# Patient Record
Sex: Male | Born: 1945 | Race: White | Hispanic: No | Marital: Married | Smoking: Former smoker
Health system: Southern US, Community
[De-identification: ages and names within clinical notes are randomized; demographics above are authoritative.]

## PROBLEM LIST (undated history)

## (undated) DIAGNOSIS — I10 Essential (primary) hypertension: Secondary | ICD-10-CM

## (undated) DIAGNOSIS — R399 Unspecified symptoms and signs involving the genitourinary system: Secondary | ICD-10-CM

## (undated) DIAGNOSIS — K219 Gastro-esophageal reflux disease without esophagitis: Secondary | ICD-10-CM

## (undated) DIAGNOSIS — I480 Paroxysmal atrial fibrillation: Secondary | ICD-10-CM

## (undated) DIAGNOSIS — E119 Type 2 diabetes mellitus without complications: Secondary | ICD-10-CM

## (undated) DIAGNOSIS — J439 Emphysema, unspecified: Secondary | ICD-10-CM

## (undated) DIAGNOSIS — G4733 Obstructive sleep apnea (adult) (pediatric): Secondary | ICD-10-CM

## (undated) DIAGNOSIS — G629 Polyneuropathy, unspecified: Secondary | ICD-10-CM

## (undated) DIAGNOSIS — M545 Low back pain, unspecified: Secondary | ICD-10-CM

## (undated) DIAGNOSIS — R0600 Dyspnea, unspecified: Secondary | ICD-10-CM

## (undated) DIAGNOSIS — I872 Venous insufficiency (chronic) (peripheral): Secondary | ICD-10-CM

## (undated) DIAGNOSIS — J189 Pneumonia, unspecified organism: Secondary | ICD-10-CM

## (undated) DIAGNOSIS — R51 Headache: Secondary | ICD-10-CM

## (undated) DIAGNOSIS — Z87442 Personal history of urinary calculi: Secondary | ICD-10-CM

## (undated) DIAGNOSIS — Z973 Presence of spectacles and contact lenses: Secondary | ICD-10-CM

## (undated) DIAGNOSIS — R079 Chest pain, unspecified: Secondary | ICD-10-CM

## (undated) DIAGNOSIS — G8929 Other chronic pain: Secondary | ICD-10-CM

## (undated) DIAGNOSIS — Z972 Presence of dental prosthetic device (complete) (partial): Secondary | ICD-10-CM

## (undated) DIAGNOSIS — Z8739 Personal history of other diseases of the musculoskeletal system and connective tissue: Secondary | ICD-10-CM

## (undated) DIAGNOSIS — Z7901 Long term (current) use of anticoagulants: Secondary | ICD-10-CM

## (undated) DIAGNOSIS — M069 Rheumatoid arthritis, unspecified: Secondary | ICD-10-CM

## (undated) DIAGNOSIS — R519 Headache, unspecified: Secondary | ICD-10-CM

## (undated) DIAGNOSIS — K449 Diaphragmatic hernia without obstruction or gangrene: Secondary | ICD-10-CM

## (undated) DIAGNOSIS — R0609 Other forms of dyspnea: Secondary | ICD-10-CM

## (undated) DIAGNOSIS — R600 Localized edema: Secondary | ICD-10-CM

## (undated) DIAGNOSIS — Z9989 Dependence on other enabling machines and devices: Secondary | ICD-10-CM

## (undated) HISTORY — PX: VEIN LIGATION AND STRIPPING: SHX2653

## (undated) HISTORY — PX: CYST EXCISION: SHX5701

## (undated) HISTORY — PX: TONSILLECTOMY: SUR1361

## (undated) HISTORY — PX: INGUINAL HERNIA REPAIR: SUR1180

## (undated) HISTORY — DX: Chest pain, unspecified: R07.9

## (undated) HISTORY — PX: PLEURAL SCARIFICATION: SHX748

## (undated) HISTORY — PX: UMBILICAL HERNIA REPAIR: SHX196

## (undated) HISTORY — DX: Long term (current) use of anticoagulants: Z79.01

## (undated) HISTORY — PX: HERNIA REPAIR: SHX51

## (undated) HISTORY — DX: Essential (primary) hypertension: I10

## (undated) HISTORY — PX: ATRIAL FIBRILLATION ABLATION: EP1191

## (undated) HISTORY — DX: Paroxysmal atrial fibrillation: I48.0

## (undated) HISTORY — DX: Venous insufficiency (chronic) (peripheral): I87.2

## (undated) HISTORY — DX: Other chronic pain: G89.29

---

## 1996-07-14 HISTORY — PX: CARDIAC CATHETERIZATION: SHX172

## 1999-03-04 ENCOUNTER — Ambulatory Visit (HOSPITAL_COMMUNITY): Admission: RE | Admit: 1999-03-04 | Discharge: 1999-03-04 | Payer: Self-pay | Admitting: Urology

## 1999-03-04 ENCOUNTER — Encounter: Payer: Self-pay | Admitting: Urology

## 1999-07-26 ENCOUNTER — Ambulatory Visit: Admission: RE | Admit: 1999-07-26 | Discharge: 1999-07-26 | Payer: Self-pay | Admitting: Family Medicine

## 1999-10-24 ENCOUNTER — Ambulatory Visit (HOSPITAL_COMMUNITY): Admission: RE | Admit: 1999-10-24 | Discharge: 1999-10-24 | Payer: Self-pay | Admitting: Gastroenterology

## 1999-10-24 ENCOUNTER — Encounter (INDEPENDENT_AMBULATORY_CARE_PROVIDER_SITE_OTHER): Payer: Self-pay | Admitting: Specialist

## 1999-11-20 ENCOUNTER — Encounter: Admission: RE | Admit: 1999-11-20 | Discharge: 1999-11-20 | Payer: Self-pay | Admitting: Vascular Surgery

## 1999-11-20 ENCOUNTER — Encounter: Payer: Self-pay | Admitting: Vascular Surgery

## 2000-02-06 ENCOUNTER — Ambulatory Visit (HOSPITAL_COMMUNITY): Admission: RE | Admit: 2000-02-06 | Discharge: 2000-02-06 | Payer: Self-pay | Admitting: Surgery

## 2000-02-06 ENCOUNTER — Encounter: Payer: Self-pay | Admitting: Surgery

## 2001-09-07 ENCOUNTER — Ambulatory Visit (HOSPITAL_BASED_OUTPATIENT_CLINIC_OR_DEPARTMENT_OTHER): Admission: RE | Admit: 2001-09-07 | Discharge: 2001-09-07 | Payer: Self-pay | Admitting: Surgery

## 2002-02-24 ENCOUNTER — Encounter: Payer: Self-pay | Admitting: Cardiology

## 2002-02-24 ENCOUNTER — Inpatient Hospital Stay (HOSPITAL_COMMUNITY): Admission: EM | Admit: 2002-02-24 | Discharge: 2002-02-26 | Payer: Self-pay

## 2002-07-27 ENCOUNTER — Inpatient Hospital Stay (HOSPITAL_COMMUNITY): Admission: EM | Admit: 2002-07-27 | Discharge: 2002-07-29 | Payer: Self-pay | Admitting: Emergency Medicine

## 2002-10-20 ENCOUNTER — Encounter: Payer: Self-pay | Admitting: Emergency Medicine

## 2002-10-20 ENCOUNTER — Emergency Department (HOSPITAL_COMMUNITY): Admission: EM | Admit: 2002-10-20 | Discharge: 2002-10-20 | Payer: Self-pay | Admitting: Emergency Medicine

## 2002-10-31 ENCOUNTER — Inpatient Hospital Stay (HOSPITAL_COMMUNITY): Admission: EM | Admit: 2002-10-31 | Discharge: 2002-11-01 | Payer: Self-pay | Admitting: Emergency Medicine

## 2002-10-31 ENCOUNTER — Encounter: Payer: Self-pay | Admitting: *Deleted

## 2003-01-05 ENCOUNTER — Ambulatory Visit (HOSPITAL_BASED_OUTPATIENT_CLINIC_OR_DEPARTMENT_OTHER): Admission: RE | Admit: 2003-01-05 | Discharge: 2003-01-05 | Payer: Self-pay | Admitting: Family Medicine

## 2003-03-03 ENCOUNTER — Ambulatory Visit (HOSPITAL_COMMUNITY): Admission: RE | Admit: 2003-03-03 | Discharge: 2003-03-04 | Payer: Self-pay | Admitting: Internal Medicine

## 2003-05-12 ENCOUNTER — Ambulatory Visit (HOSPITAL_COMMUNITY): Admission: RE | Admit: 2003-05-12 | Discharge: 2003-05-13 | Payer: Self-pay | Admitting: Surgery

## 2003-05-12 ENCOUNTER — Encounter (INDEPENDENT_AMBULATORY_CARE_PROVIDER_SITE_OTHER): Payer: Self-pay | Admitting: *Deleted

## 2003-06-23 ENCOUNTER — Inpatient Hospital Stay (HOSPITAL_COMMUNITY): Admission: EM | Admit: 2003-06-23 | Discharge: 2003-06-24 | Payer: Self-pay | Admitting: Emergency Medicine

## 2003-09-26 ENCOUNTER — Encounter: Admission: RE | Admit: 2003-09-26 | Discharge: 2003-12-25 | Payer: Self-pay | Admitting: Family Medicine

## 2004-08-01 ENCOUNTER — Ambulatory Visit (HOSPITAL_COMMUNITY): Admission: RE | Admit: 2004-08-01 | Discharge: 2004-08-01 | Payer: Self-pay | Admitting: Urology

## 2004-08-01 ENCOUNTER — Ambulatory Visit (HOSPITAL_BASED_OUTPATIENT_CLINIC_OR_DEPARTMENT_OTHER): Admission: RE | Admit: 2004-08-01 | Discharge: 2004-08-01 | Payer: Self-pay | Admitting: Urology

## 2004-08-01 ENCOUNTER — Encounter (INDEPENDENT_AMBULATORY_CARE_PROVIDER_SITE_OTHER): Payer: Self-pay | Admitting: *Deleted

## 2004-08-21 ENCOUNTER — Ambulatory Visit: Payer: Self-pay | Admitting: Oncology

## 2004-09-03 ENCOUNTER — Ambulatory Visit (HOSPITAL_COMMUNITY): Admission: RE | Admit: 2004-09-03 | Discharge: 2004-09-03 | Payer: Self-pay | Admitting: Oncology

## 2004-11-29 ENCOUNTER — Ambulatory Visit: Payer: Self-pay | Admitting: Oncology

## 2004-12-20 ENCOUNTER — Ambulatory Visit (HOSPITAL_COMMUNITY): Admission: RE | Admit: 2004-12-20 | Discharge: 2004-12-20 | Payer: Self-pay | Admitting: General Surgery

## 2004-12-20 ENCOUNTER — Encounter (INDEPENDENT_AMBULATORY_CARE_PROVIDER_SITE_OTHER): Payer: Self-pay | Admitting: General Surgery

## 2004-12-20 ENCOUNTER — Encounter (INDEPENDENT_AMBULATORY_CARE_PROVIDER_SITE_OTHER): Payer: Self-pay | Admitting: Specialist

## 2004-12-31 ENCOUNTER — Ambulatory Visit: Payer: Self-pay | Admitting: Internal Medicine

## 2005-03-06 ENCOUNTER — Encounter: Admission: RE | Admit: 2005-03-06 | Discharge: 2005-04-10 | Payer: Self-pay | Admitting: Family Medicine

## 2005-05-15 ENCOUNTER — Encounter: Admission: RE | Admit: 2005-05-15 | Discharge: 2005-05-15 | Payer: Self-pay | Admitting: Cardiology

## 2005-08-07 ENCOUNTER — Ambulatory Visit (HOSPITAL_COMMUNITY): Admission: RE | Admit: 2005-08-07 | Discharge: 2005-08-07 | Payer: Self-pay | Admitting: Cardiology

## 2005-12-24 ENCOUNTER — Ambulatory Visit: Payer: Self-pay | Admitting: Internal Medicine

## 2006-01-27 ENCOUNTER — Emergency Department (HOSPITAL_COMMUNITY): Admission: EM | Admit: 2006-01-27 | Discharge: 2006-01-27 | Payer: Self-pay | Admitting: Emergency Medicine

## 2006-09-09 ENCOUNTER — Ambulatory Visit: Payer: Self-pay | Admitting: Emergency Medicine

## 2006-09-29 ENCOUNTER — Ambulatory Visit (HOSPITAL_BASED_OUTPATIENT_CLINIC_OR_DEPARTMENT_OTHER): Admission: RE | Admit: 2006-09-29 | Discharge: 2006-09-29 | Payer: Self-pay | Admitting: Emergency Medicine

## 2006-10-16 ENCOUNTER — Ambulatory Visit: Payer: Self-pay | Admitting: Pulmonary Disease

## 2006-10-21 ENCOUNTER — Ambulatory Visit: Payer: Self-pay | Admitting: Emergency Medicine

## 2007-02-05 ENCOUNTER — Ambulatory Visit: Payer: Self-pay | Admitting: Vascular Surgery

## 2007-02-16 ENCOUNTER — Ambulatory Visit: Payer: Self-pay | Admitting: Vascular Surgery

## 2007-03-11 ENCOUNTER — Ambulatory Visit: Payer: Self-pay | Admitting: *Deleted

## 2007-03-22 ENCOUNTER — Ambulatory Visit: Payer: Self-pay | Admitting: Vascular Surgery

## 2007-04-21 ENCOUNTER — Ambulatory Visit: Payer: Self-pay | Admitting: Vascular Surgery

## 2007-05-03 ENCOUNTER — Ambulatory Visit: Payer: Self-pay | Admitting: Vascular Surgery

## 2007-05-11 ENCOUNTER — Ambulatory Visit: Payer: Self-pay | Admitting: Vascular Surgery

## 2007-06-21 ENCOUNTER — Ambulatory Visit: Payer: Self-pay | Admitting: Vascular Surgery

## 2007-06-28 ENCOUNTER — Ambulatory Visit: Payer: Self-pay | Admitting: Vascular Surgery

## 2007-10-26 ENCOUNTER — Ambulatory Visit: Payer: Self-pay | Admitting: Vascular Surgery

## 2007-12-01 ENCOUNTER — Encounter: Admission: RE | Admit: 2007-12-01 | Discharge: 2007-12-01 | Payer: Self-pay | Admitting: Sports Medicine

## 2009-01-09 ENCOUNTER — Other Ambulatory Visit: Payer: Self-pay | Admitting: Emergency Medicine

## 2009-01-09 ENCOUNTER — Inpatient Hospital Stay (HOSPITAL_COMMUNITY): Admission: EM | Admit: 2009-01-09 | Discharge: 2009-01-12 | Payer: Self-pay | Admitting: Internal Medicine

## 2010-02-11 ENCOUNTER — Ambulatory Visit: Admission: RE | Admit: 2010-02-11 | Discharge: 2010-02-11 | Payer: Self-pay | Admitting: Cardiology

## 2010-03-19 ENCOUNTER — Ambulatory Visit: Payer: Self-pay | Admitting: Cardiology

## 2010-04-24 ENCOUNTER — Inpatient Hospital Stay (HOSPITAL_COMMUNITY): Admission: EM | Admit: 2010-04-24 | Discharge: 2010-04-27 | Payer: Self-pay | Admitting: Emergency Medicine

## 2010-04-25 ENCOUNTER — Ambulatory Visit: Payer: Self-pay | Admitting: Vascular Surgery

## 2010-06-24 ENCOUNTER — Inpatient Hospital Stay (HOSPITAL_COMMUNITY)
Admission: EM | Admit: 2010-06-24 | Discharge: 2010-06-27 | Payer: Self-pay | Source: Home / Self Care | Attending: Internal Medicine | Admitting: Internal Medicine

## 2010-09-24 LAB — DIFFERENTIAL
Basophils Absolute: 0.1 10*3/uL (ref 0.0–0.1)
Basophils Relative: 0 % (ref 0–1)
Lymphocytes Relative: 11 % — ABNORMAL LOW (ref 12–46)
Neutro Abs: 11.2 10*3/uL — ABNORMAL HIGH (ref 1.7–7.7)
Neutrophils Relative %: 79 % — ABNORMAL HIGH (ref 43–77)

## 2010-09-24 LAB — CBC
HCT: 36.4 % — ABNORMAL LOW (ref 39.0–52.0)
HCT: 39.9 % (ref 39.0–52.0)
Hemoglobin: 12 g/dL — ABNORMAL LOW (ref 13.0–17.0)
Hemoglobin: 13.2 g/dL (ref 13.0–17.0)
Hemoglobin: 13.3 g/dL (ref 13.0–17.0)
MCHC: 33.1 g/dL (ref 30.0–36.0)
MCHC: 33.1 g/dL (ref 30.0–36.0)
RBC: 4.25 MIL/uL (ref 4.22–5.81)
RDW: 14.6 % (ref 11.5–15.5)
RDW: 14.8 % (ref 11.5–15.5)
WBC: 10.7 10*3/uL — ABNORMAL HIGH (ref 4.0–10.5)
WBC: 14.2 10*3/uL — ABNORMAL HIGH (ref 4.0–10.5)

## 2010-09-24 LAB — BASIC METABOLIC PANEL
BUN: 17 mg/dL (ref 6–23)
Calcium: 9.3 mg/dL (ref 8.4–10.5)
Chloride: 99 mEq/L (ref 96–112)
GFR calc Af Amer: >60 mL/min (ref >60)
GFR calc non Af Amer: 56 mL/min — ABNORMAL LOW (ref >60)
GFR calc non Af Amer: >60 mL/min (ref >60)
GFR calc non Af Amer: >60 mL/min (ref >60)
Glucose, Bld: 114 mg/dL — ABNORMAL HIGH (ref 70–99)
Glucose, Bld: 119 mg/dL — ABNORMAL HIGH (ref 70–99)
Potassium: 3.3 mEq/L — ABNORMAL LOW (ref 3.5–5.1)
Potassium: 3.5 mEq/L (ref 3.5–5.1)
Potassium: 3.9 mEq/L (ref 3.5–5.1)
Sodium: 134 mEq/L — ABNORMAL LOW (ref 135–145)
Sodium: 135 mEq/L (ref 135–145)
Sodium: 136 mEq/L (ref 135–145)

## 2010-09-24 LAB — CULTURE, BLOOD (ROUTINE X 2)
Culture  Setup Time: 201112130431
Culture: NO GROWTH
Culture: NO GROWTH

## 2010-09-25 LAB — CBC
HCT: 36.9 % — ABNORMAL LOW (ref 39.0–52.0)
Hemoglobin: 11.2 g/dL — ABNORMAL LOW (ref 13.0–17.0)
Hemoglobin: 13.8 g/dL (ref 13.0–17.0)
MCH: 28.1 pg (ref 26.0–34.0)
MCH: 28.1 pg (ref 26.0–34.0)
MCH: 28.6 pg (ref 26.0–34.0)
MCH: 28.9 pg (ref 26.0–34.0)
MCHC: 32.1 g/dL (ref 30.0–36.0)
MCHC: 33.1 g/dL (ref 30.0–36.0)
MCHC: 33.2 g/dL (ref 30.0–36.0)
MCV: 87 fL (ref 78.0–100.0)
MCV: 85.9 fL (ref 78.0–100.0)
Platelets: 132 10*3/uL — ABNORMAL LOW (ref 150–400)
Platelets: 141 10*3/uL — ABNORMAL LOW (ref 150–400)
Platelets: 157 10*3/uL (ref 150–400)
RBC: 3.99 MIL/uL — ABNORMAL LOW (ref 4.22–5.81)
RBC: 4.22 MIL/uL (ref 4.22–5.81)
RBC: 4.34 MIL/uL (ref 4.22–5.81)
RBC: 4.82 MIL/uL (ref 4.22–5.81)
RDW: 15.1 % (ref 11.5–15.5)
WBC: 31.3 10*3/uL — ABNORMAL HIGH (ref 4.0–10.5)

## 2010-09-25 LAB — BASIC METABOLIC PANEL
BUN: 19 mg/dL (ref 6–23)
CO2: 24 mEq/L (ref 19–32)
CO2: 26 mEq/L (ref 19–32)
CO2: 27 mEq/L (ref 19–32)
Calcium: 8.4 mg/dL (ref 8.4–10.5)
Calcium: 8.9 mg/dL (ref 8.4–10.5)
Chloride: 100 mEq/L (ref 96–112)
Chloride: 102 mEq/L (ref 96–112)
Creatinine, Ser: 1.17 mg/dL (ref 0.4–1.5)
Creatinine, Ser: 1.24 mg/dL (ref 0.4–1.5)
Creatinine, Ser: 1.31 mg/dL (ref 0.4–1.5)
GFR calc Af Amer: >60 mL/min (ref >60)
GFR calc Af Amer: >60 mL/min (ref >60)
GFR calc Af Amer: >60 mL/min (ref >60)
GFR calc non Af Amer: 55 mL/min — ABNORMAL LOW (ref >60)
GFR calc non Af Amer: 59 mL/min — ABNORMAL LOW (ref >60)
Glucose, Bld: 118 mg/dL — ABNORMAL HIGH (ref 70–99)
Potassium: 3.7 mEq/L (ref 3.5–5.1)
Sodium: 132 mEq/L — ABNORMAL LOW (ref 135–145)
Sodium: 134 mEq/L — ABNORMAL LOW (ref 135–145)

## 2010-09-25 LAB — POCT I-STAT, CHEM 8
Calcium, Ion: 1.06 mmol/L — ABNORMAL LOW (ref 1.12–1.32)
Creatinine, Ser: 1.3 mg/dL (ref 0.4–1.5)
Glucose, Bld: 133 mg/dL — ABNORMAL HIGH (ref 70–99)
HCT: 44 % (ref 39.0–52.0)
Hemoglobin: 15 g/dL (ref 13.0–17.0)
TCO2: 24 mmol/L (ref 0–100)

## 2010-09-25 LAB — CULTURE, BLOOD (ROUTINE X 2)
Culture  Setup Time: 201110122158
Culture: NO GROWTH
Culture: NO GROWTH

## 2010-09-25 LAB — GLUCOSE, CAPILLARY
Glucose-Capillary: 107 mg/dL — ABNORMAL HIGH (ref 70–99)
Glucose-Capillary: 110 mg/dL — ABNORMAL HIGH (ref 70–99)
Glucose-Capillary: 129 mg/dL — ABNORMAL HIGH (ref 70–99)
Glucose-Capillary: 151 mg/dL — ABNORMAL HIGH (ref 70–99)
Glucose-Capillary: 98 mg/dL (ref 70–99)

## 2010-09-25 LAB — DIFFERENTIAL
Eosinophils Absolute: 0 10*3/uL (ref 0.0–0.7)
Eosinophils Relative: 0 % (ref 0–5)
Lymphs Abs: 1 10*3/uL (ref 0.7–4.0)
Monocytes Relative: 4 % (ref 3–12)

## 2010-10-03 ENCOUNTER — Telehealth: Payer: Self-pay | Admitting: Cardiology

## 2010-10-03 ENCOUNTER — Encounter: Payer: Self-pay | Admitting: Cardiology

## 2010-10-03 NOTE — Telephone Encounter (Signed)
Patient called and said he is not feeling right.  Will call back in 1hour

## 2010-10-04 ENCOUNTER — Ambulatory Visit (INDEPENDENT_AMBULATORY_CARE_PROVIDER_SITE_OTHER): Payer: BC Managed Care – PPO | Admitting: Nurse Practitioner

## 2010-10-04 ENCOUNTER — Encounter: Payer: Self-pay | Admitting: Nurse Practitioner

## 2010-10-04 DIAGNOSIS — G8929 Other chronic pain: Secondary | ICD-10-CM | POA: Insufficient documentation

## 2010-10-04 DIAGNOSIS — R0782 Intercostal pain: Secondary | ICD-10-CM | POA: Insufficient documentation

## 2010-10-04 DIAGNOSIS — E669 Obesity, unspecified: Secondary | ICD-10-CM

## 2010-10-04 DIAGNOSIS — Z7901 Long term (current) use of anticoagulants: Secondary | ICD-10-CM

## 2010-10-04 DIAGNOSIS — R0600 Dyspnea, unspecified: Secondary | ICD-10-CM

## 2010-10-04 DIAGNOSIS — I480 Paroxysmal atrial fibrillation: Secondary | ICD-10-CM | POA: Insufficient documentation

## 2010-10-04 DIAGNOSIS — R079 Chest pain, unspecified: Secondary | ICD-10-CM

## 2010-10-04 DIAGNOSIS — I4891 Unspecified atrial fibrillation: Secondary | ICD-10-CM

## 2010-10-04 DIAGNOSIS — R0609 Other forms of dyspnea: Secondary | ICD-10-CM

## 2010-10-04 DIAGNOSIS — Z79899 Other long term (current) drug therapy: Secondary | ICD-10-CM

## 2010-10-04 LAB — COMPREHENSIVE METABOLIC PANEL
ALT: 27 U/L (ref 0–53)
AST: 29 U/L (ref 0–37)
Albumin: 4.1 g/dL (ref 3.5–5.2)
Alkaline Phosphatase: 66 U/L (ref 39–117)
BUN: 16 mg/dL (ref 6–23)
CO2: 23 mEq/L (ref 19–32)
Calcium: 9.4 mg/dL (ref 8.4–10.5)
Chloride: 102 mEq/L (ref 96–112)
Creat: 1.04 mg/dL (ref 0.40–1.50)
Glucose, Bld: 112 mg/dL — ABNORMAL HIGH (ref 70–99)
Potassium: 3.6 mEq/L (ref 3.5–5.3)
Sodium: 139 mEq/L (ref 135–145)
Total Bilirubin: 0.5 mg/dL (ref 0.3–1.2)
Total Protein: 8.7 g/dL — ABNORMAL HIGH (ref 6.0–8.3)

## 2010-10-04 LAB — CBC
HCT: 43 % (ref 39.0–52.0)
Hemoglobin: 13.9 g/dL (ref 13.0–17.0)
MCH: 28.4 pg (ref 26.0–34.0)
MCHC: 32.3 g/dL (ref 30.0–36.0)
MCV: 87.8 fL (ref 78.0–100.0)
Platelets: 185 10*3/uL (ref 150–400)
RBC: 4.9 MIL/uL (ref 4.22–5.81)
RDW: 15.2 % (ref 11.5–15.5)
WBC: 8.4 10*3/uL (ref 4.0–10.5)

## 2010-10-04 LAB — TSH: TSH: 2.659 u[IU]/mL (ref 0.350–4.500)

## 2010-10-04 MED ORDER — AMIODARONE HCL 400 MG PO TABS
200.0000 mg | ORAL_TABLET | Freq: Every day | ORAL | Status: DC
Start: 2010-10-04 — End: 2010-10-30

## 2010-10-04 NOTE — Progress Notes (Signed)
History of Present Illness: [name] is seen today for a work in visit. He is seen for Dr. Swaziland. He has had chest discomfort for the past 2 weeks. He first says it would tend to come and go, but now states it has been constant. He is fatigued and lightheaded. He has not passed out. He did not keep his last appointment. In September, he was loaded with amiodarone and switched to Pradaxa. He has a little tremor. He does not check his blood pressure at home. He has chronic shortness of breath. He requires chronic diuretic therapy. He has been treated in the hospital for cellulitis of his legs since he was last seen here. He notes that the amiodarone has done a much better job with controlling his atrial fib. He has had very little breakthrough. He has been taking his Pradaxa.   Current Outpatient Prescriptions on File Prior to Visit  Medication Sig Dispense Refill  . amiodarone (PACERONE) 400 MG tablet Take 400 mg by mouth daily.        Marland Kitchen atenolol (TENORMIN) 50 MG tablet Take 50 mg by mouth 2 (two) times daily.        . dabigatran (PRADAXA) 150 MG CAPS Take 150 mg by mouth every 12 (twelve) hours.        Marland Kitchen diltiazem (CARDIZEM CD) 180 MG 24 hr capsule Take 180 mg by mouth daily.        Marland Kitchen esomeprazole (NEXIUM) 40 MG capsule Take 40 mg by mouth daily.        . furosemide (LASIX) 40 MG tablet Take 40 mg by mouth daily.        . hydrochlorothiazide 25 MG tablet Take 25 mg by mouth daily.        Marland Kitchen LORazepam (ATIVAN) 1 MG tablet Take 1 mg by mouth as needed.        . potassium chloride SA (K-DUR,KLOR-CON) 20 MEQ tablet Take 20 mEq by mouth 2 (two) times daily.        Marland Kitchen spironolactone (ALDACTONE) 25 MG tablet Take 25 mg by mouth daily.        . Albuterol Sulfate (PROAIR HFA IN) Inhale into the lungs as needed.        . Fluticasone-Salmeterol (ADVAIR HFA IN) Inhale into the lungs as needed.          No Known Allergies  Past Medical History  Diagnosis Date  . PAF (paroxysmal atrial fibrillation)   . Chronic  anticoagulation   . HTN (hypertension)   . Chronic chest pain   . Obesity   . Chronic back pain   . Emphysema   . Chronic venous insufficiency   . Pneumothorax     Past Surgical History  Procedure Date  . Cardiac catheterization 1998    NORMAL  . Hernia repair   . Vein ligation and stripping   . Pleural scarification     History  Smoking status  . Former Smoker -- 3.0 packs/day  . Types: Cigarettes  . Quit date: 03/14/1996  Smokeless tobacco  . Not on file    History  Alcohol Use  . Yes    one beer per month    Family History  Problem Relation Age of Onset  . Other Father     CEREBRAL THROMBOSIS  . Cancer Mother     bone cancer  . Heart failure Brother 50  . Coronary artery disease Brother 41    PTCA    Review of Systems: He  has chest tightness that is midsternal. He has had some sharp chest pain. He is chronically short of breath. He is not coughing. He is lightheaded regardless of his position. No syncope.  All other systems were reviewed and are negative.  Physical Exam: BP 144/76  Pulse 57  Ht 6\' 8"  (2.032 m)  Wt 349 lb 6.4 oz (158.487 kg)  BMI 38.38 kg/m2 He is a pleasant, obese white male. He is in no acute distress. HEENT is unremarkable.Neck is supple. No masses.  Lungs are clear. Cardiac exam shows a regular rate and rhythm. Abdomen is obese. Extremities are full. No significant edema. Gait and ROM are intact. He has no gross neurologic deficits.   ECG:  Sinus bradycardia with a long first degree AV block. Tracing is reviewed with Dr. Swaziland.  Assessment / Plan:

## 2010-10-04 NOTE — Assessment & Plan Note (Signed)
He remains on high dose amiodarone. We will need to cut back and check his follow up labs today.

## 2010-10-04 NOTE — Assessment & Plan Note (Signed)
He remains on Pradaxa.

## 2010-10-04 NOTE — Progress Notes (Signed)
Addended by: Carman Ching on: 10/04/2010 12:55 PM   Modules accepted: Orders

## 2010-10-04 NOTE — Assessment & Plan Note (Signed)
We will arrange for stress testing. He did have a negative cardiac CT in 2007.

## 2010-10-04 NOTE — Patient Instructions (Signed)
We are going to cut back on your amiodarone. You are going to take only one tablet per day. We will check labs today. We are going to arrange for a stress test next week. You will see Dr. Swaziland a few days after your stress test for review.

## 2010-10-04 NOTE — Assessment & Plan Note (Signed)
Probably multifactorial. Will check BNP. He is scheduled for a Lexiscan.

## 2010-10-05 LAB — BRAIN NATRIURETIC PEPTIDE: Brain Natriuretic Peptide: 80.9 pg/mL (ref 0.0–100.0)

## 2010-10-07 ENCOUNTER — Telehealth: Payer: Self-pay | Admitting: *Deleted

## 2010-10-07 NOTE — Telephone Encounter (Signed)
LM w/ lab results 

## 2010-10-20 LAB — CBC
Hemoglobin: 12.9 g/dL — ABNORMAL LOW (ref 13.0–17.0)
MCHC: 33.7 g/dL (ref 30.0–36.0)
MCV: 85.3 fL (ref 78.0–100.0)
Platelets: 133 10*3/uL — ABNORMAL LOW (ref 150–400)
RBC: 4.42 MIL/uL (ref 4.22–5.81)
RBC: 5.03 MIL/uL (ref 4.22–5.81)
WBC: 10.9 10*3/uL — ABNORMAL HIGH (ref 4.0–10.5)
WBC: 8.4 10*3/uL (ref 4.0–10.5)

## 2010-10-20 LAB — BASIC METABOLIC PANEL
BUN: 11 mg/dL (ref 6–23)
CO2: 24 mEq/L (ref 19–32)
Chloride: 101 mEq/L (ref 96–112)
Glucose, Bld: 104 mg/dL — ABNORMAL HIGH (ref 70–99)
Potassium: 3.7 mEq/L (ref 3.5–5.1)

## 2010-10-20 LAB — PROTIME-INR
INR: 1.7 — ABNORMAL HIGH (ref 0.00–1.49)
INR: 1.9 — ABNORMAL HIGH (ref 0.00–1.49)

## 2010-10-21 ENCOUNTER — Ambulatory Visit (HOSPITAL_COMMUNITY): Payer: BC Managed Care – PPO | Attending: Cardiology | Admitting: Radiology

## 2010-10-21 VITALS — BP 128/56 | HR 54 | Ht >= 80 in | Wt 246.0 lb

## 2010-10-21 DIAGNOSIS — R079 Chest pain, unspecified: Secondary | ICD-10-CM | POA: Insufficient documentation

## 2010-10-21 DIAGNOSIS — I4891 Unspecified atrial fibrillation: Secondary | ICD-10-CM | POA: Insufficient documentation

## 2010-10-21 DIAGNOSIS — R0602 Shortness of breath: Secondary | ICD-10-CM

## 2010-10-21 DIAGNOSIS — R0789 Other chest pain: Secondary | ICD-10-CM

## 2010-10-21 DIAGNOSIS — G8929 Other chronic pain: Secondary | ICD-10-CM

## 2010-10-21 LAB — COMPREHENSIVE METABOLIC PANEL
Alkaline Phosphatase: 54 U/L (ref 39–117)
BUN: 16 mg/dL (ref 6–23)
CO2: 28 mEq/L (ref 19–32)
Chloride: 99 mEq/L (ref 96–112)
Creatinine, Ser: 1.03 mg/dL (ref 0.4–1.5)
GFR calc non Af Amer: >60 mL/min (ref >60)
Glucose, Bld: 114 mg/dL — ABNORMAL HIGH (ref 70–99)
Potassium: 2.9 mEq/L — ABNORMAL LOW (ref 3.5–5.1)
Total Bilirubin: 1.6 mg/dL — ABNORMAL HIGH (ref 0.3–1.2)

## 2010-10-21 LAB — CBC
HCT: 40.3 % (ref 39.0–52.0)
Hemoglobin: 13.3 g/dL (ref 13.0–17.0)
Hemoglobin: 13.9 g/dL (ref 13.0–17.0)
MCHC: 33.4 g/dL (ref 30.0–36.0)
MCV: 85.7 fL (ref 78.0–100.0)
MCV: 84.7 fL (ref 78.0–100.0)
RBC: 4.7 MIL/uL (ref 4.22–5.81)
RBC: 4.92 MIL/uL (ref 4.22–5.81)
WBC: 9.3 10*3/uL (ref 4.0–10.5)
WBC: 12.2 10*3/uL — ABNORMAL HIGH (ref 4.0–10.5)

## 2010-10-21 LAB — BASIC METABOLIC PANEL
CO2: 27 mEq/L (ref 19–32)
Calcium: 9 mg/dL (ref 8.4–10.5)
Chloride: 100 mEq/L (ref 96–112)
Creatinine, Ser: 1.2 mg/dL (ref 0.4–1.5)
GFR calc Af Amer: >60 mL/min (ref >60)
Sodium: 137 mEq/L (ref 135–145)

## 2010-10-21 LAB — DIFFERENTIAL
Lymphs Abs: 1.6 10*3/uL (ref 0.7–4.0)
Monocytes Absolute: 0.9 10*3/uL (ref 0.1–1.0)
Monocytes Relative: 7 % (ref 3–12)
Neutro Abs: 9.4 10*3/uL — ABNORMAL HIGH (ref 1.7–7.7)
Neutrophils Relative %: 77 % (ref 43–77)

## 2010-10-21 LAB — PROTIME-INR
INR: 1.6 — ABNORMAL HIGH (ref 0.00–1.49)
Prothrombin Time: 20.2 seconds — ABNORMAL HIGH (ref 11.6–15.2)

## 2010-10-21 LAB — CULTURE, BLOOD (ROUTINE X 2): Culture: NO GROWTH

## 2010-10-21 LAB — HEMOGLOBIN A1C: Mean Plasma Glucose: 123 mg/dL

## 2010-10-21 MED ORDER — REGADENOSON 0.4 MG/5ML IV SOLN: 0.4000 mg | Freq: Once | INTRAVENOUS | Status: DC

## 2010-10-21 MED ORDER — TECHNETIUM TC 99M TETROFOSMIN IV KIT
33.0000 | PACK | Freq: Once | INTRAVENOUS | Status: AC | PRN
  Administered 2010-10-21: 33 via INTRAVENOUS

## 2010-10-21 NOTE — Progress Notes (Signed)
[facility] SITE 3 NUCLEAR MED [address] [place] [address] [phone]  Cardiology Nuclear Med Study  [name] is a 65 y.o. male [id number] 26-Feb-1946   Nuclear Med Background Indication for Stress Test:  Evaluation for Ischemia History: '98 Heart Cath NL, '03 ECHO EF 55-60% Cardiac Risk Factors: Family History - CAD, History of Smoking and Hypertension  Symptoms:  Chest Tightness (last date of chest discomfort 10/21/10), Fatigue, Light-Headedness and SOB   Nuclear Pre-Procedure Caffeine/Decaff Intake:  None NPO After: 9:00pm   Lungs: clear IV 0.9% NS with Angio Cath:  20g  IV Site: L Hand  IV Started by:  Milana Na, EMT-P  Chest Size (in): 52 Cup Size: n/a  Height: 6\' 8"  (2.032 m)  Weight:  346 lb (111.585 kg)  BMI:  Body mass index is 27.02 kg/(m^2). Tech Comments:  n/a    Nuclear Med Study 1 or 2 day study: 2 day  Stress Test Type:  Lexiscan  Reading MD: Cassell Clement, MD  Order Authorizing Provider:  T.Brackbill  Resting Radionuclide: Technetium 61m Tetrofosmin  Resting Radionuclide Dose: 33 mCi   Stress Radionuclide:  Technetium 66m Tetrofosmin  Stress Radionuclide Dose: 33 mCi           Stress Protocol Rest HR: 54 Stress HR: 63  Rest BP: 128/56 Stress BP: 141/39  Exercise Time (min): n/a METS: n/a   Predicted Max HR: 156 bpm % Max HR: 40.38 bpm Rate Pressure Product: 8883   Dose of Adenosine (mg):  n/a Dose of Lexiscan: 0.4 mg  Dose of Atropine (mg): n/a Dose of Dobutamine: n/a mcg/kg/min (at max HR)  Stress Test Technologist: Milana Na, EMT-P  Nuclear Technologist:  Doyne Keel, CNMT     Rest Procedure:  Myocardial perfusion imaging was performed at rest 45 minutes following the intravenous administration of Technetium 79m Tetrofosmin. Rest ECG: Sinus Bradycardia with 1Degree AVB  Stress Procedure:  The patient received IV Lexiscan 0.4 mg over 15-seconds.  Technetium 79m Tetrofosmin injected at 30-seconds.   There were no significant changes with Lexiscan.  Quantitative spect images were obtained after a 45 minute delay. Stress ECG: No significant change from baseline ECG  QPS Raw Data Images:  Normal; no motion artifact; normal heart/lung ratio. Stress Images:  Normal homogeneous uptake in all areas of the myocardium. Rest Images:  Normal homogeneous uptake in all areas of the myocardium. Subtraction (SDS):  Normal Transient Ischemic Dilatation (Normal <1.22):  0.97 Lung/Heart Ratio (Normal <0.45):  0.36  Quantitative Gated Spect Images QGS EDV:  198 ml QGS ESV:  72 ml QGS cine images:  NL LV Function; NL Wall Motion QGS EF: 64%  Impression Exercise Capacity:  Lexiscan with no exercise. BP Response:  Normal blood pressure response. Clinical Symptoms:  Atypical chest pain. ECG Impression:  No significant ST segment change suggestive of ischemia. Comparison with Prior Nuclear Study: No images to compare  Overall Impression:  Normal stress nuclear study.  Images suggest cardiomegaly       Charlton Haws

## 2010-10-22 ENCOUNTER — Ambulatory Visit (HOSPITAL_COMMUNITY): Payer: BC Managed Care – PPO | Attending: Cardiology | Admitting: Radiology

## 2010-10-22 DIAGNOSIS — R0602 Shortness of breath: Secondary | ICD-10-CM

## 2010-10-22 DIAGNOSIS — R0789 Other chest pain: Secondary | ICD-10-CM

## 2010-10-22 MED ORDER — TECHNETIUM TC 99M TETROFOSMIN IV KIT
33.0000 | PACK | Freq: Once | INTRAVENOUS | Status: AC | PRN
  Administered 2010-10-22: 33 via INTRAVENOUS

## 2010-10-22 NOTE — Progress Notes (Deleted)
ROUTED TO DR. BRACKBILL.[name] ° ° °

## 2010-10-23 NOTE — Progress Notes (Signed)
NUCLEAR MED. SENT TO DR. Patty Sermons. [name]

## 2010-10-24 ENCOUNTER — Encounter (HOSPITAL_COMMUNITY): Payer: Self-pay | Admitting: Cardiology

## 2010-10-28 ENCOUNTER — Other Ambulatory Visit: Payer: Self-pay | Admitting: *Deleted

## 2010-10-28 DIAGNOSIS — Z79899 Other long term (current) drug therapy: Secondary | ICD-10-CM

## 2010-10-28 NOTE — Progress Notes (Signed)
COPY ROUTED TO DR. Swaziland PER ANITA.[name]

## 2010-10-29 ENCOUNTER — Telehealth: Payer: Self-pay | Admitting: *Deleted

## 2010-10-29 NOTE — Telephone Encounter (Signed)
LM w/ nuclear results; reminded him he has an app 4/18 @ 4:15

## 2010-10-30 ENCOUNTER — Other Ambulatory Visit (INDEPENDENT_AMBULATORY_CARE_PROVIDER_SITE_OTHER): Payer: BC Managed Care – PPO | Admitting: *Deleted

## 2010-10-30 ENCOUNTER — Encounter: Payer: Self-pay | Admitting: Cardiology

## 2010-10-30 ENCOUNTER — Ambulatory Visit (INDEPENDENT_AMBULATORY_CARE_PROVIDER_SITE_OTHER): Payer: BC Managed Care – PPO | Admitting: Cardiology

## 2010-10-30 VITALS — BP 128/68 | HR 62 | Ht >= 80 in | Wt 357.4 lb

## 2010-10-30 DIAGNOSIS — R0600 Dyspnea, unspecified: Secondary | ICD-10-CM

## 2010-10-30 DIAGNOSIS — R079 Chest pain, unspecified: Secondary | ICD-10-CM

## 2010-10-30 DIAGNOSIS — I4891 Unspecified atrial fibrillation: Secondary | ICD-10-CM

## 2010-10-30 DIAGNOSIS — Z79899 Other long term (current) drug therapy: Secondary | ICD-10-CM

## 2010-10-30 DIAGNOSIS — I480 Paroxysmal atrial fibrillation: Secondary | ICD-10-CM

## 2010-10-30 DIAGNOSIS — R0609 Other forms of dyspnea: Secondary | ICD-10-CM

## 2010-10-30 DIAGNOSIS — Z7901 Long term (current) use of anticoagulants: Secondary | ICD-10-CM

## 2010-10-30 NOTE — Patient Instructions (Signed)
We will schedule you for pulmonary function tests to evaluate your shortness of breath.  We may need to have you see a lung specialist depending on this result.

## 2010-10-30 NOTE — Assessment & Plan Note (Signed)
Appears to be under good control on amiodarone 200 mg daily. We will continue on his current dose pending the results of his pulmonary function studies.

## 2010-10-30 NOTE — Progress Notes (Signed)
History of Present Illness: [name] is seen today 4 follow up. He continues to complain of shortness of breath. He states he has difficulty walking 50 feet without becoming short of breath. He denies any significant wheezing but occasionally feels that something is hanging up in his throat and causes him to have a hacking cough. He denies any significant palpitations in the past has been able to tell when he is in atrial fibrillation. In fact he feels that his palpitations are better than before he was on amiodarone. He has not been using any inhalers. Of note he was started on amiodarone in September of 2011 and had his dose reduced to 200 mg daily this past month. He has had no significant chest pain.   Current Outpatient Prescriptions on File Prior to Visit  Medication Sig Dispense Refill  . Albuterol Sulfate (PROAIR HFA IN) Inhale into the lungs as needed.       Marland Kitchen atenolol (TENORMIN) 50 MG tablet Take 50 mg by mouth 2 (two) times daily.       . dabigatran (PRADAXA) 150 MG CAPS Take 150 mg by mouth every 12 (twelve) hours.        Marland Kitchen diltiazem (CARDIZEM CD) 180 MG 24 hr capsule Take 180 mg by mouth daily.        Marland Kitchen esomeprazole (NEXIUM) 40 MG capsule Take 40 mg by mouth daily.        . furosemide (LASIX) 40 MG tablet Take 40 mg by mouth daily.        . hydrochlorothiazide 25 MG tablet Take 25 mg by mouth daily.        Marland Kitchen LORazepam (ATIVAN) 1 MG tablet Take 1 mg by mouth as needed.        . potassium chloride SA (K-DUR,KLOR-CON) 20 MEQ tablet Take 20 mEq by mouth 2 (two) times daily.        Marland Kitchen spironolactone (ALDACTONE) 25 MG tablet Take 25 mg by mouth daily.        Marland Kitchen DISCONTD: amiodarone (PACERONE) 400 MG tablet Take 0.5 tablets (200 mg total) by mouth daily.  30 tablet  3  . Fluticasone-Salmeterol (ADVAIR HFA IN) Inhale into the lungs as needed.          No Known Allergies  Past Medical History  Diagnosis Date  . PAF (paroxysmal atrial fibrillation)   . Chronic anticoagulation   . HTN  (hypertension)   . Chronic chest pain   . Obesity   . Chronic back pain   . Emphysema   . Chronic venous insufficiency   . Pneumothorax     Past Surgical History  Procedure Date  . Cardiac catheterization 1998    NORMAL  . Hernia repair   . Vein ligation and stripping   . Pleural scarification     History  Smoking status  . Former Smoker -- 3.0 packs/day  . Types: Cigarettes  . Quit date: 03/14/1996  Smokeless tobacco  . Not on file    History  Alcohol Use  . Yes    one beer per month    Family History  Problem Relation Age of Onset  . Other Father     CEREBRAL THROMBOSIS  . Cancer Mother     bone cancer  . Heart failure Brother 50  . Coronary artery disease Brother 55    PTCA    Review of Systems:  He has had some sharp chest pain. He is chronically short of breath. He is not coughing.  He is lightheaded regardless of his position. No syncope.  All other systems were reviewed and are negative.  Physical Exam: BP 128/68  Pulse 62  Ht 6\' 8"  (2.032 m)  Wt 357 lb 6.4 oz (162.116 kg)  BMI 39.26 kg/m2 He is a pleasant, obese white male. He is in no acute distress. HEENT is unremarkable.Neck is supple. No masses.  Lungs are clear. Cardiac exam shows a regular rate and rhythm. Abdomen is obese. Extremities are full. He has chronic venous stasis disease with chronic brawny edema. Gait and ROM are intact. He has no gross neurologic deficits.   Laboratory data:recent CBC, complete chemistry panel, TSH were normal. BNP was 81. Lexiscan Myoview study was normal with an ejection fraction of 64%.  Assessment / Plan:

## 2010-10-30 NOTE — Assessment & Plan Note (Signed)
Nuclear stress test was normal. This is consistent with his prior cardiac catheterization and coronary CT angiogram results in the past. Chest pain has resolved. His predominant symptom now is dyspnea.

## 2010-10-30 NOTE — Assessment & Plan Note (Signed)
-  Continue Pradaxa 

## 2010-10-30 NOTE — Assessment & Plan Note (Signed)
This is probably multifactorial. He is overweight. He is deconditioned. He does have a history of emphysema. He doesn't feel that his inhalers have benefited him in the past. We need to make sure he is not experiencing amiodarone toxicity. We will repeat his pulmonary function studies with diffusion capacity and compare this with his last study in August of 2011. If this is unchanged we will need to refer him back to pulmonary. He has seen Dr. Delton Coombes in the past. There is no evidence of congestive heart failure or ischemia at this time. He appears to be maintaining sinus rhythm well.

## 2010-10-31 LAB — HEPATIC FUNCTION PANEL
Alkaline Phosphatase: 61 U/L (ref 39–117)
Bilirubin, Direct: 0.1 mg/dL (ref 0.0–0.3)
Total Bilirubin: 0.3 mg/dL (ref 0.3–1.2)

## 2010-10-31 LAB — BASIC METABOLIC PANEL
Calcium: 9.2 mg/dL (ref 8.4–10.5)
Creatinine, Ser: 1.3 mg/dL (ref 0.4–1.5)
GFR: 60.01 mL/min (ref >60.00)
Sodium: 139 mEq/L (ref 135–145)

## 2010-11-01 ENCOUNTER — Telehealth: Payer: Self-pay | Admitting: *Deleted

## 2010-11-01 NOTE — Telephone Encounter (Signed)
Message copied by Murrell Redden on Fri Nov 01, 2010  3:00 PM ------      Message from: [place], PETER      Created: Fri Nov 01, 2010 12:23 PM       Bmet and lfts all ok except mildly elevated glucose.

## 2010-11-01 NOTE — Telephone Encounter (Signed)
Lm w/ lab results. Sent to Dr. Tenny Craw

## 2010-11-05 NOTE — Progress Notes (Signed)
Reported by [place]

## 2010-11-06 ENCOUNTER — Ambulatory Visit (HOSPITAL_COMMUNITY)
Admission: RE | Admit: 2010-11-06 | Discharge: 2010-11-06 | Disposition: A | Discharge status: Home / Self Care | Payer: BC Managed Care – PPO | Source: Ambulatory Visit | Attending: Cardiology | Admitting: Cardiology

## 2010-11-06 DIAGNOSIS — R0609 Other forms of dyspnea: Secondary | ICD-10-CM | POA: Insufficient documentation

## 2010-11-06 DIAGNOSIS — R0600 Dyspnea, unspecified: Secondary | ICD-10-CM

## 2010-11-06 DIAGNOSIS — R0989 Other specified symptoms and signs involving the circulatory and respiratory systems: Secondary | ICD-10-CM | POA: Insufficient documentation

## 2010-11-19 ENCOUNTER — Telehealth: Payer: Self-pay | Admitting: *Deleted

## 2010-11-19 NOTE — Telephone Encounter (Signed)
LM w/results of PFT's. Advised that results are worse and Dr. Swaziland wants him to see Dr, Delton Coombes (seen in '08). Made an app w/ him for 5/24 (thurs) at 10:15.

## 2010-11-25 ENCOUNTER — Other Ambulatory Visit: Payer: Self-pay | Admitting: Cardiology

## 2010-11-25 NOTE — Telephone Encounter (Signed)
escribe medication per fax request  

## 2010-11-26 NOTE — H&P (Signed)
[name], [name]                   ACCOUNT NO.:  [id number]   MEDICAL RECORD NO.:  [id number]          PATIENT TYPE:  INP   LOCATION:  3741                         FACILITY:  [facility]   PHYSICIAN:  Michiel Cowboy, MDDATE OF BIRTH:  1946-06-11   DATE OF ADMISSION:  01/10/2009  DATE OF DISCHARGE:                              HISTORY & PHYSICAL   PRIMARY CARE PHYSICIAN:  Vikki Ports, M.D.   CHIEF COMPLAINT:  Right leg swelling.   HISTORY OF PRESENT ILLNESS:  The patient is a pleasant 65 year old  gentleman with history of paroxysmal atrial fibrillation, obesity and  hypertension with long-standing renal insufficiency bilaterally with  chronic edema.  The patient, since yesterday, started to have right leg  redness, swelling and pain as well as fever and headaches.  He presented  to [facility] ED with diagnosis of cellulitis and was a direct admit  from [facility] ED to [facility].  The patient stated that he  had this before.  He, otherwise, had been constipated for the past  couple of days.  He had been having some nausea but no vomiting and just  generalized malaise and not feeling well for the past few days.  Otherwise, no vomiting, no chest pain, no significant shortness of  breath about his baseline.  He does have usual mild shortness of breath.  He has not had headaches, and they are particularly worse with strain  for the past few days.  Otherwise, review of systems, unremarkable.   PAST MEDICAL HISTORY:  1. Atrial flutter status post radiofrequency catheter ablation.  2. Paroxysmal atrial fibrillation.  3. Obesity.  4. Hypertension.  5. History of bolus emphysema.  6. History of chronic back pain.  7. History of remote pneumothorax.  8. History of sleep apnea.  9. Venous insufficiency status post vein stripping.   SOCIAL HISTORY:  The patient currently does not smoke.  Drinks  occasional alcohol but not on a regular basis.  Does not abuse drugs.   FAMILY HISTORY:  Significant for coronary artery disease and COPD.  One  brother had passed away from his coronary artery disease in his 33s.   ALLERGIES:  No known drug allergies.   MEDICATIONS:  The patient does not know which medications he takes.  He  thinks it is a lot of different medications, but he cannot recollect.  I  have a medication list from 2008, according to which he was taking:  1. Nexium 40 mg daily.  2. Atenolol 50 mg p.o. b.i.d.  3. Potassium p.r.n.  4. Rythmol 300 mg b.i.d.  5. Lasix 40 mg daily.  6. Cardia XT 180 mg daily.  7. Spironolactone 25 mg daily.  8. Flecainide 50 mg b.i.d.  9. Advair 250/50 inhalation b.i.d.  10.Ativan as needed.  11.Percocet as needed.  12.Oxycodone as needed.   The patient states that his cardiologist, Dr. Swaziland, may have an  accurate list, and perhaps there is someone at his home who may know it,  but he cannot tell me whatsoever.  He does know he takes Coumadin  now.   PHYSICAL EXAMINATION:  VITAL SIGNS:  Temperature 100.1, blood pressure  105/53, pulse 67, respirations 20, sating 100% on room air.  GENERAL:  The patient currently appears to be in no acute distress.  HEENT:  Head nontraumatic.  Somewhat dry mucous membranes.  Decreased  skin turgor.  HEART:  Regular rate and rhythm.  No murmurs appreciated.  LUNGS:  Clear to auscultation bilaterally with just mild crackles at the  bases.  ABDOMEN:  Soft.  There is slight diffuse tenderness which she attributes  to being very constipated.  EXTREMITIES:  Lower extremities with bilateral trace edema with right  extremity demarcated erythema and warmth.  Chronic venous stasis changes  present bilaterally.  NEUROLOGICAL:  The patient is intact.   LABORATORY DATA:  White blood cell count 12.2, hemoglobin 13.9, sodium  137, potassium 3.5, creatinine 1.2.  EKG showed normal sinus rhythm with  occasional PVCs.   ASSESSMENT/PLAN:  1. This is a 65 year old gentleman with past  medical history      significant for atrial fibrillation and venous insufficiency,      presents with cellulitis.  Blood cultures have been obtained      already.  Will cover with vancomycin for now.  Elevate right leg      and continue to monitor.  Hopefully, he will be able to be      transitioned to p.o. in July and in the near future.  2. Atrial fibrillation.  Currently in sinus rhythm.  I do not know      what medications he takes.  Given the slight soft blood pressures,      will hold his spironolactone, Cardia and Atenolol for now anyway.      Will continue flecainide, metoprolol, holding parameters.  Would      break through tachycardia.  It would be very strongly recommended      to obtain and accurate medication list from either his family in      the morning or his primary care [name].  I am unfortunately      unable to do so at night.  Will continue Coumadin for now.  3. History of emphysema.  For COPD continue Advair.  4. Headache.  Likely related to his fever and dehydration, but will      check CT scan of his head, especially since he is on Coumadin to      make sure I am not missing something.  5. Prophylaxis, Protonix as the patient is already on Coumadin.  6. Constipation.  I will treat the above regimen.     Michiel Cowboy, MD  Electronically Signed    AVD/MEDQ  D:  01/10/2009  T:  01/10/2009  Job:  [id number]   cc:   Vikki Ports, M.D.

## 2010-11-26 NOTE — Procedures (Signed)
DUPLEX DEEP VENOUS EXAM - LOWER EXTREMITY   INDICATION:  Right leg pain and swelling.   HISTORY:  Edema:  Yes  Trauma/Surgery:  No  Pain:  Yes  PE:  No  Previous DVT:  No   DUPLEX EXAM:                CFV   SFV   PopV  PTV    GSV                R  L  R  L  R  L  R   L  R  L  Thrombosis    0  0  0     0     0      0  +  Spontaneous   +  +  +  +  +  +  +   +  +  +  Phasic        +  +  +     +     +   +  +  Augmentation  +  +  +     +     +      +  Compressible  +  +  +     +     +      +  Competent     +  +  +     +     +      +   Legend:  + - yes  o - no  p - partial  D - decreased   IMPRESSION:  No evidence of DVT noted in the right leg.    _____________________________  [name], M.D.   MG/MEDQ  D:  03/11/2007  T:  03/12/2007  Job:  [id number]

## 2010-11-26 NOTE — Discharge Summary (Signed)
[name], [name]                   ACCOUNT NO.:  [id number]   MEDICAL RECORD NO.:  [id number]          PATIENT TYPE:  INP   LOCATION:  3741                         FACILITY:  [facility]   PHYSICIAN:  Hollice Espy, M.D.DATE OF BIRTH:  1945-07-26   DATE OF ADMISSION:  01/09/2009  DATE OF DISCHARGE:  01/12/2009                               DISCHARGE SUMMARY   ATTENDING PHYSICIAN:  Hollice Espy, M.D.   PRIMARY CARE PHYSICIAN:  Vikki Ports, M.D.   CARDIOLOGIST:  Peter M. Swaziland, M.D.   DISCHARGE DIAGNOSES:  1. Cellulitis of the right lower extremity.  2. History of paroxysmal atrial fibrillation on chronic Coumadin.  3. History of gastroesophageal reflux disease.  4. History of hypertension.   DISCHARGE MEDICATIONS:  The patient's medication list has been sent over  from Dr. Elvis Coil office, has been reviewed.  He is on the following:  1. Coumadin 5 mg p.o. q.h.s.  He will start back on this starting on      January 13, 2009.  His Coumadin was slightly subtherapeutic and he will      receive prior to his discharge 7.5 mg Coumadin for tonight.  2. He will also be discharged on flecainide 100 mg in the morning, 150      in the evening.  3. K-Dur 20 mEq p.o. b.i.d.  4. HCTZ 25 p.o. daily.  5. Nexium 40 mg p.o. daily.  6. Atenolol 40 p.o. b.i.d.  7. Cartia XT 180 p.o. daily.  8. Aldactone 25 p.o. daily.  9. Furosemide 40 p.o. daily.  10.Lorazepam 1 mg p.o. p.r.n.  11.Advair inhaler one puff b.i.d.  12.ProAir inhaler p.r.n.  13.New medications Avelox 400 mg p.o. daily x5 days.  14.Doxycycline 100 mg p.o. b.i.d. x5 days.   HOSPITAL COURSE:  The patient is a 65 year old white male with past  medical history of hypertension and atrial fibrillation on Coumadin who  presented with right lower extremity redness and swelling.  He has a  history of chronic venous stasis but this looked to be much worse than  that.  He presented to [facility] ED and was found to have a  diagnosis  of cellulitis and then transferred over to the [facility].  There he was started on IV vancomycin for presumed community acquired  MRSA.  He still continued to have some low-grade temps and Avelox was  added.  His white count initially was 12.2.  Over the next 3 days the  patient greatly improved.  His erythema came down.  He was afebrile.  He  remained stable and felt to be medically stable for discharge on January 12, 2009.  We plan to continue him another 5 more days of antibiotics by  mouth for a total of 7 days of therapy.  I am advising him to follow up  with his PCP, Dr. Theresia Lo, in 7-10 days.  In regards to his other  medical issues these are all stable.  His INR was slightly  subtherapeutic requiring an increased dose of Coumadin on the day of  discharge 7.5  and then he will be continued on his regular dose starting  on July 3.   DISPOSITION:  Improved.   ACTIVITY:  As tolerated.  He is clear to return back to work on July 6.   DIET:  Discharge diet is a heart healthy diet.  He is being discharged  home.      Hollice Espy, M.D.  Electronically Signed     SKK/MEDQ  D:  01/12/2009  T:  01/12/2009  Job:  [id number]   cc:   Peter M. [place]il[place], M.D.  Vikki Ports, M.D.

## 2010-11-26 NOTE — Procedures (Signed)
LOWER EXTREMITY VENOUS REFLUX EXAM   INDICATION:  Bilateral legs varicose vein, pain, and discoloration.   REFERRING PHYSICIAN:  Peter M. Swaziland, MD.   Francia Greaves:  Using color-flow imaging and pulse Doppler spectral analysis, the  right common femoral vein, the superficial femoral vein, the popliteal,  posterior tibial, greater and lesser saphenous vein are evaluated.  There is no evidence suggesting deep venous insufficiency in the right  and left lower extremities.   The right and left saphenofemoral junction is not competent.  The right  and left greater saphenous veins are not competent with the caliber as  described below.   The right and left proximal short saphenous veins demonstrate  competency.   GSV Diameter (used if found to be incompetent only)                                            Right    Left  Proximal Greater Saphenous Vein           1.01. cm 1.05. cm  Proximal-to-mid-thigh                     0.93. cm 1.12. cm  Mid thigh                                 0.87. cm 1.12. cm  Mid-distal thigh                          0.87. cm 0.87. cm  Distal thigh                              0.75. cm 0.74. cm  Knee                                      0.75. cm 0.74. cm   IMPRESSION:  1. Right and left greater saphenous vein reflux is identified with the      caliber ranging from on the right 0.75 to 1.01 cm and the left 0.74      to 1.05 cm knee to groin bilaterally.  2. The right and left greater saphenous vein is not aneurysmal.  3. The right and left greater saphenous vein is not tortuous.  4. The deep venous system is competent.  5. The right and left lesser saphenous vein is competent.  6. No evidence of deep venous thrombosis noted in bilateral legs.   ___________________________________________  Quita Skye Hart [name], M.D.   MG/MEDQ  D:  02/05/2007  T:  02/06/2007  Job:  [id number]

## 2010-11-26 NOTE — Assessment & Plan Note (Signed)
OFFICE VISIT   Prokop, Montel E  DOB:  [birth year]                                       10/26/2007  UYQIH#:[id number]   Mr. Dupree returns for further followup regarding his procedures performed  recently for venous insufficiency of both superficial venous systems.  He has had laser ablation performed of both great saphenous veins, the  left side being performed December of 2008 and the right side being  performed in October of 2008.  He had stab phlebectomies performed in  conjunction with both procedures.  He has a long history of  hyperpigmentation and ulceration of his legs with his chronic edema.  Since his procedures, he states that his right leg feels much better  with decreased swelling and decreased discomfort.  He states the left  leg actually feels worse with some numbness along the lateral aspect of  the left leg extending down into toes 3, 4 and 5 and also into the left  foot.  He has had pain in the left foot as well.  He has worn some arch  support orthotics in the past and has had some foot discomfort in the  past but not like this.  He states that the left ankle swelling has  worsened slightly on the left side.  He has had no active ulceration  since his procedures.   EXAMINATION:  He has palpable femoral, popliteal and 2+ dorsalis pedis  pulses with chronic edema as he has had before but appears unchanged.  He has brawny edema in the calves with thickening and peau d'orange in  the calves.  I performed a venous duplex exam in both legs today to rule  out deep venous thrombosis or deep venous problems and both of these  were unremarkable.  He has some incompetence at the right common femoral  vein and also the right small saphenous vein appears to have  incompetence, but this is the side which is actually doing better.  His  left leg has no incompetence in the left small saphenous vein.  Both  great saphenous veins are totally occluded with no  evidence of deep  venous thrombosis.  I reassured him regarding these findings and do not  feel that his active symptoms are related to his vasculature, either  arterial or venous, and he will return to see Dr. Para March for further  evaluation.   Quita Skye Hart [name], M.D.  Electronically Signed   JDL/MEDQ  D:  10/26/2007  T:  10/27/2007  Job:  [id number]   cc:   Dwana Curd. Para March, M.D.

## 2010-11-26 NOTE — Procedures (Signed)
DUPLEX DEEP VENOUS EXAM - LOWER EXTREMITY   INDICATION:  Status post bilateral greater saphenous vein laser  ablation.   HISTORY:  Edema:  Bilateral.  Trauma/Surgery:  Bilateral laser ablation.  Pain:  Bilateral.  PE:  No.  Previous DVT:  No.  Anticoagulants:  No.  Other:   DUPLEX EXAM:                CFV   SFV   PopV  PTV    GSV                R  L  R  L  R  L  R   L  R  L  Thrombosis    o  o  o  o  o  o  o   o  +  +  Spontaneous   +  +  +  +  +  +  +   +  Phasic        +  +  +  +  +  +  +   +  Augmentation  +  +  +  +  +  +  +   +  Compressible  +  +  +  +  +  +  +   +  Competent     0  +  +  +  +  +  +   +   Legend:  + - yes  o - no  p - partial  D - decreased   IMPRESSION:  1. No evidence of bilateral deep venous thrombosis.  2. Right common femoral vein was incompetent.  3. Right short saphenous vein was incompetent.  4. Bilateral greater saphenous veins were thrombosed, status post      laser ablation.   _____________________________  Quita Skye Hart [name], M.D.   DP/MEDQ  D:  10/26/2007  T:  10/26/2007  Job:  [id number]

## 2010-11-26 NOTE — Procedures (Signed)
LOWER EXTREMITY ARTERIAL EVALUATION-SINGLE LEVEL   INDICATION:  Left foot pain, right foot numb.   HISTORY:  Diabetes:  No.  Cardiac:  COPD.  Hypertension:  Yes.  Smoking:  No, quit 12 years ago.  Previous Surgery:  No.   RESTING SYSTOLIC PRESSURES: (ABI)                          RIGHT                LEFT  Brachial:               140                  134  Anterior tibial:        124                  130  Posterior tibial:       142 (>1.0)           140 (1.0)  Peroneal:  DOPPLER WAVEFORM ANALYSIS:  Anterior tibial:        Triphasic            Triphasic  Posterior tibial:       Triphasic            Triphasic  Peroneal:   PREVIOUS ABI'S:  Date:  RIGHT:  LEFT:   IMPRESSION:  No evidence of lower extremity arterial occlusive disease  bilaterally.   ___________________________________________  Quita Skye Hart [name], M.D.   DP/MEDQ  D:  10/26/2007  T:  10/26/2007  Job:  [id number]

## 2010-11-26 NOTE — Assessment & Plan Note (Signed)
OFFICE VISIT   Zell, Amare E  DOB:  1946/06/11                                       05/11/2007  BJYNW#:[id number]   The patient underwent laser ablation of his right greater saphenous vein  with multiple stab phlebectomies on October 20 for severe venous  insufficiency in the right leg with a history of venous stasis ulcers  and severe skin changes in the right leg.  He has had relatively  uneventful course since his procedure with some moderate discomfort  along the course of the greater saphenous vein in his groin and thigh  area, as would be expected.  This is slightly tender to palpation.  He  has had no pain at his stab phlebectomy site in the calf and distal  thigh.  He has had no increasing edema from the knee to the ankle, and  has continued to wear his elastic compression stocking.  He has had some  slight blistering at the medial knee flexion crease due to the  stockings, but this is not inflamed or infected.  I performed a limited  venous duplex exam today and his deep system is widely patent and no  evidence of deep vein thrombosis.  The saphenous vein is totally  occluded from the saphenofemoral junction to the knee with no flow noted  and non-compressible.  He was reassured regarding these findings.  He  will return in early December to have his left leg treated.   Quita Skye Hart [name], M.D.  Electronically Signed   JDL/MEDQ  D:  05/11/2007  T:  05/12/2007  Job:  [id number]

## 2010-11-26 NOTE — Assessment & Plan Note (Signed)
OFFICE VISIT   [name], Ricky E  DOB:  1946/01/23                                       06/28/2007  ZOXWR#:[id number]   The patient is 1 week status post laser ablation to the left greater  saphenous vein with multiple stab phlebectomies in the thigh and calf  region.  He has had a good early result with no increase in the edema  distally.  He does have moderate tenderness in the thigh from the knee  to the groin where the laser ablation procedure was performed.  His stab  phlebectomy wounds are all healing nicely with no tenderness.  He does  have a lot of hyperpigmentation and skin changes as noted prior to his  procedures.  I performed a venous duplex exam in the office today, and  he does have complete occlusion of the saphenous vein from just proximal  to the saphenofemoral junction to just below the knee with a widely  patent deep venous system with normal-appearing flow.  He was reassured  regarding these findings.  He will wear the long-leg stockings for  another 2 weeks and then convert to short-leg stockings.  He will return  in 6 months for a formal followup venous duplex exam of both lower  extremities.   Quita Skye Hart [name], M.D.  Electronically Signed   JDL/MEDQ  D:  06/28/2007  T:  06/29/2007  Job:  [id number]   cc:   Vikki Ports, M.D.

## 2010-11-26 NOTE — Assessment & Plan Note (Signed)
OFFICE VISIT   [name], [name]  DOB:  [birth year]                                       03/22/2007  VWUJW#:[id number]   The patient is a 65 year old patient who has been followed since 2001 by  Dr. Cari Caraway with severe venous insufficiency.  He has a history of  severe varicose veins and darkening skin in both lower extremities over  the past 10 plus years. He recently had venostasis ulcers in the right  leg on at least 3 occasions which had eventually healed.  He has been  wearing elastic compression stockings since initially seen by Dr.  Edilia Bo in 2001 (30-67mm compression) and his condition has continued to  deteriorate.  He describes an aching, throbbing, heavy discomfort in his  legs as the day progresses and now his legs become quite numb.  His work  requires him to stand on his feet all day.  He does have swelling and  edema distally as well as the history of ulceration, but he has had no  history of bleeding, thrombophlebitis or deep venous thrombosis.   He does take Tylenol for pain without relief and elevates his legs  during the day and at night when possible with some improvement in his  symptoms.  He had a venous duplex exam performed in our office on  02/05/2007 which revealed severe reflux in the saphenofemoral junction  bilaterally and complete reflux in both greater saphenous veins  bilaterally with no evidence of deep venous obstruction.   PHYSICAL EXAMINATION:  Vital signs:  Blood pressure 140/70, heart rate  63, respirations 16.  General:  He is an obese male patient.  He is no  apparent distress.  He is alert and oriented x3.  Neck:  Supple, 3 plus  carotid pulses.  No bruits are audible.  Neurology:  Exam is normal with  the exception of decrease sensation in both legs.  Abdomen:  Soft  without any palpable masses.  Extremities:  He has 3 plus popliteal and  dorsalis pedis pulses palpable bilaterally.  He has severe venous  insufficiency with chronic, thick skin changes bilaterally with evidence  of healed ulcerations, particularly medially, near the medial malleolus  and in the midcalf area.  The hyperpigmentation is almost  circumferential, but more severe anteriorly and medially with the right  and left being fairly similar.  He has severe varicose veins in the  distal thigh and the proximal calf over the greater saphenous system.  There are no active ulcers at the present time.   PAST MEDICAL HISTORY:  Is significant in that he does have a history of  atrial fibrillation and has had 2 ablation procedures for his atrial  fibrillation in the past, but continues to be in atrial fibrillation and  is on chronic Coumadin therapy.  He has no history of coronary artery  disease, but does have a family history of coronary artery disease.   IMPRESSION/PLAN:  He certainly has venous insufficiency and would be an  excellent candidate for laser ablation of both greater saphenous veins  with multiple stab phlebectomies, beginning with the right side.  He has  tried all conservative measures and continues to have severe symptoms  and progression of his problem and we will then proceed with  precertification from his insurance company to begin these procedures in  the  near future.   [name] [name], M.D.  Electronically Signed   JDL/MEDQ  D:  03/22/2007  T:  03/23/2007  Job:  [id number]   cc:   Vikki Ports, M.D.

## 2010-11-26 NOTE — Assessment & Plan Note (Signed)
OFFICE VISIT   Rill, [name]  DOB:  1946-03-10                                       02/16/2007  EAVWU#:[id number]   I saw the patient in the office today for continued followup of his  chronic venous insufficiency.  I last saw him in November of 2005.  At  that time I planned on following him up p.r.n.  He returned for a  followup visit today because he has been having some numbness in the  right foot and also in the toes on the left foot, he was concerned about  his vascular status.  Of note, he has a history of atrial fibrillation  and is on Coumadin.  He has undergone 2 previous ablation procedures.   PAST MEDICAL HISTORY:  Significant for hypertension.  He denies any  history of diabetes, history of previous myocardial infarction or  history of congestive heart failure.   REVIEW OF SYSTEMS:  He has had no recent fever or chills.  He has had no  chest pain, chest pressure, palpitations or arrhythmias.  He does admit  to some dyspnea on exertion.   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  He does not smoke cigarettes.   PHYSICAL EXAMINATION:  Blood pressure is 130/73, heart rate is 71.  I  did not detect any carotid bruits.  LUNGS:  Clear bilaterally to auscultation.  CARDIAC:  He has a regular rate and rhythm.  ABDOMEN:  Soft and nontender.  He has palpable femoral, popliteal, dorsalis pedis and posterior tibial  pulses bilaterally.  He has significant hyperpigmentation bilaterally.  He has moderate bilateral lower extremity swelling.  He has no open  ulcers.  Both feet are warm and well perfused.   Doppler study on February 05, 2007 showed no evidence of deep venous  insufficiency in the right and left lower extremities.  It was noted  that the right and left saphenofemoral junction were not competent and  that the right and left saphenous veins were not competent.   Patient continue to  have significant to chronic venous insufficiency.  We have discussed the importance of intermittent leg elevation and I  have discussed this with him in detail in the office today.  I also  think that his compression stockings have stretched out and for this  reason I have written him a prescription for new compression stockings  with a gradient of 30-40 mmHg.  We have also discussed the importance of  continuing to keep the skin well-lubricated.  I did reassure him that he  has excellent arterial flow and is at no risk for a limb loss.   The Doppler study today does suggest that the deep system is competent  with incompetence of the greater saphenous vein.  It could be that he  could benefit from ablation of the greater saphenous.  It could be that  he could benefit from ablation of the greater saphenous vein and  therefore I think it would be reasonable to have him seen [facility], he has requested Dr. Hart [name].  We will make arrangement for this  appointment.  I will see him back on a p.r.n. basis.   Di Kindle. Edilia Bo, M.D.  Electronically Signed   CSD/MEDQ  D:  02/16/2007  T:  02/17/2007  Job:  [id number]

## 2010-11-29 ENCOUNTER — Other Ambulatory Visit: Payer: Self-pay | Admitting: Family Medicine

## 2010-11-29 NOTE — Op Note (Signed)
   [name], [name]                             ACCOUNT NO.:  [id number]   MEDICAL RECORD NO.:  [id number]                   PATIENT TYPE:  INP   LOCATION:  2002                                 FACILITY:  [facility]   PHYSICIAN:  Peter M. Swaziland, M.D.               DATE OF BIRTH:  1945/09/29   DATE OF PROCEDURE:  11/01/2002  DATE OF DISCHARGE:                                 OPERATIVE REPORT   PROCEDURE:  Elective cardioversion.   INDICATION FOR PROCEDURE:  A 65 year old white male with recurrent and  persistent atrial flutter.   DESCRIPTION OF PROCEDURE:  The patient was given anesthesia with 200 mg of  Pentothal.  He was given a single DC synchronized shock at 200 joules with  prompt return to normal sinus rhythm.  There were no complications.   IMPRESSION:  Successful direct current cardioversion.                                               Peter M. Swaziland, M.D.    PMJ/MEDQ  D:  11/01/2002  T:  11/01/2002  Job:  [id number]   cc:   C. Duane Lope, M.D.  [address]  [place]  [place] [address]  Fax: [phone]

## 2010-11-29 NOTE — Op Note (Signed)
NAMEMUAAD, BOEHNING                             ACCOUNT NO.:  [id number]   MEDICAL RECORD NO.:  [id number]                   PATIENT TYPE:  OIB   LOCATION:  2874                                 FACILITY:  [facility]   PHYSICIAN:  Abigail Miyamoto, M.D.              DATE OF BIRTH:  Mar 13, 1946   DATE OF PROCEDURE:  05/12/2003  DATE OF DISCHARGE:                                 OPERATIVE REPORT   PREOPERATIVE DIAGNOSIS:  Anal fistula and hemorrhoids.   POSTOPERATIVE DIAGNOSIS:  Anal fistula and hemorrhoids.   PROCEDURES:  1. Examination under anesthesia.  2. Fistulotomy.  3. Hemorrhoidectomy.   SURGEON:  Abigail Miyamoto, M.D.   ANESTHESIA:  General endotracheal anesthesia.   ESTIMATED BLOOD LOSS:  Minimal.   FINDINGS:  The patient was found to have an anterior located fistula.  He  had multiple large edematous hemorrhoids.  One column was excised.   DESCRIPTION OF PROCEDURE:  The patient was brought to the operating room and  identified as [name].  He was placed supine on the operating table and  anesthesia was induced.  The patient was then placed in the prone position.  His perianal area was then prepped and draped in the usual sterile fashion.  The patient was found to have an anterior fistula and a lot of granulation  tissue around the fistula opening.  A fistula probe was passed through the  opening and passed through the internal opening in the anal canal.  The  bridge of skin between the probe and the surface was then excised with the  electrocautery, performing the fistulotomy.  The excess granulation tissue  was excised and the fistula tract was cauterized.  Further circumferential  examination of the anal canal revealed multiple edematous hemorrhoids.  The  decision was made to proceed with excision of one of the columns.  A 2-0  chromic suture was placed proximal in the anal canal to the hemorrhoid and  the hemorrhoid was then excised with electrocautery.  The mucosa  was then  reapproximated with a running 2-0 chromic suture.  The perianal area was  then anesthetized with 0.5% Marcaine with epinephrine.  Hemostasis appeared  to b achieved with the cautery and the epinephrine.  A piece of Gelfoam was  then inserted into the anal canal.  The patient tolerated the procedure  well.  All counts were correct at the end of the procedure.  The patient was  then placed back in the supine position, extubated in the operating room,  and taken in stable condition to the recovery room.                                               Abigail Miyamoto, M.D.    DB/MEDQ  D:  05/12/2003  T:  05/12/2003  Job:  [id number]

## 2010-11-29 NOTE — Op Note (Signed)
[name], [name]                             ACCOUNT NO.:  [id number]   MEDICAL RECORD NO.:  [id number]                   PATIENT TYPE:  OIB   LOCATION:  3715                                 FACILITY:  [facility]   PHYSICIAN:  Duke Salvia, M.D.               DATE OF BIRTH:  Feb 13, 1946   DATE OF PROCEDURE:  03/03/2003  DATE OF DISCHARGE:                                 OPERATIVE REPORT   PREOPERATIVE DIAGNOSIS:  Atrial fibrillation with flecainide therapy  resulting in atrial flutter.   POSTOPERATIVE DIAGNOSIS:  Atrial fibrillation with flecainide therapy  resulting in atrial flutter.   PROCEDURE:  Invasive electrophysiological study, arrhythmia mapping, with  radiofrequency catheter ablation.   Following the obtaining of informed consent, the patient was brought to the  electrophysiology laboratory and submitted to general anesthesia under the  care of Judie Petit, M.D.  This was because of significant obstructive  sleep apnea.  Cardiac catheterization was performed with adjunctive local  anesthesia.  Following the procedure the catheters were removed, hemostasis  was obtained, and the patient was then transferred to the PACU in stable  condition.   Catheters:  A 5 French quadripolar catheter was inserted via the left  femoral vein to the AV junction.  A 7 French duodecapolar catheter was  inserted via the left femoral vein to the tricuspid annulus.  A 6 French  octapolar catheter was inserted via the right femoral vein to the coronary  sinus.  A 7 French 8 mm deflectable-tip catheter was inserted via an SL2  sheath from the right femoral vein to mapping sites in the posterior septal  space.   Surface leads I, aVF, and V1 were monitored continuously throughout the  procedure.  Following the insertion of the catheters, the stimulation  protocol included incremental atrial pacing, incremental ventricular pacing,  single atrial extrastimuli at a pace cycle length of 900  msec, burst  coronary sinus pacing.   RESULTS:  SURFACE ELECTROCARDIOGRAM AND BASIC INTRACARDIAC INTERVALS:  Rhythm:  Sinus (initial), sinus (final).  Cycle length:  928 msec (initial), 1092 msec (final).  PR interval:  270 msec (initial), 280 msec (final).  QRS duration:  100 msec (initial), 83 msec (final).  QT interval:  413 msec (initial), 401 msec (final).  P-wave duration:  141 msec (initial), 137 msec (final).  Bundle branch block:  Absent (initial), absent (final).  Pre-excitation:  Absent (initial), absent (final).  AH interval:  174 msec (initial), 165 msec (final).  HV interval:  58 msec (initial), 75 msec (final).  His bundle duration:  15 msec (initial), 16 msec (final).   AV NODAL FUNCTION:  The AV Wenckebach cycle length was 700 msec.  VA conduction was dissociated at 600 msec.  The AV nodal effective refractory period at a pace cycle length of 900 msec  was 540 msec and the AV nodal conduction was continuous.  ACCESSORY PATHWAY FUNCTION:  No evidence of an accessory pathway was  identified.   ARRHYTHMIAS INDUCED:  Pacing in the coronary sinus resulted in nonsustained  atrial fibrillation.   Atrial flutter was also induced evolving from atrial fibrillation.  This  appeared to be counterclockwise.  The atrial cycle length, however, was  variable, ranging from 260 to 340 msec.  The entire tricuspid annulus was  mapped in an attempt to try and discern whether this was an isthmus-  dependent flutter.  This indeed appeared to be the case with all but the  septum encompassing 90% of the tachycardia cycle length.  At the region of  the cavotricuspid isthmus, double potentials were seen and significant  conduction slowing was also noted.   Because of variable AA intervals, concealed entrainment mapping could not be  undertaken.   Radiofrequency energy was then applied between the tricuspid annulus and the  IVC.  RF energy terminated the atrial flutter.    RADIOFREQUENCY ENERGY:  A total of 32 minutes 12 seconds of radio wave  energy was applied in multiple applications between the tricuspid annulus  and the IVC.  Initially the RF energy terminated the flutter and then with  continuation of the first line resulted in isthmus block.  However, there  was recurrence of isthmus conduction on multiple occasions.  Repeatedly  energy applied just posterior to the coronary sinus os resulted in  cavotricuspid isthmus block.  There were, however, repeated episodes of  resumption as noted.  Because of this, secondary lines were drawn at 6  o'clock and 7 o'clock with elimination of trans-isthmus conduction.   FLUOROSCOPY TIME:  A total of 15 minutes of fluoroscopy time was utilized at  7-1/2 to 15 frames per second.   IMPRESSION:  1. Sinus bradycardia.  2. Sustained atrial flutter in the context of atrial fibrillation.  Class IC     therapy resulted in transition of the atrial fibrillation to atrial     flutter.  Atrial flutter was successfully ablated with bidirectional     isthmus block.  3.  Prolonged AV nodal conduction and prolonged     Wenckebach cycle length.  3. Prolonged HV intervals.  4. No accessory pathway.  5. Normal ventricular response to programmed stimulation as described above.   SUMMARY AND CONCLUSION:  Results of electrophysiological testing confirmed  that the patient's counterclockwise-appearing flutter was, in fact, isthmus-  dependent.  This was true notwithstanding the variable AA intervals.  Radiofrequency energy eliminated the substrate for the patient's atrial  flutter.  The patient did have inducible atrial fibrillation, and I suspect  that this may be a longer-term problem.   The patient also had significant bradycardia, which has not been  significantly symptomatic to date as best as I know.   RECOMMENDATIONS:  1. Maintain current antiarrhythmic drug therapy. 2. Maintain Coumadin therapy.  3.     Continue beta  blocker therapy, although this dose may be able to be down-     titrated.  4. Observe overnight.  5. Endocarditis prophylaxis for six weeks.                                                   Duke Salvia, M.D.    SCK/MEDQ  D:  03/03/2003  T:  03/04/2003  Job:  [id number]   cc:  Vikki Ports, M.D.  [address]  [place]  [place] [address]  Fax: [phone]   Peter M. Swaziland, M.D.  [address]  [place], [place] [address]  Fax: [phone]   Electrophysiology Laboratory

## 2010-11-29 NOTE — Discharge Summary (Signed)
[name], [name]                             ACCOUNT NO.:  [id number]   MEDICAL RECORD NO.:  [id number]                   PATIENT TYPE:  OIB   LOCATION:  3715                                 FACILITY:  [facility]   PHYSICIAN:  Duke Salvia, M.D.               DATE OF BIRTH:  1946/03/26   DATE OF ADMISSION:  03/03/2003  DATE OF DISCHARGE:  03/04/2003                                 DISCHARGE SUMMARY   DISCHARGE DIAGNOSES:  1. Atrial flutter.     A. Status post radiofrequency catheter ablation this admission, Dr.        Graciela Husbands.  2. History of paroxysmal atrial fibrillation.  3. Obesity.  4. Hypertension.  5. History of bullous emphysema.  6. History of chronic back pain.  7. History of remote pneumothorax.   PROCEDURE:  As noted above, EP study by Dr. Graciela Husbands on March 03, 2003 with  successful radiofrequency catheterization ablation of typical atrial flutter  and significant bradycardia.   HOSPITAL COURSE:  [name] is a 65 year old male with history of atrial  fibrillation and atrial flutter followed by Dr. Peter Swaziland. He was  referred to Dr. Graciela Husbands for radiofrequency catheter ablation. He came into  [facility] on March 03, 2003 for the above procedure. The  procedure was without apparent complications. Dr. Graciela Husbands performed successful  radiofrequency catheter ablation of isthmus dependent flutter. The patient  remained in sinus rhythm on telemetry. On the morning of March 04, 2003, he  was found to be in stable condition. It was felt that he was ready for  discharge to home. He did complain of pain in his bilateral groin. On  examination, these were benign. He did request to be out of work for at  least a week and he was provided with a note for this.   LABORATORY DATA:  White count was 7700. Hemoglobin 13.9, hematocrit 42,  platelet count 182,000. INR 2.1, sodium 140, potassium 4.5, chloride 107,  CO2 27, glucose 102, BUN 12, creatinine 0.8, calcium 9.3.   DISCHARGE MEDICATIONS:  1. Toprol XL 50 mg b.i.d.  2. Flecainide 150 mg b.i.d.  3. Coumadin 5 mg daily except 7.5 mg on Monday, Wednesday, Friday, and     Saturday.  4. Nexium 40 mg daily.   PAIN MANAGEMENT:  Tylenol as needed for pain.   ACTIVITY:  No heavy lifting or strenuous activity for 4 days. No driving for  2 days.   DIET:  Low fat, low cholesterol.   SPECIAL INSTRUCTIONS:  The patient is to call our office with any concerns  of her drainage.    FOLLOW UP:  The patient will see Dr. Graciela Husbands on April 21, 2003 at 9:30 a.m.  He is to followup with Dr. Swaziland as scheduled.      Tereso Newcomer, P.A.  Duke Salvia, M.D.    SW/MEDQ  D:  03/04/2003  T:  03/04/2003  Job:  [id number]   cc:   Peter M. Swaziland, M.D.  [address]  [place], [place] [address]  Fax: [phone]   Duke Salvia, M.D.

## 2010-11-29 NOTE — Assessment & Plan Note (Signed)
[name]                             PULMONARY OFFICE NOTE   [name], [name]                          MRN:          [id number]  DATE:10/21/2006                            DOB:          Feb 17, 1946    SUBJECTIVE:  [name] is a 65 year old man seen in February for dyspnea.  He had pulmonary function testing that was consistent with airflow  limitation, and was empirically started on Advair 250/50 b.i.d.  He also  had signs and symptoms consistent with poor control of his obstructive  sleep apnea in the setting of about 100 pound weight gain over the last  10 years.  We sent him for a CPAP titration study to get him on adequate  pressure to control his apneas and hypopneas.  He returns today to  discuss the results of his polysomnogram.   MEDICATIONS:  1. Nexium 40 mg daily.  2. Atenolol 50 mg b.i.d.  3. Potassium chloride 20 mEq b.i.d.  4. Rythmol 300 mg b.i.d.  5. Lasix 40 mg daily.  6. Cartia XT 180 mg daily.  7. Spironolactone 25 mg daily.  8. Flecainide 50 mg b.i.d.  9. Advair 250/50 one inhalation b.i.d.  10.Ativan p.r.n.  11.Percocet p.r.n.  12.Oxycodone p.r.n.   EXAMINATION:  GENERAL:  This is tall, overweight, but well-appearing  gentleman who is in no distress on room air.  His weight is 342 pounds.  Temperature 98.1.  Blood pressure 112/78.  Heart rate 62.  SPO2 93% on room air.  HEENT EXAM:  The oropharynx is benign.  NECK:  Large without any lymphadenopathy or stridor.  LUNGS:  Somewhat distant, but clear.  I do not hear any wheezing.  HEART:  Has a regular rate and rhythm without murmur.  ABDOMEN:  Obese, soft, non-tender with positive bowel sounds.  EXTREMITIES:  Have no cyanosis, clubbing or edema.  His polysomnogram was performed on September 29, 2006.  It confirmed that he  does indeed have obstructive sleep apnea, and achieves adequate control  of his apneas and hypopneas with a pressure of 14 cm of water.  A chin  strap  was added to his device in order to prevent leakage through his  oropharynx, and he tolerated this well.   IMPRESSION:  1. 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  2. Obstructive sleep apnea.   PLAN:  1. Continue Advair as ordered, and albuterol on an as-needed basis.  2. I will change his CPAP to 14 cm of water, and add a chin strap to      his system.  3. I will follow up with [name] in 2 months to assess his progress      on the CPAP outlined above.  It is quite possible that this will      help not only his daytime sleepiness, but also his dyspnea.  If he      does not derive any benefit from the Advair after his sleep apnea      is well controlled, then we may  decide to discontinue this      medication.     Leslye Peer, MD  Electronically Signed    RSB/MedQ  DD: 10/21/2006  DT: 10/21/2006  Job #: [id number]   cc:   Peter M. Swaziland, M.D.  Duke Salvia, MD, [facility]

## 2010-11-29 NOTE — H&P (Signed)
[name], [name]                             ACCOUNT NO.:  [id number]   MEDICAL RECORD NO.:  [id number]                   PATIENT TYPE:  INP   LOCATION:  2002                                 FACILITY:  [facility]   PHYSICIAN:  Peter M. Swaziland, M.D.               DATE OF BIRTH:  05-Aug-1945   DATE OF ADMISSION:  10/31/2002  DATE OF DISCHARGE:                                HISTORY & PHYSICAL   HISTORY OF PRESENT ILLNESS:  The patient is a 64 year old white male with a  history of atrial fibrillation, initially diagnosed in August 2003.  He  converted at that time on Flecainide.  He has remained in sinus rhythm since  then and has also been on chronic Coumadin.  He was seen in the emergency  room on October 20, 2002, with atypical chest pain, and was in normal sinus  rhythm at that time.  Yesterday afternoon he developed palpitations  associated with discomfort  in his chest and severe sense of uneasiness.  He  presented to the emergency room today and was found to be in atrial flutter  with a ventricular rate in the 70s-90s.  His chest discomfort was unrelieved  with nitroglycerin.  He has no prior history of coronary artery disease.  In  fact, he had a normal cardiac catheterization in 1998, and a normal stress  Cardiolite study in December 2002.  He also had an echocardiogram in August  2003, which was normal.   PAST MEDICAL HISTORY:  1. Significant for atrial fibrillation.  2. History of chest pain.  3. History of obesity.  4. History of hypertension.  5. History of bullous emphysema.  6. History of chronic back pain.  7. Status post removal of a growth from his back.  8. He has a remote history of a pneumothorax.  9. Had a hernia repair as a child.   ALLERGIES:  No known drug allergies.   MEDICATIONS:  1. Coumadin 7.5 mg three days a week, and 5 mg four days a week.  2. Flecainide 150 mg b.i.d.  3. Toprol XL 50 mg b.i.d.  4. Nexium 40 mg daily.   FAMILY HISTORY:  Father died  at age 48 with cerebral thrombosis.  Mother  died at age 84 with cancer.  One brother died at age 34 with congestive  heart failure.  One brother is status post percutaneous coronary  angiography.   SOCIAL HISTORY:  The patient works as a Production designer, theatre/television/film.  He is married  and has three children.  He quit smoking in 1997, but was formerly a three-  pack-a-day smoker.  Denies alcohol use.   REVIEW OF SYSTEMS:  Otherwise unremarkable.   PHYSICAL EXAMINATION:  GENERAL:  The patient is an obese white male, in no  apparent distress.  VITAL SIGNS:  Blood pressure 130/70, pulse 90 and irregular, respirations  20.  HEENT:  Pupils equal, round, reactive.  Conjunctivae clear.  Oropharynx  clear.  NECK:  Without jugular venous distention, adenopathy, thyromegaly, or  bruits.  LUNGS:  Clear to auscultation and percussion.  HEART:  Reveals an irregular rate and rhythm without gallops or murmurs.  ABDOMEN:  Soft, nontender.  There are no masses or bruits.  EXTREMITIES:  Without edema.  Pulses are 2+ and symmetric.  NEUROLOGIC:  Intact.   LABORATORY DATA:  White count 8500, hemoglobin 15.5, hematocrit 41.7,  platelets 202,000.  PT 23.4, INR 2.4, CK 43, with negative MB, troponin less  than 0.01.  Sodium 142, potassium 3.7, chloride 109, BUN 12, creatinine 0.8,  glucose 101.  Electrocardiogram shows atrial flutter with a variable response, a rate of  65.   IMPRESSION:  1. Recurrent atrial fibrillation/flutter, persistent.  2. Atypical chest pain.  3. Obesity.  4. History of emphysema.   PLAN:  Will admit to telemetry.  Rule out a myocardial infarction.  Will  increase his Flecainide to 200 mg b.i.d.  If he does not convert overnight  tonight, will consider an elective cardioversion tomorrow.                                                Peter M. Swaziland, M.D.    PMJ/MEDQ  D:  10/31/2002  T:  10/31/2002  Job:  [id number]   cc:   C. Duane Lope, M.D.  [address]  [place]  [place] [address]  Fax: [phone]

## 2010-11-29 NOTE — H&P (Signed)
[name], [name]                             ACCOUNT NO.:  [id number]   MEDICAL RECORD NO.:  [id number]                   PATIENT TYPE:  INP   LOCATION:  1827                                 FACILITY:  [facility]   PHYSICIAN:  Peter M. Swaziland, M.D.               DATE OF BIRTH:  [birth year]   DATE OF ADMISSION:  02/24/2002  DATE OF DISCHARGE:                                HISTORY & PHYSICAL   HISTORY OF PRESENT ILLNESS:  The patient is a 65 year old white male with a  history of hypertension and obesity who presents with a sudden onset of  chest pain today at 10 a.m. while at work.  The patient complained of  palpitations, shortness of breath and lightheadedness.  He presented to  [facility] where he was found to be in atrial fibrillation with  a ventricular response of 100 beats per minute.  His pain was relieved with  sublingual Nitroglycerin.  The patient has a history of chest pain in the  past.  Cardiac catheterization in 12/1996 was normal.  Stress Cardiolite  study in 06/1999 was normal with an ejection fraction of 66%.  The patient  has had minor palpitations intermittently in the past.  He had a Holter  monitor done in 12/2001 which showed very rare PACs with no significant  arrhythmia.  He has no prior history of atrial fibrillation.   PAST MEDICAL HISTORY:  Significant for morbid obesity, he has a history of  bullous changes on CT of the chest, has chronic back pain and remote  pneumothorax.  He has had a hernia repair as a child.   ALLERGIES:  No known drug allergies.   CURRENT MEDICATIONS:  Toprol XL 50 mg b.i.d., Tylenol p.r.n., Nexium 40 mg  per day.   SOCIAL HISTORY:  The patient works as a Production designer, theatre/television/film, quit smoking in  1997, previously was a 3 pack per day smoker, denies alcohol use, he is  married and has 3 children.   FAMILY HISTORY:  Father died at age 71 with cerebral thrombosis.  Mother  died at age 68 with cancer.  One brother died with  congestive heart failure  at age 29 and one brother is status post percutaneous transluminal coronary  angioplasty at age 80.   REVIEW OF SYSTEMS:  He denies any claudication.  He has had no increased  edema, orthopnea or paroxysmal nocturnal dyspnea, no history of transient  ischemic attack or stroke. The patient's diet is quite poor.  All other  review of systems are negative.   PHYSICAL EXAMINATION:  GENERAL:  The patient is an obese white male in no  apparent distress.  VITAL SIGNS:  Blood pressure 92/60, pulse 9 and irregular.  He is afebrile.  HEENT:  Pupils are equal, round, reactive to light and accommodation.  Extraocular movements are full.  He has no jugular venous distention  or  bruits.  LUNGS:  Clear.  CARDIAC:  Irregular rate and rhythm without murmurs, rubs, gallops or  clicks.  ABDOMEN:  Obese, soft and nontender.  Bowel sounds are positive.  EXTREMITIES:  Without edema.  Pulses are 2+ and symmetric.  NEUROLOGIC:  Alert and oriented times 4.  Cranial nerves II-XII are intact.  Motor and sensory exam are normal.   LABORATORY DATA:  ECG shows atrial fibrillation.  Chest x-ray shows  borderline cardiomegaly with no active disease.    IMPRESSION:  1. New onset atrial fibrillation.  Rate is controlled on Toprol.  The     patient is significantly symptomatic.  2. Hypertension.  3. Obesity.  4. History of bullous emphysema.   PLAN:  The patient will be admitted to telemetry.  We will rule out  myocardial infarction.  He will be treated with IV Heparin and we will  continue Toprol for rate control and we will start Flecainide at this time  to restore normal sinus rhythm.  We will obtain routine laboratory data  including TSH.  We will also obtain an echocardiogram.                                               Peter M. Swaziland, M.D.    PMJ/MEDQ  D:  02/24/2002  T:  02/28/2002  Job:  [id number]   cc:   Miguel Aschoff, M.D.

## 2010-11-29 NOTE — Discharge Summary (Signed)
NAMEREAD, BONELLI                             ACCOUNT NO.:  [id number]   MEDICAL RECORD NO.:  [id number]                   PATIENT TYPE:  INP   LOCATION:  0357                                 [facility]ACILITY[facility]   PHYSICIAN:  Sherin Quarry, MD                   DATE OF BIRTH:  1946/04/19   DATE OF ADMISSION:  07/27/2002  DATE OF DISCHARGE:  07/29/2002                                 DISCHARGE SUMMARY   HISTORY OF PRESENT ILLNESS:  The patient is a pleasant 65 year old man who  on the day prior to admission hit his right leg with a 35-pound vacuum  cleaner while he was repairing it and noted the onset of a bruise on the  lateral aspect of the leg.  He did not think anything more about it until  the morning of admission when he noticed that his leg was warm and tender.  He went to work but became increasingly concerned about the redness and  presented to [facility] where concern was raised about  a possible cellulitis.  He was therefore referred to the hospital for  admission.  He denied any history of fevers, chills, nausea, vomiting,  diarrhea, or abdominal pain.   PHYSICAL EXAMINATION:  HEENT:  Within normal limits.  CHEST:  Clear.  BACK:  Examination of the back revealed no CVA or point tenderness.  CARDIOVASCULAR:  Normal S1 & S2 without murmurs, rubs, or gallops.  ABDOMEN:  Benign, had normal bowel sounds.  Without masses, tenderness, or  organomegaly.  NEUROLOGICAL:  Testing was within normal limits.  EXTREMITIES:  Marked chronic changes of venous stasis.  The right leg was  tender to the midcalf area.  Erythema was noted that seemed to have spread  from the ankle area up the leg particularly in the lateral aspect to the  knee.  There appeared to be a line of demarcation about two-thirds of the  way up the leg where the redness became more pale and appeared to be newly  extended.  Subsequently, the patient underwent a venous Doppler study which  showed no  evidence of DVT.  There was no superficial thrombus or Baker's  cyst.   HOSPITAL COURSE:  On admission, the patient was placed on Unasyn 3 grams IV  every six hours and Tylox p.r.n. for pain.  His usual medications were  continued.  The patient had a very dramatic response to this treatment.  By  January 16th, there was really no visible evidence of cellulitis at all and  he was feeling much better.  Therefore, it was felt reasonable to switch him  to oral antibiotic medicine at that time.  On January 16th he was  discharged.   DISCHARGE MEDICATIONS:  1. Augmentin 875 mg p.o. b.i.d. with food x5 days.  2. The patient will also continue his usual medicines which are:  a. Iron one tablet daily.     b. Coumadin 5 mg six days each week and 7.5 mg the seventh day.     c. Toprol-XL 50 mg b.i.d.     d. Nexium 40 mg daily.     e. Flecainide 150 mg b.i.d.    FOLLOW UP:  He will be encouraged to make a return appointment with Dr.  Theresia Lo in 5-7 days.  Will also make sure that he has a followup visit to  check his prothrombin time.   CONDITION ON DISCHARGE:  Good.                                               Sherin Quarry, MD    SY/MEDQ  D:  07/29/2002  T:  07/29/2002  Job:  [id number]   cc:   Vikki Ports, M.D.  [address]  [place]  [place] [address]  Fax: [phone]   Peter M. Swaziland, M.D.  [address]  [place], [place] [address]  Fax: [phone]

## 2010-11-29 NOTE — Procedures (Signed)
[name], [name]                   ACCOUNT NO.:  [id number]   MEDICAL RECORD NO.:  [id number]          PATIENT TYPE:  OUT   LOCATION:  SLEEP CENTER                 FACILITY:  [facility]   PHYSICIAN:  Barbaraann Share, MD,FCCPDATE OF BIRTH:  10-22[birth year]1945   DATE OF STUDY:  09/29/2006                            NOCTURNAL POLYSOMNOGRAM   REFERRING PHYSICIAN:   LOCATION:  Sleep lab.   REFERRING PHYSICIAN:  Leslye Peer, M.D.   INDICATIONS FOR THE STUDY:  Hypersomnia with sleep apnea.  The patient  returns for formal CPAP titration.   EPWORTH SLEEPINESS SCORE:  16.   SLEEP ARCHITECTURE:  Patient had a total sleep time of 344 minutes with  decreased slow wave sleep as well as REM.  Sleep onset latency was  normal, as was REM onset.  Sleep efficiency was mildly decreased at 91%.   RESPIRATORY DATA:  Patient underwent CPAP titration with a ResMed swift  nasal pillow mask from home.  A chin strap was added, per mouth opening.  CPAP pressure was increased incrementally to relieve both sleep apnea as  well as snoring and also to help with sleep maintenance.  At a final  pressure of 14 cm, the patient had excellent control of events, even  through supine REM.   OXYGEN DATA:  There was O2 desaturation as low as 82% prior to the final  CPAP setting being achieved.   CARDIAC DATA:  Rare PVCs were noted.   MOVEMENT-PARASOMNIA:  The patient had very few leg jerks with no  significant sleep disruption.   IMPRESSIONS-RECOMMENDATIONS:  1. Good control of previously diagnosed obstructive sleep apnea with      14 cm of CPAP delivered by a ResMed      swift nasal pillow device.  A chin strap was added to prevent mouth-      opening.  2. Rare premature ventricular contraction without significant      arrhythmias.      Barbaraann Share, MD,FCCP  Diplomate, American Board of Sleep  Medicine  Electronically Signed     KMC/MEDQ  D:  10/15/2006 15:20:07  T:  10/15/2006 15:35:46  Job:  [id number]

## 2010-11-29 NOTE — Op Note (Signed)
[place]  Patient:    [name], [name] Visit Number: [id number] MRN: [id number]          Service Type: DSU Location: [facility] Attending Physician:  Shelly Rubenstein Dictated by:   Abigail Miyamoto, M.D. Proc. Date: 09/07/01 Admit Date:  09/07/2001                             Operative Report  PREOPERATIVE DIAGNOSIS:  Left chest and right back mass.  POSTOPERATIVE DIAGNOSIS:  Left chest and right back mass.  OPERATION PERFORMED:  Excision of left chest and right back mass.  SURGEON:  Abigail Miyamoto, M.D.  ANESTHESIA:  1% lidocaine and monitored anesthesia care.  ESTIMATED BLOOD LOSS:  Minimal.  INDICATIONS FOR PROCEDURE:  [name] is a 65 year old gentleman who presents with a small mass on his left chest and on his right back.  Both have been slowly increasing in size and are causing pain.  Given this, a decision was made to excise both masses.  DESCRIPTION OF PROCEDURE:  Patient brought to operating room and identified as [name].  He was placed supine on the operating table and anesthesia was induced.  The patient was then turned to the left lateral decubitus position. The small mass on the right back was then identified.  The skin was anesthetized with 1% lidocaine.  A small transverse incision was then made in the skin.  Incision was carried down through the subcutaneous tissue with electrocautery.  The mass which was approximately 4 cm in size was then identified and completely excised circumferentially with cautery.  The mass appeared to be consistent with a lipoma.  The wound was then irrigated with normal saline.  The subcutaneous layer was then closed with interrupted 2-0 Vicryl sutures and the skin was closed with running 4-0 Vicryl suture. Steri-Strips were then applied.  The patient was then placed back in the supine position.  His chest was then prepped and draped in the usual sterile fashion.  The skin overlying the  palpable left chest mass was then anesthetized with 1% lidocaine.  A small transverse incision was made over the top of the mass.  The incision was carried down to the mass which was then again completely excised with electrocautery.  Again this mass was approximately 0.5 cm in size and was consistent with a lipoma.  The wound was then irrigated with saline.  The subcutaneous tissue was then closed with a single interrupted 2-0 Vicryl suture.  Steri-Strips were then applied closing the wound.  The patient tolerated the procedure well.  All sponge, needle and instrument counts were correct at the end of the procedure.  The patient was then taken in stable condition to the recovery room. Dictated by:   Abigail Miyamoto, M.D. Attending Physician:  Shelly Rubenstein DD:  09/07/01 TD:  09/07/01 Job: [id number]

## 2010-11-29 NOTE — Assessment & Plan Note (Signed)
HEALTHCARE                             PULMONARY OFFICE NOTE   [name], [name]                          MRN:          [id number]  DATE:09/09/2006                            DOB:          1945/07/17    [name] is a 65 year old white man with a past medical history  significant for paroxysmal atrial fibrillation, status post ablation and  on Rythmol, hypertension, obesity, OSA on CPAP about 2 years, history of  tobacco use 2 packs per day for 30 years who quit 10 years ago, who  comes in complaining of dyspnea.  He said that he has had dyspnea for  several years, but he has gotten worse for the last year, and  specifically in the 2 months.  Currently, he gets dyspneic with minimal  effort, has had 1 going to his mailbox, and sometimes at rest.  He  sleeps on 2 pillows at night, because otherwise he gets short of breath  as well.  He has frequent wheezes, but he denies chest pain, cough,  chronic bronchorrhea, hemoptysis, or PND.  He is not on oxygen and he  has chronic bilateral leg swelling.  He also says that he has not  checked his CPAP since he got it 2 years ago, and he feels that it is  not helping him a lot because he is tired.  He does not sleep very well,  and also he says that he has gained 100 pounds in the last 10 years.  Finally, he complains of dry eyes and dry mouth, for which he drinks a  lot of water.   PAST MEDICAL HISTORY:  Reviewed.   MEDICATIONS:  Reviewed.   PHYSICAL EXAM:  VITAL SIGNS:  Weight 344, temperature 98.1, blood  pressure 148/77, pulse 60, oxygen saturation 95% on room air.  This is a very pleasant man, obese, in no acute distress.  HEENT:  Pharynx is clear with no exudate.  The oral mucosa is very dry.  NECK:  Supple with no thyromegaly.  No adenopathy.  LUNGS:  He has very good air movement bilaterally with no crackles, no  wheezing, no rhonchi.  HEART:  Regular rate and rhythm with no murmurs.  LOWER  EXTREMITIES:  He is wearing hose, but he has 2+ pitting edema half  way up to the knees.   IMPRESSION:  1. Dyspnea.  2. Chronic obstructive pulmonary disease and reactive airway disease.      Today, we reviewed his PFTs that are compatible with this with good      response to broncho dilators.  PFTs showed an FEV1/FVC ratio of 84,      but FEV1 47, FVC of 55.  At this point, we think this is possibly      what is causing the dyspnea.  However, we think there are also      other explanations we need to rule out.  Problem #2 is the      documented OSA with question adequate CPAP.  3. Question pulmonary hypertension secondary to the above.  He  had a      2D echo done in 2003 that showed normal pulmonary pressure, but he      recently had a TEE, which results we do not have at this time, but      we will get the records.  4. Obesity.  5. Atrial fibrillation status post ablation.  6. Sicca syndrome.   PLAN:  We will start him on Advair 250/50.  We will do walking oxymetry,  chest x-ray.  There is a question of hilar nodes on a chest x-ray of  July 2007.  We will do also a CPAP titration in the lab.  As I said, we  will get records from the TEE.  He will come back to see [place] in 6 more  weeks or earlier if that is needed.      Dennis Bast, MD      Leslye Peer, MD  Electronically Signed   YC/MedQ  DD: 09/09/2006  DT: 09/09/2006  Job #: [id number]

## 2010-11-29 NOTE — Discharge Summary (Signed)
[name], [name]                             ACCOUNT NO.:  [id number]   MEDICAL RECORD NO.:  [id number]                   PATIENT TYPE:  INP   LOCATION:  2002                                 FACILITY:  [facility]   PHYSICIAN:  [name], M.D.               DATE OF BIRTH:  11/13/1945   DATE OF ADMISSION:  02/24/2002  DATE OF DISCHARGE:  02/26/2002                                 DISCHARGE SUMMARY   HISTORY OF PRESENT ILLNESS:  The patient is a 65 year old white male with a  history of hypertension and obesity, who presented with sudden onset of  chest pain.  The patient was noted to be in atrial fibrillation with a  ventricular response of 100 beats per minute.  The patient has had previous  cardiac catheterization in 1998 which was normal and a normal stress  Cardiolite study in December of 2000.  He has had minor palpitations in the  past, but no documented history of atrial fibrillation.  For details of his  past medical history, social history, family history, and physical exam,  please see the admission history and physical.   LABORATORY DATA:  The white count was 8600, hemoglobin 14.7, hematocrit  44.2, and platelets 194,000.  Coagulation times were normal.  Sodium 135,  potassium 4.0, chloride 100, CO2 27, glucose 122, BUN 12, creatinine 0.7.  All other chemistries were normal.  The hemoglobin A1C was 5.9%.  The CK was  74 with negative MB and troponin 0.01.  The cholesterol was 144 with  triglycerides of 103, HDL 35, and LDL 88.  The TSH was 1.54.   The chest x-ray showed borderline cardiomegaly, otherwise no active disease.  The ECG showed atrial fibrillation with a ventricular response of 86 and  otherwise normal.   HOSPITAL COURSE:  The patient was admitted to telemetry.  He ruled out for  myocardial infarction.  He was initially heparinized.  Since his rate was  adequately controlled on Toprol, he was started on flecainide at 150 mg  b.i.d.  He converted to normal  sinus rhythm.  An echocardiogram was obtained  which showed normal left ventricular function with mild mitral  insufficiency, otherwise normal study.  The patient was initiated on  Coumadin.  He remained in stable rhythm.  The following morning, his INR was  1.2, but it was felt that he could be further anticoagulated as an  outpatient.  He was discharge home in stable condition on February 26, 2002.   DISCHARGE DIAGNOSES:  1. Atrial fibrillation, new onset.  2. Chest pain secondary to atrial fibrillation.  3. History of obesity.  4. Hypertension.  5. History of bullous emphysema.   DISCHARGE MEDICATIONS:  1. Coumadin 10 mg per day.  2. Toprol XL 50 mg b.i.d.  3. Nexium 40 mg per day.  4. Flecainide 150 mg twice a day.   DIET:  The  patient is to remain on a low-salt diet.    FOLLOW-UP:  He will follow up for a pro time on Tuesday at Dr. Demetria [name].  [name] office and will follow up with Dr. Swaziland in two weeks.   CONDITION ON DISCHARGE:  Improved.                                               [name], M.D.    PMJ/MEDQ  D:  03/10/2002  T:  03/13/2002  Job:  [id number]   cc:   C. Freda Jackson, M.D.

## 2010-11-29 NOTE — Op Note (Signed)
[place]  Patient:    [name], [name]                          MRN: [id number] Proc. Date: 02/06/00 Adm. Date:  [id number] Disc. Date: [id number] Attending:  Abigail Miyamoto A                           Operative Report  PREOPERATIVE DIAGNOSIS:  Umbilical hernia.  POSTOPERATIVE DIAGNOSIS:  Umbilical hernia.  PROCEDURE:  Umbilical hernia repair.  SURGEON:  Douglas A. Magnus Ivan, M.D.  ANESTHESIA:  General endotracheal anesthesia.  ESTIMATED BLOOD LOSS:  Minimal.  PROCEDURE IN DETAIL:  The patient was brought to the operating room, identified as [name].  He was placed supine on the operating room table and general anesthesia was induced.  His abdomen was then prepped and draped in usual sterile fashion. Using a #15 blade, a small transverse incision was made just below the umbilicus.  The incision was carried down to the hernia sac with the electrocautery.  The hernia sac was then separated from the overlying umbilical skin and then completely excised.  Small interfascial defect was then identified.  The fascial defect was then closed with interrupted #1 Ethibond sutures.  The wound was then anesthetized with 0.25% Marcaine.  The umbilicus was tacked back in place with a 2-0 Vicryl suture.   Subcutaneous layer was then closed with interrupted 3-0 Vicryl sutures and the skin was closed with a running 4-0 Monocryl.  Steri-Strips, gauze and Tegaderm were then applied.  The patient tolerated the procedure well.  All sponge, needle and instrument counts were correct at the end of the procedure.  The patient was then extubated in the operating room and taken in stable condition to the recovery room. DD:  02/06/00 TD:  02/07/00 Job: [id number]

## 2010-11-29 NOTE — H&P (Signed)
[name], [name]                             ACCOUNT NO.:  [id number]   MEDICAL RECORD NO.:  [id number]                   PATIENT TYPE:  INP   LOCATION:  5501                                 FACILITY:  [facility]   PHYSICIAN:  Cassell Clement, M.D.              DATE OF BIRTH:  [birth year]   DATE OF ADMISSION:  06/23/2003  DATE OF DISCHARGE:                                HISTORY & PHYSICAL   CHIEF COMPLAINT:  Irregular heart rate.   HISTORY:  This is a 65 year old Caucasian male who is admitted with  recurrent atrial fibrillation of more than 24 hours duration.  The patient  has a long history of brief episodes of atrial fibrillation dating back 15  years.  At that time he was drinking very large quantities of caffeine each  day.  The patient adjusted his diet, but continued to have episodes of  atrial fibrillation, which were quite bothersome to him and eventually had  an EP evaluation with Dr. Graciela Husbands and underwent radiofrequency ablation of his  atrial flutter/fibrillation in August 2004.  Post ablation, the patient,  however, has continued to have episodes of paroxysmal atrial fibrillation.  This episode began more than 24 hours ago.  The patient was at a [employer], relaxing, when he felt his heart go out of rhythm.  This morning  the patient noted some left chest discomfort shortly after showering.  He  walked from the bathroom into the bedroom and then had an episode of brief  syncope, and he estimates he was out for three or four minutes.  When he  came to the left chest discomfort was gone.  Because of his arrhythmia he  went to see Dr. Graciela Husbands who was not in his office and Dr. Odessa Fleming partner  evaluated him, and noted that he was in atrial fib with a rapid ventricular  response and noted the history of chest pain, and referred him to our  service for evaluation in the emergency room and for admission.   MEDICATIONS:  Present medications:  1. Coumadin 5 mg daily and 7.5  mg on Monday, Wednesday, Friday and Saturday.  2. Atenolol 100 daily.  3. Flecainide 150 mg b.i.d.  4. Nexium 40 mg daily.  5. Stool softener daily.  6. Phillip's milk of magnesia tablet daily.  7. Hydrochlorothiazide 25 mg daily for edema.   SOCIAL HISTORY:  The patient is married.  He has three children from a prior  marriage.  The patient has not smoked cigarettes for more than eight years.  He rarely drinks any alcohol and has had none in more than a year.  He is  avoiding caffeine now.  He works as a Technical brewer for [employer].   PAST MEDICAL AND SURGICAL HISTORY:  The patient has had an inguinal hernia  repair and an umbilical hernia repair.  He also had surgery on a rectal  abscess several months ago by Dr. Magnus Ivan.  He has had a history  hemorrhoids.  Genitourinary history reveals that he has had no recent kidney  symptoms.  He recalls having an IVP 20 years ago.  The patient has a history  of sleep apnea and using a CPAP machine.   ALLERGIES:  No known drug allergies.   PHYSICAL EXAMINATION:  VITAL SIGNS:  On physical examination blood pressure  is 150/70, pulse is 80 and he is now back in sinus rhythm.  Weight is 345  pounds.  GENERAL:  General appearance reveals a large 6 foot 8 inch gentleman in no  acute distress.  SKIN:  The patient's skin is clear.  HEENT:  Pupils are equal and react.  Fundi; not examined.  Mouth and pharynx  normal.  NECK:  Carotid normal.  Jugular venous pressure normal.  Thyroid normal.  CHEST:  Chest is clear to percussion and auscultation.  HEART:  The heart reveals a quiet precordium without murmur, gallop, rub or  click.  ABDOMEN:  The abdomen is soft and nontender.  EXTREMITIES:  The extremities show no phlebitis or edema.  Pedal pulses are  good.  NEUROLOGIC:  Neurologic exam is physiologic.   LABORATORY DATA:  Chest x-ray show cardiomegaly, but no acute change.  EKG  shows atrial fib with an increased ventricular response and  subsequent  conversion to normal sinus rhythm while awaiting for a bed in the emergency  room.  Laboratory studies are unremarkable, except for a potassium of 3.5.  His point of care MBs are normal so far.   DIAGNOSTIC IMPRESSION:  1. Paroxysmal atrial fibrillation.  2. Status post radiofrequency ablation in August 2004.  3. Chest pain, rule out myocardial infarction.  4. Exogenous obesity.  5. Borderline hypokalemia.   DISPOSITION:  The patient is being admitted to telemetry per Dr. Swaziland.  He  will undergo serial EKGs and enzymes.  We will replete his potassium with K-  Dur.  We are also going to try increasing his atenolol to 100 in the morning  and 50 at night.  He will be continued his same dose of Tambocor  (flecainide) 150 mg b.i.d.                                                Cassell Clement, M.D.    TB/MEDQ  D:  06/23/2003  T:  06/23/2003  Job:  [id number]   cc:   Duke Salvia, M.D.   Peter M. Swaziland, M.D.  [address], [address]  [place], [place] [address]  Fax: [phone]   Abigail Miyamoto, M.D.  [address]  [place]  [place] [address]  Fax: [phone]

## 2010-11-29 NOTE — Op Note (Signed)
[name], [name]                   ACCOUNT NO.:  [id number]   MEDICAL RECORD NO.:  [id number]          PATIENT TYPE:  AMB   LOCATION:  DAY                          FACILITY:  [facility]   PHYSICIAN:  Adolph Pollack, M.D.DATE OF BIRTH:  06-Mar-1946   DATE OF PROCEDURE:  DATE OF DISCHARGE:                                 OPERATIVE REPORT   PREOPERATIVE DIAGNOSIS:  Pelvic lymphadenopathy.   POSTOPERATIVE DIAGNOSIS:  Pelvic lymphadenopathy.   PROCEDURE:  Laparoscopic pelvic lymph node biopsy.   SURGEON:  Avel Peace, M.D.   ANESTHESIA:  General.   INDICATIONS:  Mr. Nickson is a 65 year old man who has had some right groin  pain.  CT scan shows some adenopathy.  There is also a scrotal mass that was  removed by Dr. Vernie Ammons in January that is benign.  Mr. Hilligoss is interested in  etiology of pelvic lymphadenopathy and now is asked to see him by Dr.  Darnelle Catalan.  He now presents for laparoscopic pelvic lymph node biopsy.  The  procedures and the risks were discussed with him preoperatively.   TECHNIQUE:  He was seen in the holding area.  He voided in the holding area  and was brought to the operating room, placed supine on the operating room  table and a general anesthetic was administered.  The hair on the lower  abdominal wall was clipped and the area was sterilely prepped and draped.  There was a previous subumbilical scar which I reincised down to the level  of the subcutaneous tissue and using blunt dissection identified the right  anterior rectus sheath.  I made a small incision in the right anterior  rectus sheath and swept the right rectus muscle laterally exposing the  posterior rectus sheath.  A balloon dissection device was then placed into  the peritoneal space.  Under laparoscopic vision, balloon dissection was  performed in the peritoneal space.  The balloon dissection device was  removed and the trocar was placed in the extraperitoneal space.  CO2 gas was  insufflated.   Laparoscope was introduced.  Under direct vision two 5 mm  trocars were placed just to the left of the midline into the extraperitoneal  space.  A big end was identified in the Cooper's ligament and dissecting the  right pelvic space in the inguinal area.  I identified the inferior  epigastric vessels and traced them down to the iliac vessels.  I then  identified athe right spermatic cord and noticed a mass consistent within  the large lymph node next to this.  Using blunt dissection, I isolated this.  I then used Hemoclips to clip some lymphatic blood vessels.  I then  dissected this lymph node free and smaller fragments of it and placed in an  endopouch bag.  The endopouch bag was then removed and lymph node was sent  to pathology.  This was confirmed as being a lymph node and was felt to be  adequate for an evaluation including a lymphoma workup.  Following this, a  small amount of bleeding was controlled with some direct pressure  and  Surgicel.  Once hemostasis was adequate and evacuated a small amount of  blood which was about 50 mL.  I removed the trocars and released the CO2 gas  and extraperitoneal space.  I then reapproximated the anterior  rectus sheath of 0 Vicryl sutures.  The skin incision was then closed with 4-  0 Monocryl subcuticular stitches.  Steri-Strips and sterile dressings were  applied.  He tolerated the procedure without any apparent complications and  was taken to the recovery room in satisfactory condition.       TJR/MEDQ  D:  12/20/2004  T:  12/20/2004  Job:  [id number]   cc:   Valentino Hue. Magrinat, M.D.  [address] [facility]  [place]  [place] [address]  Fax: [phone]   Veverly Fells. Vernie Ammons, M.D.  [address]  [place]  [place] [address]  Fax: [phone]   Vikki Ports, M.D.  [address]  [place]  [place] [address]  Fax: [phone]   Peter M. Swaziland, M.D.  [address]  [place], [place] [address]  Fax: [phone]

## 2010-11-29 NOTE — Op Note (Signed)
NAMESEELEY, HISSONG                   ACCOUNT NO.:  [id number]   MEDICAL RECORD NO.:  [id number]          PATIENT TYPE:  AMB   LOCATION:  [facility]                         FACILITY:  [facility]   PHYSICIAN:  Jullian C. Vernie Ammons, M.D.  DATE OF BIRTH:  1945-10-13   DATE OF PROCEDURE:  08/01/2004  DATE OF DISCHARGE:                                 OPERATIVE REPORT   PREOPERATIVE DIAGNOSIS:  Painful right spermatic cord mass.   POSTOPERATIVE DIAGNOSIS:  Painful right spermatic cord mass.   PROCEDURE:  1.  Scrotal exploration.  2.  Excision of right cord mass.  3.  Right right scrotal orchiectomy.   SURGEON:  Pantelis C. Vernie Ammons, M.D.   ASSISTANT:  Thyra Breed, M.D.   ANESTHESIA:  General.   BLOOD LOSS:  10 cc   SPECIMENS:  1.  Right cord mass.  2.  Right testicle.   DRAINS:  None.   COMPLICATIONS:  None.   INDICATIONS:  The patient is a 65 year old white male who had a vasectomy  approximately 30 years ago. He had complications with infection on the right-  hand side and has had chronic pain ever since in that location. The testicle  and area above the testicle remain tender to touch. He was found on exam to  have a firm mass that was palpable and quite tender just above the right  testicle in the expected location of his vasectomy. It is felt intimately  associated with the cord, and we therefore discussed its fixation with an  attempt to try to maintain blood flow to the testicle; however, I did  discuss the very likely possibility that if the mass involved the blood  vessels that the testicle may very likely would not be spared. He  understands the risks, complications, limitations and has elected to proceed  with surgery. As part of the evaluation, a CT scan was obtained that  revealed adenopathy in his pelvis; however, by examination he has, today, I  noted no inguinal adenopathy and no axillary or cervical adenopathy palpable  either. I discussed that with him preoperatively.   DESCRIPTION OF OPERATION:  After informed consent, the patient was brought  to the major OR and placed on the table, administered general anesthesia,  after which his genitalia and inguinal region was sterilely prepped and  draped. While under anesthesia, I was able to examine the testicle, which  was palpably normal. The mass, which was easily felt above the testicle;  therefore, I made an incision in the upper lateral aspect of the scrotum in  a transverse fashion. I carried this down to the level of the cord, and it  was associated closely to the testicle, so I delivered the testicle as well.  I then palpated the most proximal and distal aspects of the firm, tender  area, which I felt most likely contained a neuroma. I then began dissecting  the mass from the vascular portions of the cord. The mass did contain the  vas by palpation, and I then isolated portions of the cord that were  involved with the mass,  above the mass, ligated these with 4-0 silk suture  and divided this. This was performed both proximal and distal to the mass. I  then dissected next to the mass, trying to maintain as much of the cord  intact as possible. The vas was identified, entering the mass proximally and  distally, and was again ligated with 3-0 silk suture. Once I completely  freed the mass from the cord, I opened the parietal tunica vaginalis to  expose the testicle. Although the testicle did not appear to have  compromised vascular flow, it seemed extremely soft, so I made an incision  in the visceral tunica albuginea and noted no bleeding whatsoever,  indicating the blood supply to the testicle was involved in the mass, and  the testicle would not remain viable.   I therefore felt removal of the testicle was the most appropriate course of  action and used Kelly clamps to isolate the cord further proximally. The  cord was divided distal to the [facility] clamps, and 2-0 Vicryl suture ligatures  followed by free  ties were placed on each of the two cord segments, and the  Kelly clamps removed. I then inspected the cord for any further bleeding and  noted none in the area of the cord or the scrotum. I therefore injected 0.5%  plain Marcaine in the incision and closed the incision with interrupted 3-0  chromic suture. Fluff scrotal support was applied, and the patient was  awakened and taken to recovery in stable satisfactory condition.  He  tolerated the procedure well. No intraoperative complications. Needle,  sponge and instrument counts were reportedly correct at the end of the  operation.   He will be given a prescription for 38 Tylox and will return to my office  for follow-up in two weeks. In the meantime, I will contact him with results  of his pathology report and likely, and we will refer him to an oncologist.     Loraine Leriche   MCO/MEDQ  D:  08/01/2004  T:  08/01/2004  Job:  [id number]

## 2010-12-02 ENCOUNTER — Ambulatory Visit
Admission: RE | Admit: 2010-12-02 | Discharge: 2010-12-02 | Disposition: A | Discharge status: Home / Self Care | Payer: BC Managed Care – PPO | Source: Ambulatory Visit | Attending: Family Medicine | Admitting: Family Medicine

## 2010-12-02 ENCOUNTER — Other Ambulatory Visit: Payer: Self-pay | Admitting: Cardiology

## 2010-12-02 NOTE — Telephone Encounter (Signed)
escribe medication per fax request  

## 2010-12-03 ENCOUNTER — Encounter: Payer: Self-pay | Admitting: Emergency Medicine

## 2010-12-05 ENCOUNTER — Institutional Professional Consult (permissible substitution): Payer: BC Managed Care – PPO | Admitting: Emergency Medicine

## 2010-12-12 ENCOUNTER — Encounter: Payer: Self-pay | Admitting: Emergency Medicine

## 2010-12-12 ENCOUNTER — Telehealth: Payer: Self-pay | Admitting: Cardiology

## 2010-12-12 ENCOUNTER — Ambulatory Visit (INDEPENDENT_AMBULATORY_CARE_PROVIDER_SITE_OTHER): Payer: BC Managed Care – PPO | Admitting: Emergency Medicine

## 2010-12-12 DIAGNOSIS — G4733 Obstructive sleep apnea (adult) (pediatric): Secondary | ICD-10-CM | POA: Insufficient documentation

## 2010-12-12 DIAGNOSIS — G4730 Sleep apnea, unspecified: Secondary | ICD-10-CM

## 2010-12-12 DIAGNOSIS — M069 Rheumatoid arthritis, unspecified: Secondary | ICD-10-CM | POA: Insufficient documentation

## 2010-12-12 DIAGNOSIS — J449 Chronic obstructive pulmonary disease, unspecified: Secondary | ICD-10-CM | POA: Insufficient documentation

## 2010-12-12 NOTE — Telephone Encounter (Signed)
Fax: [phone] Latest of Everything and medication lists

## 2010-12-12 NOTE — Patient Instructions (Signed)
We will start Spiriva 1 inhalation daily We will start ventolin 2 puffs as needed for shortness of breath We will obtain your recent sleep study results We will perform walking oximetry at your next visit Follow up with Dr Delton Coombes in 1 month with a CXR

## 2010-12-12 NOTE — Progress Notes (Signed)
Subjective:    Patient ID: [name], male    DOB: 09-25-1945, 65 y.o.   MRN: [id number]  HPI 64yo man, hx tobacco, Hx RA, followed by Dr Swaziland for HTN, A Fib. OSA on CPAP (most recent PSG 2 mon ago at [place], former testing titrated to 14cmH2O). He is having increased exertional SOB. He has an out of date SABA, no other inhaled medications. He still uses CPAP every night.    Review of Systems  HENT: Positive for congestion and postnasal drip.        Difficulty swallowing  Respiratory: Positive for cough and shortness of breath.   Cardiovascular: Positive for palpitations.  Gastrointestinal:       Heartburn indigestion  Musculoskeletal: Positive for joint swelling.   Past Medical History  Diagnosis Date  . PAF (paroxysmal atrial fibrillation)   . Chronic anticoagulation   . HTN (hypertension)   . Chronic chest pain   . Obesity   . Chronic back pain   . Emphysema   . Chronic venous insufficiency   . Pneumothorax   . Rheumatoid aortitis      Family History  Problem Relation Age of Onset  . Other Father     CEREBRAL THROMBOSIS  . Cancer Mother     bone cancer  . Heart failure Brother 50  . Coronary artery disease Brother 106    PTCA  . Rheum arthritis Mother   . Rheum arthritis Brother   . Rheum arthritis Brother      History   Social History  . Marital Status: Married    Spouse Name: N/A    Number of Children: 3  . Years of Education: N/A   Occupational History  . forklift operator [name]   Social History Main Topics  . Smoking status: Former Smoker -- 2.0 packs/day for 30 years    Types: Cigarettes    Quit date: 03/14/1996  . Smokeless tobacco: Not on file  . Alcohol Use: Yes     one beer per month  . Drug Use: No  . Sexually Active: Not on file   Other Topics Concern  . Not on file   Social History Narrative  . No narrative on file     No Known Allergies   Outpatient Prescriptions Prior to Visit  Medication Sig Dispense Refill  .  Albuterol Sulfate (PROAIR HFA IN) Inhale into the lungs as needed.       Marland Kitchen amiodarone (PACERONE) 400 MG tablet Take 400 mg by mouth daily.        Marland Kitchen atenolol (TENORMIN) 50 MG tablet TAKE 1 TABLET TWICE A DAY  180 tablet  3  . dabigatran (PRADAXA) 150 MG CAPS Take 150 mg by mouth every 12 (twelve) hours.        Marland Kitchen diltiazem (CARDIZEM CD) 180 MG 24 hr capsule TAKE 1 CAPSULE DAILY  90 capsule  3  . Fluticasone-Salmeterol (ADVAIR HFA IN) Inhale into the lungs as needed.        . furosemide (LASIX) 40 MG tablet TAKE 1 TABLET DAILY  90 tablet  3  . hydrochlorothiazide 25 MG tablet TAKE 1 TABLET DAILY  90 tablet  3  . LORazepam (ATIVAN) 1 MG tablet Take 1 mg by mouth as needed.        Marland Kitchen NEXIUM 40 MG capsule TAKE 1 CAPSULE DAILY  90 capsule  3  . potassium chloride SA (K-DUR,KLOR-CON) 20 MEQ tablet TAKE 1 TABLET TWICE A DAY  180 tablet  3  . spironolactone (ALDACTONE) 25 MG tablet TAKE 1 TABLET DAILY  90 tablet  3       Objective:   Physical Exam  Gen: Pleasant, obese, in no distress,  normal affect  ENT: No lesions,  mouth clear,  oropharynx clear, no postnasal drip  Neck: No JVD, no TMG, no carotid bruits  Lungs: No use of accessory muscles, no dullness to percussion, clear without rales or rhonchi  Cardiovascular: RRR, heart sounds normal, no murmur or gallops, no peripheral edema  Musculoskeletal: No deformities, no cyanosis or clubbing  Neuro: alert, non focal  Skin: Warm, no lesions or rashes       Assessment & Plan:  COPD (chronic obstructive pulmonary disease) Trial spiriva + SABA CXR and walking oximetry next time ROV 1 month   Sleep apnea Will get copy of recent PSG to titrate CPAP as needed - my last records indicate that he is on 14cmH2O

## 2010-12-12 NOTE — Assessment & Plan Note (Signed)
Trial spiriva + SABA CXR and walking oximetry next time ROV 1 month

## 2010-12-12 NOTE — Assessment & Plan Note (Signed)
Will get copy of recent PSG to titrate CPAP as needed - my last records indicate that he is on 14cmH2O

## 2010-12-17 ENCOUNTER — Telehealth: Payer: Self-pay | Admitting: *Deleted

## 2010-12-17 NOTE — Telephone Encounter (Signed)
I spoke with Denean @ sleep center and the last sleep study done there was in 2008. I will contact the patient to question where he had this done.

## 2010-12-27 NOTE — Telephone Encounter (Signed)
lmomtcb x1 

## 2011-01-10 NOTE — Telephone Encounter (Signed)
lmomtcb x1 

## 2011-01-13 ENCOUNTER — Telehealth: Payer: Self-pay | Admitting: Emergency Medicine

## 2011-01-13 NOTE — Telephone Encounter (Signed)
Duplicate message. Denea Cheaney, CMA  

## 2011-01-13 NOTE — Telephone Encounter (Signed)
lmomtcb x 1. Need to know where pt had last sleep study and where it was done. Sleep center across the street does not have anything since 2008.

## 2011-01-13 NOTE — Telephone Encounter (Signed)
ERROR

## 2011-01-13 NOTE — Telephone Encounter (Signed)
Pt returned call from [facility]- call him at home within the next hour please. [name]

## 2011-01-13 NOTE — Telephone Encounter (Signed)
[email or web address].Jennifer Castillo, CMA  

## 2011-01-21 ENCOUNTER — Encounter: Payer: Self-pay | Admitting: Emergency Medicine

## 2011-01-21 ENCOUNTER — Ambulatory Visit (INDEPENDENT_AMBULATORY_CARE_PROVIDER_SITE_OTHER): Payer: BC Managed Care – PPO | Admitting: Emergency Medicine

## 2011-01-21 ENCOUNTER — Ambulatory Visit (INDEPENDENT_AMBULATORY_CARE_PROVIDER_SITE_OTHER)
Admission: RE | Admit: 2011-01-21 | Discharge: 2011-01-21 | Disposition: A | Discharge status: Home / Self Care | Payer: BC Managed Care – PPO | Source: Ambulatory Visit | Attending: Emergency Medicine | Admitting: Emergency Medicine

## 2011-01-21 VITALS — HR 90 | Temp 98.6°F | Ht >= 80 in | Wt 353.0 lb

## 2011-01-21 DIAGNOSIS — J449 Chronic obstructive pulmonary disease, unspecified: Secondary | ICD-10-CM

## 2011-01-21 DIAGNOSIS — J4489 Other specified chronic obstructive pulmonary disease: Secondary | ICD-10-CM

## 2011-01-21 DIAGNOSIS — G4730 Sleep apnea, unspecified: Secondary | ICD-10-CM

## 2011-01-21 NOTE — Telephone Encounter (Signed)
Pt in office today ofr OV and says he was mistaken regarding sleep test date.  RB has last sleep study.

## 2011-01-21 NOTE — Progress Notes (Signed)
Addended by: Michel Bickers A on: 01/21/2011 05:13 PM   Modules accepted: Orders

## 2011-01-21 NOTE — Patient Instructions (Signed)
We will perform a CPAP titration study We will check to see how much the cost of Spiriva will be with your insurance, or if you qualify for financial assistance. Your Oxygen level was good with walking today Follow up with Dr Delton Coombes in 6 weeks.

## 2011-01-21 NOTE — Assessment & Plan Note (Signed)
CPAP titration study

## 2011-01-21 NOTE — Progress Notes (Signed)
  Subjective:    Patient ID: [name], male    DOB: 1945-10-27, 65 y.o.   MRN: [id number]  HPI 64yo man, hx tobacco, Hx RA, followed by Dr Swaziland for HTN, A Fib. OSA on CPAP (most recent PSG 2 mon ago at [place], former testing titrated to 14cmH2O). He is having increased exertional SOB. He has an out of date SABA, no other inhaled medications. He still uses CPAP every night.   ROV 01/21/11 -- returns for follow up, last time we started Spiriva as trial = he feels that he probably benefited, felt better. Walking oximetry without desat today. Last PSG was 09/29/06. He is concerned that he won't be able to afford the Spiriva, wants to talk to pharmacy about the cost with his insurance.      Objective:   Physical Exam  Gen: Pleasant, obese, in no distress,  normal affect  ENT: No lesions,  mouth clear,  oropharynx clear, no postnasal drip  Neck: No JVD, no TMG, no carotid bruits  Lungs: No use of accessory muscles, no dullness to percussion, clear without rales or rhonchi  Cardiovascular: RRR, heart sounds normal, no murmur or gallops, no peripheral edema  Musculoskeletal: No deformities, no cyanosis or clubbing  Neuro: alert, non focal  Skin: Warm, no lesions or rashes   Assessment & Plan:   COPD (chronic obstructive pulmonary disease) Seemed to get benefit from Spiriva, not on currently concerned about cost   Sleep apnea CPAP titration study

## 2011-01-21 NOTE — Assessment & Plan Note (Signed)
Seemed to get benefit from Spiriva, not on currently concerned about cost

## 2011-01-30 ENCOUNTER — Ambulatory Visit (HOSPITAL_BASED_OUTPATIENT_CLINIC_OR_DEPARTMENT_OTHER): Payer: BC Managed Care – PPO | Attending: Emergency Medicine

## 2011-01-30 DIAGNOSIS — G4730 Sleep apnea, unspecified: Secondary | ICD-10-CM

## 2011-01-30 DIAGNOSIS — G4733 Obstructive sleep apnea (adult) (pediatric): Secondary | ICD-10-CM | POA: Insufficient documentation

## 2011-02-10 DIAGNOSIS — G4733 Obstructive sleep apnea (adult) (pediatric): Secondary | ICD-10-CM

## 2011-02-10 NOTE — Procedures (Signed)
[name], [name]                   ACCOUNT NO.:  [id number]  MEDICAL RECORD NO.:  [id number]          PATIENT TYPE:  OUT  LOCATION:  SLEEP CENTER                 FACILITY:  [facility]  PHYSICIAN:  Oretha Milch, MD      DATE OF BIRTH:  24-Jan-1946  DATE OF STUDY:  01/30/2011                           NOCTURNAL POLYSOMNOGRAM  REFERRING PHYSICIAN:  Leslye Peer, MD  INDICATIONS FOR THE STUDY:  [name] is a 65 year old gentleman with known obstructive sleep apnea, maintained on CPAP of 14 cm for at least 6 years.  CPAP titration in 2008 had shown CPAP requirement of 14 cm. This study was ordered as a repeat CPAP titration, it is snoring and excessive daytime somnolence.  At the time of this study, he weighed 353 pounds with a height of 80 inches, BMI of 39, neck size of 21 inches, Epworth sleepiness score of 12.  Note that his weight was 340 pounds in 2008.  BEDTIME MEDICATIONS:  None.  This CPAP titration study was performed with a sleep technologist in attendance.  EEG, EOG, EMG, EKG, and respiratory parameters were recorded.  Sleep stages, arousals, limb movements, and respiratory data were scored according to criteria laid out by the American Academy of Sleep Medicine.  SLEEP ARCHITECTURE:  Lights out was at 10:25 p.m.  Lights on was at 4:51 a.m.  Total sleep time was 321 minutes with a sleep period time of 372 minutes.  Sleep efficiency of 83%.  Sleep latency was 12 minutes. Latency to REM sleep was 58 minutes.  Wake after sleep onset was 52 minutes.  Sleep stages of the percentage of total sleep time was N1 14%, N2 62%, N3 0%, REM sleep 24% (78 minutes).  Supine sleep accounted for 100 minutes and supine REM sleep accounted for 78.  Longest period of REM sleep was noted around 4 a.m.  AROUSAL DATA:  There were total of 66 arousals with an arousal index of 12 events per hour, of these 44 were spontaneous, and the rest were associated with respiratory events.  RESPIRATORY  DATA:  CPAP was initiated at 5 cm with a large full-face mask and titrated to a level of 15 cm.  At this level for 48 minutes, 11 hypopneas were noted with an apnea-hypopnea index of 14 events per hour and a low desaturation of 87%.  He was then switched over to a BiPAP at 18/14 cm at this level for 19 minutes including 9 minutes of REM sleep, one obstructive apnea, one hypopnea was noted with an AHI  of six events per hour.  At a final level of BiPAP of 20/16 cm for 57 minutes of sleep including 22 minutes of REM sleep, three hypopneas were noted.  This appears to be the optimal level used during the study.  OXYGEN SATURATION DATA:  The lowest desaturation was 83%.  The desaturation index was 13 events per hour.  He spent 2 minutes with a saturation less than 88%.  LIMB MOVEMENT DATA:  No significant limb movements were noted.  CARDIAC DATA:  The low heart rate was 30 beats per minute, the high heart rate recorded was an  artifact.  DISCUSSION:  He was desensitized with a large ResMed Quattro full-face mask prior to the study.  He developed LEEP at higher pressures due to his facial air and difficulty in obtaining a good seal.  BiPAP was used due to requirement of higher pressure.  IMPRESSION: 1. Severe obstructive sleep apnea with hypopneas causing sleep     fragmentation and oxygen desaturation. 2. This was corrected optimally with a BiPAP of 20/16 cm.  The     increased pressure required may be due to weight gain over the last     4-5 years. 3. No evidence of cardiac arrhythmias, limb movements, or behavioral     disturbance during sleep.  RECOMMENDATIONS: 1. Since he is already on a CPAP, I would initially recommend     increasing the CPAP to 18 cm and obtaining a download at this level     for interval events.  Would also look for symptomatic improvement. 2. If he remains symptomatic or if the download show significant     events or if high CPAP pressures are not tolerated,  we could     consider switching to BiPAP with a pressure of 18/14 cm to 20/16 cm     depending on tolerance.  Remember that PAP compliance is probably     more important than complete control of events. 3. He should be cautioned against driving when sleepy. 4. He should be advised against medications with sedative side     effects.     Oretha Milch, MD Electronically Signed    RVA/MEDQ  D:  02/10/2011 11:53:44  T:  02/10/2011 14:20:37  Job:  [id number]  cc:   Leslye Peer, MD [address] [place], [place] [address]

## 2011-03-04 ENCOUNTER — Ambulatory Visit (INDEPENDENT_AMBULATORY_CARE_PROVIDER_SITE_OTHER): Payer: BC Managed Care – PPO | Admitting: Emergency Medicine

## 2011-03-04 ENCOUNTER — Encounter: Payer: Self-pay | Admitting: Emergency Medicine

## 2011-03-04 DIAGNOSIS — J449 Chronic obstructive pulmonary disease, unspecified: Secondary | ICD-10-CM

## 2011-03-04 DIAGNOSIS — G4730 Sleep apnea, unspecified: Secondary | ICD-10-CM

## 2011-03-04 NOTE — Patient Instructions (Signed)
We will start using albuterol and atrovent nebulized 4 times a day Stop spiriva We will adjust your CPAP to 18 cm H2O Follow up with Dr Delton Coombes in 6 months to evaluate your progress

## 2011-03-04 NOTE — Progress Notes (Signed)
  Subjective:    Patient ID: [name], male    DOB: 01-14-1946, 65 y.o.   MRN: [id number]  HPI 65 yo man, hx tobacco, Hx RA, followed by Dr Sw[place]and for HTN, A Fib. OSA on CPAP (most recent PSG 2 mon ago at [place], former testing titrated to 14cmH2O). He is having increased exertional SOB. He has an out of date SABA, no other inhaled medications. He still uses CPAP every night.   ROV 01/21/11 -- returns for follow up, last time we started Spiriva as trial = he feels that he probably benefited, felt better. Walking oximetry without desat today. Last PSG was 09/29/06. He is concerned that he won't be able to afford the Spiriva, wants to talk to pharmacy about the cost with his insurance.   ROV 03/04/11 -- OSA on CPAP, COPD, Hx RA. He underwent CPAP titration since last time - recommended BiPAP 18/14 vs CPAP 18 (currently on 14). He wants to change from Spiriva to scheduled Nebs.      Objective:   Physical Exam  Gen: Pleasant, obese, in no distress,  normal affect  ENT: No lesions,  mouth clear,  oropharynx clear, no postnasal drip  Neck: No JVD, no TMG, no carotid bruits  Lungs: No use of accessory muscles, no dullness to percussion, clear without rales or rhonchi  Cardiovascular: RRR, heart sounds normal, no murmur or gallops, no peripheral edema  Musculoskeletal: No deformities, no cyanosis or clubbing  Neuro: alert, non focal  Skin: Warm, no lesions or rashes   Assessment & Plan:   COPD (chronic obstructive pulmonary disease) Will change from Spiriva to duonebs at his request  Sleep apnea Work on increase pressure to 18, follow status on higher pressure. Could consider changing to bipap at some point if breathing difficulty continue.

## 2011-03-04 NOTE — Assessment & Plan Note (Signed)
Will change from Spiriva to duonebs at his request

## 2011-03-04 NOTE — Assessment & Plan Note (Signed)
Work on increase pressure to 18, follow status on higher pressure. Could consider changing to bipap at some point if breathing difficulty continue.

## 2011-04-17 ENCOUNTER — Encounter: Payer: Self-pay | Admitting: Emergency Medicine

## 2011-07-25 ENCOUNTER — Telehealth: Payer: Self-pay | Admitting: Emergency Medicine

## 2011-07-25 MED ORDER — ALBUTEROL SULFATE (2.5 MG/3ML) 0.083% IN NEBU: 2.5000 mg | INHALATION_SOLUTION | Freq: Four times a day (QID) | RESPIRATORY_TRACT | Status: DC

## 2011-07-25 MED ORDER — IPRATROPIUM BROMIDE 0.02 % IN SOLN: 500.0000 ug | Freq: Four times a day (QID) | RESPIRATORY_TRACT | Status: DC

## 2011-07-25 NOTE — Telephone Encounter (Signed)
Called and spoke with [facility] and they are aware of ok with refilling the neb meds with 5 additonal refills.  This has been updated on the pts med list.

## 2011-09-12 ENCOUNTER — Telehealth: Payer: Self-pay | Admitting: Emergency Medicine

## 2011-09-12 MED ORDER — TIOTROPIUM BROMIDE MONOHYDRATE 18 MCG IN CAPS: 18.0000 ug | ORAL_CAPSULE | Freq: Every day | RESPIRATORY_TRACT | Status: DC

## 2011-09-12 MED ORDER — ALBUTEROL SULFATE (2.5 MG/3ML) 0.083% IN NEBU: 2.5000 mg | INHALATION_SOLUTION | Freq: Four times a day (QID) | RESPIRATORY_TRACT | Status: DC

## 2011-09-12 MED ORDER — IPRATROPIUM BROMIDE 0.02 % IN SOLN: 500.0000 ug | Freq: Four times a day (QID) | RESPIRATORY_TRACT | Status: DC

## 2011-09-12 NOTE — Telephone Encounter (Signed)
lmomtcb x 1. Medications sent to pharmacy.

## 2011-09-15 NOTE — Telephone Encounter (Signed)
Pt aware. rx sent.  Edrik Rundle, CMA  

## 2011-10-06 ENCOUNTER — Other Ambulatory Visit: Payer: Self-pay | Admitting: Cardiology

## 2011-10-06 MED ORDER — ESOMEPRAZOLE MAGNESIUM 40 MG PO CPDR: 40.0000 mg | DELAYED_RELEASE_CAPSULE | Freq: Every day | ORAL | Status: DC

## 2011-10-06 MED ORDER — FUROSEMIDE 40 MG PO TABS: 40.0000 mg | ORAL_TABLET | Freq: Every day | ORAL | Status: DC

## 2011-10-06 MED ORDER — AMIODARONE HCL 400 MG PO TABS: 400.0000 mg | ORAL_TABLET | Freq: Every day | ORAL | Status: DC

## 2011-10-06 MED ORDER — POTASSIUM CHLORIDE CRYS ER 20 MEQ PO TBCR: 20.0000 meq | EXTENDED_RELEASE_TABLET | Freq: Two times a day (BID) | ORAL | Status: DC

## 2011-10-06 MED ORDER — SPIRONOLACTONE 25 MG PO TABS: 25.0000 mg | ORAL_TABLET | Freq: Every day | ORAL | Status: DC

## 2011-10-06 MED ORDER — DABIGATRAN ETEXILATE MESYLATE 150 MG PO CAPS: 150.0000 mg | ORAL_CAPSULE | Freq: Two times a day (BID) | ORAL | Status: DC

## 2011-10-06 MED ORDER — HYDROCHLOROTHIAZIDE 25 MG PO TABS: 25.0000 mg | ORAL_TABLET | Freq: Every day | ORAL | Status: DC

## 2011-10-06 MED ORDER — DILTIAZEM HCL ER COATED BEADS 180 MG PO CP24: 180.0000 mg | ORAL_CAPSULE | Freq: Every day | ORAL | Status: DC

## 2011-10-06 MED ORDER — ATENOLOL 50 MG PO TABS: 50.0000 mg | ORAL_TABLET | Freq: Two times a day (BID) | ORAL | Status: DC

## 2011-10-06 NOTE — Telephone Encounter (Signed)
Pt needs refill asap bcbs ID is [id number]

## 2011-10-06 NOTE — Telephone Encounter (Signed)
REFILLED MEDICATION UNTIL APPT IN MAY

## 2011-10-21 ENCOUNTER — Encounter (HOSPITAL_COMMUNITY): Payer: Self-pay | Admitting: *Deleted

## 2011-10-21 ENCOUNTER — Emergency Department (HOSPITAL_COMMUNITY)
Admission: EM | Admit: 2011-10-21 | Discharge: 2011-10-21 | Disposition: A | Discharge status: Home / Self Care | Payer: BC Managed Care – PPO | Attending: Emergency Medicine | Admitting: Emergency Medicine

## 2011-10-21 ENCOUNTER — Emergency Department (HOSPITAL_COMMUNITY): Payer: BC Managed Care – PPO

## 2011-10-21 DIAGNOSIS — Z79899 Other long term (current) drug therapy: Secondary | ICD-10-CM | POA: Insufficient documentation

## 2011-10-21 DIAGNOSIS — R069 Unspecified abnormalities of breathing: Secondary | ICD-10-CM | POA: Insufficient documentation

## 2011-10-21 DIAGNOSIS — M069 Rheumatoid arthritis, unspecified: Secondary | ICD-10-CM | POA: Insufficient documentation

## 2011-10-21 DIAGNOSIS — M549 Dorsalgia, unspecified: Secondary | ICD-10-CM | POA: Insufficient documentation

## 2011-10-21 DIAGNOSIS — R1013 Epigastric pain: Secondary | ICD-10-CM | POA: Insufficient documentation

## 2011-10-21 DIAGNOSIS — J438 Other emphysema: Secondary | ICD-10-CM | POA: Insufficient documentation

## 2011-10-21 DIAGNOSIS — I10 Essential (primary) hypertension: Secondary | ICD-10-CM | POA: Insufficient documentation

## 2011-10-21 DIAGNOSIS — G8929 Other chronic pain: Secondary | ICD-10-CM | POA: Insufficient documentation

## 2011-10-21 DIAGNOSIS — K2970 Gastritis, unspecified, without bleeding: Secondary | ICD-10-CM | POA: Insufficient documentation

## 2011-10-21 DIAGNOSIS — K219 Gastro-esophageal reflux disease without esophagitis: Secondary | ICD-10-CM | POA: Insufficient documentation

## 2011-10-21 LAB — HEPATIC FUNCTION PANEL
ALT: 14 U/L (ref 0–53)
AST: 20 U/L (ref 0–37)
Albumin: 3.6 g/dL (ref 3.5–5.2)
Bilirubin, Direct: 0.2 mg/dL (ref 0.0–0.3)

## 2011-10-21 LAB — CBC
Hemoglobin: 13.7 g/dL (ref 13.0–17.0)
MCH: 28.5 pg (ref 26.0–34.0)
MCHC: 33.8 g/dL (ref 30.0–36.0)
Platelets: 199 10*3/uL (ref 150–400)

## 2011-10-21 LAB — BASIC METABOLIC PANEL
Calcium: 9.7 mg/dL (ref 8.4–10.5)
GFR calc non Af Amer: 85 mL/min — ABNORMAL LOW (ref >90)
Glucose, Bld: 116 mg/dL — ABNORMAL HIGH (ref 70–99)
Sodium: 138 mEq/L (ref 135–145)

## 2011-10-21 LAB — TROPONIN I: Troponin I: <0.3 ng/mL (ref <0.30)

## 2011-10-21 LAB — LIPASE, BLOOD: Lipase: 31 U/L (ref 11–59)

## 2011-10-21 MED ORDER — GI COCKTAIL ~~LOC~~
30.0000 mL | Freq: Once | ORAL | Status: AC
  Administered 2011-10-21: 30 mL via ORAL
  Filled 2011-10-21: qty 30

## 2011-10-21 MED ORDER — FAMOTIDINE IN NACL 20-0.9 MG/50ML-% IV SOLN
20.0000 mg | Freq: Once | INTRAVENOUS | Status: AC
  Administered 2011-10-21: 20 mg via INTRAVENOUS
  Filled 2011-10-21: qty 50

## 2011-10-21 MED ORDER — FAMOTIDINE 40 MG PO TABS: 40.0000 mg | ORAL_TABLET | Freq: Two times a day (BID) | ORAL | Status: DC

## 2011-10-21 MED ORDER — OMEPRAZOLE 40 MG PO CPDR: 40.0000 mg | DELAYED_RELEASE_CAPSULE | Freq: Two times a day (BID) | ORAL | Status: DC

## 2011-10-21 MED ORDER — SODIUM CHLORIDE 0.9 % IV SOLN
80.0000 mg | Freq: Once | INTRAVENOUS | Status: AC
  Administered 2011-10-21: 80 mg via INTRAVENOUS
  Filled 2011-10-21: qty 80

## 2011-10-21 NOTE — ED Notes (Signed)
Pt reports having mid abdominal pain, radiating "up to my throat and into my back". Pt denies any new shortness of breath, vomiting or diarrhea but has nausea. Pt reports having s/s x 1 week, pt INAD, resp e/u and skin w/d.

## 2011-10-21 NOTE — ED Notes (Signed)
Could not update time on ekg.  It was done at 1840

## 2011-10-21 NOTE — ED Provider Notes (Signed)
History     CSN: [id number]  Arrival date & time 10/21/11  1635   First MD Initiated Contact with Patient 10/21/11 2031      Chief Complaint  Patient presents with  . Chest Pain  . Abdominal Pain  . Back Pain    (Consider location/radiation/quality/duration/timing/severity/associated sxs/prior treatment) Patient is a 66 y.o. male presenting with abdominal pain. The history is provided by the patient.  Abdominal Pain The primary symptoms of the illness include abdominal pain (burning type of pain) and nausea. The primary symptoms of the illness do not include fever, fatigue, shortness of breath, vomiting, diarrhea or dysuria. Episode onset: 1 week ago. The onset of the illness was sudden. The problem has been gradually worsening.  The abdominal pain is located in the epigastric region. The abdominal pain radiates to the chest. The severity of the abdominal pain is 8/10. The abdominal pain is relieved by certain positions (sitting up). The abdominal pain is exacerbated by fatty foods and certain positions (lying flat).  Symptoms associated with the illness do not include chills or constipation.    Past Medical History  Diagnosis Date  . PAF (paroxysmal atrial fibrillation)   . Chronic anticoagulation   . HTN (hypertension)   . Chronic chest pain   . Obesity   . Chronic back pain   . Emphysema   . Chronic venous insufficiency   . Pneumothorax   . Rheumatoid arthritis     Past Surgical History  Procedure Date  . Cardiac catheterization 1998    NORMAL  . Hernia repair   . Vein ligation and stripping   . Pleural scarification     Family History  Problem Relation Age of Onset  . Other Father     CEREBRAL THROMBOSIS  . Cancer Mother     bone cancer  . Heart failure Brother 50  . Coronary artery disease Brother 68    PTCA  . Rheum arthritis Mother   . Rheum arthritis Brother   . Rheum arthritis Brother     History  Substance Use Topics  . Smoking status: Former  Smoker -- 2.0 packs/day for 30 years    Types: Cigarettes    Quit date: 03/14/1996  . Smokeless tobacco: Not on file  . Alcohol Use: Yes     one beer per month      Review of Systems  Constitutional: Negative for fever, chills and fatigue.  HENT: Negative for congestion and rhinorrhea.   Respiratory: Negative for cough and shortness of breath.   Cardiovascular: Positive for chest pain (when abd pain radiates up). Negative for leg swelling.  Gastrointestinal: Positive for nausea and abdominal pain (burning type of pain). Negative for vomiting, diarrhea, constipation and blood in stool.  Genitourinary: Negative for dysuria and decreased urine volume.  Neurological: Negative for numbness and headaches.  Psychiatric/Behavioral: Negative for confusion.  All other systems reviewed and are negative.    Allergies  Review of patient's allergies indicates no known allergies.  Home Medications   Current Outpatient Rx  Name Route Sig Dispense Refill  . ALBUTEROL SULFATE (2.5 MG/3ML) 0.083% IN NEBU Nebulization Take 2.5 mg by nebulization 3 (three) times daily.    . AMIODARONE HCL 400 MG PO TABS Oral Take 400 mg by mouth daily.    . ATENOLOL 50 MG PO TABS Oral Take 50 mg by mouth 2 (two) times daily.    Marland Kitchen DABIGATRAN ETEXILATE MESYLATE 150 MG PO CAPS Oral Take 150 mg by mouth 2 (two)  times daily.    Marland Kitchen DILTIAZEM HCL ER COATED BEADS 180 MG PO CP24 Oral Take 180 mg by mouth daily.    . FUROSEMIDE 40 MG PO TABS Oral Take 40 mg by mouth daily.    Marland Kitchen HYDROCHLOROTHIAZIDE 25 MG PO TABS Oral Take 25 mg by mouth daily.    Marland Kitchen OMEPRAZOLE PO Oral Take 1 capsule by mouth daily.    Marland Kitchen POTASSIUM CHLORIDE CRYS ER 20 MEQ PO TBCR Oral Take 20 mEq by mouth 2 (two) times daily.    Marland Kitchen SPIRONOLACTONE 25 MG PO TABS Oral Take 25 mg by mouth daily.    . IPRATROPIUM BROMIDE 0.02 % IN SOLN Nebulization Take 500 mcg by nebulization 4 (four) times daily.      BP 115/44  Pulse 56  Temp(Src) 98.3 F (36.8 C) (Oral)   Resp 15  Ht 6\' 8"  (2.032 m)  Wt 362 lb (164.202 kg)  BMI 39.77 kg/m2  SpO2 95%  Physical Exam  Nursing note and vitals reviewed. Constitutional: He is oriented to person, place, and time. He appears well-developed and well-nourished.  HENT:  Head: Normocephalic and atraumatic.  Right Ear: External ear normal.  Left Ear: External ear normal.  Nose: Nose normal.  Neck: Neck supple.  Cardiovascular: Normal rate, regular rhythm, normal heart sounds and intact distal pulses.   Pulmonary/Chest: Effort normal and breath sounds normal. No respiratory distress. He has no wheezes. He has no rales. He exhibits no tenderness.  Abdominal: Soft. He exhibits no distension and no mass. There is tenderness in the epigastric area. There is no rebound and no guarding.  Musculoskeletal: He exhibits no edema.  Lymphadenopathy:    He has no cervical adenopathy.  Neurological: He is alert and oriented to person, place, and time.  Skin: Skin is warm and dry.    ED Course  Procedures (including critical care time)  Labs Reviewed  BASIC METABOLIC PANEL - Abnormal; Notable for the following:    Glucose, Bld 116 (*)    GFR calc non Af Amer 85 (*)    All other components within normal limits  CBC  PRO B NATRIURETIC PEPTIDE  POCT I-STAT TROPONIN I  TROPONIN I  HEPATIC FUNCTION PANEL  LIPASE, BLOOD   Dg Chest 2 View  10/21/2011  *RADIOLOGY REPORT*  Clinical Data: Epigastric pain  CHEST - 2 VIEW  Comparison: Chest radiograph 07/10 1012  Findings: Stable enlarged heart silhouette.  Chronic bronchitic markings similar to prior.  No focal consolidation.  No pneumothorax. Degenerative osteophytosis of the thoracic spine.  IMPRESSION:  1.  No clear acute cardiopulmonary process.  2.  Cardiomegaly and chronic bronchitic markings.  Original Report Authenticated By: Genevive Bi, M.D.     Date: 10/21/2011  Rate: 60  Rhythm: normal sinus rhythm  QRS Axis: normal  Intervals: normal  ST/T Wave abnormalities:  normal  Conduction Disutrbances:first-degree A-V block   Narrative Interpretation:   Old EKG Reviewed: unchanged   1. Gastritis   2. GERD (gastroesophageal reflux disease)       MDM  66 yo male with 1 week of worsening epigastric pain that radiates up his chest to his neck. Worse with greasy/fatty foods, also worse at night when he lays flat. Not worse with exertion. Pain is similar to prior episodes of GERD, though he has been off his Nexium due to insurance reasons. Abd mildly tender on exam, pain resolved with GI cocktail, pepcid and protonix. No evidence of GI bleed by history, and patient's VSS. Do  not feel this is ACS, and 2 troponins negative. Doubt GB as patient's pain is more c/w gastritis, and LFTs and lipase normal. Patient appears well as has benign labs. Will d/c with omeprazole and pepcid, and discussed strict return precautions with patient and wife.         Pricilla Loveless, MD 10/21/11 2259

## 2011-10-21 NOTE — ED Notes (Signed)
Pt denies any questions upon discharge. 

## 2011-10-21 NOTE — ED Notes (Signed)
Patient complains of pain in his stomach, in his back and in his throat x 1 week.  The pain is progressing.  Patient describes the pain as burning.  Tender to touch

## 2011-10-22 NOTE — ED Provider Notes (Signed)
  I performed a history and physical examination of [name] and discussed his management with Dr. Criss Alvine.  I agree with the history, physical, assessment, and plan of care, with the following exceptions: None  Epigastric abdominal burning. He has a history of GERD. The pain is worse with lying down. He has been off Nexium for several weeks. During this time he said his symptoms worsening. He denies shortness of breath, vomiting, diarrhea. Mild epigastric abdominal tenderness on palpation. Received Pepcid, proton axis, GI cocktail numerous department with improvement of his symptoms. We'll be discharged home on Pepcid, omeprazole.  Dayton Bailiff    Dayton Bailiff, MD 10/22/11 [phone]

## 2011-11-10 ENCOUNTER — Encounter: Payer: Self-pay | Admitting: Emergency Medicine

## 2011-11-10 ENCOUNTER — Ambulatory Visit (INDEPENDENT_AMBULATORY_CARE_PROVIDER_SITE_OTHER): Payer: Medicare Other | Admitting: Emergency Medicine

## 2011-11-10 VITALS — BP 112/56 | HR 63 | Temp 98.3°F | Ht >= 80 in | Wt 378.6 lb

## 2011-11-10 DIAGNOSIS — J4489 Other specified chronic obstructive pulmonary disease: Secondary | ICD-10-CM

## 2011-11-10 DIAGNOSIS — J449 Chronic obstructive pulmonary disease, unspecified: Secondary | ICD-10-CM

## 2011-11-10 DIAGNOSIS — G4730 Sleep apnea, unspecified: Secondary | ICD-10-CM

## 2011-11-10 MED ORDER — ESOMEPRAZOLE MAGNESIUM 40 MG PO CPDR: 40.0000 mg | DELAYED_RELEASE_CAPSULE | Freq: Every day | ORAL | Status: DC

## 2011-11-10 NOTE — Assessment & Plan Note (Signed)
Continue the CPAP 18

## 2011-11-10 NOTE — Assessment & Plan Note (Addendum)
Continue duonebs Need to fill out paperwork to get him back on nexium from omeprazole - his cough and copd are worse on the omeprazole.

## 2011-11-10 NOTE — Progress Notes (Signed)
  Subjective:    Patient ID: [name], male    DOB: 1945-12-25, 66 y.o.   MRN: [id number]  HPI 66 yo man, hx tobacco, Hx RA, followed by Dr [place] for HTN, A Fib. OSA on CPAP (most recent PSG 2 mon ago at [facility], former testing titrated to 14cmH2O). He is having increased exertional SOB. He has an out of date SABA, no other inhaled medications. He still uses CPAP every night.   ROV 01/21/11 -- returns for follow up, last time we started Spiriva as trial = he feels that he probably benefited, felt better. Walking oximetry without desat today. Last PSG was 09/29/06. He is concerned that he won't be able to afford the Spiriva, wants to talk to pharmacy about the cost with his insurance.   ROV 03/04/11 -- OSA on CPAP, COPD, Hx RA. He underwent CPAP titration since last time - recommended BiPAP 18/14 vs CPAP 18 (currently on 14). He wants to change from Spiriva to scheduled Nebs.   ROV 11/10/11 -- OSA on CPAP, COPD, Hx RA. Last time changed spiriva to duonebs tid. He returns c/o exertional sob and cough that started around a month ago. Last 2 -3 days worse, nasal gtt and congestion. He has used SABA more frequently. Has gained 10 lbs since last visit. He has gotten use to the CPAP at higher pressure (18). He stopped nexium a month ago! Replaced with prilosec + pepcid.     Objective:   Physical Exam  Gen: Pleasant, obese, in no distress,  normal affect  ENT: No lesions,  mouth clear,  oropharynx clear, no postnasal drip  Neck: No JVD, no TMG, no carotid bruits  Lungs: No use of accessory muscles, no dullness to percussion, clear without rales or rhonchi  Cardiovascular: RRR, heart sounds normal, no murmur or gallops, no peripheral edema  Musculoskeletal: No deformities, no cyanosis or clubbing  Neuro: alert, non focal  Skin: Warm, no lesions or rashes   Assessment & Plan:   Sleep apnea Continue the CPAP 18  COPD (chronic obstructive pulmonary disease) Continue duonebs Need to fill out  paperwork to get him back on nexium from omeprazole - his cough and copd are worse on the omeprazole.

## 2011-11-10 NOTE — Patient Instructions (Signed)
Please continue your Duonebs We will try to change you back to Nexium from omeprazole.  Continue your CPAP every night.  Follow with Dr Delton Coombes in 3 months or sooner if you have any problems.

## 2011-11-12 ENCOUNTER — Telehealth: Payer: Self-pay | Admitting: Emergency Medicine

## 2011-11-12 ENCOUNTER — Ambulatory Visit: Payer: BC Managed Care – PPO | Admitting: Cardiology

## 2011-11-12 NOTE — Telephone Encounter (Signed)
Member ID # [id number]. PA initiated for Nexium 40mg , #30. Form must be filled out for this medication. Awaiting faxed form.

## 2011-11-13 NOTE — Telephone Encounter (Signed)
Form received, completed and faxed back to [facility] at [phone].  Will await response.

## 2011-11-14 NOTE — Telephone Encounter (Signed)
Nexium has been APPROVED from 11/10/11 through 11/09/2012. Pt and pharmacy have been notified. Approval will be scanned into the pt's chart.

## 2011-12-11 ENCOUNTER — Encounter: Payer: Self-pay | Admitting: Cardiology

## 2011-12-11 ENCOUNTER — Ambulatory Visit (INDEPENDENT_AMBULATORY_CARE_PROVIDER_SITE_OTHER): Payer: BC Managed Care – PPO | Admitting: Cardiology

## 2011-12-11 VITALS — BP 140/70 | HR 62 | Ht >= 80 in | Wt 362.6 lb

## 2011-12-11 DIAGNOSIS — Z9229 Personal history of other drug therapy: Secondary | ICD-10-CM

## 2011-12-11 DIAGNOSIS — I4891 Unspecified atrial fibrillation: Secondary | ICD-10-CM

## 2011-12-11 DIAGNOSIS — Z09 Encounter for follow-up examination after completed treatment for conditions other than malignant neoplasm: Secondary | ICD-10-CM

## 2011-12-11 DIAGNOSIS — R079 Chest pain, unspecified: Secondary | ICD-10-CM

## 2011-12-11 DIAGNOSIS — Z7901 Long term (current) use of anticoagulants: Secondary | ICD-10-CM

## 2011-12-11 DIAGNOSIS — I480 Paroxysmal atrial fibrillation: Secondary | ICD-10-CM

## 2011-12-11 NOTE — Assessment & Plan Note (Signed)
This is very well controlled on amiodarone 200 mg daily. We do need to check his thyroid function we'll check a TSH today. He'll continue on his current medication including amiodarone 200 mg daily and diltiazem 180 mg daily. He is also on atenolol 50 mg twice daily. He is on Pradaxa for anticoagulation.
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
## 2011-12-11 NOTE — Patient Instructions (Signed)
Continue your current therapy.  We will check your thyroid today.  I will plan on seeing you back in 6 months.

## 2011-12-11 NOTE — Assessment & Plan Note (Signed)
He has had extensive evaluation in the past for atypical chest pain. Most recent evaluation with a stress Myoview study one year ago was normal. He has no history of coronary disease.

## 2011-12-12 LAB — TSH: TSH: 1.85 u[IU]/mL (ref 0.35–5.50)

## 2012-01-11 ENCOUNTER — Other Ambulatory Visit: Payer: Self-pay | Admitting: Cardiology

## 2012-01-12 ENCOUNTER — Other Ambulatory Visit: Payer: Self-pay | Admitting: Cardiology

## 2012-01-12 MED ORDER — DABIGATRAN ETEXILATE MESYLATE 150 MG PO CAPS: 150.0000 mg | ORAL_CAPSULE | Freq: Two times a day (BID) | ORAL | Status: DC

## 2012-02-11 ENCOUNTER — Ambulatory Visit: Payer: BC Managed Care – PPO | Admitting: Emergency Medicine

## 2012-02-25 ENCOUNTER — Other Ambulatory Visit: Payer: Self-pay | Admitting: Cardiology

## 2012-02-25 NOTE — Telephone Encounter (Signed)
Refilled amiodorone 

## 2012-02-26 ENCOUNTER — Telehealth: Payer: Self-pay | Admitting: Emergency Medicine

## 2012-02-26 NOTE — Telephone Encounter (Signed)
Forward  5 pages from [facility] to Dr. Levy Pupa for review on 02-26-12 ym

## 2012-03-03 ENCOUNTER — Telehealth: Payer: Self-pay | Admitting: Emergency Medicine

## 2012-03-03 NOTE — Telephone Encounter (Signed)
LMOM for pt to call back with phone number on back of insurance card to call for prior auth.  Form received from pharmacy only had fax # listed.  Called [facility] as listed in pt's chart but they did not have him listed in their system.  Prior auth sheet will be left in prior auth bin.

## 2012-03-03 NOTE — Telephone Encounter (Signed)
Will forward to Katie per her request since doing PA's thanks Med already approved per last phone note

## 2012-03-03 NOTE — Telephone Encounter (Signed)
Pt returned triage's call.  Pt stated the number for his insurance's customer service is [phone] and the number he has listed for the pharmacy is [phone].  Antionette Fairy

## 2012-03-03 NOTE — Telephone Encounter (Signed)
Called [employer] [place]-looks as though patient changed insurance coverage for Rx's. PA is needed and paperwork placed in RB's look at to complete information I could not obtain. Will forward to Lauren and RB per protocol.

## 2012-03-03 NOTE — Telephone Encounter (Signed)
Spoke with pharmacist at [facility].  Awaiting fax sheet for prior auth info.

## 2012-03-04 NOTE — Telephone Encounter (Addendum)
I have received PA for Nexium.  I have finished filling this form out and have faxed it back to [facility]. Waiting on approval/denial.

## 2012-03-11 ENCOUNTER — Telehealth: Payer: Self-pay | Admitting: Emergency Medicine

## 2012-03-11 ENCOUNTER — Ambulatory Visit (INDEPENDENT_AMBULATORY_CARE_PROVIDER_SITE_OTHER): Payer: Medicare Other | Admitting: Emergency Medicine

## 2012-03-11 ENCOUNTER — Other Ambulatory Visit: Payer: Self-pay | Admitting: Cardiology

## 2012-03-11 ENCOUNTER — Encounter: Payer: Self-pay | Admitting: Emergency Medicine

## 2012-03-11 VITALS — BP 130/70 | HR 59 | Temp 98.4°F | Ht >= 80 in | Wt 374.8 lb

## 2012-03-11 DIAGNOSIS — J449 Chronic obstructive pulmonary disease, unspecified: Secondary | ICD-10-CM

## 2012-03-11 DIAGNOSIS — R079 Chest pain, unspecified: Secondary | ICD-10-CM

## 2012-03-11 DIAGNOSIS — G8929 Other chronic pain: Secondary | ICD-10-CM

## 2012-03-11 DIAGNOSIS — G4730 Sleep apnea, unspecified: Secondary | ICD-10-CM

## 2012-03-11 DIAGNOSIS — J4489 Other specified chronic obstructive pulmonary disease: Secondary | ICD-10-CM

## 2012-03-11 MED ORDER — TIOTROPIUM BROMIDE MONOHYDRATE 18 MCG IN CAPS: 18.0000 ug | ORAL_CAPSULE | Freq: Every day | RESPIRATORY_TRACT | Status: DC

## 2012-03-11 MED ORDER — ALBUTEROL SULFATE HFA 108 (90 BASE) MCG/ACT IN AERS: 2.0000 | INHALATION_SPRAY | RESPIRATORY_TRACT | Status: DC | PRN

## 2012-03-11 NOTE — Telephone Encounter (Signed)
We need to d/c the duonebs and change him to albuterol 2.5mg  nebs q4h prn sob

## 2012-03-11 NOTE — Assessment & Plan Note (Signed)
Flaring now - he wonders if this is due to his lung disease. I am more suspicious that this relates to stopping the nexium. We have completed the paperwork and he should be able to resume.

## 2012-03-11 NOTE — Patient Instructions (Addendum)
Start taking Spiriva one inhalation daily Restart your Nexium 40mg  daily Use albuterol 2 puffs if needed for shortness of breath.  Call our office today to let us know which nebulized medications you are taking. Call [phone] and ask to speak to Lauren.  Follow with Dr Delton Coombes in 4 months or sooner if you have any problems.

## 2012-03-11 NOTE — Progress Notes (Signed)
  Subjective:    Patient ID: [name], male    DOB: [birth year], 66 y.o.   MRN: [id number]  HPI 66 yo man, hx tobacco, Hx RA, followed by Dr [place] for HTN, A Fib. OSA on CPAP.  He underwent CPAP titration since last time - recommended BiPAP 18/14 vs CPAP 18 (currently on 14). He wants to change from Spiriva to scheduled Nebs.   ROV 11/10/11 -- OSA on CPAP, COPD, Hx RA. Last time changed spiriva to duonebs tid. He returns c/o exertional sob and cough that started around a month ago. Last 2 -3 days worse, nasal gtt and congestion. He has used SABA more frequently. Has gained 10 lbs since last visit. He has gotten use to the CPAP at higher pressure (18). He stopped nexium a month ago! Replaced with prilosec + pepcid.   ROV 03/11/12 -- OSA on CPAP, COPD, Hx RA. He is on DuoNebs tid. Unable to get nexium, needed prior-auth which is pending. Has been wearing CPAP. He tells me that he is more dyspneic compared with last visit, significant decrease in his exercise tolerance. He had to stop Nexium again about 10 days ago. We changed to nebs a year ago due to cost, and he has never felt as well since that change. He has put another 10 lbs since April. He hears wheezing w exertion. He coughs frequently, productive but he hasn't seen it. No abx or pred since last time.     Objective:   Physical Exam Filed Vitals:   03/11/12 1357  BP: 130/70  Pulse: 59  Temp: 98.4 F (36.9 C)   Gen: Pleasant, obese, in no distress,  normal affect  ENT: No lesions,  mouth clear,  oropharynx clear, no postnasal drip  Neck: No JVD, no TMG, no carotid bruits  Lungs: No use of accessory muscles, no dullness to percussion, clear without rales or rhonchi  Cardiovascular: RRR, systolic M,  Musculoskeletal: No deformities, no cyanosis or clubbing  Neuro: alert, non focal  Skin: Warm, no lesions or rashes   Assessment & Plan:   Chronic chest pain Flaring now - he wonders if this is due to his lung disease. I am more  suspicious that this relates to stopping the nexium. We have completed the paperwork and he should be able to resume.   COPD (chronic obstructive pulmonary disease) Will change him back to Spiriva daily Continue albuterol nebs tid and prn Give him an albuterol HFA to use prn rov 4 months  Sleep apnea Continue current CPAP

## 2012-03-11 NOTE — Telephone Encounter (Signed)
I spoke with pt and he stated the nebulizer he uses at home is called duoneb 1 po TID. He stated he does not have just plain albuterol. Will forward to RB since he wanted to know this information

## 2012-03-11 NOTE — Assessment & Plan Note (Signed)
Continue current CPAP 

## 2012-03-11 NOTE — Assessment & Plan Note (Signed)
Will change him back to Spiriva daily Continue albuterol nebs tid and prn Give him an albuterol HFA to use prn rov 4 months

## 2012-03-12 MED ORDER — ALBUTEROL SULFATE (2.5 MG/3ML) 0.083% IN NEBU: 2.5000 mg | INHALATION_SOLUTION | RESPIRATORY_TRACT | Status: DC | PRN

## 2012-03-12 NOTE — Telephone Encounter (Signed)
Medication changed to Albuterol 2.5mg  Neb q4hr PRN SOB per Dr. Delton Coombes. Called patient and informed him of this change and that order was being sent to pharmacy. Nothing further needed at this time.

## 2012-03-17 NOTE — Telephone Encounter (Signed)
Lauren, could you f/u on this? Per Triage Protocol:  Nurses are to f/u on prior auth's once form has been sent.  We do not keep the forms in triage once they are faxed by the nurse.  Thanks!

## 2012-03-17 NOTE — Telephone Encounter (Signed)
Lauren have you heard anything regarding this PA. thanks

## 2012-03-17 NOTE — Telephone Encounter (Signed)
I have not heard anything regarding PA

## 2012-03-18 NOTE — Telephone Encounter (Signed)
Called [employer] [place] at [phone], spoke with Star she stated that a fax was sent to triage fax saying the a Dx was missing from PA.  I informed her I have not seen any fax like this and requested for to re-fax paper.  She is faxing the paper again, once received will fill in missing information and resend PA.

## 2012-03-19 ENCOUNTER — Other Ambulatory Visit: Payer: Self-pay | Admitting: Cardiology

## 2012-03-19 NOTE — Telephone Encounter (Signed)
Refilled atenolol

## 2012-03-19 NOTE — Telephone Encounter (Signed)
Called and spoke with [employer] again as I have still not received the letter they said they sent regarding missing information on the PA.  This time I was advised to put the missing information (dx) on the paper and refax the paper.  I did so now awaiting a response.

## 2012-03-22 ENCOUNTER — Encounter: Payer: Self-pay | Admitting: Emergency Medicine

## 2012-03-22 NOTE — Telephone Encounter (Signed)
Called and spoke with Wilmon Pali at [employer].  States patient was approved for Nexium, however I informed him that I have not received any fax stating this.  Wilmon Pali says he will fax over approval letter.  Once approval letter received will call patient and inform him.

## 2012-03-22 NOTE — Telephone Encounter (Signed)
Patient ID# [id number]  Approved Through: 03/19/2012-03/19/2013 -- Nexium 40mg - capsule delayed release up to 2 capsules per day  Case No.: [id number]  Called and spoke with patient informed him of approval, verbalized understanding and nothing further needed at this time.

## 2012-04-13 ENCOUNTER — Other Ambulatory Visit: Payer: Self-pay | Admitting: Cardiology

## 2012-05-04 ENCOUNTER — Telehealth: Payer: Self-pay | Admitting: Emergency Medicine

## 2012-05-04 NOTE — Telephone Encounter (Signed)
Atc [facility] and was on hold x 15 minutes. WCB. I made pt aware of this and will call whent his is resolved

## 2012-05-04 NOTE — Telephone Encounter (Signed)
I spoke with Jonny Ruiz from [employer]--calling to confirm pt is now only on albuterol instead of duoneb now. I looked in pt chart and saw this is correct. John will get the albuterol neb out to pt. I called and made him aware of this. Nothing further was needed

## 2012-05-25 IMAGING — CR DG KNEE 1-2V*L*
2 series · 2 of 2 positions shown · non-contrast
Comparison: 

CLINICAL DATA: Left leg pain, cellulitis

LEFT KNEE - 1-2 VIEW

[t knee ap left]
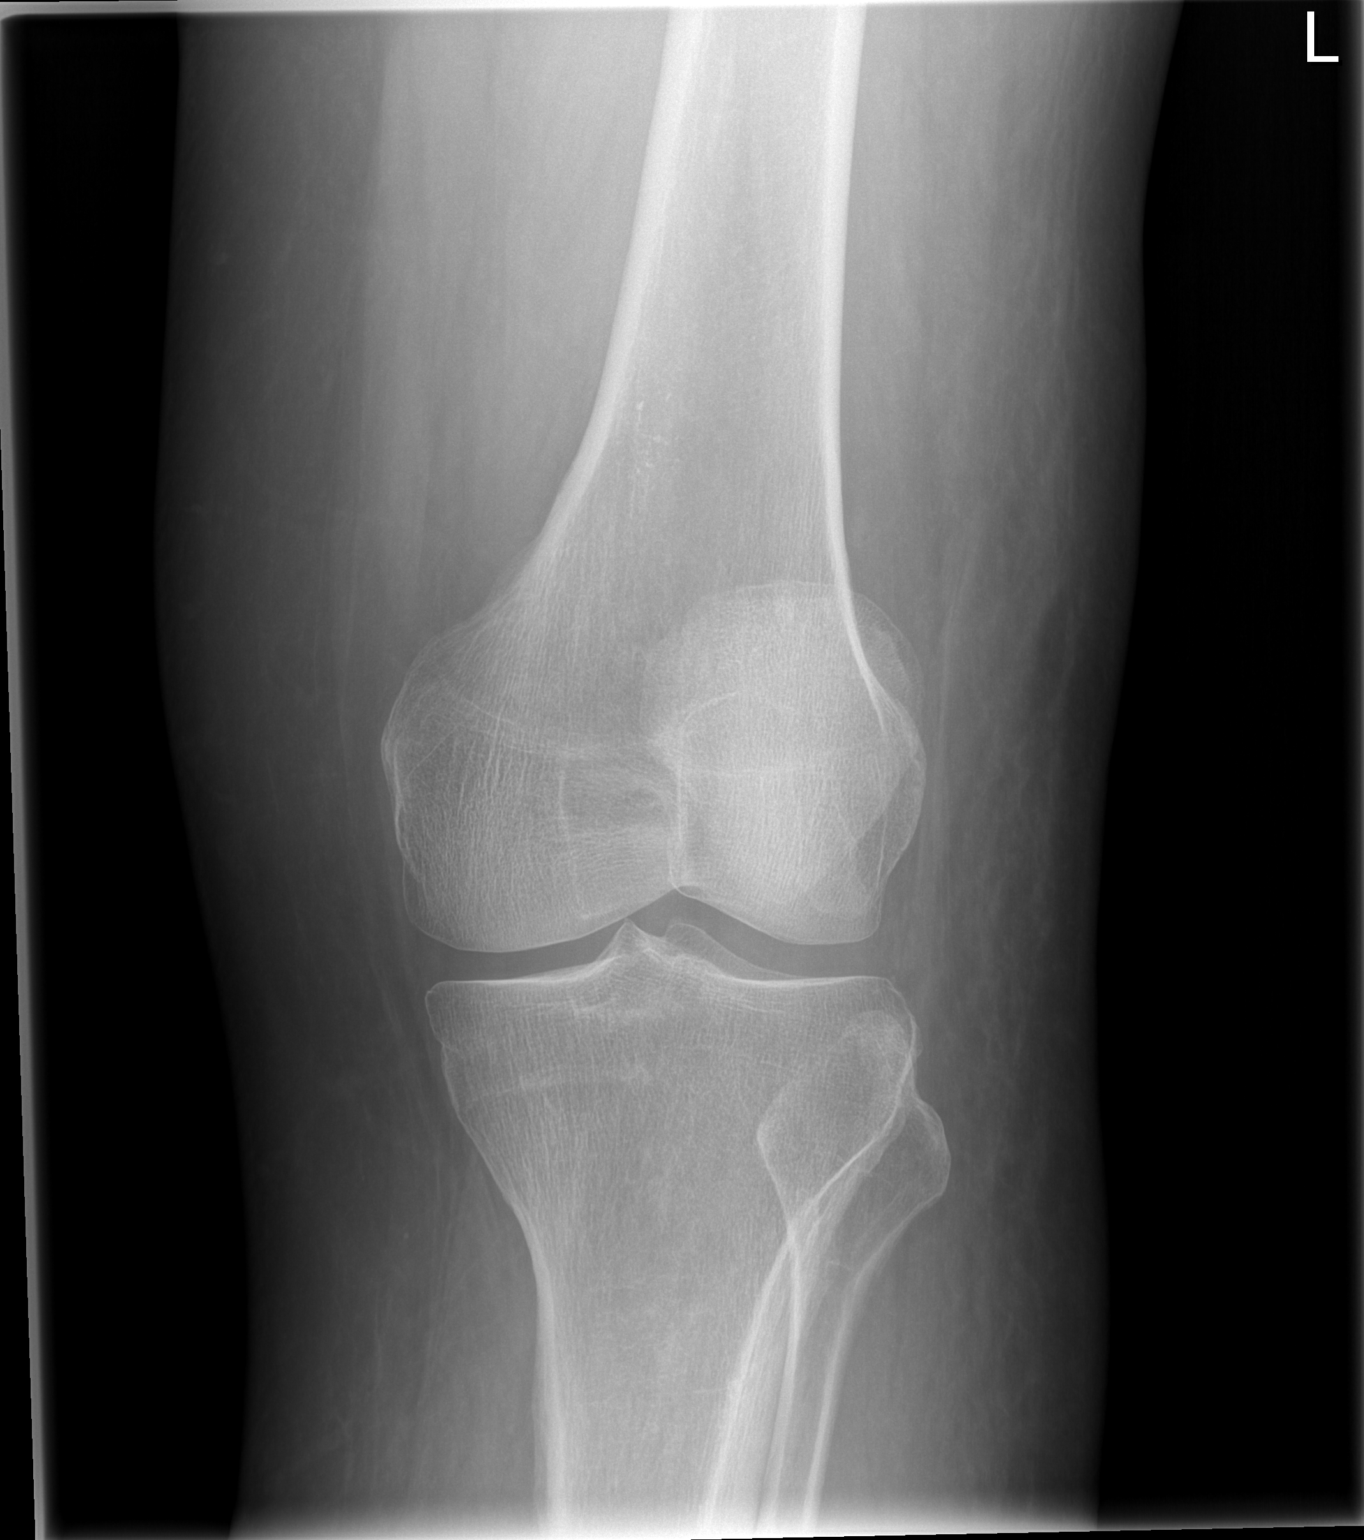

[t knee lat left]
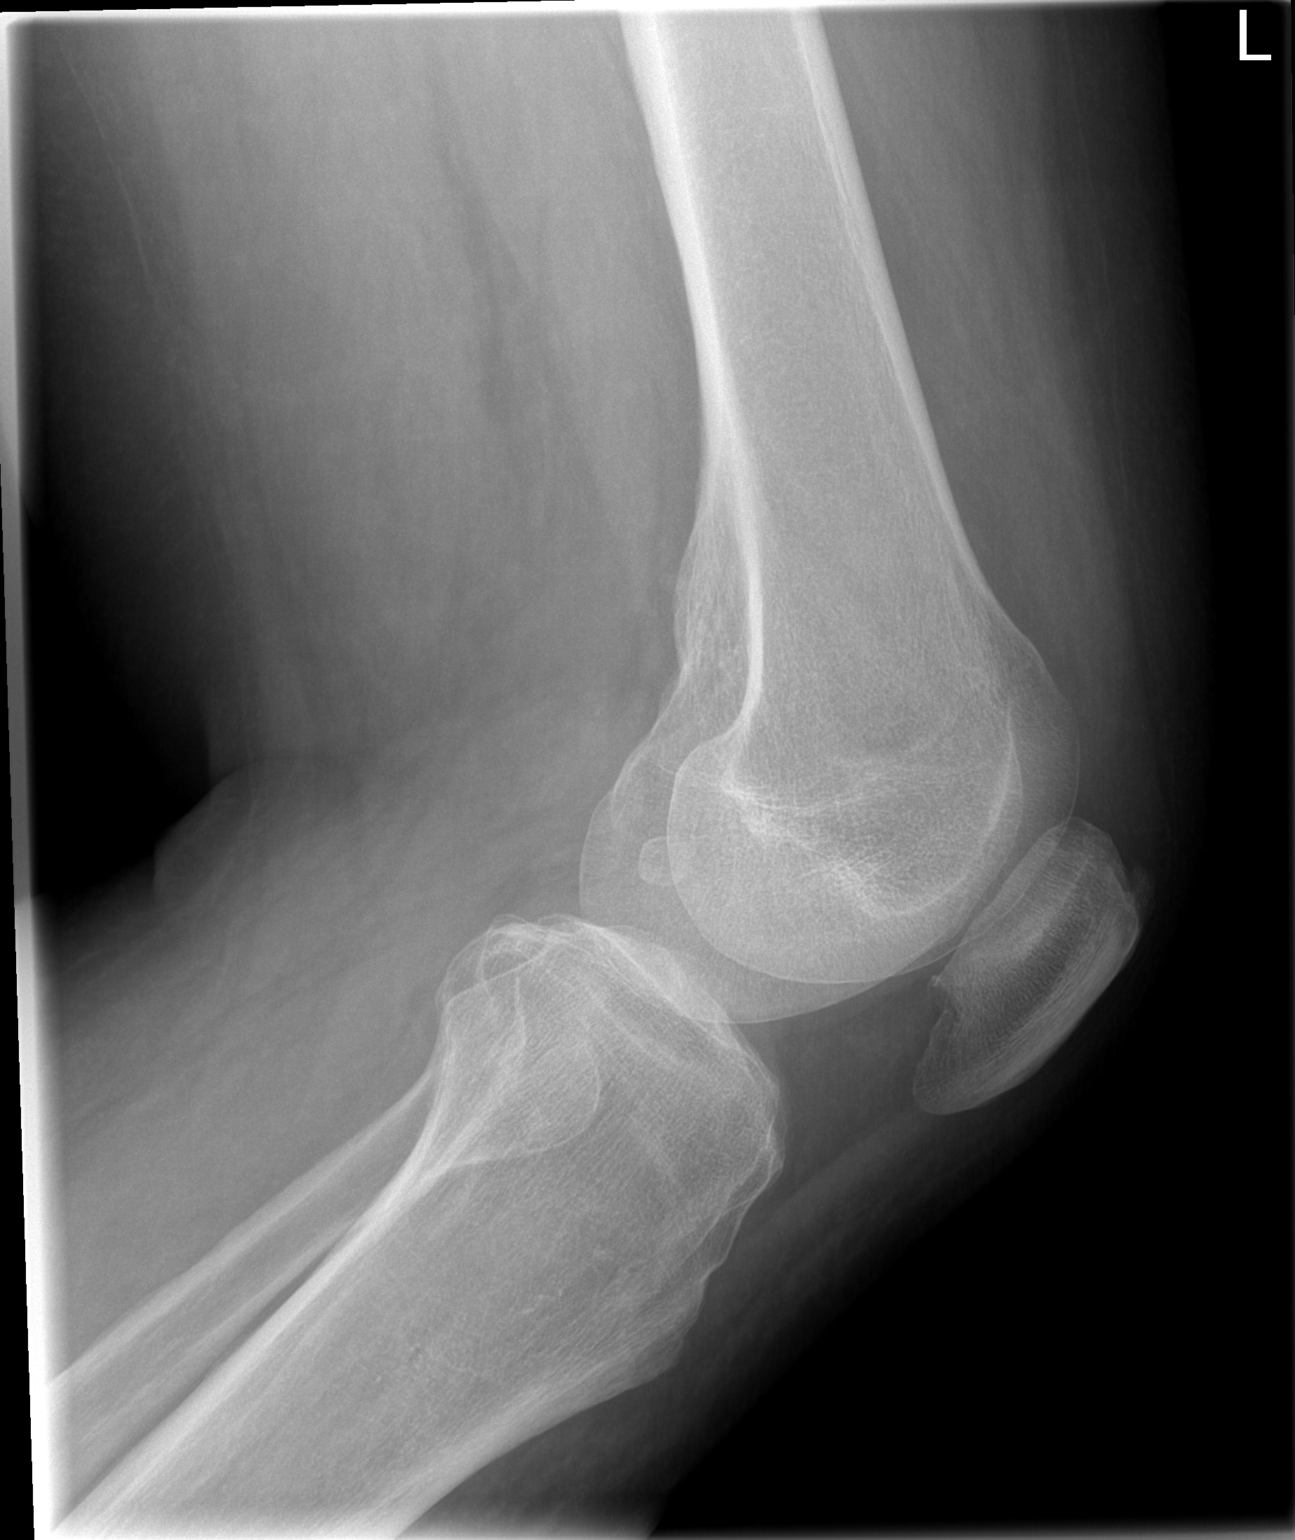

[2 of 2 positions shown; findings below may reference images not displayed]

FINDINGS: There is decreased mineralization of the bones.  No
evidence of fracture dislocation.  Mild spurring of the superior
aspect patella.  Soft tissue edema.
IMPRESSION: 1.  No acute osseous abnormality.

2.  Osteopenia
3.  Edema of the soft tissues.

## 2012-06-15 ENCOUNTER — Telehealth: Payer: Self-pay | Admitting: Emergency Medicine

## 2012-06-15 NOTE — Telephone Encounter (Signed)
Called and spoke with [employer] states she needs a Dx Code on paperwork previously faxed. Gabriel Rung stated she has refaxed form however I informed Monique that I have not seen any paperwork on this patient.  Gabriel Rung is to refax paperwork so Dx Code can be added and I will fax paperwork back.

## 2012-06-16 ENCOUNTER — Other Ambulatory Visit: Payer: Self-pay

## 2012-06-16 MED ORDER — HYDROCHLOROTHIAZIDE 25 MG PO TABS: 25.0000 mg | ORAL_TABLET | Freq: Every day | ORAL | Status: DC

## 2012-06-16 NOTE — Telephone Encounter (Signed)
Forms received, completed with diagnosis code and faxed back to [phone] AND [phone].

## 2012-06-26 ENCOUNTER — Other Ambulatory Visit: Payer: Self-pay | Admitting: Emergency Medicine

## 2012-06-29 ENCOUNTER — Other Ambulatory Visit: Payer: Self-pay | Admitting: Cardiology

## 2012-06-29 MED ORDER — DILTIAZEM HCL ER COATED BEADS 180 MG PO CP24: 180.0000 mg | ORAL_CAPSULE | Freq: Every day | ORAL | Status: DC

## 2012-06-29 MED ORDER — FUROSEMIDE 40 MG PO TABS: 40.0000 mg | ORAL_TABLET | Freq: Every day | ORAL | Status: DC

## 2012-07-09 ENCOUNTER — Encounter: Payer: Self-pay | Admitting: Emergency Medicine

## 2012-07-09 ENCOUNTER — Telehealth: Payer: Self-pay | Admitting: Emergency Medicine

## 2012-07-09 ENCOUNTER — Ambulatory Visit (INDEPENDENT_AMBULATORY_CARE_PROVIDER_SITE_OTHER): Payer: BC Managed Care – HMO | Admitting: Emergency Medicine

## 2012-07-09 VITALS — BP 138/60 | HR 67 | Temp 98.2°F | Ht >= 80 in | Wt 382.2 lb

## 2012-07-09 DIAGNOSIS — Z23 Encounter for immunization: Secondary | ICD-10-CM

## 2012-07-09 DIAGNOSIS — R0600 Dyspnea, unspecified: Secondary | ICD-10-CM

## 2012-07-09 DIAGNOSIS — J449 Chronic obstructive pulmonary disease, unspecified: Secondary | ICD-10-CM

## 2012-07-09 DIAGNOSIS — J4489 Other specified chronic obstructive pulmonary disease: Secondary | ICD-10-CM

## 2012-07-09 DIAGNOSIS — R0989 Other specified symptoms and signs involving the circulatory and respiratory systems: Secondary | ICD-10-CM

## 2012-07-09 NOTE — Progress Notes (Signed)
  Subjective:    Patient ID: [name], male    DOB: 1945-12-17, 66 y.o.   MRN: [id number]  HPI 66 yo man, hx tobacco, Hx RA, followed by Dr Swaziland for HTN, A Fib. OSA on CPAP.  He underwent CPAP titration since last time - recommended BiPAP 18/14 vs CPAP 18 (currently on 14). He wants to change from Spiriva to scheduled Nebs.   ROV 11/10/11 -- OSA on CPAP, COPD, Hx RA. Last time changed spiriva to duonebs tid. He returns c/o exertional sob and cough that started around a month ago. Last 2 -3 days worse, nasal gtt and congestion. He has used SABA more frequently. Has gained 10 lbs since last visit. He has gotten use to the CPAP at higher pressure (18). He stopped nexium a month ago! Replaced with prilosec + pepcid.   ROV 03/11/12 -- OSA on CPAP, COPD, Hx RA. He is on DuoNebs tid. Unable to get nexium, needed prior-auth which is pending. Has been wearing CPAP. He tells me that he is more dyspneic compared with last visit, significant decrease in his exercise tolerance. He had to stop Nexium again about 10 days ago. We changed to nebs a year ago due to cost, and he has never felt as well since that change. He has put another 10 lbs since April. He hears wheezing w exertion. He coughs frequently, productive but he hasn't seen it. No abx or pred since last time.   ROV 07/09/12 -- OSA on CPAP, COPD, Hx RA.  Last time we started Spiriva, changed duonebs to albuterol for prn use. Also restarted nexium. He returns reporting Since last time his dyspnea is worse - he can't recover from a walk as quickly. Started taking Spiriva sporadically about 2 weeks ago. Also having exertional CP, last myoview was April 2012 (no intervention needed).  Having wheezing, cough that is non-prod. Increased B LE edema over lat 3 mon. Has gained 8 lbs since last visit.      Objective:   Physical Exam Filed Vitals:   07/09/12 1527  BP: 138/60  Pulse: 67  Temp: 98.2 F (36.8 C)   Gen: Pleasant, obese, in no distress,  normal  affect  ENT: No lesions,  mouth clear,  oropharynx clear, no postnasal drip  Neck: No JVD, no TMG, no carotid bruits  Lungs: No use of accessory muscles, no dullness to percussion, clear without rales or rhonchi  Cardiovascular: RRR, systolic M,  Musculoskeletal: No deformities, no cyanosis or clubbing  Neuro: alert, non focal  Skin: Warm, no lesions or rashes   Assessment & Plan:   COPD (chronic obstructive pulmonary disease) Cost prohibiting spiriva - change back to duonebs and stop the spiriva  Dyspnea Suspect that a component of this could be cardiac - he has new CP, more edema, some wt gain. - will set him up to see Dr Swaziland to discuss.

## 2012-07-09 NOTE — Assessment & Plan Note (Signed)
Cost prohibiting spiriva - change back to duonebs and stop the spiriva

## 2012-07-09 NOTE — Assessment & Plan Note (Signed)
Suspect that a component of this could be cardiac - he has new CP, more edema, some wt gain. - will set him up to see Dr Swaziland to discuss.

## 2012-07-09 NOTE — Patient Instructions (Addendum)
Please stop Spiriva We will change your nebulized medication from albuterol back to DuoNebs (albuterol + atrovent). Please use this 4 times a day on a schedule You need to see Dr Swaziland to discuss your chest pain, swelling, weight gain. We will help arrange this today.  Follow with Dr Delton Coombes in 2 months or sooner if you have any problems.

## 2012-07-09 NOTE — Telephone Encounter (Signed)
Called and spoke with the scheduler at Dr. Illa Level office([facility]) , earliest available appt is Jan 29 at 4pm. Appt set, ATC patient to make him aware of this, no answer. LMOMTCB

## 2012-07-13 NOTE — Telephone Encounter (Signed)
Spoke with patient, patient will be out of town Jan 29,2014 until Feb 3,2014.  Appt scheduled to see Dr. Swaziland Aug 24, 2012 at 130pm. Patient aware and nothing further needed at this time.

## 2012-07-21 ENCOUNTER — Other Ambulatory Visit: Payer: Self-pay

## 2012-07-21 MED ORDER — SPIRONOLACTONE 25 MG PO TABS: 25.0000 mg | ORAL_TABLET | Freq: Every day | ORAL | Status: DC

## 2012-08-11 ENCOUNTER — Ambulatory Visit: Payer: BC Managed Care – HMO | Admitting: Cardiology

## 2012-08-12 ENCOUNTER — Encounter: Payer: Self-pay | Admitting: Cardiology

## 2012-08-23 ENCOUNTER — Encounter: Payer: Self-pay | Admitting: Cardiology

## 2012-08-23 ENCOUNTER — Ambulatory Visit: Payer: BC Managed Care – HMO | Admitting: Cardiology

## 2012-08-23 ENCOUNTER — Ambulatory Visit (INDEPENDENT_AMBULATORY_CARE_PROVIDER_SITE_OTHER): Payer: BC Managed Care – HMO | Admitting: Cardiology

## 2012-08-23 VITALS — BP 138/64 | HR 60 | Ht >= 80 in | Wt 364.8 lb

## 2012-08-23 DIAGNOSIS — I480 Paroxysmal atrial fibrillation: Secondary | ICD-10-CM

## 2012-08-23 DIAGNOSIS — E669 Obesity, unspecified: Secondary | ICD-10-CM

## 2012-08-23 DIAGNOSIS — Z7901 Long term (current) use of anticoagulants: Secondary | ICD-10-CM

## 2012-08-23 DIAGNOSIS — I4891 Unspecified atrial fibrillation: Secondary | ICD-10-CM

## 2012-08-23 DIAGNOSIS — Z79899 Other long term (current) drug therapy: Secondary | ICD-10-CM

## 2012-08-23 DIAGNOSIS — G4730 Sleep apnea, unspecified: Secondary | ICD-10-CM

## 2012-08-23 LAB — HEPATIC FUNCTION PANEL
AST: 34 U/L (ref 0–37)
Alkaline Phosphatase: 67 U/L (ref 39–117)
Bilirubin, Direct: 0.2 mg/dL (ref 0.0–0.3)
Total Bilirubin: 0.8 mg/dL (ref 0.3–1.2)

## 2012-08-23 LAB — BASIC METABOLIC PANEL
Calcium: 9.5 mg/dL (ref 8.4–10.5)
GFR: 75.83 mL/min (ref >60.00)
Glucose, Bld: 111 mg/dL — ABNORMAL HIGH (ref 70–99)
Potassium: 3.8 mEq/L (ref 3.5–5.1)
Sodium: 136 mEq/L (ref 135–145)

## 2012-08-23 MED ORDER — AMIODARONE HCL 200 MG PO TABS: 200.0000 mg | ORAL_TABLET | Freq: Every day | ORAL | Status: DC

## 2012-08-23 NOTE — Patient Instructions (Addendum)
Will obtain labs today and call you with the results (hfp/bmet/tsh)  Stop diltiazem.  Reduce amiodarone to 100 mg daily.  We will transition you to coumadin from Pradaxa. We will have our coumadin clinic help you.  I will see you in 6 months.

## 2012-08-23 NOTE — Progress Notes (Signed)
History of Present Illness: [name] is seen today for follow up. He has a history of recurrent atrial fibrillation. He has been on chronic amiodarone therapy since September of 2011. He states this has worked very well for him and he has had very infrequent episodes of arrhythmia. He states he has episodes of arrhythmia about 2 times a month that last only 20-30 seconds. He has lost weight down from 387 pounds and is feeling better. His breathing is improved. His biggest problem today is the cost of his medications. He has a $4000 deductible. He was to do all he can to minimize his medication costs. He does complain of some intermittent pain in his left upper abdominal quadrant. It is localized. It comes and goes. It is not related to any particular activity.  Current Outpatient Prescriptions on File Prior to Visit  Medication Sig Dispense Refill  . albuterol (PROVENTIL HFA;VENTOLIN HFA) 108 (90 BASE) MCG/ACT inhaler Inhale 2 puffs into the lungs every 4 (four) hours as needed for wheezing or shortness of breath.  1 Inhaler  11  . albuterol (PROVENTIL) (2.5 MG/3ML) 0.083% nebulizer solution Take 3 mLs (2.5 mg total) by nebulization every 4 (four) hours as needed for shortness of breath.  75 mL  12  . atenolol (TENORMIN) 50 MG tablet Take 50 mg by mouth 2 (two) times daily.      . dabigatran (PRADAXA) 150 MG CAPS Take 1 capsule (150 mg total) by mouth every 12 (twelve) hours.  60 capsule  5  . furosemide (LASIX) 40 MG tablet Take 1 tablet (40 mg total) by mouth daily.  90 tablet  1  . hydrochlorothiazide (HYDRODIURIL) 25 MG tablet Take 1 tablet (25 mg total) by mouth daily.  60 tablet  6  . LORazepam (ATIVAN) 0.5 MG tablet Take 0.5 mg by mouth as needed.      Marland Kitchen NEXIUM 40 MG capsule TAKE 1 CAPSULE (40 MG TOTAL) BY MOUTH DAILY.  30 capsule  6  . potassium chloride SA (K-DUR,KLOR-CON) 20 MEQ tablet Take 20 mEq by mouth 2 (two) times daily.      Marland Kitchen spironolactone (ALDACTONE) 25 MG tablet Take 1 tablet (25 mg  total) by mouth daily.  30 tablet  3  . tiotropium (SPIRIVA) 18 MCG inhalation capsule Place 1 capsule (18 mcg total) into inhaler and inhale daily.  30 capsule  11   No current facility-administered medications on file prior to visit.    No Known Allergies  Past Medical History  Diagnosis Date  . PAF (paroxysmal atrial fibrillation)   . Chronic anticoagulation   . HTN (hypertension)   . Chronic chest pain   . Obesity   . Chronic back pain   . Emphysema   . Chronic venous insufficiency   . Pneumothorax   . Rheumatoid arthritis     Past Surgical History  Procedure Laterality Date  . Cardiac catheterization  1998    NORMAL  . Hernia repair    . Vein ligation and stripping    . Pleural scarification      History  Smoking status  . Former Smoker -- 2.00 packs/day for 30 years  . Types: Cigarettes  . Quit date: 03/14/1996  Smokeless tobacco  . Never Used    History  Alcohol Use  . Yes    Comment: one beer per month    Family History  Problem Relation Age of Onset  . Other Father     CEREBRAL THROMBOSIS  . Cancer Mother  bone cancer  . Heart failure Brother 50  . Coronary artery disease Brother 17    PTCA  . Rheum arthritis Mother   . Rheum arthritis Brother   . Rheum arthritis Brother     Review of Systems: As noted in history of present illness. All other systems were reviewed and are negative.  Physical Exam: BP 138/64  Pulse 60  Ht 6\' 8"  (2.032 m)  Wt 364 lb 12.8 oz (165.472 kg)  BMI 40.08 kg/m2 He is a pleasant, obese white male. He is in no acute distress. HEENT is unremarkable.Neck is supple. No masses.  Lungs are clear. Cardiac exam shows a regular rate and rhythm. Abdomen is obese. There are no masses or tenderness to palpation. Extremities are full. He has chronic venous stasis disease with chronic brawny edema. Gait and ROM are intact. He has no gross neurologic deficits.   Laboratory data:   Assessment / Plan: 1. Paroxysmal atrial  fibrillation. His is well controlled on amiodarone. We will reduce his amiodarone dose to 100 mg daily. We will stop his diltiazem. We will continue with the atenolol. We will check chemistries and TSH on amiodarone. I will followup again in 6 months.  2. Anticoagulation. Unable to afford Pradaxa. We will transition to Coumadin and establish followup in our Coumadin clinic.  3. Morbid obesity with sleep apnea. I encouraged continue weight loss.  4. Chronic venous insufficiency with severe chronic edema. Continue diuretic therapy with Lasix, HCTZ, and Aldactone. We will check renal function and potassium today.

## 2012-08-24 ENCOUNTER — Ambulatory Visit: Payer: BC Managed Care – HMO | Admitting: Cardiology

## 2012-08-24 ENCOUNTER — Telehealth: Payer: Self-pay | Admitting: *Deleted

## 2012-08-24 DIAGNOSIS — I4891 Unspecified atrial fibrillation: Secondary | ICD-10-CM

## 2012-08-24 MED ORDER — WARFARIN SODIUM 5 MG PO TABS: ORAL_TABLET | ORAL | Status: DC

## 2012-08-24 NOTE — Telephone Encounter (Signed)
Message copied by Burnell Blanks on Tue Aug 24, 2012  3:30 PM ------      Message from: Sw[place]and, PETER M      Created: Mon Aug 23, 2012  5:32 PM       Mildly elevated glucose (nonfasting). Other chemistries are normal. Normal TSH.            Peter Swaziland MD, [facility]             ------

## 2012-08-24 NOTE — Telephone Encounter (Signed)
Advised patient of lab results   Patient is changing from Pradaxa to Warfarin. Discussed with coumadin clinic and per Dr Johney Frame will have patient take Pradaxa and coumadin for 3 days together and after 3 days coumadin only.  Will start coumadin 7.5 mg on day 1 then 5 mg daily until INR check on Monday 08/30/12. Advised patient and verbalized understanding. Rx sent to pharmacy

## 2012-08-30 ENCOUNTER — Ambulatory Visit (INDEPENDENT_AMBULATORY_CARE_PROVIDER_SITE_OTHER): Payer: BC Managed Care – HMO | Admitting: Pharmacist

## 2012-08-30 DIAGNOSIS — Z7901 Long term (current) use of anticoagulants: Secondary | ICD-10-CM

## 2012-08-30 DIAGNOSIS — I480 Paroxysmal atrial fibrillation: Secondary | ICD-10-CM

## 2012-08-30 DIAGNOSIS — I4891 Unspecified atrial fibrillation: Secondary | ICD-10-CM

## 2012-09-06 ENCOUNTER — Ambulatory Visit (INDEPENDENT_AMBULATORY_CARE_PROVIDER_SITE_OTHER): Payer: BC Managed Care – HMO | Admitting: *Deleted

## 2012-09-06 DIAGNOSIS — Z7901 Long term (current) use of anticoagulants: Secondary | ICD-10-CM

## 2012-09-06 DIAGNOSIS — I4891 Unspecified atrial fibrillation: Secondary | ICD-10-CM

## 2012-09-06 LAB — POCT INR: INR: 1.2

## 2012-09-15 ENCOUNTER — Ambulatory Visit (INDEPENDENT_AMBULATORY_CARE_PROVIDER_SITE_OTHER): Payer: BC Managed Care – HMO | Admitting: *Deleted

## 2012-09-15 DIAGNOSIS — I480 Paroxysmal atrial fibrillation: Secondary | ICD-10-CM

## 2012-09-15 DIAGNOSIS — I4891 Unspecified atrial fibrillation: Secondary | ICD-10-CM

## 2012-09-15 DIAGNOSIS — Z7901 Long term (current) use of anticoagulants: Secondary | ICD-10-CM

## 2012-09-15 LAB — POCT INR: INR: 3.4

## 2012-09-24 ENCOUNTER — Ambulatory Visit (INDEPENDENT_AMBULATORY_CARE_PROVIDER_SITE_OTHER): Payer: BC Managed Care – PPO | Admitting: *Deleted

## 2012-09-24 DIAGNOSIS — I480 Paroxysmal atrial fibrillation: Secondary | ICD-10-CM

## 2012-09-24 DIAGNOSIS — Z7901 Long term (current) use of anticoagulants: Secondary | ICD-10-CM

## 2012-09-24 DIAGNOSIS — I4891 Unspecified atrial fibrillation: Secondary | ICD-10-CM

## 2012-09-24 LAB — POCT INR: INR: 4.7

## 2012-09-27 ENCOUNTER — Other Ambulatory Visit: Payer: Self-pay | Admitting: Cardiology

## 2012-09-28 ENCOUNTER — Other Ambulatory Visit: Payer: Self-pay | Admitting: *Deleted

## 2012-09-28 MED ORDER — ATENOLOL 50 MG PO TABS: 50.0000 mg | ORAL_TABLET | Freq: Two times a day (BID) | ORAL | Status: DC

## 2012-10-06 ENCOUNTER — Ambulatory Visit (INDEPENDENT_AMBULATORY_CARE_PROVIDER_SITE_OTHER): Payer: BC Managed Care – PPO | Admitting: *Deleted

## 2012-10-06 DIAGNOSIS — Z7901 Long term (current) use of anticoagulants: Secondary | ICD-10-CM

## 2012-10-06 DIAGNOSIS — I480 Paroxysmal atrial fibrillation: Secondary | ICD-10-CM

## 2012-10-06 DIAGNOSIS — I4891 Unspecified atrial fibrillation: Secondary | ICD-10-CM

## 2012-10-06 LAB — POCT INR: INR: 3.9

## 2012-10-14 ENCOUNTER — Telehealth: Payer: Self-pay | Admitting: Cardiology

## 2012-10-14 NOTE — Telephone Encounter (Signed)
Pt calls today b/c he experienced (3 days ago) a " very painful episode of heart pain right over my heart".  This was a 9pm that night. It was relieved without intervention.  Pt notes that since that episode he appears to be more dyspneic on exertion & at those times he does experience soreness on the left chest nonradiating. States that it is not bad like 3 days ago.  States also that this pain is different from what he has experienced in the past with his arrythymias.  He is painfree today. Will come in tomorrow for an EKG nurse room visit. Same day as Dr. Swaziland is in the office.   Reassurance given. Pt agrees. Mylo Red RN

## 2012-10-14 NOTE — Telephone Encounter (Signed)
New problem    Pt having issues with his heart and wants to speak to someone

## 2012-10-15 ENCOUNTER — Encounter: Payer: Self-pay | Admitting: *Deleted

## 2012-10-15 ENCOUNTER — Ambulatory Visit (INDEPENDENT_AMBULATORY_CARE_PROVIDER_SITE_OTHER): Payer: BC Managed Care – PPO | Admitting: *Deleted

## 2012-10-15 VITALS — BP 130/80 | HR 59 | Ht >= 80 in | Wt 370.8 lb

## 2012-10-15 DIAGNOSIS — R079 Chest pain, unspecified: Secondary | ICD-10-CM

## 2012-10-15 NOTE — Progress Notes (Signed)
Pt comes in to the office today for EKG. EKG reviewed by Dr. Swaziland. Pt remained in sinus rhythm. He has had no chest pain since Monday. He has some dyspnea on exertion that pt states resolves quickly when ceases activity Mylo Red RN

## 2012-10-19 ENCOUNTER — Ambulatory Visit (INDEPENDENT_AMBULATORY_CARE_PROVIDER_SITE_OTHER): Payer: BC Managed Care – PPO | Admitting: *Deleted

## 2012-10-19 DIAGNOSIS — Z7901 Long term (current) use of anticoagulants: Secondary | ICD-10-CM

## 2012-10-19 DIAGNOSIS — I4891 Unspecified atrial fibrillation: Secondary | ICD-10-CM

## 2012-10-19 DIAGNOSIS — I480 Paroxysmal atrial fibrillation: Secondary | ICD-10-CM

## 2012-10-19 LAB — POCT INR: INR: 3.2

## 2012-10-27 ENCOUNTER — Other Ambulatory Visit: Payer: Self-pay | Admitting: *Deleted

## 2012-10-27 DIAGNOSIS — M79609 Pain in unspecified limb: Secondary | ICD-10-CM

## 2012-11-01 ENCOUNTER — Telehealth: Payer: Self-pay | Admitting: Emergency Medicine

## 2012-11-01 NOTE — Telephone Encounter (Signed)
Pt called back again re: same (still nothing avail on any dr's schedule). [name]

## 2012-11-01 NOTE — Telephone Encounter (Signed)
LMTCB

## 2012-11-02 ENCOUNTER — Telehealth: Payer: Self-pay | Admitting: Physician Assistant

## 2012-11-02 ENCOUNTER — Ambulatory Visit (INDEPENDENT_AMBULATORY_CARE_PROVIDER_SITE_OTHER): Payer: BC Managed Care – PPO | Admitting: *Deleted

## 2012-11-02 DIAGNOSIS — I4891 Unspecified atrial fibrillation: Secondary | ICD-10-CM

## 2012-11-02 DIAGNOSIS — I480 Paroxysmal atrial fibrillation: Secondary | ICD-10-CM

## 2012-11-02 DIAGNOSIS — Z7901 Long term (current) use of anticoagulants: Secondary | ICD-10-CM

## 2012-11-02 NOTE — Telephone Encounter (Signed)
Stat lab called to me on call, INR 5.05. Had INR check in clinic earlier at 6.5 so elevated INR is already known. Per clinic note, the patient was instructed not to take any further Coumadin until he hears from the Coumadin clinic. Will forward for their review. Dayna Dunn PA-C

## 2012-11-02 NOTE — Telephone Encounter (Signed)
Spoke with patient.  Patient states he feels much better today, states he is not as sob as he was yesterday. I asked patient did he think he needed to come in today and he stated he did not but would like to come in for ROV w RB. I have scheduled patient. Appt scheduled for Thursday 11/04/12 @ 3pm Patient verbalized nothing further needed.

## 2012-11-04 ENCOUNTER — Ambulatory Visit (INDEPENDENT_AMBULATORY_CARE_PROVIDER_SITE_OTHER): Payer: BC Managed Care – PPO | Admitting: Emergency Medicine

## 2012-11-04 ENCOUNTER — Encounter: Payer: Self-pay | Admitting: Emergency Medicine

## 2012-11-04 ENCOUNTER — Ambulatory Visit (INDEPENDENT_AMBULATORY_CARE_PROVIDER_SITE_OTHER)
Admission: RE | Admit: 2012-11-04 | Discharge: 2012-11-04 | Disposition: A | Discharge status: Home / Self Care | Payer: BC Managed Care – PPO | Source: Ambulatory Visit | Attending: Emergency Medicine | Admitting: Emergency Medicine

## 2012-11-04 VITALS — BP 150/68 | HR 59 | Temp 97.8°F | Ht >= 80 in | Wt 376.0 lb

## 2012-11-04 DIAGNOSIS — J4489 Other specified chronic obstructive pulmonary disease: Secondary | ICD-10-CM

## 2012-11-04 DIAGNOSIS — J449 Chronic obstructive pulmonary disease, unspecified: Secondary | ICD-10-CM

## 2012-11-04 DIAGNOSIS — G4730 Sleep apnea, unspecified: Secondary | ICD-10-CM

## 2012-11-04 DIAGNOSIS — R0609 Other forms of dyspnea: Secondary | ICD-10-CM

## 2012-11-04 DIAGNOSIS — R0600 Dyspnea, unspecified: Secondary | ICD-10-CM

## 2012-11-04 MED ORDER — IPRATROPIUM-ALBUTEROL 0.5-2.5 (3) MG/3ML IN SOLN: 3.0000 mL | Freq: Four times a day (QID) | RESPIRATORY_TRACT | Status: DC

## 2012-11-04 NOTE — Progress Notes (Signed)
Subjective:    Patient ID: [name], male    DOB: [birth year], 67 y.o.   MRN: [id number]   HPI 67 yo man, hx tobacco, Hx RA, followed by Dr Swaziland for HTN, A Fib. OSA on CPAP.  He underwent CPAP titration since last time - recommended BiPAP 18/14 vs CPAP 18 (currently on 14). He wants to change from Spiriva to scheduled Nebs.   ROV 11/10/11 -- OSA on CPAP, COPD, Hx RA. Last time changed spiriva to duonebs tid. He returns c/o exertional sob and cough that started around a month ago. Last 2 -3 days worse, nasal gtt and congestion. He has used SABA more frequently. Has gained 10 lbs since last visit. He has gotten use to the CPAP at higher pressure (18). He stopped nexium a month ago! Replaced with prilosec + pepcid.   ROV 03/11/12 -- OSA on CPAP, COPD, Hx RA. He is on DuoNebs tid. Unable to get nexium, needed prior-auth which is pending. Has been wearing CPAP. He tells me that he is more dyspneic compared with last visit, significant decrease in his exercise tolerance. He had to stop Nexium again about 10 days ago. We changed to nebs a year ago due to cost, and he has never felt as well since that change. He has put another 10 lbs since April. He hears wheezing w exertion. He coughs frequently, productive but he hasn't seen it. No abx or pred since last time.   ROV 07/09/12 -- OSA on CPAP, COPD, Hx RA.  Last time we started Spiriva, changed duonebs to albuterol for prn use. Also restarted nexium. He returns reporting Since last time his dyspnea is worse - he can't recover from a walk as quickly. Started taking Spiriva sporadically about 2 weeks ago. Also having exertional CP, last myoview was April 2012 (no intervention needed).  Having wheezing, cough that is non-prod. Increased B LE edema over lat 3 mon. Has gained 8 lbs since last visit.   ROV 11/04/12 -- OSA reliable on CPAP 18, COPD, Hx RA. Also followed by Dr Swaziland for A Fib, rate control, volume status. Last time we stopped spiriva due to cost,  restarted duonebs qid > he says that he is only getting the albuterol component. He tells me he was recently rx with indomethacin for gout, caused epistaxis, had to have his coumadin adjusted.  He is having exertional SOB, difficulty taking a breath in.  Freq cough, ? productive  [employer] Allegra is DME > says he needs new nasal pillows, current one in poor repair. Also needs atrovent added to his nebs.      Objective:   Physical Exam Filed Vitals:   11/04/12 1446  BP: 150/68  Pulse: 59  Temp: 97.8 F (36.6 C)   Gen: Pleasant, obese, in no distress,  normal affect  ENT: No lesions,  mouth clear,  oropharynx clear, no postnasal drip  Neck: No JVD, no TMG, no carotid bruits  Lungs: No use of accessory muscles, no dullness to percussion, clear without rales or rhonchi  Cardiovascular: RRR, systolic M,  Musculoskeletal: No deformities, no cyanosis or clubbing  Neuro: alert, non focal  Skin: Warm, no lesions or rashes   Assessment & Plan:   COPD (chronic obstructive pulmonary disease) Change scheduled albuterol nebs to duonebs qid + SABA prn through [facility] CXR today  Sleep apnea - continue CPAP qhs - get nasal pillows repaired by [facility]  Dyspnea Suspect much of this is restrictive disease from obesity - insure max  treatment of his COPD - CXR to r/o elevated HD, infiltrate or PTX (highly doubt, but the pt is concerned about this)

## 2012-11-04 NOTE — Assessment & Plan Note (Signed)
Suspect much of this is restrictive disease from obesity - insure max treatment of his COPD - CXR to r/o elevated HD, infiltrate or PTX (highly doubt, but the pt is concerned about this)

## 2012-11-04 NOTE — Assessment & Plan Note (Signed)
-   continue CPAP qhs - get nasal pillows repaired by [facility] Allegra

## 2012-11-04 NOTE — Patient Instructions (Addendum)
We will convert your albuterol nebulizers to combined albuterol/atrovent nebulizers 4 x a day We will get your CPAP nasal pillows mask repaired or replaced CXR today Follow with Dr Delton Coombes in 4 months or sooner if you have any problems.

## 2012-11-04 NOTE — Assessment & Plan Note (Addendum)
Change scheduled albuterol nebs to duonebs qid + SABA prn through [facility] CXR today

## 2012-11-05 ENCOUNTER — Telehealth: Payer: Self-pay | Admitting: Emergency Medicine

## 2012-11-05 DIAGNOSIS — G4730 Sleep apnea, unspecified: Secondary | ICD-10-CM

## 2012-11-05 NOTE — Telephone Encounter (Signed)
Spoke with patient and Crystal @ [facility] Patient needs order sent to [facility] for "cpap supplies" Order has been placed and nothing further needed at this time.

## 2012-11-09 ENCOUNTER — Ambulatory Visit: Payer: BC Managed Care – PPO | Admitting: Vascular Surgery

## 2012-11-09 ENCOUNTER — Telehealth: Payer: Self-pay | Admitting: Emergency Medicine

## 2012-11-09 NOTE — Telephone Encounter (Signed)
Spoke with patient , he states [facility] did not receive order for CPAP supplies According to our records orders were placed and faxed per Leotis Shames and Almyra Free I have printed out ordere and Refaxed to [facility] at number below and again to [phone] Patient aware and nothing further needed at this time

## 2012-11-10 ENCOUNTER — Telehealth: Payer: Self-pay | Admitting: Emergency Medicine

## 2012-11-10 NOTE — Telephone Encounter (Signed)
This is the cheapest BD combo available. Would it be cheaper if he got it thru his DME company? What about another DME company. I will forward this to [facility] box to get their input about least expensive option

## 2012-11-10 NOTE — Telephone Encounter (Signed)
Spoke with pt He states that albuterol/ipratropium is covered by ins, but the copay of 138$ is too much for him They did not advise on any cheaper med Please advise thanks!

## 2012-11-11 MED ORDER — IPRATROPIUM-ALBUTEROL 0.5-2.5 (3) MG/3ML IN SOLN: 3.0000 mL | Freq: Four times a day (QID) | RESPIRATORY_TRACT | Status: DC

## 2012-11-11 NOTE — Telephone Encounter (Signed)
Called and spoke with patient and he is currently obtaining his medications through [facility]. Advised patient that I wanted to try to see if [facility] could provide the nebulizer medications cheaper for him. I advised patient that once [facility] contacted him to discuss cost of meds to ask if they had a financial assistance program that would help him with the cost. Advised patient to let us know if this company was able to help him or not.  I need a Rx printed for the nebulizer medication. Rhonda J Cobb

## 2012-11-11 NOTE — Telephone Encounter (Signed)
Rx printed and given to Lauren to have RB sign this PM when he is in the office. Give rx to [place] once signed. Thanks. Carron Curie, CMA

## 2012-11-12 ENCOUNTER — Ambulatory Visit (INDEPENDENT_AMBULATORY_CARE_PROVIDER_SITE_OTHER): Payer: BC Managed Care – PPO | Admitting: Pharmacist

## 2012-11-12 DIAGNOSIS — I480 Paroxysmal atrial fibrillation: Secondary | ICD-10-CM

## 2012-11-12 DIAGNOSIS — Z7901 Long term (current) use of anticoagulants: Secondary | ICD-10-CM

## 2012-11-12 DIAGNOSIS — I4891 Unspecified atrial fibrillation: Secondary | ICD-10-CM

## 2012-11-12 LAB — POCT INR: INR: 1.9

## 2012-11-12 NOTE — Telephone Encounter (Signed)
I have given signed Rx to [place].

## 2012-11-26 ENCOUNTER — Ambulatory Visit (INDEPENDENT_AMBULATORY_CARE_PROVIDER_SITE_OTHER): Payer: BC Managed Care – PPO | Admitting: *Deleted

## 2012-11-26 DIAGNOSIS — Z7901 Long term (current) use of anticoagulants: Secondary | ICD-10-CM

## 2012-11-26 DIAGNOSIS — I4891 Unspecified atrial fibrillation: Secondary | ICD-10-CM

## 2012-11-26 DIAGNOSIS — I480 Paroxysmal atrial fibrillation: Secondary | ICD-10-CM

## 2012-11-26 LAB — POCT INR: INR: 4

## 2012-11-29 ENCOUNTER — Encounter: Payer: Self-pay | Admitting: Vascular Surgery

## 2012-11-30 ENCOUNTER — Encounter: Payer: Self-pay | Admitting: Vascular Surgery

## 2012-11-30 ENCOUNTER — Encounter (INDEPENDENT_AMBULATORY_CARE_PROVIDER_SITE_OTHER): Payer: BC Managed Care – PPO | Admitting: *Deleted

## 2012-11-30 ENCOUNTER — Ambulatory Visit (INDEPENDENT_AMBULATORY_CARE_PROVIDER_SITE_OTHER): Payer: BC Managed Care – PPO | Admitting: Vascular Surgery

## 2012-11-30 VITALS — BP 136/54 | HR 53 | Ht >= 80 in | Wt 358.0 lb

## 2012-11-30 DIAGNOSIS — M79609 Pain in unspecified limb: Secondary | ICD-10-CM

## 2012-11-30 DIAGNOSIS — I83893 Varicose veins of bilateral lower extremities with other complications: Secondary | ICD-10-CM | POA: Insufficient documentation

## 2012-11-30 DIAGNOSIS — Z48812 Encounter for surgical aftercare following surgery on the circulatory system: Secondary | ICD-10-CM

## 2012-11-30 NOTE — Progress Notes (Signed)
Subjective:     Patient ID: [name], male   DOB: September 07, 1945, 67 y.o.   MRN: [id number]  HPI this 67 year old male returns for followup regarding his chronic venous insufficiency. In 2008 he had staged laser ablation procedures of both great saphenous veins. He has skin consistent with severe chronic venous insufficiency. He has continued to have chronic edema since his procedures. Both veins were documented to be closed following laser ablation. He is continued to have thickening and dark skin and has had a few ulcers in the left ankle over the years but these have healed. He denies claudication. He does wear short-legged elastic compression stockings but is unable to elevate his legs at night because he has sleep apnea and uses a CPAP machine.  Past Medical History  Diagnosis Date  . PAF (paroxysmal atrial fibrillation)   . Chronic anticoagulation   . HTN (hypertension)   . Chronic chest pain   . Obesity   . Chronic back pain   . Emphysema   . Chronic venous insufficiency   . Pneumothorax   . Rheumatoid arthritis     History  Substance Use Topics  . Smoking status: Former Smoker -- 2.00 packs/day for 30 years    Types: Cigarettes    Quit date: 03/14/1996  . Smokeless tobacco: Never Used  . Alcohol Use: Yes     Comment: one beer per month    Family History  Problem Relation Age of Onset  . Other Father     CEREBRAL THROMBOSIS  . Cancer Mother     bone cancer  . Heart failure Brother 50  . Coronary artery disease Brother 98    PTCA  . Rheum arthritis Mother   . Rheum arthritis Brother   . Rheum arthritis Brother     No Known Allergies  Current outpatient prescriptions:albuterol (PROVENTIL HFA;VENTOLIN HFA) 108 (90 BASE) MCG/ACT inhaler, Inhale 2 puffs into the lungs every 4 (four) hours as needed for wheezing or shortness of breath., Disp: 1 Inhaler, Rfl: 11;  albuterol (PROVENTIL) (2.5 MG/3ML) 0.083% nebulizer solution, Take 3 mLs (2.5 mg total) by nebulization every 4  (four) hours as needed for shortness of breath., Disp: 75 mL, Rfl: 12 amiodarone (PACERONE) 200 MG tablet, Take 1 tablet (200 mg total) by mouth daily., Disp: 90 tablet, Rfl: 3;  atenolol (TENORMIN) 50 MG tablet, Take 1 tablet (50 mg total) by mouth 2 (two) times daily., Disp: 60 tablet, Rfl: 5;  furosemide (LASIX) 40 MG tablet, Take 1 tablet (40 mg total) by mouth daily., Disp: 90 tablet, Rfl: 1;  hydrochlorothiazide (HYDRODIURIL) 25 MG tablet, Take 1 tablet (25 mg total) by mouth daily., Disp: 60 tablet, Rfl: 6 LORazepam (ATIVAN) 0.5 MG tablet, Take 0.5 mg by mouth as needed., Disp: , Rfl: ;  NEXIUM 40 MG capsule, TAKE 1 CAPSULE (40 MG TOTAL) BY MOUTH DAILY., Disp: 30 capsule, Rfl: 6;  potassium chloride SA (K-DUR,KLOR-CON) 20 MEQ tablet, Take 20 mEq by mouth 2 (two) times daily., Disp: , Rfl: ;  spironolactone (ALDACTONE) 25 MG tablet, Take 1 tablet (25 mg total) by mouth daily., Disp: 30 tablet, Rfl: 3 tiotropium (SPIRIVA) 18 MCG inhalation capsule, Place 1 capsule (18 mcg total) into inhaler and inhale daily., Disp: 30 capsule, Rfl: 11;  warfarin (COUMADIN) 5 MG tablet, Daily or as directed, Disp: 30 tablet, Rfl: 3;  ipratropium-albuterol (DUONEB) 0.5-2.5 (3) MG/3ML SOLN, Take 3 mLs by nebulization 4 (four) times daily., Disp: 360 mL, Rfl: 11  BP 136/54  Pulse 53  Ht 6\' 8"  (2.032 m)  Wt 358 lb (162.388 kg)  BMI 39.33 kg/m2  SpO2 96%  Body mass index is 39.33 kg/(m^2).           Review of Systems complains of history of atrial fibrillation, orthopnea, dyspnea on exertion, leg pain with walking, productive cough, wheezing, weakness in his arms and legs, numbness in his feet. Other systems negative and complete review of systems     Objective:   Physical Exam pressure 136/54 heart rate 83 respirations 18 Gen.-alert and oriented x3 in no apparent distress-obese HEENT normal for age Lungs no rhonchi or wheezing Cardiovascular regular rhythm no murmurs carotid pulses 3+ palpable no  bruits audible Abdomen soft nontender no palpable masses Musculoskeletal free of  major deformities Skin clear -no rashes Neurologic normal Lower extremities 3+ femoral and dorsalis pedis pulses palpable bilaterally Both lower extremities with severe hyperpigmentation thickening of skin from mid calf to foot. No active ulcers noted. No large bulging varicosities noted. Chronic 1-2+ edema.  Data were bilateral arterial lower extremity studies a venous duplex exam of the left leg. Arterial studies are normal with normal ABIs and triphasic waveforms. 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 There is no DVT but there is severe deep vein reflux.       Assessment:     Chronic edema and venous insufficiency bilaterally due to deep venous reflux-status post bilateral great saphenous vein ablations in 2008 Partial recanalization of left great saphenous vein today compared to post ablation ultrasound in 2008-do not think this is clinically relevant    Plan:     #1 elevate foot of bed at night if at all feasible #2 short-leg elastic compression stockings at all times during the day #3 no further vascular evaluation indicated

## 2012-12-10 ENCOUNTER — Ambulatory Visit (INDEPENDENT_AMBULATORY_CARE_PROVIDER_SITE_OTHER): Payer: BC Managed Care – PPO | Admitting: *Deleted

## 2012-12-10 DIAGNOSIS — Z7901 Long term (current) use of anticoagulants: Secondary | ICD-10-CM

## 2012-12-10 DIAGNOSIS — I480 Paroxysmal atrial fibrillation: Secondary | ICD-10-CM

## 2012-12-10 DIAGNOSIS — I4891 Unspecified atrial fibrillation: Secondary | ICD-10-CM

## 2012-12-10 LAB — POCT INR: INR: 6.2

## 2012-12-13 ENCOUNTER — Other Ambulatory Visit: Payer: Self-pay | Admitting: *Deleted

## 2012-12-13 ENCOUNTER — Ambulatory Visit (INDEPENDENT_AMBULATORY_CARE_PROVIDER_SITE_OTHER): Payer: BC Managed Care – PPO | Admitting: *Deleted

## 2012-12-13 DIAGNOSIS — I480 Paroxysmal atrial fibrillation: Secondary | ICD-10-CM

## 2012-12-13 DIAGNOSIS — I4891 Unspecified atrial fibrillation: Secondary | ICD-10-CM

## 2012-12-13 DIAGNOSIS — Z7901 Long term (current) use of anticoagulants: Secondary | ICD-10-CM

## 2012-12-13 MED ORDER — SPIRONOLACTONE 25 MG PO TABS: 25.0000 mg | ORAL_TABLET | Freq: Every day | ORAL | Status: DC

## 2012-12-30 ENCOUNTER — Other Ambulatory Visit: Payer: Self-pay | Admitting: Cardiology

## 2012-12-30 NOTE — Telephone Encounter (Signed)
potassium chloride SA (K-DUR,KLOR-CON) 20 MEQ tablet  Take 20 mEq by mouth 2 (two) times daily.      Peter M Swaziland, MD at 08/23/2012  2:42 PM

## 2013-01-03 ENCOUNTER — Ambulatory Visit (INDEPENDENT_AMBULATORY_CARE_PROVIDER_SITE_OTHER): Payer: BC Managed Care – PPO | Admitting: Pharmacist

## 2013-01-03 DIAGNOSIS — I480 Paroxysmal atrial fibrillation: Secondary | ICD-10-CM

## 2013-01-03 DIAGNOSIS — Z7901 Long term (current) use of anticoagulants: Secondary | ICD-10-CM

## 2013-01-03 DIAGNOSIS — I4891 Unspecified atrial fibrillation: Secondary | ICD-10-CM

## 2013-01-03 LAB — POCT INR: INR: 2.1

## 2013-01-13 ENCOUNTER — Other Ambulatory Visit: Payer: Self-pay | Admitting: Cardiology

## 2013-01-18 ENCOUNTER — Telehealth: Payer: Self-pay | Admitting: Emergency Medicine

## 2013-01-18 NOTE — Telephone Encounter (Signed)
Called, spoke with pt.  Reports "I have fluid on my lungs."  Pt states he has a prod cough with brownish mucus and increased SOB after coughing with "a lot" of wheezing.  Symptoms started x 2-3 wks ago.  Denies chest tightness, chest pain, or f/c/s.  He has tried cloracidin with some relief and is using albuterol hfa and albuterol nebs with relief.  We have scheduled pt to see [facility] on tomorrow, July 9 at 3:15.  Pt aware and ok with this.

## 2013-01-19 ENCOUNTER — Encounter: Payer: Self-pay | Admitting: Pulmonary Disease

## 2013-01-19 ENCOUNTER — Ambulatory Visit (INDEPENDENT_AMBULATORY_CARE_PROVIDER_SITE_OTHER): Payer: BC Managed Care – PPO | Admitting: Pulmonary Disease

## 2013-01-19 VITALS — BP 142/72 | HR 62 | Temp 98.3°F | Ht >= 80 in | Wt 373.6 lb

## 2013-01-19 DIAGNOSIS — J441 Chronic obstructive pulmonary disease with (acute) exacerbation: Secondary | ICD-10-CM | POA: Insufficient documentation

## 2013-01-19 MED ORDER — DOXYCYCLINE HYCLATE 100 MG PO TABS: 100.0000 mg | ORAL_TABLET | Freq: Two times a day (BID) | ORAL | Status: DC

## 2013-01-19 MED ORDER — PREDNISONE 10 MG PO TABS: ORAL_TABLET | ORAL | Status: DC

## 2013-01-19 NOTE — Patient Instructions (Addendum)
Will treat with a course of prednisone over 8 days. Doxycycline 100mg  one in am and pm for 7 days.  Try mucinex dm 2 tabs in am and pm with large glass of water for one week to help thin mucus (over the counter). Keep followup apptm with Dr. Delton Coombes, but call us if you are not improving.

## 2013-01-19 NOTE — Addendum Note (Signed)
Addended by: Nita Sells on: 01/19/2013 03:51 PM   Modules accepted: Orders

## 2013-01-19 NOTE — Progress Notes (Signed)
  Subjective:    Patient ID: [name], male    DOB: [birth year], 67 y.o.   MRN: [id number]  HPI The patient comes in today for an acute sick visit.  He has known COPD, and is normally followed by Dr. Delton Coombes.  He gives a 3 to four-week history of increasing chest congestion, cough with rattling mucus, but is unable to produce the mucus effectively.  He feels that he is getting a chest cold.  He also notes increasing shortness of breath despite staying compliant on his nebulized bronchodilators.  He has not had any fevers, chills, or sweats.   Review of Systems  Constitutional: Positive for fever. Negative for unexpected weight change.  HENT: Positive for congestion and voice change. Negative for ear pain, nosebleeds, sore throat, rhinorrhea, sneezing, trouble swallowing, dental problem, postnasal drip and sinus pressure.   Eyes: Negative for redness and itching.  Respiratory: Positive for cough ( congestion) and shortness of breath. Negative for chest tightness and wheezing.   Cardiovascular: Positive for chest pain ( pt states d/t heart). Negative for palpitations and leg swelling.  Gastrointestinal: Negative for nausea and vomiting.  Genitourinary: Negative for dysuria.  Musculoskeletal: Negative for joint swelling.  Skin: Negative for rash.  Neurological: Negative for headaches.  Hematological: Does not bruise/bleed easily.  Psychiatric/Behavioral: Negative for dysphoric mood. The patient is not nervous/anxious.        Objective:   Physical Exam Morbidly obese male in no acute distress Nose without purulence or discharge noted Oropharynx clear Neck without lymphadenopathy or thyromegaly Chest with a few basilar crackles, decreased breath sounds diffusely, and isolated wheeze in the right lower lung zone posteriorly. Cardiac exam with regular rate and rhythm Lower extremities with 2-3+ edema bilaterally, no cyanosis Alert and oriented, moves all 4 extremities.       Assessment &  Plan:

## 2013-01-19 NOTE — Assessment & Plan Note (Signed)
The patient is describing a COPD exacerbation, with increasing congestion and cough, as well as increased shortness of breath.  We'll treat him with a short course of prednisone and an antibiotic, and I have asked him to try Mucinex to help with pulmonary hygiene.  He is to call us if he is not seeing improvement in the next 2-3 days.

## 2013-01-31 ENCOUNTER — Ambulatory Visit (INDEPENDENT_AMBULATORY_CARE_PROVIDER_SITE_OTHER): Payer: BC Managed Care – PPO | Admitting: *Deleted

## 2013-01-31 DIAGNOSIS — I480 Paroxysmal atrial fibrillation: Secondary | ICD-10-CM

## 2013-01-31 DIAGNOSIS — Z7901 Long term (current) use of anticoagulants: Secondary | ICD-10-CM

## 2013-01-31 DIAGNOSIS — I4891 Unspecified atrial fibrillation: Secondary | ICD-10-CM

## 2013-02-01 ENCOUNTER — Other Ambulatory Visit: Payer: Self-pay | Admitting: Cardiology

## 2013-02-03 NOTE — Telephone Encounter (Signed)
Patient called he stated he is taking Diltiazem 180 mg daily.Follow up appointment scheduled with Dr.[name] 04/14/13.

## 2013-02-28 ENCOUNTER — Ambulatory Visit (INDEPENDENT_AMBULATORY_CARE_PROVIDER_SITE_OTHER): Payer: BC Managed Care – PPO

## 2013-02-28 DIAGNOSIS — I4891 Unspecified atrial fibrillation: Secondary | ICD-10-CM

## 2013-02-28 DIAGNOSIS — Z7901 Long term (current) use of anticoagulants: Secondary | ICD-10-CM

## 2013-02-28 DIAGNOSIS — I480 Paroxysmal atrial fibrillation: Secondary | ICD-10-CM

## 2013-02-28 LAB — POCT INR: INR: 3.4

## 2013-03-04 ENCOUNTER — Encounter: Payer: Self-pay | Admitting: Emergency Medicine

## 2013-03-04 ENCOUNTER — Ambulatory Visit (INDEPENDENT_AMBULATORY_CARE_PROVIDER_SITE_OTHER): Payer: BC Managed Care – PPO | Admitting: Emergency Medicine

## 2013-03-04 VITALS — BP 140/72 | HR 66 | Temp 98.4°F | Ht >= 80 in | Wt 372.4 lb

## 2013-03-04 DIAGNOSIS — G4730 Sleep apnea, unspecified: Secondary | ICD-10-CM

## 2013-03-04 DIAGNOSIS — J449 Chronic obstructive pulmonary disease, unspecified: Secondary | ICD-10-CM

## 2013-03-04 NOTE — Progress Notes (Signed)
Subjective:    Patient ID: [name], male    DOB: 06/12/1946, 67 y.o.   MRN: [id number]   HPI 67 yo man, hx tobacco, Hx RA, followed by Dr [place] for HTN, A Fib. OSA on CPAP.  He underwent CPAP titration since last time - recommended BiPAP 18/14 vs CPAP 18 (currently on 14). He wants to change from Spiriva to scheduled Nebs.   ROV 11/10/11 -- OSA on CPAP, COPD, Hx RA. Last time changed spiriva to duonebs tid. He returns c/o exertional sob and cough that started around a month ago. Last 2 -3 days worse, nasal gtt and congestion. He has used SABA more frequently. Has gained 10 lbs since last visit. He has gotten use to the CPAP at higher pressure (18). He stopped nexium a month ago! Replaced with prilosec + pepcid.   ROV 03/11/12 -- OSA on CPAP, COPD, Hx RA. He is on DuoNebs tid. Unable to get nexium, needed prior-auth which is pending. Has been wearing CPAP. He tells me that he is more dyspneic compared with last visit, significant decrease in his exercise tolerance. He had to stop Nexium again about 10 days ago. We changed to nebs a year ago due to cost, and he has never felt as well since that change. He has put another 10 lbs since April. He hears wheezing w exertion. He coughs frequently, productive but he hasn't seen it. No abx or pred since last time.   ROV 07/09/12 -- OSA on CPAP, COPD, Hx RA.  Last time we started Spiriva, changed duonebs to albuterol for prn use. Also restarted nexium. He returns reporting Since last time his dyspnea is worse - he can't recover from a walk as quickly. Started taking Spiriva sporadically about 2 weeks ago. Also having exertional CP, last myoview was April 2012 (no intervention needed).  Having wheezing, cough that is non-prod. Increased B LE edema over lat 3 mon. Has gained 8 lbs since last visit.   ROV 11/04/12 -- OSA reliable on CPAP 18, COPD, Hx RA. Also followed by Dr [place] for A Fib, rate control, volume status. Last time we stopped spiriva due to cost,  restarted duonebs qid > he says that he is only getting the albuterol component. He tells me he was recently rx with indomethacin for gout, caused epistaxis, had to have his coumadin adjusted.  He is having exertional SOB, difficulty taking a breath in.  Freq cough, ? productive  Christoper Allegra is DME > says he needs new nasal pillows, current one in poor repair. Also needs atrovent added to his nebs.   ROV 02/12/13 -- OSA reliable on CPAP 18, COPD, Hx RA, A Fib. Was seen by Dr Shelle Iron in 7/14 for an AE-COPD. Returns today improved after pred and doxy. He had a recent fall, ? Why. Didn't lose consciousness. Continues to do nebs on a schedule. He needs a new CPAP mask, but [employer] doesn't have his current PSG data.      Objective:   Physical Exam Filed Vitals:   03/04/13 1615  BP: 140/72  Pulse: 66  Temp: 98.4 F (36.9 C)   Gen: Pleasant, obese, in no distress,  normal affect  ENT: No lesions,  mouth clear,  oropharynx clear, no postnasal drip  Neck: No JVD, no TMG, no carotid bruits  Lungs: No use of accessory muscles, distant, clear without rales or rhonchi  Cardiovascular: RRR, systolic M,  Musculoskeletal: No deformities, no cyanosis or clubbing  Neuro: alert, non focal  Skin: Warm, no lesions or rashes   Assessment & Plan:   Sleep apnea Will forward PSG from 2012 to DME so he can get his new nasal pillows mask  COPD (chronic obstructive pulmonary disease) Continue scheduled nebs. Improved from AE

## 2013-03-04 NOTE — Assessment & Plan Note (Signed)
Continue scheduled nebs. Improved from AE

## 2013-03-04 NOTE — Patient Instructions (Addendum)
We will work on getting a new CPAP mask. Your sleep study from 2012 will be forwarded to your DME company Please continue your nebulizers on a schedule as you a have been using them Follow with Dr Delton Coombes in 4 months or sooner if you have any problems.

## 2013-03-04 NOTE — Assessment & Plan Note (Signed)
Will forward PSG from 2012 to DME so he can get his new nasal pillows mask

## 2013-03-04 NOTE — Addendum Note (Signed)
Addended by: Orma Flaming D on: 03/04/2013 05:00 PM   Modules accepted: Orders

## 2013-03-08 ENCOUNTER — Telehealth: Payer: Self-pay | Admitting: Emergency Medicine

## 2013-03-08 NOTE — Telephone Encounter (Signed)
LMOM x 1 (need to get details from patient)  Will check with Almyra Free to see if she is aware. Pt told front staff that "order was not signed" that was faxed to DME.

## 2013-03-09 ENCOUNTER — Telehealth: Payer: Self-pay | Admitting: Emergency Medicine

## 2013-03-09 NOTE — Telephone Encounter (Signed)
Checking with HIM to see if old sleep study is in patients chart. Rhonda J Cobb

## 2013-03-09 NOTE — Telephone Encounter (Signed)
I spoke with pt and he stated [facility] told him he did not have the correct sleep study done. Pt stated he has been on CPAP for while. Order sent 03/04/13 was for new CPAP and supplies.   I called and spoke with [facility]. I was advised they have the CPAP titration study done on CPAP but they have no baseline sleep study. I have looked in pt chart and did not see a baseline study. Also they stated they order sent to them was not signed by RB. They will need this resent. I spoke with Bjorn Loser and she stated she will call and speak with [facility] and if have to order paper chart.

## 2013-03-09 NOTE — Telephone Encounter (Signed)
Located NPSG in epic from 09/29/2006 and faxed this to [facility] (interpretation). HIM has order the chart from the warehouse to obtain the complete study. Spoke with Okey Regal at [place] and advised that I could fax this now and the complete study once the chart has been sent over. Okey Regal stated that this was fine. Called and spoke with patient and he is aware of the above. Advised patient that if he had any additional issues to please contact us. Rhonda J Cobb

## 2013-03-09 NOTE — Telephone Encounter (Signed)
Spoke to [email or web address]  [email or web address] will be set up Thursday he is bring the supplies he has so they can see what he needs now Tobe Sos

## 2013-03-09 NOTE — Telephone Encounter (Signed)
lmtcb with pt [name] ° °

## 2013-03-11 ENCOUNTER — Encounter: Payer: Self-pay | Admitting: Emergency Medicine

## 2013-03-28 ENCOUNTER — Other Ambulatory Visit: Payer: Self-pay | Admitting: Cardiology

## 2013-03-28 ENCOUNTER — Ambulatory Visit (INDEPENDENT_AMBULATORY_CARE_PROVIDER_SITE_OTHER): Payer: BC Managed Care – PPO | Admitting: Pharmacist

## 2013-03-28 DIAGNOSIS — I4891 Unspecified atrial fibrillation: Secondary | ICD-10-CM

## 2013-03-28 DIAGNOSIS — I480 Paroxysmal atrial fibrillation: Secondary | ICD-10-CM

## 2013-03-28 DIAGNOSIS — Z7901 Long term (current) use of anticoagulants: Secondary | ICD-10-CM

## 2013-03-28 LAB — POCT INR: INR: 2.8

## 2013-04-14 ENCOUNTER — Ambulatory Visit (INDEPENDENT_AMBULATORY_CARE_PROVIDER_SITE_OTHER): Payer: BC Managed Care – PPO | Admitting: Cardiology

## 2013-04-14 ENCOUNTER — Encounter: Payer: Self-pay | Admitting: Cardiology

## 2013-04-14 ENCOUNTER — Ambulatory Visit (INDEPENDENT_AMBULATORY_CARE_PROVIDER_SITE_OTHER): Payer: BC Managed Care – PPO | Admitting: General Practice

## 2013-04-14 VITALS — BP 130/58 | HR 65 | Ht >= 80 in | Wt 364.0 lb

## 2013-04-14 DIAGNOSIS — Z7901 Long term (current) use of anticoagulants: Secondary | ICD-10-CM

## 2013-04-14 DIAGNOSIS — I480 Paroxysmal atrial fibrillation: Secondary | ICD-10-CM

## 2013-04-14 DIAGNOSIS — I4891 Unspecified atrial fibrillation: Secondary | ICD-10-CM

## 2013-04-14 DIAGNOSIS — J449 Chronic obstructive pulmonary disease, unspecified: Secondary | ICD-10-CM

## 2013-04-14 MED ORDER — AMIODARONE HCL 200 MG PO TABS: 100.0000 mg | ORAL_TABLET | Freq: Every day | ORAL | Status: DC

## 2013-04-14 NOTE — Progress Notes (Signed)
History of Present Illness: [name] is seen today for follow up. He has a history of recurrent atrial fibrillation. He has been on chronic amiodarone therapy since September of 2011. He states this has worked very well for him and he has had very infrequent episodes of arrhythmia. His major complaint today is of increased chest congestion and wheezing over the past 4 weeks. He was seen by pulmonary in July and treated with antibiotics and a steroid taper for exacerbation. He does have a nonproductive cough.  Current Outpatient Prescriptions on File Prior to Visit  Medication Sig Dispense Refill  . albuterol (PROVENTIL HFA;VENTOLIN HFA) 108 (90 BASE) MCG/ACT inhaler Inhale 2 puffs into the lungs every 4 (four) hours as needed for wheezing or shortness of breath.  1 Inhaler  11  . albuterol (PROVENTIL) (2.5 MG/3ML) 0.083% nebulizer solution Take 3 mLs (2.5 mg total) by nebulization every 4 (four) hours as needed for shortness of breath.  75 mL  12  . amiodarone (PACERONE) 200 MG tablet Take 1 tablet (200 mg total) by mouth daily.  90 tablet  3  . atenolol (TENORMIN) 50 MG tablet TAKE 1 TABLET (50 MG TOTAL) BY MOUTH 2 (TWO) TIMES DAILY.  60 tablet  4  . diltiazem (CARDIZEM CD) 180 MG 24 hr capsule TAKE 1 CAPSULE (180 MG TOTAL) BY MOUTH DAILY.  30 capsule  6  . furosemide (LASIX) 40 MG tablet TAKE 1 TABLET (40 MG TOTAL) BY MOUTH DAILY.  90 tablet  1  . hydrochlorothiazide (HYDRODIURIL) 25 MG tablet Take 1 tablet (25 mg total) by mouth daily.  60 tablet  6  . ipratropium-albuterol (DUONEB) 0.5-2.5 (3) MG/3ML SOLN Take 3 mLs by nebulization 4 (four) times daily.  360 mL  11  . LORazepam (ATIVAN) 0.5 MG tablet Take 0.5 mg by mouth as needed.      . potassium chloride SA (K-DUR,KLOR-CON) 20 MEQ tablet Take 20 mEq by mouth 2 (two) times daily.      Marland Kitchen spironolactone (ALDACTONE) 25 MG tablet Take 1 tablet (25 mg total) by mouth daily.  30 tablet  9  . warfarin (COUMADIN) 5 MG tablet TAKE DAILY OR AS DIRECTED  30  tablet  3   No current facility-administered medications on file prior to visit.    No Known Allergies  Past Medical History  Diagnosis Date  . PAF (paroxysmal atrial fibrillation)   . Chronic anticoagulation   . HTN (hypertension)   . Chronic chest pain   . Obesity   . Chronic back pain   . Emphysema   . Chronic venous insufficiency   . Pneumothorax   . Rheumatoid arthritis(714.0)     Past Surgical History  Procedure Laterality Date  . Cardiac catheterization  1998    NORMAL  . Hernia repair    . Vein ligation and stripping    . Pleural scarification      History  Smoking status  . Former Smoker -- 2.00 packs/day for 30 years  . Types: Cigarettes  . Quit date: 03/14/1996  Smokeless tobacco  . Never Used    History  Alcohol Use  . Yes    Comment: one beer per month    Family History  Problem Relation Age of Onset  . Other Father     CEREBRAL THROMBOSIS  . Cancer Mother     bone cancer  . Heart failure Brother 50  . Coronary artery disease Brother 33    PTCA  . Rheum arthritis Mother   .  Rheum arthritis Brother   . Rheum arthritis Brother     Review of Systems: As noted in history of present illness. All other systems were reviewed and are negative.  Physical Exam: BP 130/58  Pulse 65  Ht 6\' 8"  (2.032 m)  Wt 364 lb (165.109 kg)  BMI 39.99 kg/m2 He is a pleasant, obese white male. He is in no acute distress. HEENT is unremarkable.Neck is supple. No masses.  Lungs reveal coarse expiratory wheezes.. Cardiac exam shows a regular rate and rhythm. Abdomen is obese. There are no masses or tenderness to palpation. Extremities are full. He has chronic venous stasis disease with chronic brawny edema. Gait and ROM are intact. He has no gross neurologic deficits.   Laboratory data:   ECG today demonstrates normal sinus rhythm with first-degree AV block. Rate is 65 beats per minute. There is probably progression in the precordial leads. He has a left axis  deviation.  Assessment / Plan: 1. Paroxysmal atrial fibrillation. His is well controlled on amiodarone. We will continue amiodarone 100 mg daily and atenolol. I'll followup again in 6 months.  2. Anticoagulation. Therapeutic on Coumadin with an INR 2.4.  3. Morbid obesity with sleep apnea. I encouraged continue weight loss.  4. Chronic venous insufficiency with severe chronic edema. Continue diuretic therapy with Lasix, HCTZ, and Aldactone.   5. COPD/emphysema with some exacerbation. He is scheduled to see his primary care tomorrow. May need to consider repeat steroid taper.

## 2013-04-14 NOTE — Patient Instructions (Addendum)
Continue your current cardiac care  I will see you in 6 months.

## 2013-05-16 ENCOUNTER — Ambulatory Visit (INDEPENDENT_AMBULATORY_CARE_PROVIDER_SITE_OTHER): Payer: BC Managed Care – PPO | Admitting: *Deleted

## 2013-05-16 DIAGNOSIS — I4891 Unspecified atrial fibrillation: Secondary | ICD-10-CM

## 2013-05-16 DIAGNOSIS — I480 Paroxysmal atrial fibrillation: Secondary | ICD-10-CM

## 2013-05-16 DIAGNOSIS — Z7901 Long term (current) use of anticoagulants: Secondary | ICD-10-CM

## 2013-05-24 ENCOUNTER — Other Ambulatory Visit: Payer: Self-pay | Admitting: Emergency Medicine

## 2013-06-05 ENCOUNTER — Other Ambulatory Visit: Payer: Self-pay | Admitting: Cardiology

## 2013-06-25 ENCOUNTER — Other Ambulatory Visit: Payer: Self-pay | Admitting: Cardiology

## 2013-06-27 ENCOUNTER — Ambulatory Visit (INDEPENDENT_AMBULATORY_CARE_PROVIDER_SITE_OTHER): Payer: BC Managed Care – PPO | Admitting: Pharmacist

## 2013-06-27 DIAGNOSIS — Z7901 Long term (current) use of anticoagulants: Secondary | ICD-10-CM

## 2013-06-27 DIAGNOSIS — I480 Paroxysmal atrial fibrillation: Secondary | ICD-10-CM

## 2013-06-27 DIAGNOSIS — I4891 Unspecified atrial fibrillation: Secondary | ICD-10-CM

## 2013-06-27 LAB — POCT INR: INR: 2.2

## 2013-06-30 ENCOUNTER — Encounter: Payer: Self-pay | Admitting: Emergency Medicine

## 2013-06-30 ENCOUNTER — Ambulatory Visit (INDEPENDENT_AMBULATORY_CARE_PROVIDER_SITE_OTHER): Payer: BC Managed Care – PPO | Admitting: Emergency Medicine

## 2013-06-30 VITALS — BP 120/70 | HR 58 | Ht 79.0 in | Wt 362.4 lb

## 2013-06-30 DIAGNOSIS — G4730 Sleep apnea, unspecified: Secondary | ICD-10-CM

## 2013-06-30 DIAGNOSIS — J449 Chronic obstructive pulmonary disease, unspecified: Secondary | ICD-10-CM

## 2013-06-30 MED ORDER — PREDNISONE 10 MG PO TABS: ORAL_TABLET | ORAL | Status: DC

## 2013-06-30 MED ORDER — FLUTICASONE PROPIONATE 50 MCG/ACT NA SUSP: 2.0000 | Freq: Two times a day (BID) | NASAL | Status: DC

## 2013-06-30 NOTE — Assessment & Plan Note (Signed)
Continue CPAP qhs 

## 2013-06-30 NOTE — Assessment & Plan Note (Signed)
He describes a more insidious worsening of his dyspnea, but he is wheezing on exam. ? Whether addition of narcotics for back pain is a factor here.  - continue nebs; have considered changing to long-acting meds but price is a concern - pred taper - walking oximetry - add mucinex for his congestion - add fluticasone  - rov 1

## 2013-06-30 NOTE — Patient Instructions (Addendum)
Walking oximetry today Take prednisone as directed (to completion) Start guaifenesin 600mg  twice a day Start fluticasone nasal spray, 2 sprays each nostril twice a day Continue your nebulized medications.   Follow with Dr Delton Coombes in 1 month or sooner if you have any problems.

## 2013-06-30 NOTE — Progress Notes (Signed)
Subjective:    Patient ID: [name], male    DOB: Mar 07, 1946, 67 y.o.   MRN: [id number]   HPI 67 yo man, hx tobacco, Hx RA, followed by Dr [place] for HTN, A Fib. OSA on CPAP.  He underwent CPAP titration since last time - recommended BiPAP 18/14 vs CPAP 18 (currently on 14). He wants to change from Spiriva to scheduled Nebs.   ROV 67/29/13 -- OSA on CPAP, COPD, Hx RA. Last time changed spiriva to duonebs tid. He returns c/o exertional sob and cough that started around a month ago. Last 2 -3 days worse, nasal gtt and congestion. He has used SABA more frequently. Has gained 10 lbs since last visit. He has gotten use to the CPAP at higher pressure (18). He stopped nexium a month ago! Replaced with prilosec + pepcid.   ROV 67/29/13 -- OSA on CPAP, COPD, Hx RA. He is on DuoNebs tid. Unable to get nexium, needed prior-auth which is pending. Has been wearing CPAP. He tells me that he is more dyspneic compared with last visit, significant decrease in his exercise tolerance. He had to stop Nexium again about 10 days ago. We changed to nebs a year ago due to cost, and he has never felt as well since that change. He has put another 10 lbs since April. He hears wheezing w exertion. He coughs frequently, productive but he hasn't seen it. No abx or pred since last time.   ROV 67/27/13 -- OSA on CPAP, COPD, Hx RA.  Last time we started Spiriva, changed duonebs to albuterol for prn use. Also restarted nexium. He returns reporting Since last time his dyspnea is worse - he can't recover from a walk as quickly. Started taking Spiriva sporadically about 2 weeks ago. Also having exertional CP, last myoview was April 2012 (no intervention needed).  Having wheezing, cough that is non-prod. Increased B LE edema over lat 3 mon. Has gained 8 lbs since last visit.   ROV 67/24/14 -- OSA reliable on CPAP 18, COPD, Hx RA. Also followed by Dr [place] for A Fib, rate control, volume status. Last time we stopped spiriva due to cost,  restarted duonebs qid > he says that he is only getting the albuterol component. He tells me he was recently rx with indomethacin for gout, caused epistaxis, had to have his coumadin adjusted.  He is having exertional SOB, difficulty taking a breath in.  Freq cough, ? productive  Christoper Allegra is DME > says he needs new nasal pillows, current one in poor repair. Also needs atrovent added to his nebs.   ROV 67/2/14 -- OSA reliable on CPAP 18, COPD, Hx RA, A Fib. Was seen by Dr Shelle Iron in 7/14 for an AE-COPD. Returns today improved after pred and doxy. He had a recent fall, ? Why. Didn't lose consciousness. Continues to do nebs on a schedule. He needs a new CPAP mask, but [employer] doesn't have his current PSG data.   ROV 67/18/14 -- OSA on CPAP 18, COPD, Hx RA, A Fib. He is having more trouble with overall breathing, not really sure this is a flare. He is able to exert less, dyspneic with just walking. Lots of cough and phlegm. Using duonebs qid. Saw [facility] at [place].      Objective:   Physical Exam Filed Vitals:   06/30/13 1125  BP: 120/70  Pulse: 58  Height: 6\' 7"  (2.007 m)  Weight: 362 lb 6.4 oz (164.384 kg)  SpO2: 94%   Gen:  Pleasant, obese, in no distress,  normal affect  ENT: No lesions,  mouth clear,  oropharynx clear, no postnasal drip  Neck: No JVD, no TMG, no carotid bruits  Lungs: No use of accessory muscles, B low pitched exp wheezes.   Cardiovascular: RRR, systolic M,  Musculoskeletal: No deformities, no cyanosis or clubbing  Neuro: alert, non focal  Skin: Warm, no lesions or rashes   Assessment & Plan:   COPD (chronic obstructive pulmonary disease) He describes a more insidious worsening of his dyspnea, but he is wheezing on exam. ? Whether addition of narcotics for back pain is a factor here.  - continue nebs; have considered changing to long-acting meds but price is a concern - pred taper - walking oximetry - add mucinex for his congestion - add fluticasone  -  rov 1  Sleep apnea Continue CPAP qhs

## 2013-07-13 ENCOUNTER — Other Ambulatory Visit: Payer: Self-pay | Admitting: Cardiology

## 2013-07-21 ENCOUNTER — Other Ambulatory Visit: Payer: Self-pay | Admitting: Cardiology

## 2013-07-26 ENCOUNTER — Encounter: Payer: Self-pay | Admitting: Neurology

## 2013-07-26 ENCOUNTER — Ambulatory Visit (INDEPENDENT_AMBULATORY_CARE_PROVIDER_SITE_OTHER): Payer: Medicare Other | Admitting: Neurology

## 2013-07-26 VITALS — BP 128/70 | HR 72 | Temp 98.3°F | Ht >= 80 in | Wt 368.0 lb

## 2013-07-26 DIAGNOSIS — M25559 Pain in unspecified hip: Secondary | ICD-10-CM

## 2013-07-26 DIAGNOSIS — M25552 Pain in left hip: Secondary | ICD-10-CM

## 2013-07-26 NOTE — Progress Notes (Addendum)
NEUROLOGY CONSULTATION NOTE  [name] MRN: [id number] DOB: June 29, 1946  Referring provider: Dr. Creta Levin Primary care provider: Dr. Creta Levin  Reason for consult:  Back pain, radiculopathy  HISTORY OF PRESENT ILLNESS: [name] is a 68 year old right-handed man with history of paroxysmal atrial fibrillation (on warfarin), COPD, rheumatoid arthritis, back pain, and sleep apnea who presents for back pain.  Records and images were personally reviewed where available.    He slipped and fell on both sides of his body about 2 months ago.  Since that time, he has developed a localized pain on his side just above the left hip.  It does not radiate down the leg.  Thee is no associated focal numbness involving the left leg, although he has chronic bilateral foot numbness from peripheral vascular disease.  It is aggravated when he bends forward, straightens up from a bent position, or gets up from a chair.  There is no focal weakness.  There is no bowel or bladder dysfunction.  He also injured his right arm as well.  He underwent 6 weeks of physical therapy, which helped his arm, but the hip pain persists.  He is currently on hydrocodone which he does not take often.    He has a history of back pain and radicular pain in the past.  12/02/07 MRI Lumbar spine:  mild degenerative changes without disc protrusion or spinal stenosis.  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  Of note, his sister had ALS and would like to checked out for this.  He denies symptoms such as focal weakness, muscle wasting or fasciculations.  He has some shortness of breath due to COPD.  He notes occasional difficulty with swallowing, but nothing significant.  PAST MEDICAL HISTORY: Past Medical History  Diagnosis Date  . PAF (paroxysmal atrial fibrillation)   . Chronic anticoagulation   . HTN (hypertension)   . Chronic chest pain   . Obesity   . Chronic back pain   . Emphysema   .  Chronic venous insufficiency   . Pneumothorax   . Rheumatoid arthritis(714.0)     PAST SURGICAL HISTORY: Past Surgical History  Procedure Laterality Date  . Cardiac catheterization  1998    NORMAL  . Hernia repair    . Vein ligation and stripping    . Pleural scarification      MEDICATIONS: Current Outpatient Prescriptions on File Prior to Visit  Medication Sig Dispense Refill  . albuterol (PROVENTIL) (2.5 MG/3ML) 0.083% nebulizer solution Take 3 mLs (2.5 mg total) by nebulization every 4 (four) hours as needed for shortness of breath.  75 mL  12  . amiodarone (PACERONE) 200 MG tablet Take 0.5 tablets (100 mg total) by mouth daily.  90 tablet  3  . atenolol (TENORMIN) 50 MG tablet TAKE 1 TABLET (50 MG TOTAL) BY MOUTH 2 (TWO) TIMES DAILY.  60 tablet  4  . diltiazem (CARDIZEM CD) 180 MG 24 hr capsule 1 capsule daily.      . fluticasone (FLONASE) 50 MCG/ACT nasal spray Place 2 sprays into both nostrils 2 (two) times daily.  16 g  11  . furosemide (LASIX) 40 MG tablet TAKE 1 TABLET (40 MG TOTAL) BY MOUTH DAILY.  90 tablet  0  . furosemide (LASIX) 40 MG tablet TAKE 1 TABLET (40 MG TOTAL) BY MOUTH DAILY.  90 tablet  0  . hydrochlorothiazide (HYDRODIURIL) 25 MG tablet TAKE 1 TABLET (25 MG TOTAL) BY  MOUTH DAILY.  60 tablet  5  . ipratropium-albuterol (DUONEB) 0.5-2.5 (3) MG/3ML SOLN Take 3 mLs by nebulization 4 (four) times daily.  360 mL  11  . LORazepam (ATIVAN) 0.5 MG tablet Take 0.5 mg by mouth as needed.      Marland Kitchen oxyCODONE-acetaminophen (PERCOCET/ROXICET) 5-325 MG per tablet 1 tablet every 3 (three) hours as needed.      . potassium chloride SA (K-DUR,KLOR-CON) 20 MEQ tablet Take 20 mEq by mouth 2 (two) times daily.      Marland Kitchen spironolactone (ALDACTONE) 25 MG tablet Take 1 tablet (25 mg total) by mouth daily.  30 tablet  9  . VENTOLIN HFA 108 (90 BASE) MCG/ACT inhaler INHALE 2 PUFFS INTO THE LUNGS EVERY 4 (FOUR) HOURS AS NEEDED FOR WHEEZING OR SHORTNESS OF BREATH.  18 g  10  . warfarin  (COUMADIN) 5 MG tablet TAKE DAILY OR AS DIRECTED  30 tablet  3   No current facility-administered medications on file prior to visit.    ALLERGIES: No Known Allergies  FAMILY HISTORY: Family History  Problem Relation Age of Onset  . Other Father     CEREBRAL THROMBOSIS  . Cancer Mother     bone cancer  . Heart failure Brother 50  . Coronary artery disease Brother 72    PTCA  . Rheum arthritis Mother   . Rheum arthritis Brother   . Rheum arthritis Brother     SOCIAL HISTORY: History   Social History  . Marital Status: Married    Spouse Name: N/A    Number of Children: 3  . Years of Education: N/A   Occupational History  . forklift operator [name]   Social History Main Topics  . Smoking status: Former Smoker -- 2.00 packs/day for 30 years    Types: Cigarettes    Quit date: 03/14/1996  . Smokeless tobacco: Never Used  . Alcohol Use: Yes     Comment: one beer per month  . Drug Use: No  . Sexual Activity: Not on file   Other Topics Concern  . Not on file   Social History Narrative  . No narrative on file    REVIEW OF SYSTEMS: Constitutional: No fevers, chills, or sweats, no generalized fatigue, change in appetite Eyes: No visual changes, double vision, eye pain Ear, nose and throat: No hearing loss, ear pain, nasal congestion, sore throat Cardiovascular: No chest pain, palpitations Respiratory:  Shortness of breath GastrointestinaI: No nausea, vomiting, diarrhea, abdominal pain, fecal incontinence Genitourinary:  No dysuria, urinary retention or frequency Musculoskeletal:  No neck pain, back pain Integumentary: swelling, discoloration of lower legs Neurological: as above Psychiatric: No depression, insomnia, anxiety Endocrine: No palpitations, fatigue, diaphoresis, mood swings, change in appetite, change in weight, increased thirst Hematologic/Lymphatic:  No anemia, purpura, petechiae. Allergic/Immunologic: no itchy/runny eyes, nasal congestion, recent  allergic reactions, rashes  PHYSICAL EXAM: Filed Vitals:   07/26/13 0947  BP: 128/70  Pulse: 72  Temp: 98.3 F (36.8 C)   General: No acute distress Head:  Normocephalic/atraumatic Neck: supple, no paraspinal tenderness, full range of motion Back: No paraspinal tenderness.  Notes tenderness to palpation of the left upper hip. Heart: regular rate and rhythm Lungs: Clear to auscultation bilaterally. Vascular: No carotid bruits. Extremities:  Significant edema and discoloration of feet and lower legs. Neurological Exam: Mental status: alert and oriented to person, place, and time, speech fluent and not dysarthric, language intact. Cranial nerves: CN I: not tested CN II: pupils equal, round and reactive to light,  visual fields intact, fundi unremarkable. CN III, IV, VI:  full range of motion, no nystagmus, no ptosis CN V: facial sensation intact CN VII: upper and lower face symmetric CN VIII: hearing intact CN IX, X: gag intact, uvula midline CN XI: sternocleidomastoid and trapezius muscles intact CN XII: tongue midline Bulk & Tone: normal, no fasciculations. Motor: 5/5 throughout. Sensation: reduced pinprick and vibration in feet and ankles. Deep Tendon Reflexes: 2+ throughout, toes down Finger to nose testing: mild postural and kinetic tremor.  No dysmetria Heel to shin: no dysmetria Gait: normal stance and stride.  Able to walk on toes and heels.  Able to walk in tandem. Romberg with initial mild sway which resolved. Straight leg raise negative.  FABER test negative.  IMPRESSION: 1.  Left hip pain, likely musculoskeletal.  I don't suspect a peripheral neuropathy or radiculopathy 2.  No clinical presentation on exam to suggest possible motor neuron disease.  PLAN: 1.  Conservative management as per PCP. 2.  ALS screen not indicated unless presents with suspicious symptoms.  Thank you for allowing me to take part in the care of this patient.  Shon Millet, DO  CC:    Warrick Parisian, MD

## 2013-07-26 NOTE — Patient Instructions (Signed)
I don't think the hip pain is neurologic.  I would just proceed with conservative therapy as per your primary doctor.  As for ALS, it is something we don't screen for unless there are specific symptoms of concern.  Your exam looks okay and doesn't make me suspect ALS.  Call with questions or concerns.

## 2013-08-02 ENCOUNTER — Encounter: Payer: Self-pay | Admitting: Emergency Medicine

## 2013-08-02 ENCOUNTER — Ambulatory Visit (INDEPENDENT_AMBULATORY_CARE_PROVIDER_SITE_OTHER): Payer: Medicare Other | Admitting: Emergency Medicine

## 2013-08-02 ENCOUNTER — Other Ambulatory Visit: Payer: Self-pay | Admitting: Cardiology

## 2013-08-02 VITALS — BP 128/68 | HR 66 | Ht >= 80 in | Wt 369.0 lb

## 2013-08-02 DIAGNOSIS — J449 Chronic obstructive pulmonary disease, unspecified: Secondary | ICD-10-CM

## 2013-08-02 DIAGNOSIS — J4489 Other specified chronic obstructive pulmonary disease: Secondary | ICD-10-CM

## 2013-08-02 MED ORDER — PREDNISONE 10 MG PO TABS: ORAL_TABLET | ORAL | Status: DC

## 2013-08-02 MED ORDER — DOXYCYCLINE HYCLATE 100 MG PO CAPS: 100.0000 mg | ORAL_CAPSULE | Freq: Two times a day (BID) | ORAL | Status: DC

## 2013-08-02 MED ORDER — BUDESONIDE 0.5 MG/2ML IN SUSP: 0.5000 mg | Freq: Two times a day (BID) | RESPIRATORY_TRACT | Status: DC

## 2013-08-02 NOTE — Progress Notes (Signed)
Subjective:    Patient ID: [name], male    DOB: 17-Sep-1945, 68 y.o.   MRN: [id number]   HPI 68 yo man, hx tobacco, Hx RA, followed by Dr Sw[place]and for HTN, A Fib. OSA on CPAP.  He underwent CPAP titration since last time - recommended BiPAP 18/14 vs CPAP 18 (currently on 14). He wants to change from Spiriva to scheduled Nebs.   ROV 11/10/11 -- OSA on CPAP, COPD, Hx RA. Last time changed spiriva to duonebs tid. He returns c/o exertional sob and cough that started around a month ago. Last 2 -3 days worse, nasal gtt and congestion. He has used SABA more frequently. Has gained 10 lbs since last visit. He has gotten use to the CPAP at higher pressure (18). He stopped nexium a month ago! Replaced with prilosec + pepcid.   ROV 03/11/12 -- OSA on CPAP, COPD, Hx RA. He is on DuoNebs tid. Unable to get nexium, needed prior-auth which is pending. Has been wearing CPAP. He tells me that he is more dyspneic compared with last visit, significant decrease in his exercise tolerance. He had to stop Nexium again about 10 days ago. We changed to nebs a year ago due to cost, and he has never felt as well since that change. He has put another 10 lbs since April. He hears wheezing w exertion. He coughs frequently, productive but he hasn't seen it. No abx or pred since last time.   ROV 07/09/12 -- OSA on CPAP, COPD, Hx RA.  Last time we started Spiriva, changed duonebs to albuterol for prn use. Also restarted nexium. He returns reporting Since last time his dyspnea is worse - he can't recover from a walk as quickly. Started taking Spiriva sporadically about 2 weeks ago. Also having exertional CP, last myoview was April 2012 (no intervention needed).  Having wheezing, cough that is non-prod. Increased B LE edema over lat 3 mon. Has gained 8 lbs since last visit.   ROV 11/04/12 -- OSA reliable on CPAP 18, COPD, Hx RA. Also followed by Dr Sw[place]iland for A Fib, rate control, volume status. Last time we stopped spiriva due to cost,  restarted duonebs qid > he says that he is only getting the albuterol component. He tells me he was recently rx with indomethacin for gout, caused epistaxis, had to have his coumadin adjusted.  He is having exertional SOB, difficulty taking a breath in.  Freq cough, ? productive  Christoper Allegra is DME > says he needs new nasal pillows, current one in poor repair. Also needs atrovent added to his nebs.   ROV 02/12/13 -- OSA reliable on CPAP 18, COPD, Hx RA, A Fib. Was seen by Dr Shelle Iron in 7/14 for an AE-COPD. Returns today improved after pred and doxy. He had a recent fall, ? Why. Didn't lose consciousness. Continues to do nebs on a schedule. He needs a new CPAP mask, but [employer] doesn't have his current PSG data.   ROV 06/30/13 -- OSA on CPAP 18, COPD, Hx RA, A Fib. He is having more trouble with overall breathing, not really sure this is a flare. He is able to exert less, dyspneic with just walking. Lots of cough and phlegm. Using duonebs qid. Saw [facility] at [place].   ROV 08/02/13 -- OSA on CPAP 18, COPD, Hx RA, A Fib. The prednisone temporarily helped him but he is now having DOE and SOB even when sitting still. Taking duonebs on a schedule. He took fluticasone nasal spray, has  refills.       Objective:   Physical Exam Filed Vitals:   08/02/13 1158  BP: 128/68  Pulse: 66  Height: 6\' 8"  (2.032 m)  Weight: 369 lb (167.377 kg)  SpO2: 92%   Gen: Pleasant, obese, in no distress,  normal affect  ENT: No lesions,  mouth clear,  oropharynx clear, no postnasal drip  Neck: No JVD, no TMG, no carotid bruits  Lungs: No use of accessory muscles, B low pitched exp wheezes, little change from last visit  Cardiovascular: RRR, systolic M,  Musculoskeletal: No deformities, no cyanosis or clubbing  Neuro: alert, non focal  Skin: Warm, no lesions or rashes   Assessment & Plan:   COPD (chronic obstructive pulmonary disease) Still w poor control of cough and dyspnea. He is wheezing on exam (as he was  last month). Temporary response to pred last month.  - add pulmicory nebs to his duonebs - repeat pred and doxycycline. i would like to see how he responds to the pulmicort when he is "at his best" after a pred taper - guaifenesin - fluticasone - rov 1

## 2013-08-02 NOTE — Patient Instructions (Signed)
Please continue your DuoNebs 4x a day Start Pulmicort nebs twice a day (12 hours apart) Take prednisone and doxycycline to completion Take guiafenesin (Mucinex) 600mg  twice a day Continue your fluticasone nasal spray Follow with Dr in 1 month

## 2013-08-02 NOTE — Assessment & Plan Note (Signed)
Still w poor control of cough and dyspnea. He is wheezing on exam (as he was last month). Temporary response to pred last month.  - add pulmicory nebs to his duonebs - repeat pred and doxycycline. i would like to see how he responds to the pulmicort when he is "at his best" after a pred taper - guaifenesin - fluticasone - rov 1

## 2013-08-08 ENCOUNTER — Ambulatory Visit (INDEPENDENT_AMBULATORY_CARE_PROVIDER_SITE_OTHER): Payer: Medicare Other | Admitting: *Deleted

## 2013-08-08 DIAGNOSIS — I480 Paroxysmal atrial fibrillation: Secondary | ICD-10-CM

## 2013-08-08 DIAGNOSIS — Z5181 Encounter for therapeutic drug level monitoring: Secondary | ICD-10-CM | POA: Insufficient documentation

## 2013-08-08 DIAGNOSIS — I4891 Unspecified atrial fibrillation: Secondary | ICD-10-CM

## 2013-08-08 DIAGNOSIS — Z7901 Long term (current) use of anticoagulants: Secondary | ICD-10-CM

## 2013-08-08 LAB — POCT INR: INR: 2.1

## 2013-08-22 ENCOUNTER — Ambulatory Visit (INDEPENDENT_AMBULATORY_CARE_PROVIDER_SITE_OTHER): Payer: Medicare Other | Admitting: Pharmacist

## 2013-08-22 DIAGNOSIS — I480 Paroxysmal atrial fibrillation: Secondary | ICD-10-CM

## 2013-08-22 DIAGNOSIS — I4891 Unspecified atrial fibrillation: Secondary | ICD-10-CM

## 2013-08-22 DIAGNOSIS — Z7901 Long term (current) use of anticoagulants: Secondary | ICD-10-CM

## 2013-08-22 DIAGNOSIS — Z5181 Encounter for therapeutic drug level monitoring: Secondary | ICD-10-CM

## 2013-08-22 LAB — POCT INR: INR: 2.9

## 2013-09-03 ENCOUNTER — Other Ambulatory Visit: Payer: Self-pay | Admitting: Cardiology

## 2013-09-04 ENCOUNTER — Other Ambulatory Visit: Payer: Self-pay | Admitting: Cardiology

## 2013-09-05 ENCOUNTER — Other Ambulatory Visit: Payer: Self-pay

## 2013-09-05 ENCOUNTER — Ambulatory Visit (INDEPENDENT_AMBULATORY_CARE_PROVIDER_SITE_OTHER): Payer: Medicare Other | Admitting: Emergency Medicine

## 2013-09-05 ENCOUNTER — Encounter: Payer: Self-pay | Admitting: Emergency Medicine

## 2013-09-05 VITALS — BP 124/72 | HR 77 | Ht >= 80 in | Wt 365.0 lb

## 2013-09-05 DIAGNOSIS — K219 Gastro-esophageal reflux disease without esophagitis: Secondary | ICD-10-CM

## 2013-09-05 DIAGNOSIS — G4730 Sleep apnea, unspecified: Secondary | ICD-10-CM

## 2013-09-05 DIAGNOSIS — J4489 Other specified chronic obstructive pulmonary disease: Secondary | ICD-10-CM

## 2013-09-05 DIAGNOSIS — J449 Chronic obstructive pulmonary disease, unspecified: Secondary | ICD-10-CM

## 2013-09-05 MED ORDER — OMEPRAZOLE 20 MG PO CPDR: 20.0000 mg | DELAYED_RELEASE_CAPSULE | Freq: Every day | ORAL | Status: DC

## 2013-09-05 MED ORDER — ATENOLOL 50 MG PO TABS: ORAL_TABLET | ORAL | Status: DC

## 2013-09-05 MED ORDER — DILTIAZEM HCL ER COATED BEADS 180 MG PO CP24: 180.0000 mg | ORAL_CAPSULE | Freq: Every day | ORAL | Status: DC

## 2013-09-05 NOTE — Assessment & Plan Note (Signed)
Continue CPAP qhs 

## 2013-09-05 NOTE — Telephone Encounter (Signed)
diltiazem (CARDIZEM CD) 180 MG 24 hr capsule 30 capsule 4 09/05/2013      Sig - Route:  Take 1 capsule (180 mg total) by mouth daily. - Oral    Class:  Normal    Authorizing Provider:  Peter M Swaziland, MD    Ordering User:  Greta Doom    atenolol (TENORMIN) 50 MG tablet 60 tablet 4 09/05/2013      Sig:  TAKE 1 TABLET (50 MG TOTAL) BY MOUTH 2 (TWO) TIMES DAILY.    Class:  Normal    Comment:  No refills available    Authorizing Provider:  Peter M Sw[place]and, MD    Ordering User:  Greta Doom

## 2013-09-05 NOTE — Assessment & Plan Note (Signed)
-   continue duonebs + budesonide - will restart PPI as a potential contributor to his freq exacerbations.  - rov 3

## 2013-09-05 NOTE — Progress Notes (Signed)
Subjective:    Patient ID: [name], male    DOB: 17-Sep-1945, 68 y.o.   MRN: [id number]   HPI 68 yo man, hx tobacco, Hx RA, followed by Dr Sw[place]and for HTN, A Fib. OSA on CPAP.  He underwent CPAP titration since last time - recommended BiPAP 18/14 vs CPAP 18 (currently on 14). He wants to change from Spiriva to scheduled Nebs.   ROV 11/10/11 -- OSA on CPAP, COPD, Hx RA. Last time changed spiriva to duonebs tid. He returns c/o exertional sob and cough that started around a month ago. Last 2 -3 days worse, nasal gtt and congestion. He has used SABA more frequently. Has gained 10 lbs since last visit. He has gotten use to the CPAP at higher pressure (18). He stopped nexium a month ago! Replaced with prilosec + pepcid.   ROV 03/11/12 -- OSA on CPAP, COPD, Hx RA. He is on DuoNebs tid. Unable to get nexium, needed prior-auth which is pending. Has been wearing CPAP. He tells me that he is more dyspneic compared with last visit, significant decrease in his exercise tolerance. He had to stop Nexium again about 10 days ago. We changed to nebs a year ago due to cost, and he has never felt as well since that change. He has put another 10 lbs since April. He hears wheezing w exertion. He coughs frequently, productive but he hasn't seen it. No abx or pred since last time.   ROV 07/09/12 -- OSA on CPAP, COPD, Hx RA.  Last time we started Spiriva, changed duonebs to albuterol for prn use. Also restarted nexium. He returns reporting Since last time his dyspnea is worse - he can't recover from a walk as quickly. Started taking Spiriva sporadically about 2 weeks ago. Also having exertional CP, last myoview was April 2012 (no intervention needed).  Having wheezing, cough that is non-prod. Increased B LE edema over lat 3 mon. Has gained 8 lbs since last visit.   ROV 11/04/12 -- OSA reliable on CPAP 18, COPD, Hx RA. Also followed by Dr [place] for A Fib, rate control, volume status. Last time we stopped spiriva due to cost,  restarted duonebs qid > he says that he is only getting the albuterol component. He tells me he was recently rx with indomethacin for gout, caused epistaxis, had to have his coumadin adjusted.  He is having exertional SOB, difficulty taking a breath in.  Freq cough, ? productive  Christoper Allegra is DME > says he needs new nasal pillows, current one in poor repair. Also needs atrovent added to his nebs.   ROV 02/12/13 -- OSA reliable on CPAP 18, COPD, Hx RA, A Fib. Was seen by Dr Shelle Iron in 7/14 for an AE-COPD. Returns today improved after pred and doxy. He had a recent fall, ? Why. Didn't lose consciousness. Continues to do nebs on a schedule. He needs a new CPAP mask, but [employer] doesn't have his current PSG data.   ROV 06/30/13 -- OSA on CPAP 18, COPD, Hx RA, A Fib. He is having more trouble with overall breathing, not really sure this is a flare. He is able to exert less, dyspneic with just walking. Lots of cough and phlegm. Using duonebs qid. Saw [facility] at [place].   ROV 08/02/13 -- OSA on CPAP 18, COPD, Hx RA, A Fib. The prednisone temporarily helped him but he is now having DOE and SOB even when sitting still. Taking duonebs on a schedule. He took fluticasone nasal spray, has  refills.    ROV 09/05/13 -- OSA on CPAP 18, COPD, Hx RA, A Fib. Last time I treated for an AE > he has had several apparent AE in last few months. Also added Pulmicort nebs to his DuoNebs. He returns today feeling better. He is using the budesonide bid. He has been off nexium x 1 year. He would be willing to start back on omeprazole.      Objective:   Physical Exam Filed Vitals:   09/05/13 1340  BP: 124/72  Pulse: 77  Height: 6\' 8"  (2.032 m)  Weight: 365 lb (165.563 kg)  SpO2: 95%   Gen: Pleasant, obese, in no distress,  normal affect  ENT: No lesions,  mouth clear,  oropharynx clear, no postnasal drip  Neck: No JVD, no TMG, no carotid bruits  Lungs: No use of accessory muscles, B wheezes on forced exp only, otherwise  clear  Cardiovascular: RRR, systolic M,  Musculoskeletal: No deformities, no cyanosis or clubbing  Neuro: alert, non focal  Skin: Warm, no lesions or rashes   Assessment & Plan:  COPD (chronic obstructive pulmonary disease) - continue duonebs + budesonide - will restart PPI as a potential contributor to his freq exacerbations.  - rov 3  Sleep apnea Continue CPAP qhs  GERD (gastroesophageal reflux disease) He used to be on Nexium, will start him now on omeprazole to see if he benefits    COPD (chronic obstructive pulmonary disease) - continue duonebs + budesonide - will restart PPI as a potential contributor to his freq exacerbations.  - rov 3  Sleep apnea Continue CPAP qhs  GERD (gastroesophageal reflux disease) He used to be on Nexium, will start him now on omeprazole to see if he benefits

## 2013-09-05 NOTE — Assessment & Plan Note (Signed)
He used to be on Nexium, will start him now on omeprazole to see if he benefits

## 2013-09-05 NOTE — Telephone Encounter (Signed)
atenolol (TENORMIN) 50 MG tablet  TAKE 1 TABLET (50 MG TOTAL) BY MOUTH 2 (TWO) TIMES DAILY.   60 tablet   diltiazem (CARDIZEM CD) 180 MG 24 hr capsule  TAKE 1 CAPSULE (180 MG TOTAL) BY MOUTH DAILY   Office Visit: 04/14/2013 Assessment / Plan:  1. Paroxysmal atrial fibrillation. His is well controlled on amiodarone. We will continue amiodarone 100 mg daily and atenolol. I'll followup again in 6 months Discontinued Medications  Reason for Discontinue: diltiazem (CARDIZEM CD) 180 MG 24 hr capsule Discontinued by provider

## 2013-09-05 NOTE — Patient Instructions (Signed)
Please continue your Duonebs and Pulmicort as you are taking them Start omeprazole 20mg  daily.  Follow with Dr in 3 months or sooner if you have any problems.

## 2013-09-07 ENCOUNTER — Other Ambulatory Visit: Payer: Self-pay | Admitting: Cardiology

## 2013-09-19 ENCOUNTER — Emergency Department (HOSPITAL_COMMUNITY): Payer: Medicare Other

## 2013-09-19 ENCOUNTER — Encounter (HOSPITAL_COMMUNITY): Payer: Self-pay | Admitting: Emergency Medicine

## 2013-09-19 ENCOUNTER — Inpatient Hospital Stay (HOSPITAL_COMMUNITY)
Admission: EM | Admit: 2013-09-19 | Discharge: 2013-09-22 | DRG: 190 | Disposition: A | Discharge status: Home Health | Payer: Medicare Other | Attending: Internal Medicine | Admitting: Internal Medicine

## 2013-09-19 DIAGNOSIS — R0603 Acute respiratory distress: Secondary | ICD-10-CM

## 2013-09-19 DIAGNOSIS — J440 Chronic obstructive pulmonary disease with acute lower respiratory infection: Principal | ICD-10-CM | POA: Diagnosis present

## 2013-09-19 DIAGNOSIS — G4730 Sleep apnea, unspecified: Secondary | ICD-10-CM

## 2013-09-19 DIAGNOSIS — R7309 Other abnormal glucose: Secondary | ICD-10-CM | POA: Diagnosis present

## 2013-09-19 DIAGNOSIS — Z9981 Dependence on supplemental oxygen: Secondary | ICD-10-CM

## 2013-09-19 DIAGNOSIS — J111 Influenza due to unidentified influenza virus with other respiratory manifestations: Secondary | ICD-10-CM | POA: Diagnosis present

## 2013-09-19 DIAGNOSIS — I4891 Unspecified atrial fibrillation: Secondary | ICD-10-CM | POA: Diagnosis present

## 2013-09-19 DIAGNOSIS — J9600 Acute respiratory failure, unspecified whether with hypoxia or hypercapnia: Secondary | ICD-10-CM | POA: Diagnosis present

## 2013-09-19 DIAGNOSIS — J209 Acute bronchitis, unspecified: Principal | ICD-10-CM | POA: Diagnosis present

## 2013-09-19 DIAGNOSIS — Z87891 Personal history of nicotine dependence: Secondary | ICD-10-CM

## 2013-09-19 DIAGNOSIS — I10 Essential (primary) hypertension: Secondary | ICD-10-CM | POA: Diagnosis present

## 2013-09-19 DIAGNOSIS — R079 Chest pain, unspecified: Secondary | ICD-10-CM

## 2013-09-19 DIAGNOSIS — M549 Dorsalgia, unspecified: Secondary | ICD-10-CM | POA: Diagnosis present

## 2013-09-19 DIAGNOSIS — Z7901 Long term (current) use of anticoagulants: Secondary | ICD-10-CM

## 2013-09-19 DIAGNOSIS — G8929 Other chronic pain: Secondary | ICD-10-CM

## 2013-09-19 DIAGNOSIS — R0609 Other forms of dyspnea: Secondary | ICD-10-CM

## 2013-09-19 DIAGNOSIS — R0600 Dyspnea, unspecified: Secondary | ICD-10-CM

## 2013-09-19 DIAGNOSIS — T380X5A Adverse effect of glucocorticoids and synthetic analogues, initial encounter: Secondary | ICD-10-CM | POA: Diagnosis present

## 2013-09-19 DIAGNOSIS — R0989 Other specified symptoms and signs involving the circulatory and respiratory systems: Secondary | ICD-10-CM

## 2013-09-19 DIAGNOSIS — I872 Venous insufficiency (chronic) (peripheral): Secondary | ICD-10-CM | POA: Diagnosis present

## 2013-09-19 DIAGNOSIS — K219 Gastro-esophageal reflux disease without esophagitis: Secondary | ICD-10-CM

## 2013-09-19 DIAGNOSIS — I480 Paroxysmal atrial fibrillation: Secondary | ICD-10-CM

## 2013-09-19 DIAGNOSIS — Z79899 Other long term (current) drug therapy: Secondary | ICD-10-CM

## 2013-09-19 DIAGNOSIS — J449 Chronic obstructive pulmonary disease, unspecified: Secondary | ICD-10-CM | POA: Diagnosis present

## 2013-09-19 DIAGNOSIS — Z5181 Encounter for therapeutic drug level monitoring: Secondary | ICD-10-CM

## 2013-09-19 DIAGNOSIS — D72829 Elevated white blood cell count, unspecified: Secondary | ICD-10-CM | POA: Diagnosis present

## 2013-09-19 DIAGNOSIS — M069 Rheumatoid arthritis, unspecified: Secondary | ICD-10-CM | POA: Diagnosis present

## 2013-09-19 DIAGNOSIS — E669 Obesity, unspecified: Secondary | ICD-10-CM | POA: Diagnosis present

## 2013-09-19 DIAGNOSIS — M79609 Pain in unspecified limb: Secondary | ICD-10-CM

## 2013-09-19 DIAGNOSIS — I83893 Varicose veins of bilateral lower extremities with other complications: Secondary | ICD-10-CM

## 2013-09-19 LAB — BASIC METABOLIC PANEL
BUN: 20 mg/dL (ref 6–23)
CHLORIDE: 93 meq/L — AB (ref 96–112)
CO2: 25 mEq/L (ref 19–32)
Calcium: 9.3 mg/dL (ref 8.4–10.5)
Creatinine, Ser: 1.31 mg/dL (ref 0.50–1.35)
GFR calc Af Amer: 63 mL/min — ABNORMAL LOW (ref >90)
GFR calc non Af Amer: 55 mL/min — ABNORMAL LOW (ref >90)
Glucose, Bld: 143 mg/dL — ABNORMAL HIGH (ref 70–99)
Potassium: 4 mEq/L (ref 3.7–5.3)
SODIUM: 134 meq/L — AB (ref 137–147)

## 2013-09-19 LAB — CBC WITH DIFFERENTIAL/PLATELET
Basophils Absolute: 0.1 10*3/uL (ref 0.0–0.1)
Basophils Relative: 1 % (ref 0–1)
Eosinophils Absolute: 0.2 10*3/uL (ref 0.0–0.7)
Eosinophils Relative: 1 % (ref 0–5)
HCT: 41.3 % (ref 39.0–52.0)
Hemoglobin: 13.8 g/dL (ref 13.0–17.0)
Lymphocytes Relative: 8 % — ABNORMAL LOW (ref 12–46)
Lymphs Abs: 1.6 10*3/uL (ref 0.7–4.0)
MCH: 28.6 pg (ref 26.0–34.0)
MCHC: 33.4 g/dL (ref 30.0–36.0)
MCV: 85.5 fL (ref 78.0–100.0)
Monocytes Absolute: 2.3 10*3/uL — ABNORMAL HIGH (ref 0.1–1.0)
Monocytes Relative: 11 % (ref 3–12)
Neutro Abs: 17.5 10*3/uL — ABNORMAL HIGH (ref 1.7–7.7)
Neutrophils Relative %: 81 % — ABNORMAL HIGH (ref 43–77)
Platelets: 201 10*3/uL (ref 150–400)
RBC: 4.83 MIL/uL (ref 4.22–5.81)
RDW: 14.8 % (ref 11.5–15.5)
WBC: 21.8 10*3/uL — ABNORMAL HIGH (ref 4.0–10.5)

## 2013-09-19 LAB — PRO B NATRIURETIC PEPTIDE: PRO B NATRI PEPTIDE: 791.2 pg/mL — AB (ref 0–125)

## 2013-09-19 LAB — PROTIME-INR
INR: 1.51 — AB (ref 0.00–1.49)
PROTHROMBIN TIME: 17.8 s — AB (ref 11.6–15.2)

## 2013-09-19 LAB — I-STAT CG4 LACTIC ACID, ED: Lactic Acid, Venous: 1.08 mmol/L (ref 0.5–2.2)

## 2013-09-19 LAB — STREP PNEUMONIAE URINARY ANTIGEN: Strep Pneumo Urinary Antigen: NEGATIVE

## 2013-09-19 MED ORDER — SODIUM CHLORIDE 0.9 % IJ SOLN
3.0000 mL | Freq: Two times a day (BID) | INTRAMUSCULAR | Status: DC
  Administered 2013-09-19 – 2013-09-20 (×3): 3 mL via INTRAVENOUS

## 2013-09-19 MED ORDER — ALBUTEROL (5 MG/ML) CONTINUOUS INHALATION SOLN
INHALATION_SOLUTION | RESPIRATORY_TRACT | Status: AC
  Filled 2013-09-19: qty 20

## 2013-09-19 MED ORDER — METHYLPREDNISOLONE SODIUM SUCC 125 MG IJ SOLR
125.0000 mg | INTRAMUSCULAR | Status: AC
  Administered 2013-09-19: 125 mg via INTRAVENOUS
  Filled 2013-09-19: qty 2

## 2013-09-19 MED ORDER — HYDROCODONE-ACETAMINOPHEN 5-325 MG PO TABS
1.0000 | ORAL_TABLET | ORAL | Status: DC | PRN
  Administered 2013-09-20: 1 via ORAL
  Filled 2013-09-19: qty 1

## 2013-09-19 MED ORDER — ALBUTEROL SULFATE (2.5 MG/3ML) 0.083% IN NEBU
2.5000 mg | INHALATION_SOLUTION | RESPIRATORY_TRACT | Status: DC | PRN
  Administered 2013-09-20 – 2013-09-21 (×2): 2.5 mg via RESPIRATORY_TRACT
  Filled 2013-09-19 (×2): qty 3

## 2013-09-19 MED ORDER — FUROSEMIDE 40 MG PO TABS
40.0000 mg | ORAL_TABLET | Freq: Every day | ORAL | Status: DC
  Administered 2013-09-20 – 2013-09-22 (×3): 40 mg via ORAL
  Filled 2013-09-19 (×3): qty 1

## 2013-09-19 MED ORDER — DEXTROSE 5 % IV SOLN: 500.0000 mg | INTRAVENOUS | Status: DC

## 2013-09-19 MED ORDER — ZOLPIDEM TARTRATE 5 MG PO TABS
5.0000 mg | ORAL_TABLET | Freq: Once | ORAL | Status: AC
  Administered 2013-09-19: 5 mg via ORAL
  Filled 2013-09-19: qty 1

## 2013-09-19 MED ORDER — IPRATROPIUM BROMIDE 0.02 % IN SOLN
RESPIRATORY_TRACT | Status: AC
  Filled 2013-09-19: qty 2.5

## 2013-09-19 MED ORDER — ALBUTEROL SULFATE (2.5 MG/3ML) 0.083% IN NEBU
2.5000 mg | INHALATION_SOLUTION | Freq: Four times a day (QID) | RESPIRATORY_TRACT | Status: DC
  Administered 2013-09-19 (×3): 2.5 mg via RESPIRATORY_TRACT
  Filled 2013-09-19 (×3): qty 3

## 2013-09-19 MED ORDER — ALBUTEROL SULFATE (2.5 MG/3ML) 0.083% IN NEBU: 2.5000 mg | INHALATION_SOLUTION | RESPIRATORY_TRACT | Status: DC

## 2013-09-19 MED ORDER — ALBUTEROL SULFATE (2.5 MG/3ML) 0.083% IN NEBU
2.5000 mg | INHALATION_SOLUTION | Freq: Once | RESPIRATORY_TRACT | Status: AC
  Administered 2013-09-19: 2.5 mg via RESPIRATORY_TRACT

## 2013-09-19 MED ORDER — ONDANSETRON HCL 4 MG/2ML IJ SOLN: 4.0000 mg | Freq: Four times a day (QID) | INTRAMUSCULAR | Status: DC | PRN

## 2013-09-19 MED ORDER — METHYLPREDNISOLONE SODIUM SUCC 125 MG IJ SOLR
60.0000 mg | Freq: Four times a day (QID) | INTRAMUSCULAR | Status: DC
  Administered 2013-09-19 – 2013-09-21 (×9): 60 mg via INTRAVENOUS
  Filled 2013-09-19 (×2): qty 0.96
  Filled 2013-09-19: qty 2
  Filled 2013-09-19 (×3): qty 0.96
  Filled 2013-09-19: qty 2
  Filled 2013-09-19 (×5): qty 0.96

## 2013-09-19 MED ORDER — HYDROCHLOROTHIAZIDE 25 MG PO TABS
25.0000 mg | ORAL_TABLET | Freq: Every day | ORAL | Status: DC
  Administered 2013-09-20 – 2013-09-22 (×3): 25 mg via ORAL
  Filled 2013-09-19 (×3): qty 1

## 2013-09-19 MED ORDER — SODIUM CHLORIDE 0.9 % IJ SOLN
3.0000 mL | Freq: Two times a day (BID) | INTRAMUSCULAR | Status: DC
  Administered 2013-09-20: 3 mL via INTRAVENOUS

## 2013-09-19 MED ORDER — IPRATROPIUM-ALBUTEROL 0.5-2.5 (3) MG/3ML IN SOLN
3.0000 mL | Freq: Four times a day (QID) | RESPIRATORY_TRACT | Status: DC
  Administered 2013-09-20 – 2013-09-22 (×10): 3 mL via RESPIRATORY_TRACT
  Filled 2013-09-19 (×10): qty 3

## 2013-09-19 MED ORDER — GUAIFENESIN ER 600 MG PO TB12
600.0000 mg | ORAL_TABLET | Freq: Four times a day (QID) | ORAL | Status: DC
  Administered 2013-09-19 – 2013-09-22 (×13): 600 mg via ORAL
  Filled 2013-09-19 (×15): qty 1

## 2013-09-19 MED ORDER — PANTOPRAZOLE SODIUM 40 MG PO TBEC
40.0000 mg | DELAYED_RELEASE_TABLET | Freq: Every day | ORAL | Status: DC
  Administered 2013-09-19 – 2013-09-22 (×4): 40 mg via ORAL
  Filled 2013-09-19 (×4): qty 1

## 2013-09-19 MED ORDER — SPIRONOLACTONE 25 MG PO TABS
25.0000 mg | ORAL_TABLET | Freq: Every day | ORAL | Status: DC
Start: 2013-09-20 — End: 2013-09-22
  Administered 2013-09-20 – 2013-09-22 (×3): 25 mg via ORAL
  Filled 2013-09-19 (×3): qty 1

## 2013-09-19 MED ORDER — DEXTROSE 5 % IV SOLN
500.0000 mg | INTRAVENOUS | Status: DC
  Administered 2013-09-20 – 2013-09-22 (×3): 500 mg via INTRAVENOUS
  Filled 2013-09-19 (×3): qty 500

## 2013-09-19 MED ORDER — POTASSIUM CHLORIDE CRYS ER 20 MEQ PO TBCR
20.0000 meq | EXTENDED_RELEASE_TABLET | Freq: Two times a day (BID) | ORAL | Status: DC
  Administered 2013-09-19 – 2013-09-22 (×7): 20 meq via ORAL
  Filled 2013-09-19 (×8): qty 1

## 2013-09-19 MED ORDER — AZITHROMYCIN 500 MG IV SOLR
500.0000 mg | Freq: Once | INTRAVENOUS | Status: AC
  Administered 2013-09-19: 500 mg via INTRAVENOUS

## 2013-09-19 MED ORDER — IPRATROPIUM BROMIDE 0.02 % IN SOLN
0.5000 mg | Freq: Once | RESPIRATORY_TRACT | Status: AC
  Administered 2013-09-19: 0.5 mg via RESPIRATORY_TRACT

## 2013-09-19 MED ORDER — WARFARIN - PHARMACIST DOSING INPATIENT
Freq: Every day | Status: DC
Start: 2013-09-19 — End: 2013-09-22

## 2013-09-19 MED ORDER — ALBUTEROL SULFATE (2.5 MG/3ML) 0.083% IN NEBU
5.0000 mg | INHALATION_SOLUTION | Freq: Once | RESPIRATORY_TRACT | Status: AC
  Administered 2013-09-19: 5 mg via RESPIRATORY_TRACT
  Filled 2013-09-19: qty 6

## 2013-09-19 MED ORDER — ATENOLOL 50 MG PO TABS
50.0000 mg | ORAL_TABLET | Freq: Two times a day (BID) | ORAL | Status: DC
  Administered 2013-09-19 – 2013-09-22 (×6): 50 mg via ORAL
  Filled 2013-09-19 (×8): qty 1

## 2013-09-19 MED ORDER — ALBUTEROL SULFATE (2.5 MG/3ML) 0.083% IN NEBU: 2.5000 mg | INHALATION_SOLUTION | RESPIRATORY_TRACT | Status: DC | PRN

## 2013-09-19 MED ORDER — WARFARIN SODIUM 5 MG PO TABS
5.0000 mg | ORAL_TABLET | Freq: Once | ORAL | Status: AC
  Administered 2013-09-19: 5 mg via ORAL
  Filled 2013-09-19 (×2): qty 1

## 2013-09-19 MED ORDER — ALBUTEROL SULFATE (2.5 MG/3ML) 0.083% IN NEBU: 2.5000 mg | INHALATION_SOLUTION | Freq: Four times a day (QID) | RESPIRATORY_TRACT | Status: DC

## 2013-09-19 MED ORDER — SODIUM CHLORIDE 0.9 % IJ SOLN: 3.0000 mL | INTRAMUSCULAR | Status: DC | PRN

## 2013-09-19 MED ORDER — SODIUM CHLORIDE 0.9 % IV SOLN: 250.0000 mL | INTRAVENOUS | Status: DC | PRN

## 2013-09-19 MED ORDER — ONDANSETRON HCL 4 MG PO TABS: 4.0000 mg | ORAL_TABLET | Freq: Four times a day (QID) | ORAL | Status: DC | PRN

## 2013-09-19 MED ORDER — AMIODARONE HCL 100 MG PO TABS
100.0000 mg | ORAL_TABLET | Freq: Every day | ORAL | Status: DC
  Administered 2013-09-19 – 2013-09-22 (×4): 100 mg via ORAL
  Filled 2013-09-19 (×4): qty 1

## 2013-09-19 MED ORDER — GUAIFENESIN-DM 100-10 MG/5ML PO SYRP: 5.0000 mL | ORAL_SOLUTION | ORAL | Status: DC | PRN

## 2013-09-19 MED ORDER — DILTIAZEM HCL ER COATED BEADS 180 MG PO CP24
180.0000 mg | ORAL_CAPSULE | Freq: Every day | ORAL | Status: DC
  Administered 2013-09-19 – 2013-09-22 (×4): 180 mg via ORAL
  Filled 2013-09-19 (×4): qty 1

## 2013-09-19 NOTE — Progress Notes (Signed)
ANTICOAGULATION CONSULT NOTE - Initial Consult  Pharmacy Consult for Wafarin Indication: History Afib  No Known Allergies  Patient Measurements:   Heparin Dosing Weight:   Vital Signs: Temp: 98.8 F (37.1 C) (03/09 0640) Temp src: Oral (03/09 0640) BP: 112/39 mmHg (03/09 1530) Pulse Rate: 59 (03/09 1530)  Labs:  Recent Labs  09/19/13 0700 09/19/13 0857  HGB 13.8  --   HCT 41.3  --   PLT 201  --   LABPROT  --  17.8*  INR  --  1.51*  CREATININE 1.31  --     The CrCl is unknown because both a height and weight (above a minimum accepted value) are required for this calculation.   Medical History: Past Medical History  Diagnosis Date  . PAF (paroxysmal atrial fibrillation)   . Chronic anticoagulation   . HTN (hypertension)   . Chronic chest pain   . Obesity   . Chronic back pain   . Emphysema   . Chronic venous insufficiency   . Pneumothorax   . Rheumatoid arthritis(714.0)     Assessment: 38 yoM with PMHx COPD, Afib on chronic warfarin, obesity, presents with persistent tachypnea and wheezing despite multiple breathing treatments.  CXR demonstrates bronchitis and pt to be admitted for antibiotics.  Pharmacy consulted to resume warfarin dosing inpatient.    Home dose warfarin: 5mg  daily except 2.5mg  on M/W/F.   INR = 1.51, subtherapeutic.   CBC: No issues noted  Noted also on chronic amiodarone.  Started on azithromycin inpt which could cause elevations in INR.   Regular diet started.   Goal of Therapy:  INR 2-3 Monitor platelets by anticoagulation protocol: Yes   Plan:  Warfarin 5mg  po x 1 tonight Daily PT/INR  , PharmD, BCPS 09/19/2013, 6:12 PM  Pager: [phone]

## 2013-09-19 NOTE — Progress Notes (Signed)
RT continued Adult Wheeze Protocol -pre peak 250, gave additional Albuterol 5.0mg , post peak 300. PT Sp02 on 2 lpm Rudyard 97%, RR 16, BS end exp whz, PT is not in respiratory distress at this time. MD re-evaluated PT and agrees much better. PT will be admitted.

## 2013-09-19 NOTE — ED Provider Notes (Signed)
CSN: [id number]     Arrival date & time 09/19/13  8416 History   First MD Initiated Contact with Patient 09/19/13 [phone]     Chief Complaint  Patient presents with  . Shortness of Breath      HPI  Patient presents with dyspnea. Symptoms began 4 days ago.  Since onset symptoms have progressed, despite using multiple albuterol treatment, CPAP. Patient denies chest pain, abdominal pain, though he does complain of nausea, with no emesis. There are associated subjective fever, no objective temperature increase. No confusion, disorientation, falls. Patient has been compliant with all medication History of present illness is per the patient and his wife, given the patient's receiving albuterol therapy on arrival to the emergency department.    Past Medical History  Diagnosis Date  . PAF (paroxysmal atrial fibrillation)   . Chronic anticoagulation   . HTN (hypertension)   . Chronic chest pain   . Obesity   . Chronic back pain   . Emphysema   . Chronic venous insufficiency   . Pneumothorax   . Rheumatoid arthritis(714.0)    Past Surgical History  Procedure Laterality Date  . Cardiac catheterization  1998    NORMAL  . Hernia repair    . Vein ligation and stripping    . Pleural scarification     Family History  Problem Relation Age of Onset  . Other Father     CEREBRAL THROMBOSIS  . Cancer Mother     bone cancer  . Heart failure Brother 50  . Coronary artery disease Brother 48    PTCA  . Rheum arthritis Mother   . Rheum arthritis Brother   . Rheum arthritis Brother    History  Substance Use Topics  . Smoking status: Former Smoker -- 2.00 packs/day for 30 years    Types: Cigarettes    Quit date: 03/14/1996  . Smokeless tobacco: Never Used  . Alcohol Use: Yes     Comment: one beer per month    Review of Systems  Constitutional:       Per HPI, otherwise negative  HENT:       Per HPI, otherwise negative  Respiratory:       Per HPI, otherwise negative   Cardiovascular:       Per HPI, otherwise negative  Gastrointestinal: Negative for vomiting.  Endocrine:       Negative aside from HPI  Genitourinary:       Neg aside from HPI   Musculoskeletal:       Per HPI, otherwise negative  Skin: Negative.   Neurological: Negative for syncope.      Allergies  Review of patient's allergies indicates no known allergies.  Home Medications   Current Outpatient Rx  Name  Route  Sig  Dispense  Refill  . albuterol (PROVENTIL HFA;VENTOLIN HFA) 108 (90 BASE) MCG/ACT inhaler   Inhalation   Inhale 2 puffs into the lungs every 6 (six) hours as needed for wheezing or shortness of breath.         Marland Kitchen albuterol (PROVENTIL) (2.5 MG/3ML) 0.083% nebulizer solution   Nebulization   Take 3 mLs (2.5 mg total) by nebulization every 4 (four) hours as needed for shortness of breath.   75 mL   12   . amiodarone (PACERONE) 200 MG tablet   Oral   Take 0.5 tablets (100 mg total) by mouth daily.   15 tablet   1     Rx has expired - unused refills remain   .  atenolol (TENORMIN) 50 MG tablet   Oral   Take 50 mg by mouth 2 (two) times daily.         Marland Kitchen diltiazem (CARDIZEM CD) 180 MG 24 hr capsule   Oral   Take 1 capsule (180 mg total) by mouth daily.   30 capsule   4   . furosemide (LASIX) 40 MG tablet   Oral   Take 40 mg by mouth daily.         Marland Kitchen guaiFENesin (MUCINEX) 600 MG 12 hr tablet   Oral   Take 600 mg by mouth 4 (four) times daily.         . hydrochlorothiazide (HYDRODIURIL) 25 MG tablet   Oral   Take 25 mg by mouth daily.         Marland Kitchen ipratropium-albuterol (DUONEB) 0.5-2.5 (3) MG/3ML SOLN   Nebulization   Take 3 mLs by nebulization 4 (four) times daily.   360 mL   11   . omeprazole (PRILOSEC) 20 MG capsule   Oral   Take 1 capsule (20 mg total) by mouth daily.   30 capsule   11   . potassium chloride SA (K-DUR,KLOR-CON) 20 MEQ tablet   Oral   Take 20 mEq by mouth 2 (two) times daily.         Marland Kitchen spironolactone  (ALDACTONE) 25 MG tablet   Oral   Take 1 tablet (25 mg total) by mouth daily.   30 tablet   9   . warfarin (COUMADIN) 5 MG tablet   Oral   Take 5 mg by mouth daily. Takes  2.5mg  on Monday, Wednesday, and Friday. Takes 5mg  on Tuesday, Thursday, Saturday, and Sunday.          BP 146/55  Pulse 72  Temp(Src) 98.8 F (37.1 C) (Oral)  Resp 36  SpO2 99% Physical Exam  Nursing note and vitals reviewed. Constitutional: He is oriented to person, place, and time. He appears distressed.  Obese, diaphoretic male respiratory distress  HENT:  Head: Normocephalic and atraumatic.  Eyes: Conjunctivae and EOM are normal.  Cardiovascular: Normal rate, regular rhythm and intact distal pulses.   Pulmonary/Chest: Accessory muscle usage present. No stridor. Tachypnea noted. He is in respiratory distress. He has decreased breath sounds. He has wheezes. He has rhonchi.  Abdominal: He exhibits no distension.  Musculoskeletal: He exhibits no edema.  Neurological: He is alert and oriented to person, place, and time.  Skin: Skin is warm. He is diaphoretic.  Psychiatric: He has a normal mood and affect.    ED Course  Procedures (including critical care time)  After the initial evaluation the patient received empiric albuterol therapy, subliminal oxygen. Respiratory therapy assisted with the initiation of the adult wheezing protocol.  Initial peak flow was 250, less than 50% of expected for this patient.  Update: Patient continues to have tachypnea, respiratory difficulty.  Labs Review Labs Reviewed  CBC WITH DIFFERENTIAL - Abnormal; Notable for the following:    WBC 21.8 (*)    Neutrophils Relative % 81 (*)    Neutro Abs 17.5 (*)    Lymphocytes Relative 8 (*)    Monocytes Absolute 2.3 (*)    All other components within normal limits  BASIC METABOLIC PANEL - Abnormal; Notable for the following:    Sodium 134 (*)    Chloride 93 (*)    Glucose, Bld 143 (*)    GFR calc non Af Amer 55 (*)     GFR calc Af Denyse Dago  63 (*)    All other components within normal limits  CULTURE, BLOOD (ROUTINE X 2)  CULTURE, BLOOD (ROUTINE X 2)  I-STAT CG4 LACTIC ACID, ED   Imaging Review Dg Chest Port 1 View  09/19/2013   CLINICAL DATA:  Cough, shortness of breath and fever.  EXAM: PORTABLE CHEST - 1 VIEW  COMPARISON:  Chest x-ray 04/15/2013.  FINDINGS: Mild peribronchial cuffing diffusely. Lung volumes are normal. No consolidative airspace disease. No pleural effusions. No pneumothorax. No pulmonary nodule or mass noted. Pulmonary vasculature and the cardiomediastinal silhouette are within normal limits.  IMPRESSION: 1. Mild diffuse peribronchial cuffing may suggest a bronchitis.   Electronically Signed   By: Trudie Reed M.D.   On: 09/19/2013 07:23     EKG Interpretation   Date/Time:  Monday September 19 2013 06:57:23 EDT Ventricular Rate:  67 PR Interval:  249 QRS Duration: 85 QT Interval:  425 QTC Calculation: 449 R Axis:   -47 Text Interpretation:  Sinus rhythm Prolonged PR interval LAD, consider  left anterior fascicular block Sinus rhythm Artifact Left axis deviation  No significant change since last tracing Confirmed by Gerhard Munch  MD  ([id number]) on 09/19/2013 8:09:26 AM      8:24 AM Three breathing treatments thus far. Expiratory wheezes still present.  Supplemental O2 to 3L [place]. Patient has already received three breathing treatments. I reviewed the XR / labs with the patient and his wife.  Patient's x-ray demonstrates bronchitis without overt consolidation or fluid overload status. With the patient's COPD history he received steroids empirically, now will receive antibiotics as well.  Given the persistent tachypnea, wheezing, patient required admission for further evaluation and management.   MDM   This patient presents with days of worsening respiratory failure.  On exam patient is tachypneic, hypoxic, diaphoretic, and in distress. Patient presents in the emergency department  with multiple nebulizer treatments, steroids, antibiotics, fluids. However, given the patient's comorbidities, including atrial fibrillation, COPD, obesity, he required admission due to persistent new oxygen dependency.   CRITICAL CARE Performed by: Gerhard Munch Total critical care time: 35 Critical care time was exclusive of separately billable procedures and treating other patients. Critical care was necessary to treat or prevent imminent or life-threatening deterioration. Critical care was time spent personally by me on the following activities: development of treatment plan with patient and/or surrogate as well as nursing, discussions with consultants, evaluation of patient's response to treatment, examination of patient, obtaining history from patient or surrogate, ordering and performing treatments and interventions, ordering and review of laboratory studies, ordering and review of radiographic studies, pulse oximetry and re-evaluation of patient's condition.   Gerhard Munch, MD 09/19/13 1348

## 2013-09-19 NOTE — H&P (Addendum)
[facility] History and Physical  [name] BSW:[id number] DOB: 20-Aug-1945 DOA: 09/19/2013  Referring physician: ED physician PCP: Fraser Din, PA-C   Chief Complaint: shortness of breath and malaise  HPI:  Patient is 68 yo male who presented to [facility] with main concern of several days duration of progressively worsening shortness of breath, initially present with exertion and now present at rest, associated with fevers, chills, nausea, poor oral intake, malaise. Pt denies chest pain other than when coughing and is also having productive cough of yellow sputum. He denies any specific alleviating factors, no known sick contacts or exposures. No other abdominal or urinary concerns.   In ED, pt noted to be wheezing with oxygen saturation in high 80's on RA and requiring oxygen, albuterol nebulizer. He was also noted to be in atrial fibrillation with HR in 110's. [facility] asked to admit for presumptive COPD management. Telemetry bed requested.   Assessment and Plan: Active Problems:   Respiratory distress - appears to be related to acute COPD exacerbation and bronchitis - will admit to telemetry bed due to atrial fibrillation - will provide supportive care with BD's scheduled and as needed, solumedrol, empiric ABX - sputum analysis, urine legionella and strep pneumo ordered - respiratory virus panel ordered   Atrial fibrillation - monitor on telemetry - continue Coumadin per pharmacy, continue atenolol, amiodarone, Cardizem    Leukocytosis - from bronchitis - ABX as noted above (Zithromax) - repeat CBC in AM  Radiological Exams on Admission: Dg Chest Port 1 View   09/19/2013 Mild diffuse peribronchial cuffing may suggest a bronchitis.      Code Status: Full Family Communication: Pt and wife at bedside Disposition Plan: Admit for further evaluation     Review of Systems:  Constitutional: Negative for diaphoresis.  HENT: Negative for hearing loss, ear pain, nosebleeds,  congestion, sore throat, neck pain, tinnitus and ear discharge.   Eyes: Negative for blurred vision, double vision, photophobia, pain, discharge and redness.  Respiratory: Per HPI    Cardiovascular: Negative for chest pain, palpitations, orthopnea, claudication and leg swelling.  Gastrointestinal:  Negative for heartburn, constipation, blood in stool and melena.  Genitourinary: Negative for dysuria, urgency, frequency, hematuria and flank pain.  Musculoskeletal: Negative for myalgias, back pain, joint pain and falls.  Skin: Negative for itching and rash.  Neurological: Negative for tingling, tremors, sensory change, speech change, focal weakness, loss of consciousness and headaches.  Endo/Heme/Allergies: Negative for environmental allergies and polydipsia. Does not bruise/bleed easily.  Psychiatric/Behavioral: Negative for suicidal ideas. The patient is not nervous/anxious.      Past Medical History  Diagnosis Date  . PAF (paroxysmal atrial fibrillation)   . Chronic anticoagulation   . HTN (hypertension)   . Chronic chest pain   . Obesity   . Chronic back pain   . Emphysema   . Chronic venous insufficiency   . Pneumothorax   . Rheumatoid arthritis(714.0)     Past Surgical History  Procedure Laterality Date  . Cardiac catheterization  1998    NORMAL  . Hernia repair    . Vein ligation and stripping    . Pleural scarification      Social History:  reports that he quit smoking about 17 years ago. His smoking use included Cigarettes. He has a 60 pack-year smoking history. He has never used smokeless tobacco. He reports that he drinks alcohol. He reports that he does not use illicit drugs.  No Known Allergies  Family History  Problem Relation Age  of Onset  . Other Father     CEREBRAL THROMBOSIS  . Cancer Mother     bone cancer  . Heart failure Brother 50  . Coronary artery disease Brother 52    PTCA  . Rheum arthritis Mother   . Rheum arthritis Brother   . Rheum  arthritis Brother     Prior to Admission medications   Medication Sig Start Date End Date Taking? Authorizing Provider  albuterol (PROVENTIL HFA;VENTOLIN HFA) 108 (90 BASE) MCG/ACT inhaler Inhale 2 puffs into the lungs every 6 (six) hours as needed for wheezing or shortness of breath.   Yes Historical Provider, MD  albuterol (PROVENTIL) (2.5 MG/3ML) 0.083% nebulizer solution Take 3 mLs (2.5 mg total) by nebulization every 4 (four) hours as needed for shortness of breath. 03/12/12  Yes Leslye Peer, MD  amiodarone (PACERONE) 200 MG tablet Take 0.5 tablets (100 mg total) by mouth daily.   Yes Peter M [place], MD  atenolol (TENORMIN) 50 MG tablet Take 50 mg by mouth 2 (two) times daily.   Yes Historical Provider, MD  diltiazem (CARDIZEM CD) 180 MG 24 hr capsule Take 1 capsule (180 mg total) by mouth daily. 09/05/13  Yes Peter M [place], MD  furosemide (LASIX) 40 MG tablet Take 40 mg by mouth daily.   Yes Historical Provider, MD  guaiFENesin (MUCINEX) 600 MG 12 hr tablet Take 600 mg by mouth 4 (four) times daily.   Yes Historical Provider, MD  hydrochlorothiazide (HYDRODIURIL) 25 MG tablet Take 25 mg by mouth daily.   Yes Historical Provider, MD  ipratropium-albuterol (DUONEB) 0.5-2.5 (3) MG/3ML SOLN Take 3 mLs by nebulization 4 (four) times daily. 11/11/12  Yes Leslye Peer, MD  omeprazole (PRILOSEC) 20 MG capsule Take 1 capsule (20 mg total) by mouth daily. 09/05/13  Yes Leslye Peer, MD  potassium chloride SA (K-DUR,KLOR-CON) 20 MEQ tablet Take 20 mEq by mouth 2 (two) times daily. 10/06/11  Yes Cassell Clement, MD  spironolactone (ALDACTONE) 25 MG tablet Take 1 tablet (25 mg total) by mouth daily. 12/13/12  Yes Cassell Clement, MD  warfarin (COUMADIN) 5 MG tablet Take 5 mg by mouth daily. Takes  2.5mg  on Monday, Wednesday, and Friday. Takes 5mg  on Tuesday, Thursday, Saturday, and Sunday.   Yes Historical Provider, MD    Physical Exam: Filed Vitals:   09/19/13 0808 09/19/13 0830 09/19/13 0900  09/19/13 0930  BP:  117/45 113/63 116/54  Pulse:  70 69 71  Temp:      TempSrc:      Resp:  24 24 27   SpO2: 99% 96% 95% 94%    Physical Exam  Constitutional: Appears well-developed and well-nourished. No distress.  HENT: Normocephalic. External right and left ear normal. Oropharynx is clear and moist.  Eyes: Conjunctivae and EOM are normal. PERRLA, no scleral icterus.  Neck: Normal ROM. Neck supple. No JVD. No tracheal deviation. No thyromegaly.  CVS: RRR, S1/S2 +, no murmurs, no gallops, no carotid bruit.  Pulmonary: Effort and breath sounds normal, bibasilar rhonchi with expiratory wheezing  Abdominal: Soft. BS +,  no distension, tenderness, rebound or guarding.  Musculoskeletal: Normal range of motion. No edema and no tenderness.  Lymphadenopathy: No lymphadenopathy noted, cervical, inguinal. Neuro: Alert. Normal reflexes, muscle tone coordination. No cranial nerve deficit. Skin: Skin is warm and dry. No rash noted. Not diaphoretic. No erythema. No pallor.  Psychiatric: Normal mood and affect. Behavior, judgment, thought content normal.   Labs on Admission:  Basic Metabolic Panel:  Recent  Labs Lab 09/19/13 0700  NA 134*  K 4.0  CL 93*  CO2 25  GLUCOSE 143*  BUN 20  CREATININE 1.31  CALCIUM 9.3   CBC:  Recent Labs Lab 09/19/13 0700  WBC 21.8*  NEUTROABS 17.5*  HGB 13.8  HCT 41.3  MCV 85.5  PLT 201   BNP: No components found with this basename: POCBNP,  CBG: No results found for this basename: GLUCAP,  in the last 168 hours  EKG: Atrial fibrillation with RVR  Debbora Presto, MD  [facility] Pager [phone]  If 7PM-7AM, please contact night-coverage [email or web address] Password [email or web address] 09/19/2013, 10:11 AM

## 2013-09-19 NOTE — Progress Notes (Signed)
Utilization Review completed.  Jeniyah Menor RN CM  

## 2013-09-19 NOTE — ED Notes (Signed)
Pt arrived to the ED with a complaint of shortness of breath.  Pt has a hx of COPD and CHF.  Pt has a O2 saturation level of 82.  Pt having difficulty breathing.  Pt legs are swollen and tight.  PT states this is a normal state for him.

## 2013-09-20 DIAGNOSIS — Z7901 Long term (current) use of anticoagulants: Secondary | ICD-10-CM

## 2013-09-20 LAB — BASIC METABOLIC PANEL
BUN: 29 mg/dL — AB (ref 6–23)
CALCIUM: 9.7 mg/dL (ref 8.4–10.5)
CO2: 27 mEq/L (ref 19–32)
CREATININE: 1.12 mg/dL (ref 0.50–1.35)
Chloride: 97 mEq/L (ref 96–112)
GFR, EST AFRICAN AMERICAN: 77 mL/min — AB (ref >90)
GFR, EST NON AFRICAN AMERICAN: 66 mL/min — AB (ref >90)
Glucose, Bld: 223 mg/dL — ABNORMAL HIGH (ref 70–99)
POTASSIUM: 3.9 meq/L (ref 3.7–5.3)
Sodium: 138 mEq/L (ref 137–147)

## 2013-09-20 LAB — CBC
HCT: 41.8 % (ref 39.0–52.0)
HEMOGLOBIN: 13.8 g/dL (ref 13.0–17.0)
MCH: 28 pg (ref 26.0–34.0)
MCHC: 33 g/dL (ref 30.0–36.0)
MCV: 84.8 fL (ref 78.0–100.0)
Platelets: 205 10*3/uL (ref 150–400)
RBC: 4.93 MIL/uL (ref 4.22–5.81)
RDW: 14.4 % (ref 11.5–15.5)
WBC: 21.5 10*3/uL — ABNORMAL HIGH (ref 4.0–10.5)

## 2013-09-20 LAB — GLUCOSE, CAPILLARY: GLUCOSE-CAPILLARY: 352 mg/dL — AB (ref 70–99)

## 2013-09-20 LAB — PROTIME-INR
INR: 1.98 — ABNORMAL HIGH (ref 0.00–1.49)
PROTHROMBIN TIME: 21.9 s — AB (ref 11.6–15.2)

## 2013-09-20 MED ORDER — WARFARIN SODIUM 2.5 MG PO TABS
2.5000 mg | ORAL_TABLET | Freq: Once | ORAL | Status: AC
  Administered 2013-09-20: 2.5 mg via ORAL
  Filled 2013-09-20: qty 1

## 2013-09-20 MED ORDER — INSULIN ASPART 100 UNIT/ML ~~LOC~~ SOLN
0.0000 [IU] | Freq: Three times a day (TID) | SUBCUTANEOUS | Status: DC
  Administered 2013-09-21: 5 [IU] via SUBCUTANEOUS
  Administered 2013-09-21 (×2): 7 [IU] via SUBCUTANEOUS
  Administered 2013-09-22: 5 [IU] via SUBCUTANEOUS
  Administered 2013-09-22: 3 [IU] via SUBCUTANEOUS

## 2013-09-20 MED ORDER — ZOLPIDEM TARTRATE 5 MG PO TABS
5.0000 mg | ORAL_TABLET | Freq: Every evening | ORAL | Status: DC | PRN
  Administered 2013-09-20: 5 mg via ORAL
  Filled 2013-09-20: qty 1

## 2013-09-20 MED ORDER — HYDROCOD POLST-CHLORPHEN POLST 10-8 MG/5ML PO LQCR
5.0000 mL | Freq: Two times a day (BID) | ORAL | Status: DC | PRN
  Administered 2013-09-21 (×2): 5 mL via ORAL
  Filled 2013-09-20 (×2): qty 5

## 2013-09-20 MED ORDER — INSULIN ASPART 100 UNIT/ML ~~LOC~~ SOLN
5.0000 [IU] | Freq: Once | SUBCUTANEOUS | Status: AC
  Administered 2013-09-20: 5 [IU] via SUBCUTANEOUS

## 2013-09-20 NOTE — Progress Notes (Signed)
ANTICOAGULATION CONSULT NOTE - Follow Up  Pharmacy Consult for Wafarin Indication: History Afib  No Known Allergies  Patient Measurements: Height: 6\' 8"  (203.2 cm) Weight: 347 lb 10.7 oz (157.7 kg) IBW/kg (Calculated) : 96 Heparin Dosing Weight:   Vital Signs: Temp: 97.5 F (36.4 C) (03/10 0606) Temp src: Oral (03/10 0606) BP: 157/57 mmHg (03/10 0606) Pulse Rate: 65 (03/10 0606)  Labs:  Recent Labs  09/19/13 0700 09/19/13 0857 09/20/13 0517  HGB 13.8  --  13.8  HCT 41.3  --  41.8  PLT 201  --  205  LABPROT  --  17.8* 21.9*  INR  --  1.51* 1.98*  CREATININE 1.31  --  1.12    Estimated Creatinine Clearance: 109.3 ml/min (by C-G formula based on Cr of 1.12).   Medical History: Past Medical History  Diagnosis Date  . PAF (paroxysmal atrial fibrillation)   . Chronic anticoagulation   . HTN (hypertension)   . Chronic chest pain   . Obesity   . Chronic back pain   . Emphysema   . Chronic venous insufficiency   . Pneumothorax   . Rheumatoid arthritis(714.0)     Assessment: 60 yoM with PMHx COPD, Afib on chronic warfarin, obesity, presents with persistent tachypnea and wheezing despite multiple breathing treatments.  CXR demonstrates bronchitis and pt to be admitted for antibiotics.  Pharmacy consulted to resume warfarin dosing inpatient.    Home dose warfarin: 5mg  daily except 2.5mg  on M/W/F.   INR = 1.98, increasing toward therapeutic goal.   CBC: No issues noted  Noted also on chronic amiodarone.  Started on azithromycin inpt which could cause elevations in INR.   Regular diet started.   Goal of Therapy:  INR 2-3 Monitor platelets by anticoagulation protocol: Yes   Plan:  Warfarin 2.5mg  po x 1 tonight Daily PT/INR  79, PharmD, BCPS Pager: [phone] 09/20/2013 8:49 AM

## 2013-09-20 NOTE — Progress Notes (Signed)
Patient ID: [name], male   DOB: 06-13-1946, 68 y.o.   MRN: [id number]  [facility] PROGRESS NOTE  [name] YQM:[id number] DOB: Mar 23, 1946 DOA: 09/19/2013 PCP: Fraser Din, PA-C  Brief narrative: Patient is 68 yo male who presented to [facility] with main concern of several days duration of progressively worsening shortness of breath, initially present with exertion and now present at rest, associated with fevers, chills, nausea, poor oral intake, malaise. Pt denies chest pain other than when coughing and is also having productive cough of yellow sputum. He denies any specific alleviating factors, no known sick contacts or exposures. No other abdominal or urinary concerns.   In ED, pt noted to be wheezing with oxygen saturation in high 80's on RA and requiring oxygen, albuterol nebulizer. He was also noted to be in atrial fibrillation with HR in 110's. TR[facility] asked to admit for presumptive COPD management. Telemetry bed requested.   Assessment and Plan:  Active Problems:  Respiratory distress  - appears to be related to acute COPD exacerbation and bronchitis  - pt is clinically improving and denies shortness of breath this AM - will continue to provide supportive care with BD's scheduled and as needed, solumedrol, empiric ABX  - sputum analysis, urine legionella and strep pneumo ordered and negative to date - respiratory virus panel pending  - plan on tapering solumedrol down and possible transition to Prednisone in AM Atrial fibrillation  - pt is in sinus rhythm this AM, no events on telemetry  - continue Coumadin per pharmacy, continue atenolol, amiodarone, Cardizem  Leukocytosis  - from bronchitis and now likely from steroids as well  - ABX as noted above (Zithromax)  - repeat CBC in AM  Hyperglycemia - likely from steroids - will check A1C and will place on SSI in the meantime   Radiological Exams on Admission:  Dg Chest Port 1 View 09/19/2013 Mild diffuse peribronchial  cuffing may suggest a bronchitis.  Consultants:  None Antibiotics:  Zithromax 3/9 -->   Code Status: Full Family Communication: Pt at bedside Disposition Plan: Home when medically stable, possibly in 1-2 days   HPI/Subjective: No events overnight.   Objective: Filed Vitals:   09/19/13 2104 09/20/13 0606 09/20/13 1413 09/20/13 1608  BP: 146/56 157/57 144/56   Pulse: 72 65 72   Temp: 97.6 F (36.4 C) 97.5 F (36.4 C) 97.6 F (36.4 C)   TempSrc: Oral Oral Oral   Resp: 24 24 24    Height: 6\' 8"  (2.032 m)     Weight: 157.7 kg (347 lb 10.7 oz)     SpO2: 95% 97% 96% 96%    Intake/Output Summary (Last 24 hours) at 09/20/13 1813 Last data filed at 09/20/13 1800  Gross per 24 hour  Intake   1240 ml  Output    600 ml  Net    640 ml    Exam:   General:  Pt is alert, follows commands appropriately, not in acute distress  Cardiovascular: Regular rate and rhythm, S1/S2, no murmurs, no rubs, no gallops  Respiratory: Clear to auscultation bilaterally, no wheezing, diminished breath sounds at bases   Abdomen: Soft, non tender, non distended, bowel sounds present, no guarding  Extremities: No edema, pulses DP and PT palpable bilaterally  Neuro: Grossly nonfocal  Data Reviewed: Basic Metabolic Panel:  Recent Labs Lab 09/19/13 0700 09/20/13 0517  NA 134* 138  K 4.0 3.9  CL 93* 97  CO2 25 27  GLUCOSE 143* 223*  BUN 20  29*  CREATININE 1.31 1.12  CALCIUM 9.3 9.7   CBC:  Recent Labs Lab 09/19/13 0700 09/20/13 0517  WBC 21.8* 21.5*  NEUTROABS 17.5*  --   HGB 13.8 13.8  HCT 41.3 41.8  MCV 85.5 84.8  PLT 201 205   Recent Results (from the past 240 hour(s))  CULTURE, BLOOD (ROUTINE X 2)     Status: None   Collection Time    09/19/13  7:00 AM      Result Value Ref Range Status   Specimen Description BLOOD LEFT FOOT   Final   Special Requests BOTTLES DRAWN AEROBIC AND ANAEROBIC 3CC   Final   Culture  Setup Time     Final   Value: 09/19/2013 10:49      Performed at [facility]   Culture     Final   Value:        BLOOD CULTURE RECEIVED NO GROWTH TO DATE CULTURE WILL BE HELD FOR 5 DAYS BEFORE ISSUING A FINAL NEGATIVE REPORT     Performed at [facility]   Report Status PENDING   Incomplete  CULTURE, BLOOD (ROUTINE X 2)     Status: None   Collection Time    09/19/13  7:30 AM      Result Value Ref Range Status   Specimen Description BLOOD LEFT HAND   Final   Special Requests BOTTLES DRAWN AEROBIC AND ANAEROBIC   Final   Culture  Setup Time     Final   Value: 09/19/2013 10:49     Performed at [facility]   Culture     Final   Value:        BLOOD CULTURE RECEIVED NO GROWTH TO DATE CULTURE WILL BE HELD FOR 5 DAYS BEFORE ISSUING A FINAL NEGATIVE REPORT     Performed at [facility]   Report Status PENDING   Incomplete     Scheduled Meds: . amiodarone  100 mg Oral Daily  . atenolol  50 mg Oral BID  . azithromycin  500 mg Intravenous Q24H  . diltiazem  180 mg Oral Daily  . furosemide  40 mg Oral Daily  . guaiFENesin  600 mg Oral QID  . hydrochlorothiazide  25 mg Oral Daily  . ipratropium-albuterol  3 mL Nebulization QID  . methylPREDNISolone (SOLU-MEDROL) injection  60 mg Intravenous Q6H  . pantoprazole  40 mg Oral Daily  . potassium chloride SA  20 mEq Oral BID  . spironolactone  25 mg Oral Daily  . Warfarin - Pharmacist Dosing Inpatient   Does not apply q1800   Continuous Infusions:   Debbora Presto, MD  [facility] Pager [phone]  If 7PM-7AM, please contact night-coverage [email or web address] Password [email or web address] 09/20/2013, 6:13 PM   LOS: 1 day

## 2013-09-21 DIAGNOSIS — J4489 Other specified chronic obstructive pulmonary disease: Secondary | ICD-10-CM

## 2013-09-21 DIAGNOSIS — J449 Chronic obstructive pulmonary disease, unspecified: Secondary | ICD-10-CM

## 2013-09-21 DIAGNOSIS — R0602 Shortness of breath: Secondary | ICD-10-CM

## 2013-09-21 LAB — BASIC METABOLIC PANEL
BUN: 34 mg/dL — ABNORMAL HIGH (ref 6–23)
CALCIUM: 9.6 mg/dL (ref 8.4–10.5)
CO2: 26 meq/L (ref 19–32)
CREATININE: 1.18 mg/dL (ref 0.50–1.35)
Chloride: 98 mEq/L (ref 96–112)
GFR calc non Af Amer: 62 mL/min — ABNORMAL LOW (ref >90)
GFR, EST AFRICAN AMERICAN: 72 mL/min — AB (ref >90)
Glucose, Bld: 322 mg/dL — ABNORMAL HIGH (ref 70–99)
Potassium: 3.6 mEq/L — ABNORMAL LOW (ref 3.7–5.3)
SODIUM: 138 meq/L (ref 137–147)

## 2013-09-21 LAB — LEGIONELLA ANTIGEN, URINE: Legionella Antigen, Urine: NEGATIVE

## 2013-09-21 LAB — RESPIRATORY VIRUS PANEL
Adenovirus: NOT DETECTED
INFLUENZA A H1: NOT DETECTED
INFLUENZA A H3: NOT DETECTED
Influenza A: NOT DETECTED
Influenza B: DETECTED — AB
Metapneumovirus: NOT DETECTED
PARAINFLUENZA 3 A: NOT DETECTED
Parainfluenza 1: NOT DETECTED
Parainfluenza 2: NOT DETECTED
RESPIRATORY SYNCYTIAL VIRUS A: NOT DETECTED
RHINOVIRUS: NOT DETECTED
Respiratory Syncytial Virus B: NOT DETECTED

## 2013-09-21 LAB — HEMOGLOBIN A1C
HEMOGLOBIN A1C: 7.1 % — AB (ref <5.7)
Mean Plasma Glucose: 157 mg/dL — ABNORMAL HIGH (ref <117)

## 2013-09-21 LAB — PROTIME-INR
INR: 2.26 — ABNORMAL HIGH (ref 0.00–1.49)
Prothrombin Time: 24.2 seconds — ABNORMAL HIGH (ref 11.6–15.2)

## 2013-09-21 LAB — CBC
HCT: 38.6 % — ABNORMAL LOW (ref 39.0–52.0)
Hemoglobin: 12.6 g/dL — ABNORMAL LOW (ref 13.0–17.0)
MCH: 28.3 pg (ref 26.0–34.0)
MCHC: 32.6 g/dL (ref 30.0–36.0)
MCV: 86.5 fL (ref 78.0–100.0)
PLATELETS: 238 10*3/uL (ref 150–400)
RBC: 4.46 MIL/uL (ref 4.22–5.81)
RDW: 14.5 % (ref 11.5–15.5)
WBC: 27.7 10*3/uL — AB (ref 4.0–10.5)

## 2013-09-21 LAB — GLUCOSE, CAPILLARY
Glucose-Capillary: 310 mg/dL — ABNORMAL HIGH (ref 70–99)
Glucose-Capillary: 348 mg/dL — ABNORMAL HIGH (ref 70–99)
Glucose-Capillary: 288 mg/dL — ABNORMAL HIGH (ref 70–99)
Glucose-Capillary: 267 mg/dL — ABNORMAL HIGH (ref 70–99)

## 2013-09-21 MED ORDER — WARFARIN SODIUM 2.5 MG PO TABS
2.5000 mg | ORAL_TABLET | Freq: Once | ORAL | Status: AC
  Administered 2013-09-21: 2.5 mg via ORAL
  Filled 2013-09-21: qty 1

## 2013-09-21 MED ORDER — OSELTAMIVIR PHOSPHATE 75 MG PO CAPS
75.0000 mg | ORAL_CAPSULE | Freq: Two times a day (BID) | ORAL | Status: DC
  Administered 2013-09-21 – 2013-09-22 (×2): 75 mg via ORAL
  Filled 2013-09-21 (×3): qty 1

## 2013-09-21 MED ORDER — PREDNISONE 20 MG PO TABS
40.0000 mg | ORAL_TABLET | Freq: Every day | ORAL | Status: DC
  Administered 2013-09-22: 40 mg via ORAL
  Filled 2013-09-21 (×2): qty 2

## 2013-09-21 NOTE — Progress Notes (Signed)
Patient ID: [name], male   DOB: [birth year], 68 y.o.   MRN: [id number]  [facility] PROGRESS NOTE  [name] NTI:[id number] DOB: Nov 21, 1945 DOA: 09/19/2013 PCP: Fraser Din, PA-C  Brief narrative: Patient is 68 yo male who presented to [facility] with main concern of several days duration of progressively worsening shortness of breath, initially present with exertion and now present at rest, associated with fevers, chills, nausea, poor oral intake, malaise. Pt denies chest pain other than when coughing and is also having productive cough of yellow sputum. He denies any specific alleviating factors, no known sick contacts or exposures. No other abdominal or urinary concerns.   In ED, pt noted to be wheezing with oxygen saturation in high 80's on RA and requiring oxygen, albuterol nebulizer. He was also noted to be in atrial fibrillation with HR in 110's. [facility] asked to admit for presumptive COPD management. Telemetry bed requested.   Assessment and Plan:   Acute Respiratory Failure - appears to be related to acute COPD exacerbation and bronchitis. - pt is clinically improving. - will continue to provide supportive care with BD's scheduled and as needed, solumedrol, empiric ABX. - sputum analysis, urine legionella and strep pneumo ordered and negative to date - respiratory virus panel pending  - plan on tapering solumedrol down and possible transition to Prednisone in AM.  Atrial fibrillation  - pt is in sinus rhythm this AM, no events on telemetry.  - continue Coumadin per pharmacy, continue atenolol, amiodarone, Cardizem   Leukocytosis  - from bronchitis and now likely from steroids as well  - ABX as noted above (Zithromax)  - repeat CBC in AM   Hyperglycemia - likely from steroids - will check A1C and will place on SSI in the meantime   Radiological Exams on Admission:  Dg Chest Port 1 View 09/19/2013 Mild diffuse peribronchial cuffing may suggest a bronchitis.    Consultants:  None   Antibiotics:  Zithromax 3/9 -->   Code Status: Full Family Communication: Pt at bedside Disposition Plan: Home when medically stable, possibly in 1-2 days   HPI/Subjective: No events overnight.   Objective: Filed Vitals:   09/20/13 2126 09/21/13 0431 09/21/13 0718 09/21/13 1219  BP: 143/49 149/65    Pulse: 82 78    Temp: 98.4 F (36.9 C) 97.5 F (36.4 C)    TempSrc: Oral Oral    Resp: 22 22    Height:      Weight:  157.8 kg (347 lb 14.2 oz)    SpO2: 96% 95% 93% 96%    Intake/Output Summary (Last 24 hours) at 09/21/13 1512 Last data filed at 09/21/13 0900  Gross per 24 hour  Intake    480 ml  Output   1020 ml  Net   -540 ml    Exam:   General:  Pt is alert, follows commands appropriately, not in acute distress  Cardiovascular: Regular rate and rhythm, S1/S2, no murmurs, no rubs, no gallops  Respiratory: Clear to auscultation bilaterally, no wheezing, diminished breath sounds at bases   Abdomen: Soft, non tender, non distended, bowel sounds present, no guarding  Extremities: No edema, pulses DP and PT palpable bilaterally  Neuro: Grossly nonfocal  Data Reviewed: Basic Metabolic Panel:  Recent Labs Lab 09/19/13 0700 09/20/13 0517 09/21/13 0355  NA 134* 138 138  K 4.0 3.9 3.6*  CL 93* 97 98  CO2 25 27 26   GLUCOSE 143* 223* 322*  BUN 20 29* 34*  CREATININE 1.31  1.12 1.18  CALCIUM 9.3 9.7 9.6   CBC:  Recent Labs Lab 09/19/13 0700 09/20/13 0517 09/21/13 0355  WBC 21.8* 21.5* 27.7*  NEUTROABS 17.5*  --   --   HGB 13.8 13.8 12.6*  HCT 41.3 41.8 38.6*  MCV 85.5 84.8 86.5  PLT 201 205 238   Recent Results (from the past 240 hour(s))  CULTURE, BLOOD (ROUTINE X 2)     Status: None   Collection Time    09/19/13  7:00 AM      Result Value Ref Range Status   Specimen Description BLOOD LEFT FOOT   Final   Special Requests BOTTLES DRAWN AEROBIC AND ANAEROBIC 3CC   Final   Culture  Setup Time     Final   Value:  09/19/2013 10:49     Performed at [facility]   Culture     Final   Value:        BLOOD CULTURE RECEIVED NO GROWTH TO DATE CULTURE WILL BE HELD FOR 5 DAYS BEFORE ISSUING A FINAL NEGATIVE REPORT     Performed at [facility]   Report Status PENDING   Incomplete  CULTURE, BLOOD (ROUTINE X 2)     Status: None   Collection Time    09/19/13  7:30 AM      Result Value Ref Range Status   Specimen Description BLOOD LEFT HAND   Final   Special Requests BOTTLES DRAWN AEROBIC AND ANAEROBIC   Final   Culture  Setup Time     Final   Value: 09/19/2013 10:49     Performed at [facility]   Culture     Final   Value:        BLOOD CULTURE RECEIVED NO GROWTH TO DATE CULTURE WILL BE HELD FOR 5 DAYS BEFORE ISSUING A FINAL NEGATIVE REPORT     Performed at [facility]   Report Status PENDING   Incomplete     Scheduled Meds: . amiodarone  100 mg Oral Daily  . atenolol  50 mg Oral BID  . azithromycin  500 mg Intravenous Q24H  . diltiazem  180 mg Oral Daily  . furosemide  40 mg Oral Daily  . guaiFENesin  600 mg Oral QID  . hydrochlorothiazide  25 mg Oral Daily  . ipratropium-albuterol  3 mL Nebulization QID  . methylPREDNISolone (SOLU-MEDROL) injection  60 mg Intravenous Q6H  . pantoprazole  40 mg Oral Daily  . potassium chloride SA  20 mEq Oral BID  . spironolactone  25 mg Oral Daily  . Warfarin - Pharmacist Dosing Inpatient   Does not apply q1800   Continuous Infusions:   Chaya Jan, MD  [facility] Pager [phone]  If 7PM-7AM, please contact night-coverage [email or web address] Password [email or web address] 09/21/2013, 3:12 PM   LOS: 2 days

## 2013-09-21 NOTE — Progress Notes (Signed)
Inpatient Diabetes Program Recommendations  AACE/ADA: New Consensus Statement on Inpatient Glycemic Control (2013)  Target Ranges:  Prepandial:   less than 140 mg/dL      Peak postprandial:   less than 180 mg/dL (1-2 hours)      Critically ill patients:  140 - 180 mg/dL   Reason for Visit: Hyperglycemia  Diabetes history: None Outpatient Diabetes medications: None Current orders for Inpatient glycemic control: Novolog sensitive tidwc  68 year old male admitted for SOB. Appears to be related to COPD and bronchitis. No hx DM. On Solumedrol 60 mg Q6H.  Results for JOCELYN, LOWERY (MRN [id number]) as of 09/21/2013 10:16  Ref. Range 09/20/2013 21:24 09/21/2013 07:42  Glucose-Capillary Latest Range: 70-99 mg/dL 481 (H) 856 (H)  Results for SAMAY, DELCARLO (MRN [id number]) as of 09/21/2013 10:16  Ref. Range 09/20/2013 05:17 09/21/2013 03:55  Glucose Latest Range: 70-99 mg/dL 785 (H) 885 (H)    Inpatient Diabetes Program Recommendations Insulin - Basal: Add Lantus 20 units QHS Correction (SSI): Increase Novolog to resistant tidwc and hs Insulin - Meal Coverage: Will likely need meal coverage insulin - Novolog 4 units tidwc if pt eats >50% meal HgbA1C: Pending Diet: Change diet to CHO mod med  Note: Will continue to follow. Thank you. Ailene Ards, RD, LDN, CDE Inpatient Diabetes Coordinator [phone]

## 2013-09-21 NOTE — Progress Notes (Signed)
ANTICOAGULATION CONSULT NOTE - Follow Up  Pharmacy Consult for Wafarin Indication: History Afib  No Known Allergies  Patient Measurements: Height: 6\' 8"  (203.2 cm) Weight: 347 lb 14.2 oz (157.8 kg) IBW/kg (Calculated) : 96  Vital Signs: Temp: 97.5 F (36.4 C) (03/11 0431) Temp src: Oral (03/11 0431) BP: 149/65 mmHg (03/11 0431) Pulse Rate: 78 (03/11 0431)  Labs:  Recent Labs  09/19/13 0700 09/19/13 0857 09/20/13 0517 09/21/13 0355  HGB 13.8  --  13.8 12.6*  HCT 41.3  --  41.8 38.6*  PLT 201  --  205 238  LABPROT  --  17.8* 21.9* 24.2*  INR  --  1.51* 1.98* 2.26*  CREATININE 1.31  --  1.12 1.18    Estimated Creatinine Clearance: 103.7 ml/min (by C-G formula based on Cr of 1.18).   Assessment: 43 yoM with PMHx COPD, Afib on chronic warfarin, obesity, presents with persistent tachypnea and wheezing despite multiple breathing treatments.  CXR demonstrates bronchitis and pt to be admitted for antibiotics.  Pharmacy consulted to resume warfarin dosing inpatient.     Home dose warfarin: 5mg  daily except 2.5mg  on M/W/F - INR subtherapeutic on admit (1.51).    INR is now therapeutic today 2.26  CBC stable, no bleeding reported  Tolerating regular diet  Note potential drug interaction with azithromycin, can lead to elevation in INR. Amiodarone is a chronic med, so unlikely to have an effect on INR at this time  Goal of Therapy:  INR 2-3 Monitor platelets by anticoagulation protocol: Yes   Plan:   Warfarin 2.5mg  po x 1 tonight  Daily PT/INR  79, PharmD, BCPS Pager: [phone]  09/21/2013 1:13 PM

## 2013-09-21 NOTE — Clinical Documentation Improvement (Signed)
Noted 09/19/13 ED Rec.Marland KitchenMarland Kitchen"This patient presents with days of worsening respiratory failure. On exam patient is tachypneic, hypoxic, diaphoretic, and in distress. Patient presents in the emergency department with multiple nebulizer treatments, steroids, antibiotics, fluids.".Marland KitchenMarland Kitchen Noted 09/20/13 progr note.Marland KitchenMarland Kitchen"Respiratory distress - appears to be related to acute COPD exacerbation and bronchitis  - pt is clinically improving and denies shortness of breath this AM - will continue to provide supportive care with BD's scheduled and as needed, solumedrol, empiric ABX..."  For accurate Dx specificity & severity can noted "Resp distress" be further specified w/ cond being mon'd, eval'd & tx'd. Thank you  Possible Clinical Conditions? Acute Respiratory Failure Acute on Chronic Respiratory Failure Chronic Respiratory Failure Acute Respiratory Insufficiency Other Condition Cannot Clinically Determine   Supporting Information: Risk Factors: See above note Signs & Symptoms: See above note Diagnostics: See above note Treatment: See above note  Thank You, Toribio Harbour, RN, BSN, CCDS Certified Clinical Documentation Specialist Pager: [phone] [facility]- Health Information Management

## 2013-09-21 NOTE — Evaluation (Signed)
Physical Therapy Evaluation Patient Details Name: [name] MRN: [id number] DOB: 16-Sep-1945 Today's Date: 09/21/2013 Time: 1040-1109 PT Time Calculation (min): 29 min  PT Assessment / Plan / Recommendation History of Present Illness  68 yo male admitted with respiratory distress. Hx of COPD, CHF  Clinical Impression  On eval, pt required Min guard assist for mobility-able to ambulate ~115 feet while pushing IV pole. Fatigues fairly easily. Dsypnea 3/4. O2 sats 89% RA during ambulation. Recommend daily mobility with nursing. Recommend HHPT.     PT Assessment  Patient needs continued PT services    Follow Up Recommendations  Home health PT    Does the patient have the potential to tolerate intense rehabilitation      Barriers to Discharge        Equipment Recommendations       Recommendations for Other Services     Frequency      Precautions / Restrictions Precautions Precautions: Fall Restrictions Weight Bearing Restrictions: No   Pertinent Vitals/Pain No c/o pain      Mobility  Bed Mobility Overal bed mobility: Modified Independent Transfers Overall transfer level: Needs assistance Transfers: Sit to/from Stand Sit to Stand: Supervision Ambulation/Gait Ambulation/Gait assistance: Min guard Ambulation Distance (Feet): 115 Feet Assistive device:  (pt pushed IV pole) Gait Pattern/deviations: Decreased stride length General Gait Details: slow gait speed. fatigues fairly easily. O2 sats 89% on RA. Dyspnea 3/4. R LE instability noted intermittently (pt stated he has chronic R hip problems)    Exercises     PT Diagnosis: Difficulty walking;Generalized weakness  PT Problem List: Decreased strength;Decreased activity tolerance;Decreased mobility PT Treatment Interventions: Gait training;DME instruction;Functional mobility training;Therapeutic activities;Therapeutic exercise;Patient/family education     PT Goals(Current goals can be found in the care plan  section) Acute Rehab PT Goals Patient Stated Goal: home soon PT Goal Formulation: With patient Time For Goal Achievement: 10/05/13 Potential to Achieve Goals: Good  Visit Information  Last PT Received On: 09/21/13 Assistance Needed: +1 History of Present Illness: 68 yo male admitted with respiratory distress. Hx of COPD, CHF       Prior Functioning  Home Living Family/patient expects to be discharged to:: Private residence Living Arrangements: Spouse/significant other Type of Home: House Home Access: Stairs to enter Secretary/administrator of Steps: 6 Entrance Stairs-Rails: Right Home Layout: Two level;Bed/bath upstairs Home Equipment: Cane - single point Prior Function Level of Independence: Independent with assistive device(s) Comments: uses cane occasionally Communication Communication: No difficulties    Cognition  Cognition Arousal/Alertness: Awake/alert Behavior During Therapy: WFL for tasks assessed/performed Overall Cognitive Status: Within Functional Limits for tasks assessed    Extremity/Trunk Assessment Upper Extremity Assessment Upper Extremity Assessment: Overall WFL for tasks assessed Lower Extremity Assessment Lower Extremity Assessment: Generalized weakness Cervical / Trunk Assessment Cervical / Trunk Assessment: Normal   Balance    End of Session PT - End of Session Activity Tolerance: Patient limited by fatigue Patient left: in chair;with call bell/phone within reach  GP     Rebeca Alert, MPT Pager: [phone]

## 2013-09-22 DIAGNOSIS — J111 Influenza due to unidentified influenza virus with other respiratory manifestations: Secondary | ICD-10-CM

## 2013-09-22 LAB — GLUCOSE, CAPILLARY
GLUCOSE-CAPILLARY: 296 mg/dL — AB (ref 70–99)
Glucose-Capillary: 237 mg/dL — ABNORMAL HIGH (ref 70–99)

## 2013-09-22 LAB — BASIC METABOLIC PANEL
BUN: 38 mg/dL — AB (ref 6–23)
CO2: 26 meq/L (ref 19–32)
Calcium: 9.6 mg/dL (ref 8.4–10.5)
Chloride: 100 mEq/L (ref 96–112)
Creatinine, Ser: 1.09 mg/dL (ref 0.50–1.35)
GFR calc Af Amer: 79 mL/min — ABNORMAL LOW (ref >90)
GFR calc non Af Amer: 68 mL/min — ABNORMAL LOW (ref >90)
Glucose, Bld: 273 mg/dL — ABNORMAL HIGH (ref 70–99)
POTASSIUM: 3.9 meq/L (ref 3.7–5.3)
SODIUM: 141 meq/L (ref 137–147)

## 2013-09-22 LAB — CBC
HCT: 39.4 % (ref 39.0–52.0)
HEMOGLOBIN: 12.8 g/dL — AB (ref 13.0–17.0)
MCH: 28.3 pg (ref 26.0–34.0)
MCHC: 32.5 g/dL (ref 30.0–36.0)
MCV: 87.2 fL (ref 78.0–100.0)
Platelets: 245 10*3/uL (ref 150–400)
RBC: 4.52 MIL/uL (ref 4.22–5.81)
RDW: 14.7 % (ref 11.5–15.5)
WBC: 24.4 10*3/uL — ABNORMAL HIGH (ref 4.0–10.5)

## 2013-09-22 LAB — PROTIME-INR
INR: 2.31 — ABNORMAL HIGH (ref 0.00–1.49)
Prothrombin Time: 24.6 seconds — ABNORMAL HIGH (ref 11.6–15.2)

## 2013-09-22 MED ORDER — WARFARIN SODIUM 2.5 MG PO TABS
2.5000 mg | ORAL_TABLET | Freq: Once | ORAL | Status: DC
Start: 2013-09-22 — End: 2013-09-22
  Filled 2013-09-22: qty 1

## 2013-09-22 MED ORDER — PREDNISONE (PAK) 10 MG PO TABS: ORAL_TABLET | Freq: Every day | ORAL | Status: DC

## 2013-09-22 MED ORDER — OSELTAMIVIR PHOSPHATE 75 MG PO CAPS: 75.0000 mg | ORAL_CAPSULE | Freq: Two times a day (BID) | ORAL | Status: DC

## 2013-09-22 MED ORDER — PHENOL 1.4 % MT LIQD
1.0000 | OROMUCOSAL | Status: DC | PRN
  Filled 2013-09-22: qty 177

## 2013-09-22 NOTE — Progress Notes (Signed)
ANTICOAGULATION CONSULT NOTE - Follow Up  Pharmacy Consult for Wafarin Indication: History Afib  No Known Allergies  Patient Measurements: Height: 6\' 8"  (203.2 cm) Weight: 351 lb 13.7 oz (159.6 kg) IBW/kg (Calculated) : 96  Vital Signs: Temp: 98.1 F (36.7 C) (03/12 0457) Temp src: Oral (03/12 0457) BP: 128/50 mmHg (03/12 0457) Pulse Rate: 65 (03/12 0457)  Labs:  Recent Labs  09/20/13 0517 09/21/13 0355 09/22/13 0450  HGB 13.8 12.6* 12.8*  HCT 41.8 38.6* 39.4  PLT 205 238 245  LABPROT 21.9* 24.2* 24.6*  INR 1.98* 2.26* 2.31*  CREATININE 1.12 1.18 1.09    Estimated Creatinine Clearance: 112.9 ml/min (by C-G formula based on Cr of 1.09).   Assessment: 89 yoM with PMHx COPD, Afib on chronic warfarin, obesity, presents with persistent tachypnea and wheezing despite multiple breathing treatments.  CXR demonstrates bronchitis and pt to be admitted for antibiotics.  Pharmacy consulted to resume warfarin dosing inpatient.     Home dose warfarin: 5mg  daily except 2.5mg  on M/W/F - INR subtherapeutic on admit (1.51).    INR therapeutic again today  CBC stable, no bleeding reported  Tolerating regular diet  Note potential drug interaction with azithromycin, can lead to elevation in INR. Amiodarone is a chronic med, so unlikely to have an effect on INR at this time  Goal of Therapy:  INR 2-3 Monitor platelets by anticoagulation protocol: Yes   Plan:   Repeat Warfarin 2.5mg  po x 1 tonight  Daily PT/INR  If discharged would recommend restarting home dose of warfarin as above  79, PharmD, BCPS Pager [phone] 09/22/2013 10:38 AM

## 2013-09-22 NOTE — Progress Notes (Signed)
Results for ROCKO, FESPERMAN (MRN [id number]) as of 09/22/2013 10:52  Ref. Range 09/21/2013 07:42 09/21/2013 12:14 09/21/2013 17:03 09/21/2013 21:55 09/22/2013 07:21  Glucose-Capillary Latest Range: 70-99 mg/dL 628 (H) 366 (H) 294 (H) 267 (H) 237 (H)   CBGs continue to be elevated.  HgbA1C is 7.1%.   A1C of 6.5% is diagnosis of diabetes according to the American Diabetes Association.  If CBGs continue greater than 180 mg/dl, recommend adding Lantus 20 units daily and increase Novolog correction scale to RESISTANT TID and HS.   Will continue to follow while in hospital.  Smith Mince RN BSN CDE

## 2013-09-22 NOTE — Discharge Summary (Signed)
Physician Discharge Summary  [name] VNC:[id number] DOB: 1946-07-03 DOA: 09/19/2013  PCP: Fraser Din, PA-C  Admit date: 09/19/2013 Discharge date: 09/22/2013  Time spent: 45 minutes  Recommendations for Outpatient Follow-up:  -Will be discharged home today. -Advised to follow up with PCP in 2 weeks. -Has 4 days of tamiflu remaining o DC.   Discharge Diagnoses:  Active Problems:   COPD (chronic obstructive pulmonary disease)   Respiratory distress   Influenza with other respiratory manifestations   Discharge Condition: Stable and improved  Filed Weights   09/19/13 2104 09/21/13 0431 09/22/13 0457  Weight: 157.7 kg (347 lb 10.7 oz) 157.8 kg (347 lb 14.2 oz) 159.6 kg (351 lb 13.7 oz)    History of present illness:  Patient is 68 yo male who presented to [facility] with main concern of several days duration of progressively worsening shortness of breath, initially present with exertion and now present at rest, associated with fevers, chills, nausea, poor oral intake, malaise. Pt denies chest pain other than when coughing and is also having productive cough of yellow sputum. He denies any specific alleviating factors, no known sick contacts or exposures. No other abdominal or urinary concerns.  In ED, pt noted to be wheezing with oxygen saturation in high 80's on RA and requiring oxygen, albuterol nebulizer. He was also noted to be in atrial fibrillation with HR in 110's. TR[facility] asked to admit for presumptive COPD management. Telemetry bed requested.    Hospital Course:   Acute Respiratory Failure  - appears to be related to acute COPD exacerbation likely exacerbated by influenza.  - pt is clinically improving.  - Will DC on a prednisone taper.' - Tamiflu for 4 more days. - sputum analysis, urine legionella and strep pneumo ordered and negative to date   Atrial fibrillation  - pt is in sinus rhythm this AM, no events on telemetry.  - continue Coumadin per pharmacy, continue  atenolol, amiodarone, Cardizem   Leukocytosis  -Likely from flu and steroid-induced.  Procedures:  None   Consultations:  None  Discharge Instructions  Discharge Orders   Future Appointments Provider Department Dept Phone   10/03/2013 4:00 PM [facility] [place] Office [phone]   11/02/2013 4:15 PM Peter M Sw[place], MD [facility] [phone]   Future Orders Complete By Expires   Diet - low sodium heart healthy  As directed    Discontinue IV  As directed    Increase activity slowly  As directed        Medication List         albuterol (2.5 MG/3ML) 0.083% nebulizer solution  Commonly known as:  PROVENTIL  Take 3 mLs (2.5 mg total) by nebulization every 4 (four) hours as needed for shortness of breath.     amiodarone 200 MG tablet  Commonly known as:  PACERONE  Take 0.5 tablets (100 mg total) by mouth daily.     atenolol 50 MG tablet  Commonly known as:  TENORMIN  Take 50 mg by mouth 2 (two) times daily.     diltiazem 180 MG 24 hr capsule  Commonly known as:  CARDIZEM CD  Take 1 capsule (180 mg total) by mouth daily.     furosemide 40 MG tablet  Commonly known as:  LASIX  Take 40 mg by mouth daily.     guaiFENesin 600 MG 12 hr tablet  Commonly known as:  MUCINEX  Take 600 mg by mouth 4 (four) times daily.  hydrochlorothiazide 25 MG tablet  Commonly known as:  HYDRODIURIL  Take 25 mg by mouth daily.     ipratropium-albuterol 0.5-2.5 (3) MG/3ML Soln  Commonly known as:  DUONEB  Take 3 mLs by nebulization 4 (four) times daily.     omeprazole 20 MG capsule  Commonly known as:  PRILOSEC  Take 1 capsule (20 mg total) by mouth daily.     oseltamivir 75 MG capsule  Commonly known as:  TAMIFLU  Take 1 capsule (75 mg total) by mouth 2 (two) times daily. Four more days     potassium chloride SA 20 MEQ tablet  Commonly known as:  K-DUR,KLOR-CON  Take 20 mEq by mouth 2 (two) times daily.     predniSONE  10 MG tablet  Commonly known as:  STERAPRED UNI-PAK  Take by mouth daily. Take 6 tablets today and then decrease by 1 tablet daily until none are left.     spironolactone 25 MG tablet  Commonly known as:  ALDACTONE  Take 1 tablet (25 mg total) by mouth daily.     warfarin 5 MG tablet  Commonly known as:  COUMADIN  Take 5 mg by mouth daily. Takes  2.$Remove'5mg'ZgYmGLV$  on Monday, Wednesday, and Friday. Takes $RemoveBefor'5mg'klsqEomWGcmj$  on Tuesday, Thursday, Saturday, and Sunday.       No Known Allergies     Follow-up Information   Follow up with Regis Bill, PA-C. Schedule an appointment as soon as possible for a visit in 2 weeks.   Specialty:  Physician Assistant   Contact information:   [address] [place] [address] [phone]        The results of significant diagnostics from this hospitalization (including imaging, microbiology, ancillary and laboratory) are listed below for reference.    Significant Diagnostic Studies: Dg Chest Port 1 View  09/19/2013   CLINICAL DATA:  Cough, shortness of breath and fever.  EXAM: PORTABLE CHEST - 1 VIEW  COMPARISON:  Chest x-ray 04/15/2013.  FINDINGS: Mild peribronchial cuffing diffusely. Lung volumes are normal. No consolidative airspace disease. No pleural effusions. No pneumothorax. No pulmonary nodule or mass noted. Pulmonary vasculature and the cardiomediastinal silhouette are within normal limits.  IMPRESSION: 1. Mild diffuse peribronchial cuffing may suggest a bronchitis.   Electronically Signed   By: Vinnie Langton M.D.   On: 09/19/2013 07:23    Microbiology: Recent Results (from the past 240 hour(s))  CULTURE, BLOOD (ROUTINE X 2)     Status: None   Collection Time    09/19/13  7:00 AM      Result Value Ref Range Status   Specimen Description BLOOD LEFT FOOT   Final   Special Requests BOTTLES DRAWN AEROBIC AND ANAEROBIC 3CC   Final   Culture  Setup Time     Final   Value: 09/19/2013 10:49     Performed at [facility]   Culture      Final   Value:        BLOOD CULTURE RECEIVED NO GROWTH TO DATE CULTURE WILL BE HELD FOR 5 DAYS BEFORE ISSUING A FINAL NEGATIVE REPORT     Performed at [facility]   Report Status PENDING   Incomplete  CULTURE, BLOOD (ROUTINE X 2)     Status: None   Collection Time    09/19/13  7:30 AM      Result Value Ref Range Status   Specimen Description BLOOD LEFT HAND   Final   Special Requests BOTTLES DRAWN AEROBIC AND  ANAEROBIC 5ML   Final   Culture  Setup Time     Final   Value: 09/19/2013 10:49     Performed at [facility]   Culture     Final   Value:        BLOOD CULTURE RECEIVED NO GROWTH TO DATE CULTURE WILL BE HELD FOR 5 DAYS BEFORE ISSUING A FINAL NEGATIVE REPORT     Performed at [facility]   Report Status PENDING   Incomplete  RESPIRATORY VIRUS PANEL     Status: Abnormal   Collection Time    09/19/13  7:52 PM      Result Value Ref Range Status   Source - RVPAN NOSE   Final   Respiratory Syncytial Virus A NOT DETECTED   Final   Respiratory Syncytial Virus B NOT DETECTED   Final   Influenza A NOT DETECTED   Final   Influenza B DETECTED (*)  Final   Parainfluenza 1 NOT DETECTED   Final   Parainfluenza 2 NOT DETECTED   Final   Parainfluenza 3 NOT DETECTED   Final   Metapneumovirus NOT DETECTED   Final   Rhinovirus NOT DETECTED   Final   Adenovirus NOT DETECTED   Final   Influenza A H1 NOT DETECTED   Final   Influenza A H3 NOT DETECTED   Final   Comment: (NOTE)           Normal Reference Range for each Analyte: NOT DETECTED     Testing performed using the Luminex xTAG Respiratory Viral Panel test     kit.     This test was developed and its performance characteristics determined     by [facility]. It has not been cleared or approved by the [place]     Food and Drug Administration. This test is used for clinical purposes.     It should not be regarded as investigational or for research. This     laboratory is certified under the [place] (CLIA) as qualified to perform high complexity     clinical laboratory testing.     Performed at [facility]: Basic Metabolic Panel:  Recent Labs Lab 09/19/13 0700 09/20/13 0517 09/21/13 0355 09/22/13 0450  NA 134* 138 138 141  K 4.0 3.9 3.6* 3.9  CL 93* 97 98 100  CO2 $Re'25 27 26 26  'Unh$ GLUCOSE 143* 223* 322* 273*  BUN 20 29* 34* 38*  CREATININE 1.31 1.12 1.18 1.09  CALCIUM 9.3 9.7 9.6 9.6   Liver Function Tests: No results found for this basename: AST, ALT, ALKPHOS, BILITOT, PROT, ALBUMIN,  in the last 168 hours No results found for this basename: LIPASE, AMYLASE,  in the last 168 hours No results found for this basename: AMMONIA,  in the last 168 hours CBC:  Recent Labs Lab 09/19/13 0700 09/20/13 0517 09/21/13 0355 09/22/13 0450  WBC 21.8* 21.5* 27.7* 24.4*  NEUTROABS 17.5*  --   --   --   HGB 13.8 13.8 12.6* 12.8*  HCT 41.3 41.8 38.6* 39.4  MCV 85.5 84.8 86.5 87.2  PLT 201 205 238 245   Cardiac Enzymes: No results found for this basename: CKTOTAL, CKMB, CKMBINDEX, TROPONINI,  in the last 168 hours BNP: BNP (last 3 results)  Recent Labs  09/19/13 0700  PROBNP 791.2*   CBG:  Recent Labs Lab 09/21/13 1214 09/21/13 1703 09/21/13 2155  09/22/13 0721 09/22/13 1146  GLUCAP 348* 288* 267* 237* 296*       Signed:  Lelon Frohlich  [facility] Pager: [phone] 09/22/2013, 3:03 PM

## 2013-09-22 NOTE — Care Management Note (Addendum)
    Page 1 of 1   09/22/2013     3:11:06 PM   CARE MANAGEMENT NOTE 09/22/2013  Patient:  [name], [name]   Account Number:  [id number]  Date Initiated:  09/22/2013  Documentation initiated by:  [facility]  Subjective/Objective Assessment:   68 Y/O M ADMITTED W/SOB.     Action/Plan:   FROM HOME.HAS PCP,PHARMACY.   Anticipated DC Date:  09/22/2013   Anticipated DC Plan:  HOME W HOME HEALTH SERVICES      DC Planning Services  CM consult      Choice offered to / List presented to:  C-1 Patient        HH arranged  HH-2 PT      [facility] agency  [facility]   Status of service:  Completed, signed off Medicare Important Message given?   (If response is "NO", the following Medicare IM given date fields will be blank) Date Medicare IM given:   Date Additional Medicare IM given:    Discharge Disposition:  HOME W HOME HEALTH SERVICES  Per UR Regulation:  Reviewed for med. necessity/level of care/duration of stay  If discussed at Long Length of Stay Meetings, dates discussed:    Comments:  09/22/13 Lyna Laningham RN,BSN NCM [phone] HHPT ORDER PLACED.[facility] KRISTEN AWARE OF HHPT ORDER. AWAITING HHPT ORDER.PCP-DR. SHEILA STALLINGS.MD NOTIFIED.  PT-HH.[facility] CHOSEN FOR HHPT.TC KRISTEN [facility] REP AWARE OF NEED FOR HHPT ORDER, & D/C.NO HOME 02 NEEDED OR ORDERED.

## 2013-09-25 LAB — CULTURE, BLOOD (ROUTINE X 2)
CULTURE: NO GROWTH
Culture: NO GROWTH

## 2013-09-29 ENCOUNTER — Other Ambulatory Visit: Payer: Self-pay | Admitting: Cardiology

## 2013-09-30 ENCOUNTER — Other Ambulatory Visit: Payer: Self-pay | Admitting: Family Medicine

## 2013-09-30 DIAGNOSIS — D72829 Elevated white blood cell count, unspecified: Secondary | ICD-10-CM

## 2013-10-02 ENCOUNTER — Other Ambulatory Visit: Payer: Self-pay | Admitting: Cardiology

## 2013-10-03 ENCOUNTER — Other Ambulatory Visit: Payer: Medicare Other

## 2013-10-03 ENCOUNTER — Ambulatory Visit (INDEPENDENT_AMBULATORY_CARE_PROVIDER_SITE_OTHER): Payer: Medicare Other | Admitting: Pharmacist

## 2013-10-03 DIAGNOSIS — Z5181 Encounter for therapeutic drug level monitoring: Secondary | ICD-10-CM

## 2013-10-03 DIAGNOSIS — I4891 Unspecified atrial fibrillation: Secondary | ICD-10-CM

## 2013-10-03 DIAGNOSIS — I480 Paroxysmal atrial fibrillation: Secondary | ICD-10-CM

## 2013-10-03 DIAGNOSIS — Z7901 Long term (current) use of anticoagulants: Secondary | ICD-10-CM

## 2013-10-03 LAB — POCT INR: INR: 3.5

## 2013-10-05 ENCOUNTER — Telehealth: Payer: Self-pay | Admitting: Hematology and Oncology

## 2013-10-05 NOTE — Telephone Encounter (Signed)
LEFT MESSAGE FOR PATIENT TO RETURN CALL TO SCHEDULE NEW PATIENT APPT.  °

## 2013-10-11 ENCOUNTER — Telehealth: Payer: Self-pay | Admitting: Hematology and Oncology

## 2013-10-11 NOTE — Telephone Encounter (Signed)
S/W PATIENT AND GAVE NEW PATIENT APPT FOR 04/10 @ 1 W/DR. GORSUCH.  REFERRING DR. Victorino Dike BROWN DX- SPLENOMEGALY; IDA WELCOME PACKET MAILED.

## 2013-10-11 NOTE — Telephone Encounter (Signed)
C/D 10/11/13 for appt. 10/21/13

## 2013-10-21 ENCOUNTER — Ambulatory Visit (HOSPITAL_BASED_OUTPATIENT_CLINIC_OR_DEPARTMENT_OTHER): Payer: Medicare Other

## 2013-10-21 ENCOUNTER — Encounter: Payer: Self-pay | Admitting: Hematology and Oncology

## 2013-10-21 ENCOUNTER — Telehealth: Payer: Self-pay | Admitting: Hematology and Oncology

## 2013-10-21 ENCOUNTER — Ambulatory Visit (HOSPITAL_BASED_OUTPATIENT_CLINIC_OR_DEPARTMENT_OTHER): Payer: Medicare Other | Admitting: Hematology and Oncology

## 2013-10-21 VITALS — BP 148/54 | HR 74 | Temp 98.1°F | Resp 20 | Ht >= 80 in | Wt 361.0 lb

## 2013-10-21 DIAGNOSIS — D649 Anemia, unspecified: Secondary | ICD-10-CM

## 2013-10-21 DIAGNOSIS — D72829 Elevated white blood cell count, unspecified: Secondary | ICD-10-CM

## 2013-10-21 DIAGNOSIS — M069 Rheumatoid arthritis, unspecified: Secondary | ICD-10-CM

## 2013-10-21 LAB — CBC WITH DIFFERENTIAL/PLATELET
BASO%: 0.5 % (ref 0.0–2.0)
Basophils Absolute: 0.1 10*3/uL (ref 0.0–0.1)
EOS%: 2.8 % (ref 0.0–7.0)
Eosinophils Absolute: 0.3 10*3/uL (ref 0.0–0.5)
HCT: 39 % (ref 38.4–49.9)
HGB: 12.7 g/dL — ABNORMAL LOW (ref 13.0–17.1)
LYMPH%: 24.6 % (ref 14.0–49.0)
MCH: 27.7 pg (ref 27.2–33.4)
MCHC: 32.7 g/dL (ref 32.0–36.0)
MCV: 84.8 fL (ref 79.3–98.0)
MONO#: 1.5 10*3/uL — ABNORMAL HIGH (ref 0.1–0.9)
MONO%: 12 % (ref 0.0–14.0)
NEUT#: 7.3 10*3/uL — ABNORMAL HIGH (ref 1.5–6.5)
NEUT%: 60.1 % (ref 39.0–75.0)
Platelets: 240 10*3/uL (ref 140–400)
RBC: 4.59 10*6/uL (ref 4.20–5.82)
RDW: 15.7 % — AB (ref 11.0–14.6)
WBC: 12.1 10*3/uL — ABNORMAL HIGH (ref 4.0–10.3)
lymph#: 3 10*3/uL (ref 0.9–3.3)

## 2013-10-21 LAB — SEDIMENTATION RATE: Sed Rate: 40 mm/hr — ABNORMAL HIGH (ref 0–16)

## 2013-10-21 LAB — MORPHOLOGY: PLT EST: ADEQUATE

## 2013-10-21 LAB — C-REACTIVE PROTEIN: CRP: 1.2 mg/dL — AB (ref <0.60)

## 2013-10-21 NOTE — Telephone Encounter (Signed)
Gave pt AVS and sent to labs today

## 2013-10-21 NOTE — Progress Notes (Signed)
[facility] CONSULT NOTE  Patient Care Team: Fraser Din, PA-C as PCP - General (Physician Assistant) Peter M Sw[place], MD (Cardiology)  CHIEF COMPLAINTS/PURPOSE OF CONSULTATION:  New onset leukocytosis  HISTORY OF PRESENTING ILLNESS:  [name] 68 y.o. male is here because of elevated WBC.  He was found to have abnormal CBC from pulse hospital followup. The patient was admitted to the hospital with diagnosis of influenza and COPD. He was placed on prednisone therapy. He went to see his primary care provider or within the week after dismissal on 09/29/2013. His blood count was noted to be abnormal with total white blood cell count of 24.3. In April 2013, his total white count was 7.8.  The patient also had background history of rheumatoid arthritis. There was no acute flare of arthritis. His upper respiratory tract infection symptoms are almost back to baseline. He had no prior history or diagnosis of cancer. His age appropriate screening programs are up-to-date.  MEDICAL HISTORY:  Past Medical History  Diagnosis Date  . PAF (paroxysmal atrial fibrillation)   . Chronic anticoagulation   . HTN (hypertension)   . Chronic chest pain   . Obesity   . Chronic back pain   . Emphysema   . Chronic venous insufficiency   . Pneumothorax   . Rheumatoid arthritis(714.0)     SURGICAL HISTORY: Past Surgical History  Procedure Laterality Date  . Cardiac catheterization  1998    NORMAL  . Hernia repair    . Vein ligation and stripping    . Pleural scarification    . Ablation      SOCIAL HISTORY: History   Social History  . Marital Status: Married    Spouse Name: N/A    Number of Children: 3  . Years of Education: N/A   Occupational History  . forklift operator [name]   Social History Main Topics  . Smoking status: Former Smoker -- 2.00 packs/day for 30 years    Types: Cigarettes    Quit date: 03/14/1996  . Smokeless tobacco: Never Used  . Alcohol Use: Yes      Comment: one beer per month  . Drug Use: No  . Sexual Activity: No   Other Topics Concern  . Not on file   Social History Narrative  . No narrative on file    FAMILY HISTORY: Family History  Problem Relation Age of Onset  . Other Father     CEREBRAL THROMBOSIS  . Cancer Mother     bone cancer  . Heart failure Brother 50  . Coronary artery disease Brother 40    PTCA  . Rheum arthritis Mother   . Rheum arthritis Brother   . Rheum arthritis Brother     ALLERGIES:  has No Known Allergies.  MEDICATIONS:  Current Outpatient Prescriptions  Medication Sig Dispense Refill  . acetaminophen (TYLENOL) 325 MG tablet Take 650 mg by mouth every 6 (six) hours as needed.      Marland Kitchen albuterol (PROVENTIL) (2.5 MG/3ML) 0.083% nebulizer solution Take 3 mLs (2.5 mg total) by nebulization every 4 (four) hours as needed for shortness of breath.  75 mL  12  . amiodarone (PACERONE) 200 MG tablet TAKE 1/2 TABLET BY MOUTH DAILY  15 tablet  1  . atenolol (TENORMIN) 50 MG tablet Take 50 mg by mouth 2 (two) times daily.      Marland Kitchen diltiazem (CARDIZEM CD) 180 MG 24 hr capsule Take 1 capsule (180 mg total) by mouth daily.  30 capsule  4  . furosemide (LASIX) 40 MG tablet Take 40 mg by mouth daily.      Marland Kitchen guaiFENesin (MUCINEX) 600 MG 12 hr tablet Take 600 mg by mouth 4 (four) times daily.      . hydrochlorothiazide (HYDRODIURIL) 25 MG tablet Take 25 mg by mouth daily.      Marland Kitchen HYDROcodone-acetaminophen (NORCO/VICODIN) 5-325 MG per tablet Take 1 tablet by mouth.      Marland Kitchen ipratropium-albuterol (DUONEB) 0.5-2.5 (3) MG/3ML SOLN Take 3 mLs by nebulization 4 (four) times daily.  360 mL  11  . omeprazole (PRILOSEC) 20 MG capsule Take 1 capsule (20 mg total) by mouth daily.  30 capsule  11  . Potassium Chloride ER 20 MEQ TBCR TAKE 1 TABLET (20 MEQ TOTAL) BY MOUTH 2 (TWO) TIMES DAILY.  60 tablet  1  . spironolactone (ALDACTONE) 25 MG tablet TAKE 1 TABLET (25 MG TOTAL) BY MOUTH DAILY.  30 tablet  1  . warfarin (COUMADIN) 5  MG tablet Take 5 mg by mouth daily. Takes  2.5mg  on Monday, Wednesday, and Friday. Takes 5mg  on Tuesday, Thursday, Saturday, and Sunday.       No current facility-administered medications for this visit.    REVIEW OF SYSTEMS:   Constitutional: Denies fevers, chills or abnormal night sweats Eyes: Denies blurriness of vision, double vision or watery eyes Ears, nose, mouth, throat, and face: Denies mucositis or sore throat Respiratory: Denies cough, dyspnea or wheezes Cardiovascular: Denies palpitation, chest discomfort or lower extremity swelling Gastrointestinal:  Denies nausea, heartburn or change in bowel habits Skin: Denies abnormal skin rashes Lymphatics: Denies new lymphadenopathy or easy bruising Neurological:Denies numbness, tingling or new weaknesses Behavioral/Psych: Mood is stable, no new changes  All other systems were reviewed with the patient and are negative.  PHYSICAL EXAMINATION: ECOG PERFORMANCE STATUS: 0 - Asymptomatic  Filed Vitals:   10/21/13 1321  BP: 148/54  Pulse: 74  Temp: 98.1 F (36.7 C)  Resp: 20   Filed Weights   10/21/13 1321  Weight: 361 lb (163.749 kg)    GENERAL:alert, no distress and comfortable. He is morbidly obese SKIN: skin color, texture, turgor are normal, no rashes or significant lesions EYES: normal, conjunctiva are pink and non-injected, sclera clear OROPHARYNX:no exudate, no erythema and lips, buccal mucosa, and tongue normal  NECK: supple, thyroid normal size, non-tender, without nodularity LYMPH:  no palpable lymphadenopathy in the cervical, axillary or inguinal LUNGS: clear to auscultation and percussion with normal breathing effort HEART: regular rate & rhythm and no murmurs and no lower extremity edema ABDOMEN:abdomen soft, non-tender and normal bowel sounds Musculoskeletal:no cyanosis of digits and no clubbing  PSYCH: alert & oriented x 3 with fluent speech NEURO: no focal motor/sensory deficits  LABORATORY DATA:  I have  reviewed the data as listed Lab Results  Component Value Date   WBC 12.1* 10/21/2013   HGB 12.7* 10/21/2013   HCT 39.0 10/21/2013   MCV 84.8 10/21/2013   PLT 240 10/21/2013   ASSESSMENT & PLAN #1 Leukocytosis #2 recent COPD exacerbation I drew a stat CBC my office. His white blood cell count has dropped to near normal. I informed the patient is is likely reactive to recent infection and recent corticosteroid therapy. I reassured the patient and recommended he see his primary care provider with repeat blood count in a few months. If it remains persistently elevated I will be glad to see him back #3 anemia This is likely anemia of chronic disease. The patient denies  recent history of bleeding such as epistaxis, hematuria or hematochezia. He is asymptomatic from the anemia. We will observe for now.

## 2013-11-02 ENCOUNTER — Ambulatory Visit (INDEPENDENT_AMBULATORY_CARE_PROVIDER_SITE_OTHER): Payer: Medicare Other | Admitting: Pharmacist

## 2013-11-02 ENCOUNTER — Ambulatory Visit (INDEPENDENT_AMBULATORY_CARE_PROVIDER_SITE_OTHER): Payer: Medicare Other | Admitting: Cardiology

## 2013-11-02 ENCOUNTER — Encounter: Payer: Self-pay | Admitting: Cardiology

## 2013-11-02 VITALS — BP 148/80 | HR 64 | Ht >= 80 in | Wt 352.0 lb

## 2013-11-02 DIAGNOSIS — J449 Chronic obstructive pulmonary disease, unspecified: Secondary | ICD-10-CM

## 2013-11-02 DIAGNOSIS — I480 Paroxysmal atrial fibrillation: Secondary | ICD-10-CM

## 2013-11-02 DIAGNOSIS — Z7901 Long term (current) use of anticoagulants: Secondary | ICD-10-CM

## 2013-11-02 DIAGNOSIS — Z79899 Other long term (current) drug therapy: Secondary | ICD-10-CM

## 2013-11-02 DIAGNOSIS — I4891 Unspecified atrial fibrillation: Secondary | ICD-10-CM

## 2013-11-02 DIAGNOSIS — K219 Gastro-esophageal reflux disease without esophagitis: Secondary | ICD-10-CM

## 2013-11-02 DIAGNOSIS — Z5181 Encounter for therapeutic drug level monitoring: Secondary | ICD-10-CM

## 2013-11-02 DIAGNOSIS — R079 Chest pain, unspecified: Secondary | ICD-10-CM

## 2013-11-02 DIAGNOSIS — G8929 Other chronic pain: Secondary | ICD-10-CM

## 2013-11-02 LAB — POCT INR: INR: 3.1

## 2013-11-02 MED ORDER — OMEPRAZOLE 20 MG PO CPDR: 20.0000 mg | DELAYED_RELEASE_CAPSULE | Freq: Every day | ORAL | Status: DC

## 2013-11-02 MED ORDER — AMIODARONE HCL 200 MG PO TABS: ORAL_TABLET | ORAL | Status: DC

## 2013-11-02 MED ORDER — DILTIAZEM HCL ER COATED BEADS 180 MG PO CP24: 180.0000 mg | ORAL_CAPSULE | Freq: Every day | ORAL | Status: DC

## 2013-11-02 MED ORDER — HYDROCHLOROTHIAZIDE 25 MG PO TABS: 25.0000 mg | ORAL_TABLET | Freq: Every day | ORAL | Status: DC

## 2013-11-02 MED ORDER — FUROSEMIDE 40 MG PO TABS: 40.0000 mg | ORAL_TABLET | Freq: Every day | ORAL | Status: DC

## 2013-11-02 MED ORDER — ATENOLOL 50 MG PO TABS: 50.0000 mg | ORAL_TABLET | Freq: Two times a day (BID) | ORAL | Status: DC

## 2013-11-02 MED ORDER — POTASSIUM CHLORIDE ER 20 MEQ PO TBCR: EXTENDED_RELEASE_TABLET | ORAL | Status: DC

## 2013-11-02 MED ORDER — SPIRONOLACTONE 25 MG PO TABS: ORAL_TABLET | ORAL | Status: DC

## 2013-11-02 NOTE — Progress Notes (Signed)
History of Present Illness: [name] is seen today for follow up. He has a history of recurrent paroxysmal atrial fibrillation. He has been on chronic amiodarone therapy since September of 2011. He states this has worked very well for him and he has had very infrequent episodes of arrhythmia. This now is occuring twice a week and lasts less than 10 min. Due to a error in his medication reconciliation he has only been taking 100 mg of amiodarone for the last 3 weeks but notices no change in arrhythmia.   He was admitted to the hospital in March with the flu and acute COPD exacerbation. He continues to have some difficulty breathing. He is followed by Dr. Delton Coombes. Recently he experienced some low back problems. He lay on a heating pad for much of the day and since then has noted bruising on his back.  Current Outpatient Prescriptions on File Prior to Visit  Medication Sig Dispense Refill  . albuterol (PROVENTIL) (2.5 MG/3ML) 0.083% nebulizer solution Take 3 mLs (2.5 mg total) by nebulization every 4 (four) hours as needed for shortness of breath.  75 mL  12  . guaiFENesin (MUCINEX) 600 MG 12 hr tablet Take 600 mg by mouth 4 (four) times daily.      Marland Kitchen HYDROcodone-acetaminophen (NORCO/VICODIN) 5-325 MG per tablet Take 1 tablet by mouth.      Marland Kitchen ipratropium-albuterol (DUONEB) 0.5-2.5 (3) MG/3ML SOLN Take 3 mLs by nebulization 4 (four) times daily.  360 mL  11  . warfarin (COUMADIN) 5 MG tablet Take 5 mg by mouth daily. Takes  2.5mg  on Monday, Wednesday, and Friday. Takes 5mg  on Tuesday, Thursday, Saturday, and Sunday.       No current facility-administered medications on file prior to visit.    No Known Allergies  Past Medical History  Diagnosis Date  . PAF (paroxysmal atrial fibrillation)   . Chronic anticoagulation   . HTN (hypertension)   . Chronic chest pain   . Obesity   . Chronic back pain   . Emphysema   . Chronic venous insufficiency   . Pneumothorax   . Rheumatoid arthritis(714.0)      Past Surgical History  Procedure Laterality Date  . Cardiac catheterization  1998    NORMAL  . Hernia repair    . Vein ligation and stripping    . Pleural scarification    . Ablation      History  Smoking status  . Former Smoker -- 2.00 packs/day for 30 years  . Types: Cigarettes  . Quit date: 03/14/1996  Smokeless tobacco  . Never Used    History  Alcohol Use  . Yes    Comment: one beer per month    Family History  Problem Relation Age of Onset  . Other Father     CEREBRAL THROMBOSIS  . Cancer Mother     bone cancer  . Heart failure Brother 50  . Coronary artery disease Brother 67    PTCA  . Rheum arthritis Mother   . Rheum arthritis Brother   . Rheum arthritis Brother     Review of Systems: As noted in history of present illness. All other systems were reviewed and are negative.  Physical Exam: BP 148/80  Pulse 64  Ht 6\' 8"  (2.032 m)  Wt 352 lb (159.666 kg)  BMI 38.67 kg/m2 He is a pleasant, obese white male. He is in no acute distress. HEENT is unremarkable.Neck is supple. No masses.  Lungs reveal scattered expiratory wheezes.. Cardiac exam shows  a regular rate and rhythm. No gallop or murmur. Abdomen is obese. There are no masses or tenderness to palpation. Extremities are full. He has chronic venous stasis disease with chronic brawny edema. Gait and ROM are intact. He has no gross neurologic deficits. Back reveals dark mottling over the lower back.  Laboratory data:   ECG on March 9 demonstrates normal sinus rhythm with first-degree AV block.  He has a left axis deviation.  Assessment / Plan: 1. Paroxysmal atrial fibrillation. His is well controlled on amiodarone. We will continue amiodarone 100 mg daily and atenolol. I'll followup again in 6 months.  2. Anticoagulation. Therapeutic on Coumadin with an INR 3.1.  3. Morbid obesity with sleep apnea. I encouraged continue weight loss.  4. Chronic venous insufficiency with severe chronic edema.  Continue diuretic therapy with Lasix, HCTZ, and Aldactone. Continue potassium supplementation.   5. COPD/emphysema. Exacerbation in March with the flu. Doing better now.

## 2013-11-02 NOTE — Patient Instructions (Signed)
Continue your current therapy  I will see you in 6 months.   

## 2013-11-27 ENCOUNTER — Other Ambulatory Visit: Payer: Self-pay | Admitting: Cardiology

## 2013-12-07 ENCOUNTER — Encounter: Payer: Self-pay | Admitting: Emergency Medicine

## 2013-12-07 ENCOUNTER — Ambulatory Visit (INDEPENDENT_AMBULATORY_CARE_PROVIDER_SITE_OTHER): Payer: Medicare Other | Admitting: Emergency Medicine

## 2013-12-07 VITALS — BP 142/64 | HR 69 | Temp 97.8°F | Ht >= 80 in | Wt 354.6 lb

## 2013-12-07 DIAGNOSIS — J449 Chronic obstructive pulmonary disease, unspecified: Secondary | ICD-10-CM

## 2013-12-07 NOTE — Progress Notes (Signed)
Subjective:    Patient ID: [name], male    DOB: 17-Sep-1945, 68 y.o.   MRN: [id number]   HPI 68 yo man, hx tobacco, Hx RA, followed by Dr Swaziland for HTN, A Fib. OSA on CPAP.  He underwent CPAP titration since last time - recommended BiPAP 18/14 vs CPAP 18 (currently on 14). He wants to change from Spiriva to scheduled Nebs.   ROV 11/10/11 -- OSA on CPAP, COPD, Hx RA. Last time changed spiriva to duonebs tid. He returns c/o exertional sob and cough that started around a month ago. Last 2 -3 days worse, nasal gtt and congestion. He has used SABA more frequently. Has gained 10 lbs since last visit. He has gotten use to the CPAP at higher pressure (18). He stopped nexium a month ago! Replaced with prilosec + pepcid.   ROV 03/11/12 -- OSA on CPAP, COPD, Hx RA. He is on DuoNebs tid. Unable to get nexium, needed prior-auth which is pending. Has been wearing CPAP. He tells me that he is more dyspneic compared with last visit, significant decrease in his exercise tolerance. He had to stop Nexium again about 10 days ago. We changed to nebs a year ago due to cost, and he has never felt as well since that change. He has put another 10 lbs since April. He hears wheezing w exertion. He coughs frequently, productive but he hasn't seen it. No abx or pred since last time.   ROV 07/09/12 -- OSA on CPAP, COPD, Hx RA.  Last time we started Spiriva, changed duonebs to albuterol for prn use. Also restarted nexium. He returns reporting Since last time his dyspnea is worse - he can't recover from a walk as quickly. Started taking Spiriva sporadically about 2 weeks ago. Also having exertional CP, last myoview was April 2012 (no intervention needed).  Having wheezing, cough that is non-prod. Increased B LE edema over lat 3 mon. Has gained 8 lbs since last visit.   ROV 11/04/12 -- OSA reliable on CPAP 18, COPD, Hx RA. Also followed by Dr Swaziland for A Fib, rate control, volume status. Last time we stopped spiriva due to cost,  restarted duonebs qid > he says that he is only getting the albuterol component. He tells me he was recently rx with indomethacin for gout, caused epistaxis, had to have his coumadin adjusted.  He is having exertional SOB, difficulty taking a breath in.  Freq cough, ? productive  Christoper Allegra is DME > says he needs new nasal pillows, current one in poor repair. Also needs atrovent added to his nebs.   ROV 02/12/13 -- OSA reliable on CPAP 18, COPD, Hx RA, A Fib. Was seen by Dr Shelle Iron in 7/14 for an AE-COPD. Returns today improved after pred and doxy. He had a recent fall, ? Why. Didn't lose consciousness. Continues to do nebs on a schedule. He needs a new CPAP mask, but [employer] doesn't have his current PSG data.   ROV 06/30/13 -- OSA on CPAP 18, COPD, Hx RA, A Fib. He is having more trouble with overall breathing, not really sure this is a flare. He is able to exert less, dyspneic with just walking. Lots of cough and phlegm. Using duonebs qid. Saw [facility] at [place].   ROV 08/02/13 -- OSA on CPAP 18, COPD, Hx RA, A Fib. The prednisone temporarily helped him but he is now having DOE and SOB even when sitting still. Taking duonebs on a schedule. He took fluticasone nasal spray, has  refills.    ROV 09/05/13 -- OSA on CPAP 18, COPD, Hx RA, A Fib. Last time I treated for an AE > he has had several apparent AE in last few months. Also added Pulmicort nebs to his DuoNebs. He returns today feeling better. He is using the budesonide bid. He has been off nexium x 1 year. He would be willing to start back on omeprazole.   ROV 12/07/13 -- OSA on CPAP 18, COPD, Hx RA, A Fib. He was admitted with the Flu in March.  Has slowly gotten better but still very limited w regard to exertion. Interested in changing back to Spiriva or an alternative.    Objective:   Physical Exam Filed Vitals:   12/07/13 1628  BP: 142/64  Pulse: 69  Temp: 97.8 F (36.6 C)  TempSrc: Oral  Height: 6\' 8"  (2.032 m)  Weight: 354 lb 9.6 oz  (160.846 kg)  SpO2: 94%   Gen: Pleasant, obese, in no distress,  normal affect  ENT: No lesions,  mouth clear,  oropharynx clear, no postnasal drip  Neck: No JVD, no TMG, no carotid bruits  Lungs: No use of accessory muscles, B wheezes on forced exp only, otherwise clear  Cardiovascular: RRR, systolic M,  Musculoskeletal: No deformities, no cyanosis or clubbing  Neuro: alert, non focal  Skin: Warm, no lesions or rashes   Assessment & Plan:  COPD (chronic obstructive pulmonary disease) We will trial Anoro and see if he benefits. If so then we will order this and see how much it will cost. If affordable then he will continue. If it doesn't work or if too expensive then will consider Spiriva instead which we know he has tolerated.

## 2013-12-07 NOTE — Patient Instructions (Signed)
We will start Anoro once a day for the next month to see if you benefit.  Call to let us know if you benefit and if we should write a script for the Anoro to see how much it costs.   If you do not benefit or if the Anoro is too expensive then we will write for Spiriva instead.  Continue your CPAP at night Follow with Dr Delton Coombes in 3 months or sooner if you have any problems.

## 2013-12-07 NOTE — Assessment & Plan Note (Signed)
We will trial Anoro and see if he benefits. If so then we will order this and see how much it will cost. If affordable then he will continue. If it doesn't work or if too expensive then will consider Spiriva instead which we know he has tolerated.

## 2013-12-08 ENCOUNTER — Ambulatory Visit (INDEPENDENT_AMBULATORY_CARE_PROVIDER_SITE_OTHER): Payer: Medicare Other | Admitting: Pharmacist

## 2013-12-08 DIAGNOSIS — Z5181 Encounter for therapeutic drug level monitoring: Secondary | ICD-10-CM

## 2013-12-08 DIAGNOSIS — I4891 Unspecified atrial fibrillation: Secondary | ICD-10-CM

## 2013-12-08 DIAGNOSIS — I480 Paroxysmal atrial fibrillation: Secondary | ICD-10-CM

## 2013-12-08 DIAGNOSIS — Z7901 Long term (current) use of anticoagulants: Secondary | ICD-10-CM

## 2013-12-08 LAB — POCT INR: INR: 3.1

## 2013-12-11 ENCOUNTER — Other Ambulatory Visit: Payer: Self-pay | Admitting: Cardiology

## 2013-12-29 ENCOUNTER — Telehealth: Payer: Self-pay | Admitting: Emergency Medicine

## 2013-12-29 MED ORDER — UMECLIDINIUM-VILANTEROL 62.5-25 MCG/INH IN AEPB: 1.0000 | INHALATION_SPRAY | Freq: Every day | RESPIRATORY_TRACT | Status: DC

## 2013-12-29 NOTE — Telephone Encounter (Signed)
Spoke with the pt  He is requesting rx for Anorno  He states that his breathing is 100 % better since he started on med Rx was sent to pharm  Nothing further needed per pt

## 2014-01-05 ENCOUNTER — Other Ambulatory Visit: Payer: Self-pay | Admitting: Cardiology

## 2014-01-06 NOTE — Telephone Encounter (Signed)
Called and spoke to Franklin at [facility] and pt still has 1 more refill left.

## 2014-01-12 ENCOUNTER — Ambulatory Visit (INDEPENDENT_AMBULATORY_CARE_PROVIDER_SITE_OTHER): Payer: Medicare Other | Admitting: *Deleted

## 2014-01-12 DIAGNOSIS — I480 Paroxysmal atrial fibrillation: Secondary | ICD-10-CM

## 2014-01-12 DIAGNOSIS — Z5181 Encounter for therapeutic drug level monitoring: Secondary | ICD-10-CM

## 2014-01-12 DIAGNOSIS — Z7901 Long term (current) use of anticoagulants: Secondary | ICD-10-CM

## 2014-01-12 DIAGNOSIS — I4891 Unspecified atrial fibrillation: Secondary | ICD-10-CM

## 2014-01-12 LAB — POCT INR: INR: 2

## 2014-02-01 ENCOUNTER — Telehealth: Payer: Self-pay | Admitting: Cardiology

## 2014-02-01 NOTE — Telephone Encounter (Signed)
New Message  Pt called reports that his heart is out of rythem..  Made appt for 02/10/2014 with PA, B. Simmons. Pt requests a call back to discuss.

## 2014-02-01 NOTE — Telephone Encounter (Signed)
Returned call to patient no answer.Left message on personal voice mail spoke to Dr.[name] he advised to increase Amiodarone to 200 mg 1 tablet every day.Advised to keep appointment with Robbie Lis PA 02/10/14 at 8:00 am.

## 2014-02-10 ENCOUNTER — Ambulatory Visit (INDEPENDENT_AMBULATORY_CARE_PROVIDER_SITE_OTHER): Payer: Medicare Other | Admitting: Cardiology

## 2014-02-10 ENCOUNTER — Encounter: Payer: Self-pay | Admitting: Cardiology

## 2014-02-10 VITALS — BP 122/66 | HR 57 | Ht >= 80 in | Wt 361.0 lb

## 2014-02-10 DIAGNOSIS — R079 Chest pain, unspecified: Secondary | ICD-10-CM

## 2014-02-10 MED ORDER — AMIODARONE HCL 200 MG PO TABS: 200.0000 mg | ORAL_TABLET | Freq: Every day | ORAL | Status: DC

## 2014-02-10 NOTE — Patient Instructions (Signed)
Your physician has requested that you have a lexiscan myoview. For further information please visit [email or web address] Please follow instruction sheet, as given.  Your physician recommends that you schedule a follow-up appointment in: End of October

## 2014-02-10 NOTE — Progress Notes (Signed)
Patient ID: [name], male   DOB: 05-18-1946, 68 y.o.   MRN: [id number]    02/10/2014 [name]   08-07-1945  [id number]  Primary Physicia Mand, Ricky Revel, PA-C Primary Cardiologist: Dr. Swaziland  HPI: The patient is a 68 year old white male, followed by Dr. Swaziland with a history of HTN, COPD and recurrent paroxysmal atrial fibrillation. He has been on chronic Amiodarone therapy since September of 2011. He is also on atenolol and Cardizem for rate control. He states that this has worked well for him he has had very frequent episodes of arrhythmia. Previously, he had been maintained on 200 mg of amiodarone daily. However, he was hospitalized in March of this year with flu and acute COPD exacerbation. There was an error in his medication reconciliation and he was discharged on 100 mg daily. However, during that time he noted no change in arrhythmia. He was seen by Dr. Swaziland for post hospital followup on 11/02/2013. At that time, he was without complaints and an EKG demonstrated normal sinus rhythm. As he had been maintaining normal sinus rhythm on a lower dose of amiodarone, Dr. Swaziland instructed for him to continue 100 mg daily. It should also be noted that he is on chronic oral anticoagulation with Coumadin. His INR is followed in clinic.   Several days ago, he called the office stating that he felt like his heart was out of rhythm. This continued for 3 days, which is abnormal than his typical episodes which only last roughly 15 minutes when they do occur.  He was instructed by Dr. Swaziland to increase his amiodarone from 100 mg back to 200 mg daily. He ports to clinic today for followup.  He tells me that since increasing his amiodarone back to 200 mg daily, his arrhythmia resolved and he denies any further recurrence of palpitations since that time. His EKG today demonstrates normal sinus rhythm. Ventricular rate is 57 beats per minute. He does admit to recent episodes of chest pain with mixed  typical and atypical features. He notes substernal chest pain radiating to the left anterior shoulder that occurs at rest. It is described as a sharp pain and it is not worsened by exertion. This discomfort lasts about 10-15 minutes before resolving spontaneously. He denies any other associated symptoms during these episodes including no dyspnea, nausea, vomiting, diaphoresis, syncope/near-syncope.   Current Outpatient Prescriptions  Medication Sig Dispense Refill  . albuterol (PROVENTIL HFA;VENTOLIN HFA) 108 (90 BASE) MCG/ACT inhaler Inhale 1-2 puffs into the lungs every 6 (six) hours as needed for wheezing or shortness of breath.      Marland Kitchen albuterol (PROVENTIL) (2.5 MG/3ML) 0.083% nebulizer solution Take 3 mLs (2.5 mg total) by nebulization every 4 (four) hours as needed for shortness of breath.  75 mL  12  . amiodarone (PACERONE) 200 MG tablet Take 1 tablet (200 mg total) by mouth daily.  30 tablet  6  . atenolol (TENORMIN) 50 MG tablet Take 1 tablet (50 mg total) by mouth 2 (two) times daily.  180 tablet  3  . diltiazem (CARDIZEM CD) 180 MG 24 hr capsule Take 1 capsule (180 mg total) by mouth daily.  90 capsule  3  . furosemide (LASIX) 40 MG tablet Take 1 tablet (40 mg total) by mouth daily.  90 tablet  3  . guaiFENesin (MUCINEX) 600 MG 12 hr tablet Take 600 mg by mouth 4 (four) times daily.      . hydrochlorothiazide (HYDRODIURIL) 25 MG tablet Take 1 tablet (  25 mg total) by mouth daily.  90 tablet  3  . HYDROcodone-acetaminophen (NORCO/VICODIN) 5-325 MG per tablet Take 1 tablet by mouth.      Marland Kitchen ipratropium-albuterol (DUONEB) 0.5-2.5 (3) MG/3ML SOLN Take 3 mLs by nebulization 4 (four) times daily.  360 mL  11  . omeprazole (PRILOSEC) 20 MG capsule Take 1 capsule (20 mg total) by mouth daily.  90 capsule  3  . Potassium Chloride ER 20 MEQ TBCR TAKE 1 TABLET (20 MEQ TOTAL) BY MOUTH 2 (TWO) TIMES DAILY.  60 tablet  3  . spironolactone (ALDACTONE) 25 MG tablet Take 1 tablet ( 25 mg ) daily  90 tablet  3   . Umeclidinium-Vilanterol 62.5-25 MCG/INH AEPB Inhale 1 puff into the lungs daily.  60 each  5  . warfarin (COUMADIN) 5 MG tablet 1 tablet every day except 1/2 tablet on Monday, Wednesday and Friday or as directed by coumadin clinic  30 tablet  3   No current facility-administered medications for this visit.    No Known Allergies  History   Social History  . Marital Status: Married    Spouse Name: N/A    Number of Children: 3  . Years of Education: N/A   Occupational History  . forklift operator [name]   Social History Main Topics  . Smoking status: Former Smoker -- 2.00 packs/day for 30 years    Types: Cigarettes    Quit date: 03/14/1996  . Smokeless tobacco: Never Used  . Alcohol Use: Yes     Comment: one beer per month  . Drug Use: No  . Sexual Activity: No   Other Topics Concern  . Not on file   Social History Narrative  . No narrative on file     Review of Systems: General: negative for chills, fever, night sweats or weight changes.  Cardiovascular: negative for chest pain, dyspnea on exertion, edema, orthopnea, palpitations, paroxysmal nocturnal dyspnea or shortness of breath Dermatological: negative for rash Respiratory: negative for cough or wheezing Urologic: negative for hematuria Abdominal: negative for nausea, vomiting, diarrhea, bright red blood per rectum, melena, or hematemesis Neurologic: negative for visual changes, syncope, or dizziness All other systems reviewed and are otherwise negative except as noted above.    There were no vitals taken for this visit.  General appearance: alert, cooperative and no distress Neck: no carotid bruit and no JVD Lungs: clear to auscultation bilaterally Heart: regular rate and rhythm, S1, S2 normal, no murmur, click, rub or gallop Extremities: chronic 1+ edema bilaterally Pulses: 2+ and symmetric Skin: warm and dry Neurologic: Grossly normal  EKG NSR with first degree AVB 57 bpm  ASSESSMENT AND PLAN:    1. Paroxysmal atrial fibrillation: He is back in normal sinus rhythm after increase of his amiodarone back to 200 mg daily. We'll continue him at this dose along with his atenolol and Cardizem. His heart rate and blood pressure both stable.  2. Chronic oral anticoagulation: This is followed in the Coumadin clinic. His was last checked 01/12/2014. His INR was therapeutic at 2.0. He was instructed to return back in 5 weeks.   3. Chest pain: He has mixed typical/atypical features. His EKG is without any ischemic abnormalities. His last nuclear study was in 2012 as was normal. I recommended that we do a repeat evaluation with another nuclear stress test for risk stratification. Due to his COPD, he feels that he would get short of breath quickly on the treadmill. His last nuclear stress test was a  Lexiscan and he reports that he tolerated this well. He has no active wheezing on physical exam today. Will order for him to do another Lexiscan.     PLAN  Continue current medical regimen for PAF. Will pain a nuclear stress test to risk stratify, in the setting of recent typical/atypical chest pain. If abnormal, we will arrange for him to followup in the next one to 2 weeks to discuss cardiac catheterization. However if this study is normal, he will followup with Dr. Swaziland as directed, in another 3-4 months.  Raysa Bosak, BRITTAINYPA-C 02/10/2014 8:09 AM

## 2014-02-19 ENCOUNTER — Other Ambulatory Visit: Payer: Self-pay | Admitting: Cardiology

## 2014-02-22 ENCOUNTER — Encounter (HOSPITAL_COMMUNITY): Payer: Medicare Other

## 2014-02-24 ENCOUNTER — Ambulatory Visit (INDEPENDENT_AMBULATORY_CARE_PROVIDER_SITE_OTHER): Payer: Medicare Other | Admitting: Pharmacist

## 2014-02-24 DIAGNOSIS — I480 Paroxysmal atrial fibrillation: Secondary | ICD-10-CM

## 2014-02-24 DIAGNOSIS — I4891 Unspecified atrial fibrillation: Secondary | ICD-10-CM

## 2014-02-24 DIAGNOSIS — Z5181 Encounter for therapeutic drug level monitoring: Secondary | ICD-10-CM

## 2014-02-24 DIAGNOSIS — Z7901 Long term (current) use of anticoagulants: Secondary | ICD-10-CM

## 2014-02-24 LAB — POCT INR: INR: 1.9

## 2014-03-31 ENCOUNTER — Ambulatory Visit (INDEPENDENT_AMBULATORY_CARE_PROVIDER_SITE_OTHER): Payer: Medicare Other

## 2014-03-31 DIAGNOSIS — Z5181 Encounter for therapeutic drug level monitoring: Secondary | ICD-10-CM

## 2014-03-31 DIAGNOSIS — Z7901 Long term (current) use of anticoagulants: Secondary | ICD-10-CM

## 2014-03-31 DIAGNOSIS — I4891 Unspecified atrial fibrillation: Secondary | ICD-10-CM

## 2014-03-31 DIAGNOSIS — I480 Paroxysmal atrial fibrillation: Secondary | ICD-10-CM

## 2014-03-31 LAB — POCT INR: INR: 2.2

## 2014-04-07 ENCOUNTER — Ambulatory Visit (INDEPENDENT_AMBULATORY_CARE_PROVIDER_SITE_OTHER): Payer: Medicare Other | Admitting: Emergency Medicine

## 2014-04-07 ENCOUNTER — Encounter: Payer: Self-pay | Admitting: Emergency Medicine

## 2014-04-07 VITALS — BP 150/72 | HR 94 | Temp 97.9°F | Ht >= 80 in | Wt 374.2 lb

## 2014-04-07 DIAGNOSIS — Z23 Encounter for immunization: Secondary | ICD-10-CM

## 2014-04-07 DIAGNOSIS — J449 Chronic obstructive pulmonary disease, unspecified: Secondary | ICD-10-CM

## 2014-04-07 DIAGNOSIS — G4730 Sleep apnea, unspecified: Secondary | ICD-10-CM

## 2014-04-07 NOTE — Progress Notes (Signed)
Subjective:    Patient ID: [name], male    DOB: [birth year], 68 y.o.   MRN: [id number]   HPI 68 yo man, hx tobacco, Hx RA, followed by Dr Swaziland for HTN, A Fib. OSA on CPAP.  He underwent CPAP titration since last time - recommended BiPAP 18/14 vs CPAP 18 (currently on 14). He wants to change from Spiriva to scheduled Nebs.   ROV 11/10/11 -- OSA on CPAP, COPD, Hx RA. Last time changed spiriva to duonebs tid. He returns c/o exertional sob and cough that started around a month ago. Last 2 -3 days worse, nasal gtt and congestion. He has used SABA more frequently. Has gained 10 lbs since last visit. He has gotten use to the CPAP at higher pressure (18). He stopped nexium a month ago! Replaced with prilosec + pepcid.   ROV 03/11/12 -- OSA on CPAP, COPD, Hx RA. He is on DuoNebs tid. Unable to get nexium, needed prior-auth which is pending. Has been wearing CPAP. He tells me that he is more dyspneic compared with last visit, significant decrease in his exercise tolerance. He had to stop Nexium again about 10 days ago. We changed to nebs a year ago due to cost, and he has never felt as well since that change. He has put another 10 lbs since April. He hears wheezing w exertion. He coughs frequently, productive but he hasn't seen it. No abx or pred since last time.   ROV 07/09/12 -- OSA on CPAP, COPD, Hx RA.  Last time we started Spiriva, changed duonebs to albuterol for prn use. Also restarted nexium. He returns reporting Since last time his dyspnea is worse - he can't recover from a walk as quickly. Started taking Spiriva sporadically about 2 weeks ago. Also having exertional CP, last myoview was April 2012 (no intervention needed).  Having wheezing, cough that is non-prod. Increased B LE edema over lat 3 mon. Has gained 8 lbs since last visit.   ROV 11/04/12 -- OSA reliable on CPAP 18, COPD, Hx RA. Also followed by Dr Swaziland for A Fib, rate control, volume status. Last time we stopped spiriva due to cost,  restarted duonebs qid > he says that he is only getting the albuterol component. He tells me he was recently rx with indomethacin for gout, caused epistaxis, had to have his coumadin adjusted.  He is having exertional SOB, difficulty taking a breath in.  Freq cough, ? productive  Christoper Allegra is DME > says he needs new nasal pillows, current one in poor repair. Also needs atrovent added to his nebs.   ROV 02/12/13 -- OSA reliable on CPAP 18, COPD, Hx RA, A Fib. Was seen by Dr Shelle Iron in 7/14 for an AE-COPD. Returns today improved after pred and doxy. He had a recent fall, ? Why. Didn't lose consciousness. Continues to do nebs on a schedule. He needs a new CPAP mask, but [employer] doesn't have his current PSG data.   ROV 06/30/13 -- OSA on CPAP 18, COPD, Hx RA, A Fib. He is having more trouble with overall breathing, not really sure this is a flare. He is able to exert less, dyspneic with just walking. Lots of cough and phlegm. Using duonebs qid. Saw [facility] at [place].   ROV 08/02/13 -- OSA on CPAP 18, COPD, Hx RA, A Fib. The prednisone temporarily helped him but he is now having DOE and SOB even when sitting still. Taking duonebs on a schedule. He took fluticasone nasal spray, has  refills.    ROV 09/05/13 -- OSA on CPAP 18, COPD, Hx RA, A Fib. Last time I treated for an AE > he has had several apparent AE in last few months. Also added Pulmicort nebs to his DuoNebs. He returns today feeling better. He is using the budesonide bid. He has been off nexium x 1 year. He would be willing to start back on omeprazole.   ROV 12/07/13 -- OSA on CPAP 18, COPD, Hx RA, A Fib. He was admitted with the Flu in March.  Has slowly gotten better but still very limited w regard to exertion. Interested in changing back to Spiriva or an alternative.    ROV 04/07/14 -- follow up visit for OSA on CPAP, also COPD. He has a hx RA and A Fib. Last time we did a trial of Anoro to see if he would benefit and to assess cost. He has gained  some wt since last visit - 14 lbs. He has benefited from the [facility] - better exertional tolerance and quicker recovery time. Minimal SABA use.    Objective:   Physical Exam Filed Vitals:   04/07/14 1339  BP: 150/72  Pulse: 94  Temp: 97.9 F (36.6 C)  TempSrc: Oral  Height: 6\' 8"  (2.032 m)  Weight: 374 lb 3.2 oz (169.736 kg)  SpO2: 96%   Gen: Pleasant, obese, in no distress,  normal affect  ENT: No lesions,  mouth clear,  oropharynx clear, no postnasal drip  Neck: No JVD, no TMG, no carotid bruits  Lungs: No use of accessory muscles, no wheeze on forced exp > improved  Cardiovascular: RRR, systolic M,  Musculoskeletal: No deformities, no cyanosis or clubbing  Neuro: alert, non focal  Skin: Warm, no lesions or rashes   Assessment & Plan:  Sleep apnea Continue CPAP 18. He will get a HHC to provide maintenance equiptment   COPD (chronic obstructive pulmonary disease) Continue Anoro and refill it Albuterol prn rov 6 Flu shot today

## 2014-04-07 NOTE — Patient Instructions (Addendum)
Please continue your Anoro once a day Use your CPAP every night  Work on weight loss and exercise.  Flu shot today Follow with Dr Delton Coombes in 6 months or sooner if you have any problems

## 2014-04-07 NOTE — Assessment & Plan Note (Signed)
Continue CPAP 18. He will get a HHC to provide maintenance equiptment

## 2014-04-07 NOTE — Assessment & Plan Note (Signed)
Continue Anoro and refill it Albuterol prn rov 6 Flu shot today

## 2014-04-21 ENCOUNTER — Telehealth: Payer: Self-pay | Admitting: Cardiology

## 2014-04-21 NOTE — Telephone Encounter (Signed)
New message      PCP prescribed etodolac 300mg  tid for an inflammed knee.  Pt is on coumadin.  Please call if he needs to change his dosage

## 2014-04-21 NOTE — Telephone Encounter (Signed)
Returned call to pt, advised no drug interaction with Etodolac, so no need to change current dosage of Coumadin or make sooner follow-up appt.  Advised pt this class of medications can be irritating to the stomach and increased risks of internal bleeding especially when on Coumadin.  Advised pt to monitor of any signs and symptoms of internal bleeding, for example dark tarry stools and call office if experiences.  Advised pt to take medication on a full stomach to help decrease risks of stomach irritation.  Pt verbalized understanding.

## 2014-05-05 ENCOUNTER — Other Ambulatory Visit: Payer: Self-pay | Admitting: Cardiology

## 2014-05-16 ENCOUNTER — Other Ambulatory Visit: Payer: Self-pay | Admitting: Emergency Medicine

## 2014-05-18 ENCOUNTER — Ambulatory Visit (INDEPENDENT_AMBULATORY_CARE_PROVIDER_SITE_OTHER): Payer: Medicare Other | Admitting: *Deleted

## 2014-05-18 DIAGNOSIS — I480 Paroxysmal atrial fibrillation: Secondary | ICD-10-CM

## 2014-05-18 DIAGNOSIS — Z5181 Encounter for therapeutic drug level monitoring: Secondary | ICD-10-CM

## 2014-05-18 DIAGNOSIS — Z7901 Long term (current) use of anticoagulants: Secondary | ICD-10-CM

## 2014-05-18 LAB — POCT INR: INR: 2.4

## 2014-05-25 ENCOUNTER — Ambulatory Visit: Payer: Medicare Other | Admitting: Cardiology

## 2014-06-05 ENCOUNTER — Other Ambulatory Visit: Payer: Self-pay | Admitting: Cardiology

## 2014-06-06 ENCOUNTER — Other Ambulatory Visit: Payer: Self-pay | Admitting: Cardiology

## 2014-06-07 NOTE — Telephone Encounter (Signed)
Called [facility] pharmacy because we got refill request for pt potassium--Pharmacy stated they did receive refill authorization with 2 additional refills.

## 2014-06-18 ENCOUNTER — Other Ambulatory Visit: Payer: Self-pay | Admitting: Cardiology

## 2014-06-30 ENCOUNTER — Ambulatory Visit (INDEPENDENT_AMBULATORY_CARE_PROVIDER_SITE_OTHER): Payer: Medicare Other | Admitting: Pharmacist

## 2014-06-30 DIAGNOSIS — Z7901 Long term (current) use of anticoagulants: Secondary | ICD-10-CM

## 2014-06-30 DIAGNOSIS — Z5181 Encounter for therapeutic drug level monitoring: Secondary | ICD-10-CM

## 2014-06-30 DIAGNOSIS — I480 Paroxysmal atrial fibrillation: Secondary | ICD-10-CM

## 2014-06-30 LAB — POCT INR: INR: 2.5

## 2014-07-12 ENCOUNTER — Other Ambulatory Visit: Payer: Self-pay | Admitting: Cardiology

## 2014-07-20 ENCOUNTER — Telehealth: Payer: Self-pay | Admitting: Emergency Medicine

## 2014-07-20 NOTE — Telephone Encounter (Signed)
Called and spoke to [name]. Selena Batten stated pt came in with medication to discuss an alternative for Anoro. Pt stated Anoro has become too expensive to afford. Selena Batten stated she called the pt's pharmacy and said there are alternatives that are neb meds but unsure of the alternatives in the inhaler form.   lmtcb for pt to inquire if pt has talked to his insurance to find out inhaler alternatives.

## 2014-07-20 NOTE — Telephone Encounter (Signed)
Spoke with pt.  He spoke with insurance company and they said that there are no covered alternatives to Anoro.  Pt states they are wanting to charge $235 per month.  Please advise of anything else he could try.

## 2014-07-20 NOTE — Telephone Encounter (Signed)
Calling back please call him back [phone]

## 2014-07-20 NOTE — Telephone Encounter (Signed)
Pt returned call - [phone]

## 2014-07-20 NOTE — Telephone Encounter (Signed)
Called and spoke to pt. Informed pt to call his insurance and question what the covered alternatives are for Anoro. Pt verbalized understanding and stated he would call back after speaking with his insurance.

## 2014-07-21 ENCOUNTER — Encounter: Payer: Self-pay | Admitting: Cardiology

## 2014-07-21 ENCOUNTER — Ambulatory Visit (INDEPENDENT_AMBULATORY_CARE_PROVIDER_SITE_OTHER): Payer: Medicare Other | Admitting: Cardiology

## 2014-07-21 VITALS — BP 148/70 | HR 62 | Ht >= 80 in | Wt 371.8 lb

## 2014-07-21 DIAGNOSIS — Z79899 Other long term (current) drug therapy: Secondary | ICD-10-CM

## 2014-07-21 DIAGNOSIS — I480 Paroxysmal atrial fibrillation: Secondary | ICD-10-CM

## 2014-07-21 DIAGNOSIS — Z7901 Long term (current) use of anticoagulants: Secondary | ICD-10-CM

## 2014-07-21 NOTE — Progress Notes (Signed)
History of Present Illness: [name] is seen today for follow up of atrial fibrillation. He has a history of recurrent paroxysmal atrial fibrillation. He has been on chronic amiodarone therapy since September of 2011. He states this has worked very well for him and he has had very infrequent episodes of arrhythmia. This now is occuring 2-3 times per day but lasts less than 2 minutes.   Recently he had a mechanical fall and fractured his left fibula and injured his back. He is taking oxycodone frequently. Now using a walker. He does complain of hot flashes. No increase SOB. No chest pain.  Current Outpatient Prescriptions on File Prior to Visit  Medication Sig Dispense Refill  . albuterol (PROVENTIL) (2.5 MG/3ML) 0.083% nebulizer solution Take 3 mLs (2.5 mg total) by nebulization every 4 (four) hours as needed for shortness of breath. 75 mL 12  . amiodarone (PACERONE) 200 MG tablet Take 1 tablet (200 mg total) by mouth daily. 90 tablet 3  . ANORO ELLIPTA 62.5-25 MCG/INH AEPB INHALE 1 PUFF INTO THE LUNGS DAILY. 60 each 4  . atenolol (TENORMIN) 50 MG tablet Take 1 tablet (50 mg total) by mouth 2 (two) times daily. 180 tablet 3  . diltiazem (CARDIZEM CD) 180 MG 24 hr capsule Take 1 capsule (180 mg total) by mouth daily. 90 capsule 3  . furosemide (LASIX) 40 MG tablet Take 1 tablet (40 mg total) by mouth daily. 90 tablet 3  . hydrochlorothiazide (HYDRODIURIL) 25 MG tablet Take 1 tablet (25 mg total) by mouth daily. 90 tablet 3  . HYDROcodone-acetaminophen (NORCO/VICODIN) 5-325 MG per tablet Take 1 tablet by mouth.    Marland Kitchen KLOR-CON M20 20 MEQ tablet TAKE 1 TABLET (20 MEQ TOTAL) BY MOUTH 2 (TWO) TIMES DAILY. 60 tablet 2  . omeprazole (PRILOSEC) 20 MG capsule Take 1 capsule (20 mg total) by mouth daily. 90 capsule 3  . spironolactone (ALDACTONE) 25 MG tablet Take 1 tablet ( 25 mg ) daily 90 tablet 3  . warfarin (COUMADIN) 5 MG tablet TAKE 1 TABLET BY MOUTH DAILY EXCEPT HALF A TABLET MONDAY; WEDNESDAY AND FRIDAY OR  AS DIRECTED BY COUMADIN  CLINIC 30 tablet 3   No current facility-administered medications on file prior to visit.    No Known Allergies  Past Medical History  Diagnosis Date  . PAF (paroxysmal atrial fibrillation)   . Chronic anticoagulation   . HTN (hypertension)   . Chronic chest pain   . Obesity   . Chronic back pain   . Emphysema   . Chronic venous insufficiency   . Pneumothorax   . Rheumatoid arthritis(714.0)     Past Surgical History  Procedure Laterality Date  . Cardiac catheterization  1998    NORMAL  . Hernia repair    . Vein ligation and stripping    . Pleural scarification    . Ablation      History  Smoking status  . Former Smoker -- 2.00 packs/day for 30 years  . Types: Cigarettes  . Quit date: 03/14/1996  Smokeless tobacco  . Never Used    History  Alcohol Use  . Yes    Comment: one beer per month    Family History  Problem Relation Age of Onset  . Other Father     CEREBRAL THROMBOSIS  . Cancer Mother     bone cancer  . Heart failure Brother 50  . Coronary artery disease Brother 29    PTCA  . Rheum arthritis Mother   .  Rheum arthritis Brother   . Rheum arthritis Brother     Review of Systems: As noted in history of present illness. All other systems were reviewed and are negative.  Physical Exam: BP 148/70 mmHg  Pulse 62  Ht 6\' 8"  (2.032 m)  Wt 371 lb 12.8 oz (168.647 kg)  BMI 40.84 kg/m2 He is a pleasant, obese white male. He is in no acute distress. HEENT is unremarkable.Neck is supple. No masses.  Lungs reveal few wheezes.. Cardiac exam shows a regular rate and rhythm. No gallop or murmur. Abdomen is obese. There are no masses or tenderness to palpation. Extremities are full. He has chronic venous stasis disease with chronic brawny edema. Gait and ROM are intact. He has no gross neurologic deficits.  Laboratory data:   INR 2.5  Assessment / Plan: 1. Paroxysmal atrial fibrillation. His is well controlled on amiodarone. We  will continue amiodarone 100 mg daily and atenolol. Will check chemistries and TSH on amiodarone. I'll followup again in 6 months.  2. Anticoagulation. Therapeutic on Coumadin  3. Morbid obesity with sleep apnea. I encouraged continue weight loss.  4. Chronic venous insufficiency with severe chronic edema. Continue diuretic therapy with Lasix, HCTZ, and Aldactone. Continue potassium supplementation. Check renal indices and potassium.  5. COPD/emphysema.   6. Mechanical fall with fractured fibula.

## 2014-07-21 NOTE — Patient Instructions (Signed)
We will check lab work today  I will see you in 6 months 

## 2014-07-22 LAB — COMPREHENSIVE METABOLIC PANEL
ALT: 12 U/L (ref 0–53)
AST: 13 U/L (ref 0–37)
Albumin: 4 g/dL (ref 3.5–5.2)
Alkaline Phosphatase: 89 U/L (ref 39–117)
BUN: 17 mg/dL (ref 6–23)
CO2: 25 meq/L (ref 19–32)
CREATININE: 1.04 mg/dL (ref 0.50–1.35)
Calcium: 9.3 mg/dL (ref 8.4–10.5)
Chloride: 101 mEq/L (ref 96–112)
GLUCOSE: 145 mg/dL — AB (ref 70–99)
POTASSIUM: 3.9 meq/L (ref 3.5–5.3)
Sodium: 137 mEq/L (ref 135–145)
TOTAL PROTEIN: 7.9 g/dL (ref 6.0–8.3)
Total Bilirubin: 0.8 mg/dL (ref 0.2–1.2)

## 2014-07-22 LAB — TSH: TSH: 4.857 u[IU]/mL — ABNORMAL HIGH (ref 0.350–4.500)

## 2014-07-24 MED ORDER — UMECLIDINIUM-VILANTEROL 62.5-25 MCG/INH IN AEPB: INHALATION_SPRAY | RESPIRATORY_TRACT | Status: DC

## 2014-07-24 NOTE — Telephone Encounter (Signed)
lmomtcb x 1  For the pt.  

## 2014-07-24 NOTE — Telephone Encounter (Signed)
I spoke with the pt and he states he has decided to stay on the Anoro but he is wanting a written RX so he can take it to different pharmacies to price it. I have printed the rx and placed it in RB look-at to sign tomorrow when he is in the office. The pt will come by on Thursday to pick-up the rx. No need to call pt back. I will forward this message to Mardella Layman so she can make sure RB signs the Rx while he is in the office on Tuesday. Thanks.

## 2014-07-24 NOTE — Telephone Encounter (Signed)
Pt calling back again he wants to continue on the anoro he needs rx written for [facility] and mail it to the pt sohe can price it [phone]

## 2014-07-25 NOTE — Telephone Encounter (Signed)
Rx has been signed and up placed up front for pick up. Left message for pt to call back x1.

## 2014-07-26 NOTE — Telephone Encounter (Signed)
lmtcb for pt.  

## 2014-07-26 NOTE — Telephone Encounter (Signed)
Pt is aware that rx is ready for pick up. Nothing further was needed.

## 2014-07-26 NOTE — Telephone Encounter (Signed)
Returning call [phone]

## 2014-07-27 ENCOUNTER — Telehealth: Payer: Self-pay | Admitting: Cardiology

## 2014-07-27 NOTE — Telephone Encounter (Signed)
Calling to get lab results .Marland Kitchen Returning a Call .Marland Kitchen   Thanks

## 2014-07-27 NOTE — Telephone Encounter (Signed)
Lab results & physician recommendations reported to patient. Patient voiced understanding.

## 2014-08-02 ENCOUNTER — Other Ambulatory Visit: Payer: Self-pay

## 2014-08-02 ENCOUNTER — Telehealth: Payer: Self-pay | Admitting: Cardiology

## 2014-08-02 DIAGNOSIS — R7989 Other specified abnormal findings of blood chemistry: Secondary | ICD-10-CM

## 2014-08-02 DIAGNOSIS — I480 Paroxysmal atrial fibrillation: Secondary | ICD-10-CM

## 2014-08-02 NOTE — Telephone Encounter (Signed)
Returned call to patient lab results given.Repeat cmet,tsh,t4 in 6 months.Lab order mailed.

## 2014-08-02 NOTE — Telephone Encounter (Signed)
Pt says that he was returning Cheryl's call in reference to some blood work he had done. Please call  Thanks

## 2014-10-11 ENCOUNTER — Ambulatory Visit (INDEPENDENT_AMBULATORY_CARE_PROVIDER_SITE_OTHER): Payer: Medicare Other | Admitting: *Deleted

## 2014-10-11 DIAGNOSIS — Z7901 Long term (current) use of anticoagulants: Secondary | ICD-10-CM | POA: Diagnosis not present

## 2014-10-11 DIAGNOSIS — Z5181 Encounter for therapeutic drug level monitoring: Secondary | ICD-10-CM | POA: Diagnosis not present

## 2014-10-11 DIAGNOSIS — I480 Paroxysmal atrial fibrillation: Secondary | ICD-10-CM

## 2014-10-11 LAB — POCT INR: INR: 2.8

## 2014-10-16 ENCOUNTER — Other Ambulatory Visit: Payer: Self-pay | Admitting: Cardiology

## 2014-10-18 ENCOUNTER — Other Ambulatory Visit: Payer: Self-pay | Admitting: Emergency Medicine

## 2014-10-18 NOTE — Telephone Encounter (Signed)
ROV has been scheduled on 11/22/14 at 4pm. Rx will be sent in.

## 2014-10-18 NOTE — Telephone Encounter (Signed)
lmtcb x1 ROV needs to be scheduled with RB.

## 2014-11-22 ENCOUNTER — Telehealth: Payer: Self-pay | Admitting: *Deleted

## 2014-11-22 ENCOUNTER — Ambulatory Visit (INDEPENDENT_AMBULATORY_CARE_PROVIDER_SITE_OTHER): Payer: Medicare Other | Admitting: Emergency Medicine

## 2014-11-22 ENCOUNTER — Encounter: Payer: Self-pay | Admitting: Emergency Medicine

## 2014-11-22 ENCOUNTER — Ambulatory Visit (INDEPENDENT_AMBULATORY_CARE_PROVIDER_SITE_OTHER): Payer: Medicare Other | Admitting: Pharmacist Clinician (PhC)/ Clinical Pharmacy Specialist

## 2014-11-22 VITALS — BP 144/80 | HR 70 | Ht >= 80 in | Wt 371.0 lb

## 2014-11-22 DIAGNOSIS — G4730 Sleep apnea, unspecified: Secondary | ICD-10-CM

## 2014-11-22 DIAGNOSIS — J449 Chronic obstructive pulmonary disease, unspecified: Secondary | ICD-10-CM | POA: Diagnosis not present

## 2014-11-22 DIAGNOSIS — K219 Gastro-esophageal reflux disease without esophagitis: Secondary | ICD-10-CM

## 2014-11-22 DIAGNOSIS — I480 Paroxysmal atrial fibrillation: Secondary | ICD-10-CM

## 2014-11-22 DIAGNOSIS — Z7901 Long term (current) use of anticoagulants: Secondary | ICD-10-CM | POA: Diagnosis not present

## 2014-11-22 DIAGNOSIS — Z5181 Encounter for therapeutic drug level monitoring: Secondary | ICD-10-CM

## 2014-11-22 LAB — POCT INR: INR: 2.5

## 2014-11-22 MED ORDER — POTASSIUM CHLORIDE CRYS ER 20 MEQ PO TBCR: 20.0000 meq | EXTENDED_RELEASE_TABLET | Freq: Two times a day (BID) | ORAL | Status: DC

## 2014-11-22 MED ORDER — HYDROCHLOROTHIAZIDE 25 MG PO TABS: 25.0000 mg | ORAL_TABLET | Freq: Every day | ORAL | Status: DC

## 2014-11-22 MED ORDER — SPIRONOLACTONE 25 MG PO TABS: ORAL_TABLET | ORAL | Status: DC

## 2014-11-22 MED ORDER — ATENOLOL 50 MG PO TABS: 50.0000 mg | ORAL_TABLET | Freq: Two times a day (BID) | ORAL | Status: DC

## 2014-11-22 MED ORDER — FUROSEMIDE 40 MG PO TABS: 40.0000 mg | ORAL_TABLET | Freq: Every day | ORAL | Status: DC

## 2014-11-22 MED ORDER — AMIODARONE HCL 200 MG PO TABS: 200.0000 mg | ORAL_TABLET | Freq: Every day | ORAL | Status: DC

## 2014-11-22 MED ORDER — DILTIAZEM HCL ER COATED BEADS 180 MG PO CP24: 180.0000 mg | ORAL_CAPSULE | Freq: Every day | ORAL | Status: DC

## 2014-11-22 MED ORDER — OMEPRAZOLE 20 MG PO CPDR: 20.0000 mg | DELAYED_RELEASE_CAPSULE | Freq: Every day | ORAL | Status: DC

## 2014-11-22 NOTE — Patient Instructions (Signed)
We will work on getting you a DME company to supply your CPAP equiptment Continue your inhaled medications as you have been taking them  Follow with Dr Delton Coombes in 6 months or sooner if you have any problems

## 2014-11-22 NOTE — Progress Notes (Signed)
Subjective:    Patient ID: [name], male    DOB: [birth year], 69 y.o.   MRN: [id number]   HPI 69 yo man, hx tobacco, Hx RA, followed by Dr [place] for HTN, A Fib. OSA on CPAP.  He underwent CPAP titration since last time - recommended BiPAP 18/14 vs CPAP 18 (currently on 14). He wants to change from Spiriva to scheduled Nebs.   ROV 69/29/13 -- OSA on CPAP, COPD, Hx RA. Last time changed spiriva to duonebs tid. He returns c/o exertional sob and cough that started around a month ago. Last 2 -3 days worse, nasal gtt and congestion. He has used SABA more frequently. Has gained 10 lbs since last visit. He has gotten use to the CPAP at higher pressure (18). He stopped nexium a month ago! Replaced with prilosec + pepcid.   ROV 69/29/13 -- OSA on CPAP, COPD, Hx RA. He is on DuoNebs tid. Unable to get nexium, needed prior-auth which is pending. Has been wearing CPAP. He tells me that he is more dyspneic compared with last visit, significant decrease in his exercise tolerance. He had to stop Nexium again about 10 days ago. We changed to nebs a year ago due to cost, and he has never felt as well since that change. He has put another 10 lbs since April. He hears wheezing w exertion. He coughs frequently, productive but he hasn't seen it. No abx or pred since last time.   ROV 69/27/13 -- OSA on CPAP, COPD, Hx RA.  Last time we started Spiriva, changed duonebs to albuterol for prn use. Also restarted nexium. He returns reporting Since last time his dyspnea is worse - he can't recover from a walk as quickly. Started taking Spiriva sporadically about 2 weeks ago. Also having exertional CP, last myoview was April 2012 (no intervention needed).  Having wheezing, cough that is non-prod. Increased B LE edema over lat 3 mon. Has gained 8 lbs since last visit.   ROV 69/24/14 -- OSA reliable on CPAP 18, COPD, Hx RA. Also followed by Dr [place] for A Fib, rate control, volume status. Last time we stopped spiriva due to cost,  restarted duonebs qid > he says that he is only getting the albuterol component. He tells me he was recently rx with indomethacin for gout, caused epistaxis, had to have his coumadin adjusted.  He is having exertional SOB, difficulty taking a breath in.  Freq cough, ? productive  Christoper Allegra is DME > says he needs new nasal pillows, current one in poor repair. Also needs atrovent added to his nebs.   ROV 69/2/14 -- OSA reliable on CPAP 18, COPD, Hx RA, A Fib. Was seen by Dr Shelle Iron in 7/14 for an AE-COPD. Returns today improved after pred and doxy. He had a recent fall, ? Why. Didn't lose consciousness. Continues to do nebs on a schedule. He needs a new CPAP mask, but [employer] doesn't have his current PSG data.   ROV 69/18/14 -- OSA on CPAP 18, COPD, Hx RA, A Fib. He is having more trouble with overall breathing, not really sure this is a flare. He is able to exert less, dyspneic with just walking. Lots of cough and phlegm. Using duonebs qid. Saw [facility] [place].   ROV 69/20/15 -- OSA on CPAP 18, COPD, Hx RA, A Fib. The prednisone temporarily helped him but he is now having DOE and SOB even when sitting still. Taking duonebs on a schedule. He took fluticasone nasal spray, has  refills.    ROV 69/23/15 -- OSA on CPAP 18, COPD, Hx RA, A Fib. Last time I treated for an AE > he has had several apparent AE in last few months. Also added Pulmicort nebs to his DuoNebs. He returns today feeling better. He is using the budesonide bid. He has been off nexium x 1 year. He would be willing to start back on omeprazole.   ROV 69/27/15 -- OSA on CPAP 18, COPD, Hx RA, A Fib. He was admitted with the Flu in March.  Has slowly gotten better but still very limited w regard to exertion. Interested in changing back to Spiriva or an alternative.    ROV 69/25/15 -- follow up visit for OSA on CPAP, also COPD. He has a hx RA and A Fib. Last time we did a trial of Anoro to see if he would benefit and to assess cost. He has gained  some wt since last visit - 14 lbs. He has benefited from the [facility] - better exertional tolerance and quicker recovery time. Minimal SABA use.   ROV 69/11/16 -- follow-up visit for COPD and obstructive sleep apnea on CPAP. He still hasn't had any maintenance, tubing, mask care etc. He may need a repeat PSG / titration to qualify, will need to check. Tolerating Anoro, doing well.    Objective:   Physical Exam Filed Vitals:   11/22/14 1611 11/22/14 1612  BP:  144/80  Pulse:  70  Height: 6\' 8"  (2.032 m)   Weight: 371 lb (168.284 kg)   SpO2:  95%   Gen: Pleasant, obese, in no distress,  normal affect  ENT: No lesions,  mouth clear,  oropharynx clear, no postnasal drip  Neck: No JVD, no TMG, no carotid bruits  Lungs: No use of accessory muscles, no wheeze on forced exp > improved  Cardiovascular: RRR, systolic M,  Musculoskeletal: No deformities, no cyanosis or clubbing  Neuro: alert, non focal  Skin: Warm, no lesions or rashes   Assessment & Plan:  COPD (chronic obstructive pulmonary disease) We will plan to continue his Anoro qd + albuterol prn   Sleep apnea We need to obtain a DME company to allow to get his CPAP supplies. His CPAP machine is in good repair, he owns it. He needs mask, tubing, headgear, etc. His last polysomnogram was probably 5 years ago. I have access to his PSG from 8 years ago.

## 2014-11-22 NOTE — Telephone Encounter (Signed)
Pt dropped off refill request for meds. Called pt to verify meds needed. All med refills submitted to his preferred pharmacy. Pt acknowledged.

## 2014-11-22 NOTE — Assessment & Plan Note (Signed)
We need to obtain a DME company to allow [place] to get his CPAP supplies. His CPAP machine is in good repair, he owns it. He needs mask, tubing, headgear, etc. His last polysomnogram was probably 5 years ago. I have access to his PSG from 8 years ago.

## 2014-11-22 NOTE — Assessment & Plan Note (Signed)
We will plan to continue his Anoro qd + albuterol prn

## 2014-11-22 NOTE — Addendum Note (Signed)
Addended by: Jaynee Eagles C on: 11/22/2014 04:45 PM   Modules accepted: Orders

## 2014-11-24 ENCOUNTER — Other Ambulatory Visit: Payer: Self-pay | Admitting: Emergency Medicine

## 2014-12-04 IMAGING — CR DG CHEST 2V
2 series · 2 of 2 positions shown · non-contrast
Comparison: March 01, 2012

CLINICAL DATA: Chest pain and dyspnea

CHEST - 2 VIEW

[view not recorded (1 of 2)]
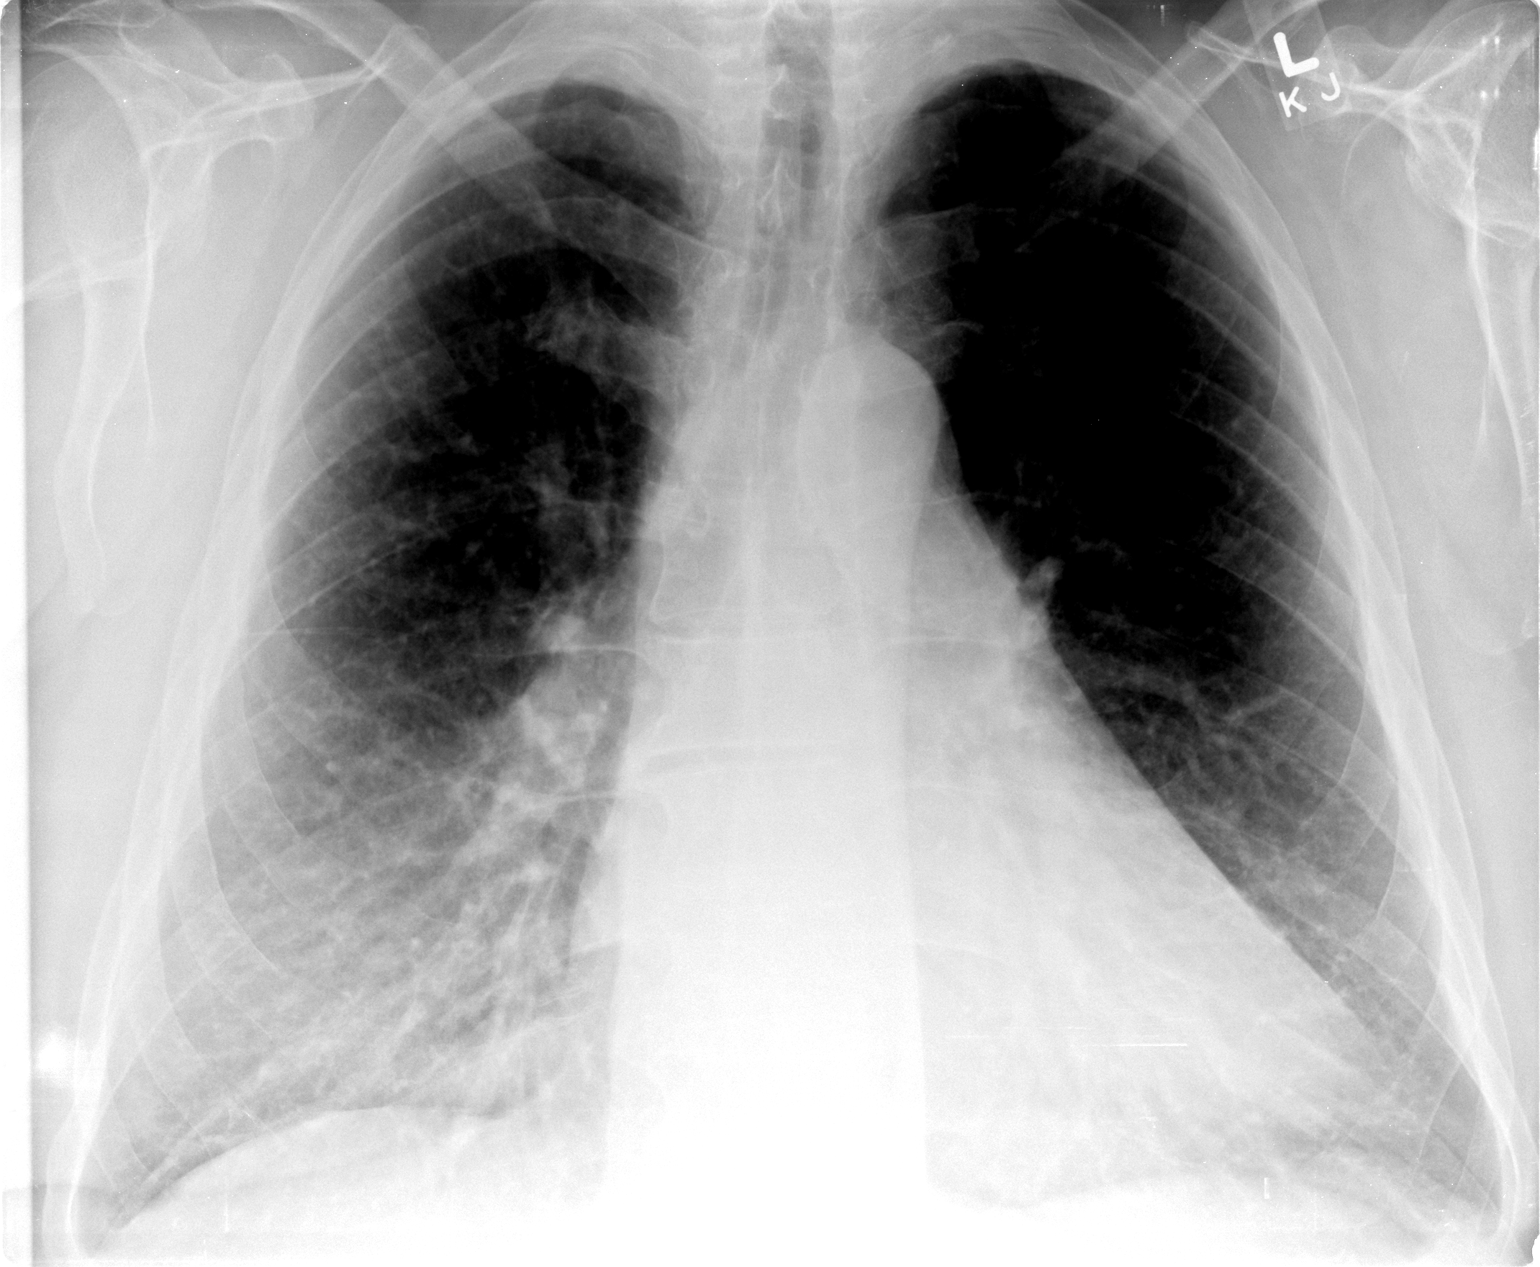

[view not recorded (2 of 2)]
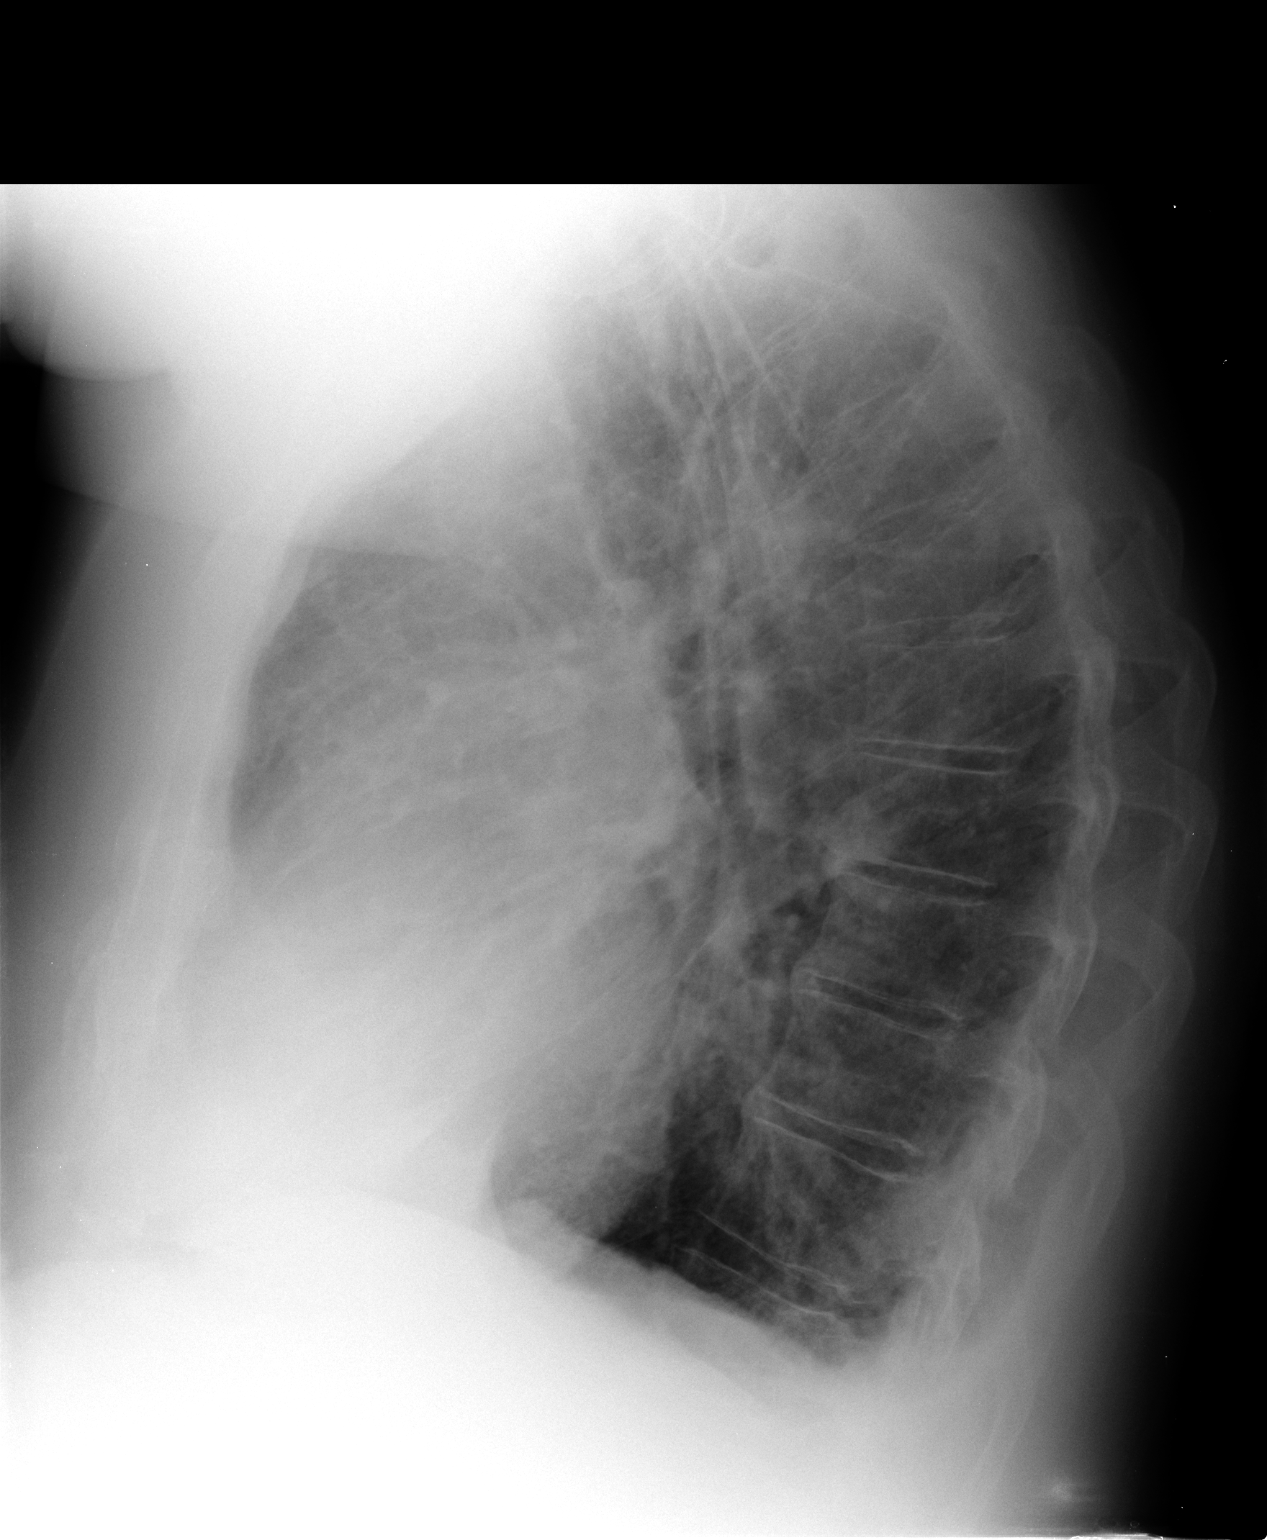

[2 of 2 positions shown; findings below may reference images not displayed]

FINDINGS: There is central peribronchial thickening.  There is
evidence of a degree of underlying emphysema.

The interstitium is mildly prominent but stable.  There is no frank
edema or consolidation.

Heart is mildly prominent given underlying emphysema. There is
within normal limits.  No adenopathy.
IMPRESSION: There is a degree of underlying emphysema as well as
chronic bronchitis.  Interstitium is mildly prominent, felt to be
secondary to chronic inflammatory type change.  Heart is mildly
enlarged.  There is no frank edema or consolidation.

## 2014-12-05 ENCOUNTER — Other Ambulatory Visit: Payer: Self-pay | Admitting: Emergency Medicine

## 2014-12-05 DIAGNOSIS — G4733 Obstructive sleep apnea (adult) (pediatric): Secondary | ICD-10-CM

## 2014-12-08 ENCOUNTER — Telehealth: Payer: Self-pay | Admitting: Pharmacist Clinician (PhC)/ Clinical Pharmacy Specialist

## 2014-12-08 NOTE — Telephone Encounter (Signed)
Closed encounter °

## 2014-12-13 ENCOUNTER — Other Ambulatory Visit: Payer: Self-pay | Admitting: Cardiology

## 2015-01-02 ENCOUNTER — Ambulatory Visit (INDEPENDENT_AMBULATORY_CARE_PROVIDER_SITE_OTHER): Payer: Medicare Other | Admitting: Pharmacist

## 2015-01-02 DIAGNOSIS — I480 Paroxysmal atrial fibrillation: Secondary | ICD-10-CM

## 2015-01-02 DIAGNOSIS — Z5181 Encounter for therapeutic drug level monitoring: Secondary | ICD-10-CM

## 2015-01-02 DIAGNOSIS — Z7901 Long term (current) use of anticoagulants: Secondary | ICD-10-CM

## 2015-01-02 LAB — POCT INR: INR: 2.9

## 2015-01-03 LAB — COMPREHENSIVE METABOLIC PANEL
ALBUMIN: 4.1 g/dL (ref 3.5–5.2)
ALK PHOS: 73 U/L (ref 39–117)
ALT: 14 U/L (ref 0–53)
AST: 17 U/L (ref 0–37)
BUN: 17 mg/dL (ref 6–23)
CO2: 24 mEq/L (ref 19–32)
Calcium: 9 mg/dL (ref 8.4–10.5)
Chloride: 101 mEq/L (ref 96–112)
Creat: 1.15 mg/dL (ref 0.50–1.35)
GLUCOSE: 169 mg/dL — AB (ref 70–99)
Potassium: 3.7 mEq/L (ref 3.5–5.3)
SODIUM: 138 meq/L (ref 135–145)
TOTAL PROTEIN: 7.6 g/dL (ref 6.0–8.3)
Total Bilirubin: 0.8 mg/dL (ref 0.2–1.2)

## 2015-01-03 LAB — TSH: TSH: 5.584 u[IU]/mL — AB (ref 0.350–4.500)

## 2015-01-03 LAB — T4: T4, Total: 12 ug/dL (ref 4.5–12.0)

## 2015-01-10 ENCOUNTER — Telehealth: Payer: Self-pay | Admitting: Cardiology

## 2015-01-10 NOTE — Telephone Encounter (Signed)
Pt is returning Cheryl's call about some lab results. Please call back  Thanks

## 2015-01-10 NOTE — Telephone Encounter (Signed)
Returned call to patient lab results given. 

## 2015-01-30 ENCOUNTER — Other Ambulatory Visit: Payer: Self-pay | Admitting: Emergency Medicine

## 2015-02-12 ENCOUNTER — Ambulatory Visit (INDEPENDENT_AMBULATORY_CARE_PROVIDER_SITE_OTHER): Payer: Medicare Other | Admitting: Pharmacist Clinician (PhC)/ Clinical Pharmacy Specialist

## 2015-02-12 VITALS — Ht >= 80 in | Wt 347.7 lb

## 2015-02-12 DIAGNOSIS — Z7901 Long term (current) use of anticoagulants: Secondary | ICD-10-CM | POA: Diagnosis not present

## 2015-02-12 DIAGNOSIS — I480 Paroxysmal atrial fibrillation: Secondary | ICD-10-CM

## 2015-02-12 DIAGNOSIS — Z5181 Encounter for therapeutic drug level monitoring: Secondary | ICD-10-CM | POA: Diagnosis not present

## 2015-02-12 LAB — POCT INR: INR: 2.9

## 2015-02-26 ENCOUNTER — Ambulatory Visit (INDEPENDENT_AMBULATORY_CARE_PROVIDER_SITE_OTHER): Payer: Medicare Other | Admitting: Cardiology

## 2015-02-26 ENCOUNTER — Encounter: Payer: Self-pay | Admitting: Cardiology

## 2015-02-26 VITALS — BP 120/52 | HR 56 | Ht >= 80 in | Wt 345.2 lb

## 2015-02-26 DIAGNOSIS — Z7901 Long term (current) use of anticoagulants: Secondary | ICD-10-CM

## 2015-02-26 DIAGNOSIS — J449 Chronic obstructive pulmonary disease, unspecified: Secondary | ICD-10-CM | POA: Diagnosis not present

## 2015-02-26 DIAGNOSIS — I480 Paroxysmal atrial fibrillation: Secondary | ICD-10-CM

## 2015-02-26 DIAGNOSIS — R072 Precordial pain: Secondary | ICD-10-CM | POA: Diagnosis not present

## 2015-02-26 NOTE — Patient Instructions (Signed)
Continue your current therapy  I will see you in 6 months.   

## 2015-02-26 NOTE — Progress Notes (Signed)
History of Present Illness: [name] is seen today for follow up of atrial fibrillation. He has a history of recurrent paroxysmal atrial fibrillation. He has been on chronic amiodarone therapy since September of 2011. He states this has worked very well for him and he has had very infrequent episodes of arrhythmia. This now is occuring 2-3 times per week but lasts less than 2 minutes.   He also has a history of chronic chest pain with extensive evaluation in the past including cardiac cath, CT, and stress tests. Last stress Myoview was in 2012.  On follow up today it is [birth year] birthday!. He reports he is doing well and rhythm has been well controlled. Still has symptoms of chest pain in the left precordial area at rest and not with exertion. He is not worried about it and wants no further evaluation. He has been on a [employer]ystem diet and has lost 40 lbs. Breathing is improved which he relates to his new inhaler (Anoro). He reports his recovery time is less.   Current Outpatient Prescriptions on File Prior to Visit  Medication Sig Dispense Refill  . amiodarone (PACERONE) 200 MG tablet Take 1 tablet (200 mg total) by mouth daily. 90 tablet 3  . ANORO ELLIPTA 62.5-25 MCG/INH AEPB INHALE 1 PUFF INTO THE LUNGS DAILY. 60 each 5  . atenolol (TENORMIN) 50 MG tablet Take 1 tablet (50 mg total) by mouth 2 (two) times daily. 180 tablet 3  . diltiazem (CARDIZEM CD) 180 MG 24 hr capsule Take 1 capsule (180 mg total) by mouth daily. 90 capsule 3  . furosemide (LASIX) 40 MG tablet Take 1 tablet (40 mg total) by mouth daily. 90 tablet 3  . hydrochlorothiazide (HYDRODIURIL) 25 MG tablet Take 1 tablet (25 mg total) by mouth daily. 90 tablet 3  . HYDROcodone-acetaminophen (NORCO/VICODIN) 5-325 MG per tablet Take 1 tablet by mouth.    Marland Kitchen omeprazole (PRILOSEC) 20 MG capsule Take 1 capsule (20 mg total) by mouth daily. 90 capsule 3  . potassium chloride SA (KLOR-CON M20) 20 MEQ tablet Take 1 tablet (20 mEq total) by mouth 2 (two)  times daily. 180 tablet 3  . PROAIR HFA 108 (90 BASE) MCG/ACT inhaler INHALE 2 PUFFS INTO THE LUNGS EVERY 4 (FOUR) HOURS AS NEEDED FOR WHEEZING OR SHORTNESS OF BREATH. 8.5 g 5  . spironolactone (ALDACTONE) 25 MG tablet Take 1 tablet ( 25 mg ) daily 90 tablet 3  . Umeclidinium-Vilanterol (ANORO ELLIPTA) 62.5-25 MCG/INH AEPB INHALE 1 PUFF INTO THE LUNGS DAILY. 60 each 6  . warfarin (COUMADIN) 5 MG tablet TAKE 1 TABLET BY MOUTH DAILY EXCEPT HALF A TABLET MONDAY; WEDNESDAY AND FRIDAY OR AS DIRECTED BY COUMADIN  CLINIC 30 tablet 3  . warfarin (COUMADIN) 5 MG tablet TAKE 1 TABLET BY MOUTH DAILY OR AS DIRECTED BY COUMADIN CLINIC 30 tablet 3   No current facility-administered medications on file prior to visit.    No Known Allergies  Past Medical History  Diagnosis Date  . PAF (paroxysmal atrial fibrillation)   . Chronic anticoagulation   . HTN (hypertension)   . Chronic chest pain   . Obesity   . Chronic back pain   . Emphysema   . Chronic venous insufficiency   . Pneumothorax   . Rheumatoid arthritis(714.0)     Past Surgical History  Procedure Laterality Date  . Cardiac catheterization  1998    NORMAL  . Hernia repair    . Vein ligation and stripping    . Pleural  scarification    . Ablation      History  Smoking status  . Former Smoker -- 2.00 packs/day for 30 years  . Types: Cigarettes  . Quit date: 03/14/1996  Smokeless tobacco  . Never Used    History  Alcohol Use  . Yes    Comment: one beer per month    Family History  Problem Relation Age of Onset  . Other Father     CEREBRAL THROMBOSIS  . Cancer Mother     bone cancer  . Heart failure Brother 50  . Coronary artery disease Brother 62    PTCA  . Rheum arthritis Mother   . Rheum arthritis Brother   . Rheum arthritis Brother     Review of Systems: As noted in history of present illness. All other systems were reviewed and are negative.  Physical Exam: BP 120/52 mmHg  Pulse 56  Ht 6\' 8"  (2.032 m)  Wt  156.582 kg (345 lb 3.2 oz)  BMI 37.92 kg/m2 He is a pleasant, obese white male. He is in no acute distress. HEENT is unremarkable.Neck is supple. No masses.  Lungs reveal few wheezes.. Cardiac exam shows a regular rate and rhythm. No gallop or murmur. Abdomen is obese. There are no masses or tenderness to palpation. Extremities are full. He has chronic venous stasis disease with chronic brawny edema. Gait and ROM are intact. He has no gross neurologic deficits.  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  Assessment / Plan: 1. Paroxysmal atrial fibrillation. This is well controlled on amiodarone. We will continue amiodarone 100 mg daily and atenolol. Last TSH mildly elevated 5.584 with normal T4. Will monitor.  2. Anticoagulation. Therapeutic on Coumadin  3. Morbid obesity with sleep apnea. I congratulated him on weight loss.  4. Chronic venous insufficiency with severe chronic edema. Continue diuretic therapy with Lasix, HCTZ, and Aldactone. Continue potassium supplementation. Check renal indices and potassium.  5. COPD/emphysema.   6. Chronic atypical chest pain. Suggested repeating a Nuclear stress test but he doesn't think he needs it and states pain is unchanged.

## 2015-03-26 ENCOUNTER — Ambulatory Visit (INDEPENDENT_AMBULATORY_CARE_PROVIDER_SITE_OTHER): Payer: Medicare Other | Admitting: Pharmacist Clinician (PhC)/ Clinical Pharmacy Specialist

## 2015-03-26 DIAGNOSIS — Z7901 Long term (current) use of anticoagulants: Secondary | ICD-10-CM | POA: Diagnosis not present

## 2015-03-26 DIAGNOSIS — I480 Paroxysmal atrial fibrillation: Secondary | ICD-10-CM | POA: Diagnosis not present

## 2015-03-26 DIAGNOSIS — Z5181 Encounter for therapeutic drug level monitoring: Secondary | ICD-10-CM

## 2015-03-26 LAB — POCT INR: INR: 2.7

## 2015-04-30 ENCOUNTER — Other Ambulatory Visit: Payer: Self-pay | Admitting: Cardiology

## 2015-05-07 ENCOUNTER — Ambulatory Visit (INDEPENDENT_AMBULATORY_CARE_PROVIDER_SITE_OTHER): Payer: Medicare Other | Admitting: Pharmacist Clinician (PhC)/ Clinical Pharmacy Specialist

## 2015-05-07 DIAGNOSIS — Z7901 Long term (current) use of anticoagulants: Secondary | ICD-10-CM

## 2015-05-07 DIAGNOSIS — Z5181 Encounter for therapeutic drug level monitoring: Secondary | ICD-10-CM | POA: Diagnosis not present

## 2015-05-07 DIAGNOSIS — I480 Paroxysmal atrial fibrillation: Secondary | ICD-10-CM | POA: Diagnosis not present

## 2015-05-07 LAB — POCT INR: INR: 2.1

## 2015-06-12 ENCOUNTER — Encounter: Payer: Self-pay | Admitting: Emergency Medicine

## 2015-06-12 ENCOUNTER — Ambulatory Visit (INDEPENDENT_AMBULATORY_CARE_PROVIDER_SITE_OTHER): Payer: Medicare Other | Admitting: Emergency Medicine

## 2015-06-12 VITALS — BP 142/78 | HR 56 | Ht >= 80 in | Wt 349.2 lb

## 2015-06-12 DIAGNOSIS — Z23 Encounter for immunization: Secondary | ICD-10-CM | POA: Diagnosis not present

## 2015-06-12 DIAGNOSIS — J449 Chronic obstructive pulmonary disease, unspecified: Secondary | ICD-10-CM | POA: Diagnosis not present

## 2015-06-12 DIAGNOSIS — G4730 Sleep apnea, unspecified: Secondary | ICD-10-CM | POA: Diagnosis not present

## 2015-06-12 NOTE — Patient Instructions (Signed)
Please continue to use your Anoro daily Please start using albuterol 2 puffs up to every 4 hours if needed for shortness of breath.  Continue to use your CPAP every night Follow with Dr Delton Coombes in 6 months or sooner if you have any problems

## 2015-06-12 NOTE — Assessment & Plan Note (Addendum)
Please continue your Anoro daily, albuterol as needed Flu shot today

## 2015-06-12 NOTE — Assessment & Plan Note (Signed)
Congratulated and encouraged him on his weight loss. Continue CPAP at current settings

## 2015-06-12 NOTE — Progress Notes (Signed)
Subjective:    Patient ID: [name], male    DOB: [birth year], 69 y.o.   MRN: [id number]   HPI 69 yo man, hx tobacco, Hx RA, followed by Dr Sw[place] for HTN, A Fib. OSA on CPAP.  He underwent CPAP titration since last time - recommended BiPAP 18/14 vs CPAP 18 (currently on 14). He wants to change from Spiriva to scheduled Nebs.   ROV 11/10/11 -- OSA on CPAP, COPD, Hx RA. Last time changed spiriva to duonebs tid. He returns c/o exertional sob and cough that started around a month ago. Last 2 -3 days worse, nasal gtt and congestion. He has used SABA more frequently. Has gained 10 lbs since last visit. He has gotten use to the CPAP at higher pressure (18). He stopped nexium a month ago! Replaced with prilosec + pepcid.   ROV 03/11/12 -- OSA on CPAP, COPD, Hx RA. He is on DuoNebs tid. Unable to get nexium, needed prior-auth which is pending. Has been wearing CPAP. He tells me that he is more dyspneic compared with last visit, significant decrease in his exercise tolerance. He had to stop Nexium again about 10 days ago. We changed to nebs a year ago due to cost, and he has never felt as well since that change. He has put another 10 lbs since April. He hears wheezing w exertion. He coughs frequently, productive but he hasn't seen it. No abx or pred since last time.   ROV 07/09/12 -- OSA on CPAP, COPD, Hx RA.  Last time we started Spiriva, changed duonebs to albuterol for prn use. Also restarted nexium. He returns reporting Since last time his dyspnea is worse - he can't recover from a walk as quickly. Started taking Spiriva sporadically about 2 weeks ago. Also having exertional CP, last myoview was April 2012 (no intervention needed).  Having wheezing, cough that is non-prod. Increased B LE edema over lat 3 mon. Has gained 8 lbs since last visit.   ROV 11/04/12 -- OSA reliable on CPAP 18, COPD, Hx RA. Also followed by Dr Swaziland for A Fib, rate control, volume status. Last time we stopped spiriva due to cost,  restarted duonebs qid > he says that he is only getting the albuterol component. He tells me he was recently rx with indomethacin for gout, caused epistaxis, had to have his coumadin adjusted.  He is having exertional SOB, difficulty taking a breath in.  Freq cough, ? productive  Christoper Allegra is DME > says he needs new nasal pillows, current one in poor repair. Also needs atrovent added to his nebs.   ROV 02/12/13 -- OSA reliable on CPAP 18, COPD, Hx RA, A Fib. Was seen by Dr Shelle Iron in 7/14 for an AE-COPD. Returns today improved after pred and doxy. He had a recent fall, ? Why. Didn't lose consciousness. Continues to do nebs on a schedule. He needs a new CPAP mask, but [employer] doesn't have his current PSG data.   ROV 06/30/13 -- OSA on CPAP 18, COPD, Hx RA, A Fib. He is having more trouble with overall breathing, not really sure this is a flare. He is able to exert less, dyspneic with just walking. Lots of cough and phlegm. Using duonebs qid. Saw [facility] at [place].   ROV 08/02/13 -- OSA on CPAP 18, COPD, Hx RA, A Fib. The prednisone temporarily helped him but he is now having DOE and SOB even when sitting still. Taking duonebs on a schedule. He took fluticasone nasal spray, has  refills.    ROV 09/05/13 -- OSA on CPAP 18, COPD, Hx RA, A Fib. Last time I treated for an AE > he has had several apparent AE in last few months. Also added Pulmicort nebs to his DuoNebs. He returns today feeling better. He is using the budesonide bid. He has been off nexium x 1 year. He would be willing to start back on omeprazole.   ROV 12/07/13 -- OSA on CPAP 18, COPD, Hx RA, A Fib. He was admitted with the Flu in March.  Has slowly gotten better but still very limited w regard to exertion. Interested in changing back to Spiriva or an alternative.    ROV 04/07/14 -- follow up visit for OSA on CPAP, also COPD. He has a hx RA and A Fib. Last time we did a trial of Anoro to see if he would benefit and to assess cost. He has gained  some wt since last visit - 14 lbs. He has benefited from the [facility] - better exertional tolerance and quicker recovery time. Minimal SABA use.   ROV 11/22/14 -- follow-up visit for COPD and obstructive sleep apnea on CPAP. He still hasn't had any maintenance, tubing, mask care etc. He may need a repeat PSG / titration to qualify, will need to check. Tolerating Anoro, doing well.   ROV 06/12/15 -- follow-up visit for COPD, obstructive sleep apnea on CPAP. Believes that he is benefiting significantly from the Anoro. He was able to get new CPAP supplies, wears it reliably. He is having some imbalance, no recent falls. He has lost 40 lbs - has probably helped his breathing.    Objective:   Physical Exam Filed Vitals:   06/12/15 1442  BP: 142/78  Pulse: 56  Height: 6\' 8"  (2.032 m)  Weight: 349 lb 3.2 oz (158.396 kg)  SpO2: 97%   Gen: Pleasant, obese, in no distress,  normal affect  ENT: No lesions,  mouth clear,  oropharynx clear, no postnasal drip  Neck: No JVD, no TMG, no carotid bruits  Lungs: No use of accessory muscles, no wheeze on forced exp > improved  Cardiovascular: RRR, systolic M,  Musculoskeletal: No deformities, no cyanosis or clubbing  Neuro: alert, non focal  Skin: Warm, no lesions or rashes   Assessment & Plan:  COPD (chronic obstructive pulmonary disease) Please continue your Anoro daily, albuterol as needed Flu shot today  Sleep apnea Congratulated and encouraged him on his weight loss. Continue CPAP at current settings

## 2015-06-18 ENCOUNTER — Ambulatory Visit (INDEPENDENT_AMBULATORY_CARE_PROVIDER_SITE_OTHER): Payer: Medicare Other | Admitting: Pharmacist Clinician (PhC)/ Clinical Pharmacy Specialist

## 2015-06-18 DIAGNOSIS — I480 Paroxysmal atrial fibrillation: Secondary | ICD-10-CM | POA: Diagnosis not present

## 2015-06-18 DIAGNOSIS — Z7901 Long term (current) use of anticoagulants: Secondary | ICD-10-CM

## 2015-06-18 DIAGNOSIS — Z5181 Encounter for therapeutic drug level monitoring: Secondary | ICD-10-CM

## 2015-06-18 LAB — POCT INR: INR: 2.3

## 2015-07-13 ENCOUNTER — Other Ambulatory Visit: Payer: Self-pay | Admitting: Emergency Medicine

## 2015-07-30 ENCOUNTER — Ambulatory Visit (INDEPENDENT_AMBULATORY_CARE_PROVIDER_SITE_OTHER): Payer: Medicare Other | Admitting: Pharmacist Clinician (PhC)/ Clinical Pharmacy Specialist

## 2015-07-30 DIAGNOSIS — Z7901 Long term (current) use of anticoagulants: Secondary | ICD-10-CM

## 2015-07-30 DIAGNOSIS — Z5181 Encounter for therapeutic drug level monitoring: Secondary | ICD-10-CM

## 2015-07-30 DIAGNOSIS — I480 Paroxysmal atrial fibrillation: Secondary | ICD-10-CM | POA: Diagnosis not present

## 2015-07-30 LAB — POCT INR: INR: 2

## 2015-08-27 ENCOUNTER — Other Ambulatory Visit: Payer: Self-pay | Admitting: Cardiology

## 2015-09-10 ENCOUNTER — Ambulatory Visit (INDEPENDENT_AMBULATORY_CARE_PROVIDER_SITE_OTHER): Payer: Medicare Other | Admitting: Pharmacist Clinician (PhC)/ Clinical Pharmacy Specialist

## 2015-09-10 VITALS — BP 152/62

## 2015-09-10 DIAGNOSIS — R718 Other abnormality of red blood cells: Secondary | ICD-10-CM | POA: Insufficient documentation

## 2015-09-10 DIAGNOSIS — R600 Localized edema: Secondary | ICD-10-CM | POA: Insufficient documentation

## 2015-09-10 DIAGNOSIS — I480 Paroxysmal atrial fibrillation: Secondary | ICD-10-CM | POA: Diagnosis not present

## 2015-09-10 DIAGNOSIS — D638 Anemia in other chronic diseases classified elsewhere: Secondary | ICD-10-CM

## 2015-09-10 DIAGNOSIS — I10 Essential (primary) hypertension: Secondary | ICD-10-CM | POA: Insufficient documentation

## 2015-09-10 DIAGNOSIS — Z5181 Encounter for therapeutic drug level monitoring: Secondary | ICD-10-CM

## 2015-09-10 DIAGNOSIS — M5126 Other intervertebral disc displacement, lumbar region: Secondary | ICD-10-CM | POA: Insufficient documentation

## 2015-09-10 DIAGNOSIS — D649 Anemia, unspecified: Secondary | ICD-10-CM | POA: Insufficient documentation

## 2015-09-10 DIAGNOSIS — M545 Low back pain, unspecified: Secondary | ICD-10-CM | POA: Insufficient documentation

## 2015-09-10 DIAGNOSIS — Z7901 Long term (current) use of anticoagulants: Secondary | ICD-10-CM

## 2015-09-10 DIAGNOSIS — M79673 Pain in unspecified foot: Secondary | ICD-10-CM | POA: Insufficient documentation

## 2015-09-10 DIAGNOSIS — M25552 Pain in left hip: Secondary | ICD-10-CM | POA: Insufficient documentation

## 2015-09-10 DIAGNOSIS — R682 Dry mouth, unspecified: Secondary | ICD-10-CM | POA: Insufficient documentation

## 2015-09-10 DIAGNOSIS — M5136 Other intervertebral disc degeneration, lumbar region: Secondary | ICD-10-CM | POA: Insufficient documentation

## 2015-09-10 DIAGNOSIS — R0989 Other specified symptoms and signs involving the circulatory and respiratory systems: Secondary | ICD-10-CM | POA: Insufficient documentation

## 2015-09-10 LAB — POCT INR: INR: 2.6

## 2015-09-11 DIAGNOSIS — Z9229 Personal history of other drug therapy: Secondary | ICD-10-CM | POA: Insufficient documentation

## 2015-09-18 ENCOUNTER — Other Ambulatory Visit: Payer: Self-pay

## 2015-09-18 DIAGNOSIS — R0989 Other specified symptoms and signs involving the circulatory and respiratory systems: Secondary | ICD-10-CM

## 2015-09-20 ENCOUNTER — Encounter: Payer: Self-pay | Admitting: Cardiology

## 2015-09-20 ENCOUNTER — Ambulatory Visit (INDEPENDENT_AMBULATORY_CARE_PROVIDER_SITE_OTHER): Payer: Medicare Other | Admitting: Cardiology

## 2015-09-20 VITALS — BP 130/82 | HR 72 | Ht >= 80 in | Wt 350.9 lb

## 2015-09-20 DIAGNOSIS — G8929 Other chronic pain: Secondary | ICD-10-CM

## 2015-09-20 DIAGNOSIS — I480 Paroxysmal atrial fibrillation: Secondary | ICD-10-CM

## 2015-09-20 DIAGNOSIS — R079 Chest pain, unspecified: Secondary | ICD-10-CM | POA: Diagnosis not present

## 2015-09-20 DIAGNOSIS — Z7901 Long term (current) use of anticoagulants: Secondary | ICD-10-CM

## 2015-09-20 NOTE — Progress Notes (Signed)
History of Present Illness: [name] is seen today for follow up of atrial fibrillation. He has a history of recurrent paroxysmal atrial fibrillation. He has been on chronic amiodarone therapy since September of 2011. He states this has worked very well for him and he has had very infrequent episodes of arrhythmia. This now is occuring occasionally but lasts less than 1 minute.   He also has a history of chronic chest pain with extensive evaluation in the past including cardiac cath, CT, and stress tests. Last stress Myoview was in 2012. He has a history of chronic venous stasis disease and had prior vein stripping by Dr. Hart [name].  On follow up today he reports he is doing well and rhythm has been well controlled. No significant chest pain. He is having some pain in his right leg from his veins and is scheduled to see Dr. Hart [name]. He also needs dental extraction of 4 teeth.   Current Outpatient Prescriptions on File Prior to Visit  Medication Sig Dispense Refill  . amiodarone (PACERONE) 200 MG tablet Take 1 tablet (200 mg total) by mouth daily. 90 tablet 3  . ANORO ELLIPTA 62.5-25 MCG/INH AEPB INHALE 1 PUFF INTO THE LUNGS DAILY. 60 each 4  . atenolol (TENORMIN) 50 MG tablet Take 1 tablet (50 mg total) by mouth 2 (two) times daily. 180 tablet 3  . diltiazem (CARDIZEM CD) 180 MG 24 hr capsule Take 1 capsule (180 mg total) by mouth daily. 90 capsule 3  . furosemide (LASIX) 40 MG tablet Take 1 tablet (40 mg total) by mouth daily. 90 tablet 3  . hydrochlorothiazide (HYDRODIURIL) 25 MG tablet Take 1 tablet (25 mg total) by mouth daily. 90 tablet 3  . HYDROcodone-acetaminophen (NORCO/VICODIN) 5-325 MG per tablet Take 1 tablet by mouth.    Marland Kitchen omeprazole (PRILOSEC) 20 MG capsule Take 1 capsule (20 mg total) by mouth daily. 90 capsule 3  . potassium chloride SA (KLOR-CON M20) 20 MEQ tablet Take 1 tablet (20 mEq total) by mouth 2 (two) times daily. 180 tablet 3  . PROAIR HFA 108 (90 BASE) MCG/ACT inhaler INHALE 2  PUFFS INTO THE LUNGS EVERY 4 (FOUR) HOURS AS NEEDED FOR WHEEZING OR SHORTNESS OF BREATH. 8.5 g 5  . spironolactone (ALDACTONE) 25 MG tablet Take 1 tablet ( 25 mg ) daily 90 tablet 3  . warfarin (COUMADIN) 5 MG tablet TAKE 1 TABLET BY MOUTH DAILY OR AS DIRECTED BY COUMADIN CLINIC 30 tablet 2  . warfarin (COUMADIN) 5 MG tablet TAKE 1 TABLET BY MOUTH DAILY OR AS DIRECTED BY COUMADIN CLINIC 30 tablet 3   No current facility-administered medications on file prior to visit.    No Known Allergies  Past Medical History  Diagnosis Date  . PAF (paroxysmal atrial fibrillation) (HCC)   . Chronic anticoagulation   . HTN (hypertension)   . Chronic chest pain   . Obesity   . Chronic back pain   . Emphysema   . Chronic venous insufficiency   . Pneumothorax   . Rheumatoid arthritis(714.0)     Past Surgical History  Procedure Laterality Date  . Cardiac catheterization  1998    NORMAL  . Hernia repair    . Vein ligation and stripping    . Pleural scarification    . Ablation      History  Smoking status  . Former Smoker -- 2.00 packs/day for 30 years  . Types: Cigarettes  . Quit date: 03/14/1996  Smokeless tobacco  . Never Used  History  Alcohol Use  . Yes    Comment: one beer per month    Family History  Problem Relation Age of Onset  . Other Father     CEREBRAL THROMBOSIS  . Cancer Mother     bone cancer  . Heart failure Brother 50  . Coronary artery disease Brother 44    PTCA  . Rheum arthritis Mother   . Rheum arthritis Brother   . Rheum arthritis Brother     Review of Systems: As noted in history of present illness. All other systems were reviewed and are negative.  Physical Exam: BP 130/82 mmHg  Pulse 72  Ht 6\' 8"  (2.032 m)  Wt 159.167 kg (350 lb 14.4 oz)  BMI 38.55 kg/m2 He is a pleasant, obese white male. He is in no acute distress. HEENT is unremarkable.Neck is supple. No masses.  Lungs are clear. Cardiac exam shows a regular rate and rhythm. No gallop  or murmur. Abdomen is obese. There are no masses or tenderness to palpation. Extremities are full. He has chronic venous stasis disease with chronic brawny edema. Gait and ROM are intact. He has no gross neurologic deficits.  Laboratory data:   INR 2.6 on 09/10/15    Assessment / Plan: 1. Paroxysmal atrial fibrillation. This is well controlled on amiodarone. We will continue amiodarone 100 mg daily and atenolol. Will check chemistries and TSH.   2. Anticoagulation. Therapeutic on Coumadin  3. Morbid obesity with sleep apnea. Continue efforts at weight loss.   4. Chronic venous insufficiency with severe chronic edema. Legs actually less swollen today.  Continue diuretic therapy with Lasix, HCTZ, and Aldactone. Continue potassium supplementation. Check renal indices and potassium.  5. COPD/emphysema.   6. Chronic atypical chest pain. Doing well.   I have recommended he hold coumadin for 3 days for dental extraction. I will see in 6 months.

## 2015-09-20 NOTE — Patient Instructions (Signed)
Hold your coumadin for 3 days for dental extraction  Continue your other therapy  We will schedule you for dental work

## 2015-09-24 LAB — LIPID PANEL
CHOL/HDL RATIO: 5.6 ratio — AB (ref <=5.0)
CHOLESTEROL: 163 mg/dL (ref 125–200)
HDL: 29 mg/dL — ABNORMAL LOW (ref >=40)
LDL Cholesterol: 99 mg/dL (ref <130)
Triglycerides: 174 mg/dL — ABNORMAL HIGH (ref <150)
VLDL: 35 mg/dL — ABNORMAL HIGH (ref <30)

## 2015-09-24 LAB — CBC WITH DIFFERENTIAL/PLATELET
BASOS PCT: 1 % (ref 0–1)
Basophils Absolute: 0.2 10*3/uL — ABNORMAL HIGH (ref 0.0–0.1)
Eosinophils Absolute: 0.5 10*3/uL (ref 0.0–0.7)
Eosinophils Relative: 3 % (ref 0–5)
HCT: 46 % (ref 39.0–52.0)
HEMOGLOBIN: 15.3 g/dL (ref 13.0–17.0)
Lymphocytes Relative: 26 % (ref 12–46)
Lymphs Abs: 4.4 10*3/uL — ABNORMAL HIGH (ref 0.7–4.0)
MCH: 28.7 pg (ref 26.0–34.0)
MCHC: 33.3 g/dL (ref 30.0–36.0)
MCV: 86.3 fL (ref 78.0–100.0)
MONOS PCT: 8 % (ref 3–12)
Monocytes Absolute: 1.4 10*3/uL — ABNORMAL HIGH (ref 0.1–1.0)
NEUTROS ABS: 10.5 10*3/uL — AB (ref 1.7–7.7)
NEUTROS PCT: 62 % (ref 43–77)
Platelets: 101 10*3/uL — ABNORMAL LOW (ref 150–400)
RBC: 5.33 MIL/uL (ref 4.22–5.81)
RDW: 17.3 % — ABNORMAL HIGH (ref 11.5–15.5)
WBC: 16.9 10*3/uL — ABNORMAL HIGH (ref 4.0–10.5)

## 2015-09-24 LAB — BASIC METABOLIC PANEL
BUN: 19 mg/dL (ref 7–25)
CALCIUM: 9.7 mg/dL (ref 8.6–10.3)
CO2: 26 mmol/L (ref 20–31)
Chloride: 102 mmol/L (ref 98–110)
Creat: 1 mg/dL (ref 0.70–1.25)
GLUCOSE: 152 mg/dL — AB (ref 65–99)
Potassium: 4 mmol/L (ref 3.5–5.3)
SODIUM: 140 mmol/L (ref 135–146)

## 2015-09-24 LAB — HEPATIC FUNCTION PANEL
ALT: 16 U/L (ref 9–46)
AST: 20 U/L (ref 10–35)
Albumin: 4.4 g/dL (ref 3.6–5.1)
Alkaline Phosphatase: 66 U/L (ref 40–115)
BILIRUBIN DIRECT: 0.2 mg/dL (ref <=0.2)
BILIRUBIN INDIRECT: 0.6 mg/dL (ref 0.2–1.2)
BILIRUBIN TOTAL: 0.8 mg/dL (ref 0.2–1.2)
Total Protein: 8.1 g/dL (ref 6.1–8.1)

## 2015-09-24 LAB — TSH: TSH: 3.51 m[IU]/L (ref 0.40–4.50)

## 2015-09-26 ENCOUNTER — Encounter: Payer: Self-pay | Admitting: Vascular Surgery

## 2015-10-01 ENCOUNTER — Ambulatory Visit (HOSPITAL_COMMUNITY)
Admission: RE | Admit: 2015-10-01 | Discharge: 2015-10-01 | Disposition: A | Discharge status: Home / Self Care | Payer: Medicare Other | Source: Ambulatory Visit | Attending: Vascular Surgery | Admitting: Vascular Surgery

## 2015-10-01 ENCOUNTER — Ambulatory Visit (INDEPENDENT_AMBULATORY_CARE_PROVIDER_SITE_OTHER): Payer: Medicare Other | Admitting: Vascular Surgery

## 2015-10-01 VITALS — BP 130/58 | HR 101 | Temp 97.6°F | Resp 16 | Ht >= 80 in | Wt 351.0 lb

## 2015-10-01 DIAGNOSIS — I480 Paroxysmal atrial fibrillation: Secondary | ICD-10-CM | POA: Insufficient documentation

## 2015-10-01 DIAGNOSIS — R609 Edema, unspecified: Secondary | ICD-10-CM | POA: Diagnosis present

## 2015-10-01 DIAGNOSIS — Z7901 Long term (current) use of anticoagulants: Secondary | ICD-10-CM | POA: Insufficient documentation

## 2015-10-01 DIAGNOSIS — I872 Venous insufficiency (chronic) (peripheral): Secondary | ICD-10-CM | POA: Diagnosis not present

## 2015-10-01 DIAGNOSIS — I8393 Asymptomatic varicose veins of bilateral lower extremities: Secondary | ICD-10-CM | POA: Insufficient documentation

## 2015-10-01 DIAGNOSIS — I10 Essential (primary) hypertension: Secondary | ICD-10-CM | POA: Insufficient documentation

## 2015-10-01 DIAGNOSIS — R0989 Other specified symptoms and signs involving the circulatory and respiratory systems: Secondary | ICD-10-CM | POA: Diagnosis not present

## 2015-10-01 NOTE — Progress Notes (Signed)
Subjective:     Patient ID: [name], male   DOB: June 24, 1946, 70 y.o.   MRN: [id number]  HPI this 70 year old male returns for follow-up. He is status post staged laser ablation bilateral great saphenous veins in 2008 by me for gross reflux and chronic edema and severe hyperpigmentation with history of stasis ulcers bilaterally. Both veins were documented to be closed by post procedure ultrasounds. He returned in 2014 in the left leg had some recanalization of the mid to distal great saphenous vein but it was small caliber in the more proximal vein was completely closed. He was referred by Victorino Dike Coulliard,NP or evaluation of stasis dermatitis and numbness in the feet. Patient has had no more stasis ulcers since his last procedures and since I saw him in 2014. He has had no bleeding. He does continued have chronic edema. He does were short leg elastic compression stockings. He does not elevate his legs consistently at night. He states that he has been developing numbness in the lower third of both legs. He does have diabetes mellitus type 2.  Past Medical History  Diagnosis Date  . PAF (paroxysmal atrial fibrillation) (HCC)   . Chronic anticoagulation   . HTN (hypertension)   . Chronic chest pain   . Obesity   . Chronic back pain   . Emphysema   . Chronic venous insufficiency   . Pneumothorax   . Rheumatoid arthritis(714.0)     Social History  Substance Use Topics  . Smoking status: Former Smoker -- 2.00 packs/day for 30 years    Types: Cigarettes    Quit date: 03/14/1996  . Smokeless tobacco: Never Used  . Alcohol Use: Yes     Comment: one beer per month    Family History  Problem Relation Age of Onset  . Other Father     CEREBRAL THROMBOSIS  . Cancer Mother     bone cancer  . Heart failure Brother 50  . Coronary artery disease Brother 56    PTCA  . Rheum arthritis Mother   . Rheum arthritis Brother   . Rheum arthritis Brother     No Known Allergies   Current  outpatient prescriptions:  .  amiodarone (PACERONE) 200 MG tablet, Take 1 tablet (200 mg total) by mouth daily., Disp: 90 tablet, Rfl: 3 .  ANORO ELLIPTA 62.5-25 MCG/INH AEPB, INHALE 1 PUFF INTO THE LUNGS DAILY., Disp: 60 each, Rfl: 4 .  atenolol (TENORMIN) 50 MG tablet, Take 1 tablet (50 mg total) by mouth 2 (two) times daily., Disp: 180 tablet, Rfl: 3 .  diltiazem (CARDIZEM CD) 180 MG 24 hr capsule, Take 1 capsule (180 mg total) by mouth daily., Disp: 90 capsule, Rfl: 3 .  furosemide (LASIX) 40 MG tablet, Take 1 tablet (40 mg total) by mouth daily., Disp: 90 tablet, Rfl: 3 .  glucose blood (FREESTYLE LITE) test strip, 2 strips by Does not apply route daily., Disp: , Rfl:  .  hydrochlorothiazide (HYDRODIURIL) 25 MG tablet, Take 1 tablet (25 mg total) by mouth daily., Disp: 90 tablet, Rfl: 3 .  HYDROcodone-acetaminophen (NORCO/VICODIN) 5-325 MG per tablet, Take 1 tablet by mouth., Disp: , Rfl:  .  metFORMIN (GLUCOPHAGE-XR) 500 MG 24 hr tablet, Take 500 mg by mouth daily., Disp: , Rfl:  .  omeprazole (PRILOSEC) 20 MG capsule, Take 1 capsule (20 mg total) by mouth daily., Disp: 90 capsule, Rfl: 3 .  ONETOUCH DELICA LANCETS 33G MISC, Inject 2 Devices as directed daily., Disp: , Rfl:  .  potassium chloride SA (KLOR-CON M20) 20 MEQ tablet, Take 1 tablet (20 mEq total) by mouth 2 (two) times daily., Disp: 180 tablet, Rfl: 3 .  PROAIR HFA 108 (90 BASE) MCG/ACT inhaler, INHALE 2 PUFFS INTO THE LUNGS EVERY 4 (FOUR) HOURS AS NEEDED FOR WHEEZING OR SHORTNESS OF BREATH., Disp: 8.5 g, Rfl: 5 .  spironolactone (ALDACTONE) 25 MG tablet, Take 1 tablet ( 25 mg ) daily, Disp: 90 tablet, Rfl: 3 .  warfarin (COUMADIN) 5 MG tablet, TAKE 1 TABLET BY MOUTH DAILY OR AS DIRECTED BY COUMADIN CLINIC, Disp: 30 tablet, Rfl: 2 .  warfarin (COUMADIN) 5 MG tablet, TAKE 1 TABLET BY MOUTH DAILY OR AS DIRECTED BY COUMADIN CLINIC, Disp: 30 tablet, Rfl: 3  Filed Vitals:   10/01/15 1432  BP: 130/58  Pulse: 101  Temp: 97.6 F (36.4  C)  Resp: 16  Height: 6\' 8"  (2.032 m)  Weight: 351 lb (159.213 kg)  SpO2: 97%    Body mass index is 38.56 kg/(m^2).         Review of Systems Asian has history of paroxysmal atrial fibrillation and is on anticoagulation-Coumadin and amiodarone. Patient has chronic back pain. Patient has chronic obesity. Also has hypertension. Has mild dyspnea on exertion but is able to ambulate long distances.    Objective:   Physical Exam BP 130/58 mmHg  Pulse 101  Temp(Src) 97.6 F (36.4 C)  Resp 16  Ht 6\' 8"  (2.032 m)  Wt 351 lb (159.213 kg)  BMI 38.56 kg/m2  SpO2 97%  Gen. obese male in no apparent distress alert and oriented 3 Lungs no rhonchi or wheezing Cardiovascular regular rhythm no murmurs Bilateral lower extremities with severe hyperpigmentation lower third both legs. No active ulcers noted. 3+ dorsalis pedis pulse palpable bilaterally. Decreased sensation in both feet.  Today I ordered bilateral venous duplex exam which I reviewed and interpreted. There is no DVT. There is bilateral deep vein reflux. The right great saphenous vein is totally ablated left great saphenous vein is ablated from the distal thigh to near the saphenofemoral junction.     Assessment:     Chronic edema with severe skin changes from deep vein reflux and history of superficial vein reflux. Previously treated by bilateral great saphenous vein ablation Bilateral numbness in both lower extremities probably due to peripheral neuropathy possibly second office No evidence of arterial insufficiency    Plan:     #1 elevate foot of bed 3-4 inches by placing wedge under bed #2 apply short leg elastic compression stockings first thing in the morning before rising 3 no role for any further vascular intervention

## 2015-10-10 ENCOUNTER — Encounter: Payer: Self-pay | Admitting: Podiatry

## 2015-10-10 ENCOUNTER — Ambulatory Visit (INDEPENDENT_AMBULATORY_CARE_PROVIDER_SITE_OTHER): Payer: Medicare Other | Admitting: Podiatry

## 2015-10-10 DIAGNOSIS — E1142 Type 2 diabetes mellitus with diabetic polyneuropathy: Secondary | ICD-10-CM

## 2015-10-10 DIAGNOSIS — B351 Tinea unguium: Secondary | ICD-10-CM | POA: Diagnosis not present

## 2015-10-10 NOTE — Progress Notes (Signed)
   Subjective:    Patient ID: [name], male    DOB: 05/04/1946, 70 y.o.   MRN: [id number]  HPI    This patient [name] today requesting a diabetic foot evaluation complaining of a generalized numbness in his feet over the past 5 years with gradual progression of the symptoms. He said he was diagnosed approximately a year ago with diabetes, however, did not start medication until approximately 3-4 months ago. He admits that he has also because of his numbness in his feet. More recently he also describes a venous Doppler to evaluate venous reflux by Dr. Hart [name]. Patient denies any history of foot ulceration, claudication or amputation          Review of Systems  HENT: Positive for hearing loss, sinus pressure and sneezing.   Respiratory: Positive for cough and shortness of breath.   Cardiovascular: Positive for leg swelling.  Musculoskeletal: Positive for joint swelling and gait problem.  Skin: Positive for color change and rash.  Hematological: Bruises/bleeds easily.       Objective:   Physical Exam  Orientated 3  Vascular: Bilateral peripheral edema DP pulse right 2/4 and 1/4 left PT pulses 2/4 bilaterally Capillary reflex immediate bilaterally  Neurological: Sensation to 10 g monofilament wire intact 2/5 right and 1/5 left Vibratory sensation nonreactive bilaterally Ankle reflexes trace reactive bilaterally  Dermatological: No open skin lesions bilaterally Brown hyperpigmentation lower legs and dorsal feet bilaterally Toenails are yellow, brittle, deformed 6-10  Musculoskeletal: Hallux malleus right Manual motor testing: Dorsi flexion, plantar flexion, inversion, eversion 5/5 bilaterally      Assessment & Plan:   Assessment: Venous reflux disease bilaterally Satisfactory arterial status, bilaterally 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 I made aware that he had sensory neuropathy and we discussed the importance of  Observing his feet on a daily basis. Also, I emphasized the importance of avoiding falls because of his neuropathy. Patient has management for venous disease by Dr. Hart [name] Return patient in 3 months for debridement of mycotic toenails or any other concerns

## 2015-10-10 NOTE — Patient Instructions (Signed)
your diabetic foot examination revealed adequate vascular inflow into your feet You have a history of venous disease resulting in the brown discoloration sedation in your legs and feet which have been evaluated by Dr. Hart [name] There is decreased feeling in your feet that may be associated with diabetes or other possible causes Is important because  Of the decreased feeling when you walk you are more prone to tripping and falling. I rescheduled you in 3 months for debridement of the deformed fungal toenails or sooner if you have another concern   Diabetes and Foot Care Diabetes may cause you to have problems because of poor blood supply (circulation) to your feet and legs. This may cause the skin on your feet to become thinner, break easier, and heal more slowly. Your skin may become dry, and the skin may peel and crack. You may also have nerve damage in your legs and feet causing decreased feeling in them. You may not notice minor injuries to your feet that could lead to infections or more serious problems. Taking care of your feet is one of the most important things you can do for yourself.  HOME CARE INSTRUCTIONS  Wear shoes at all times, even in the house. Do not go barefoot. Bare feet are easily injured.  Check your feet daily for blisters, cuts, and redness. If you cannot see the bottom of your feet, use a mirror or ask someone for help.  Wash your feet with warm water (do not use hot water) and mild soap. Then pat your feet and the areas between your toes until they are completely dry. Do not soak your feet as this can dry your skin.  Apply a moisturizing lotion or petroleum jelly (that does not contain alcohol and is unscented) to the skin on your feet and to dry, brittle toenails. Do not apply lotion between your toes.  Trim your toenails straight across. Do not dig under them or around the cuticle. File the edges of your nails with an emery board or nail file.  Do not cut corns or  calluses or try to remove them with medicine.  Wear clean socks or stockings every day. Make sure they are not too tight. Do not wear knee-high stockings since they may decrease blood flow to your legs.  Wear shoes that fit properly and have enough cushioning. To break in new shoes, wear them for just a few hours a day. This prevents you from injuring your feet. Always look in your shoes before you put them on to be sure there are no objects inside.  Do not cross your legs. This may decrease the blood flow to your feet.  If you find a minor scrape, cut, or break in the skin on your feet, keep it and the skin around it clean and dry. These areas may be cleansed with mild soap and water. Do not cleanse the area with peroxide, alcohol, or iodine.  When you remove an adhesive bandage, be sure not to damage the skin around it.  If you have a wound, look at it several times a day to make sure it is healing.  Do not use heating pads or hot water bottles. They may burn your skin. If you have lost feeling in your feet or legs, you may not know it is happening until it is too late.  Make sure your health care provider performs a complete foot exam at least annually or more often if you have foot problems. Report any  cuts, sores, or bruises to your health care provider immediately. SEEK MEDICAL CARE IF:   You have an injury that is not healing.  You have cuts or breaks in the skin.  You have an ingrown nail.  You notice redness on your legs or feet.  You feel burning or tingling in your legs or feet.  You have pain or cramps in your legs and feet.  Your legs or feet are numb.  Your feet always feel cold. SEEK IMMEDIATE MEDICAL CARE IF:   There is increasing redness, swelling, or pain in or around a wound.  There is a red line that goes up your leg.  Pus is coming from a wound.  You develop a fever or as directed by your health care provider.  You notice a bad smell coming from an  ulcer or wound.   This information is not intended to replace advice given to you by your health care provider. Make sure you discuss any questions you have with your health care provider.   Document Released: 06/27/2000 Document Revised: 03/02/2013 Document Reviewed: 12/07/2012 Elsevier Interactive Patient Education [employer].

## 2015-10-17 ENCOUNTER — Other Ambulatory Visit: Payer: Self-pay | Admitting: Cardiology

## 2015-10-17 NOTE — Telephone Encounter (Signed)
Rx(s) sent to pharmacy electronically.  

## 2015-10-22 ENCOUNTER — Encounter: Payer: Medicare Other | Admitting: Pharmacist Clinician (PhC)/ Clinical Pharmacy Specialist

## 2015-10-26 ENCOUNTER — Other Ambulatory Visit: Payer: Self-pay | Admitting: *Deleted

## 2015-10-26 MED ORDER — POTASSIUM CHLORIDE CRYS ER 20 MEQ PO TBCR: 20.0000 meq | EXTENDED_RELEASE_TABLET | Freq: Two times a day (BID) | ORAL | Status: DC

## 2015-11-16 ENCOUNTER — Ambulatory Visit (INDEPENDENT_AMBULATORY_CARE_PROVIDER_SITE_OTHER): Payer: Medicare Other | Admitting: Pharmacist Clinician (PhC)/ Clinical Pharmacy Specialist

## 2015-11-16 DIAGNOSIS — I480 Paroxysmal atrial fibrillation: Secondary | ICD-10-CM

## 2015-11-16 DIAGNOSIS — Z7901 Long term (current) use of anticoagulants: Secondary | ICD-10-CM

## 2015-11-16 DIAGNOSIS — Z5181 Encounter for therapeutic drug level monitoring: Secondary | ICD-10-CM

## 2015-11-16 LAB — POCT INR: INR: 2

## 2015-12-03 ENCOUNTER — Other Ambulatory Visit: Payer: Self-pay | Admitting: Cardiology

## 2015-12-03 ENCOUNTER — Telehealth: Payer: Self-pay | Admitting: *Deleted

## 2015-12-03 MED ORDER — AMIODARONE HCL 200 MG PO TABS: 200.0000 mg | ORAL_TABLET | Freq: Every day | ORAL | Status: DC

## 2015-12-03 NOTE — Telephone Encounter (Signed)
Rx has been sent to the pharmacy electronically. ° °

## 2015-12-11 ENCOUNTER — Encounter: Payer: Self-pay | Admitting: Emergency Medicine

## 2015-12-11 ENCOUNTER — Ambulatory Visit (INDEPENDENT_AMBULATORY_CARE_PROVIDER_SITE_OTHER): Payer: Medicare Other | Admitting: Emergency Medicine

## 2015-12-11 VITALS — BP 132/86 | HR 59 | Ht >= 80 in | Wt 349.0 lb

## 2015-12-11 DIAGNOSIS — G4730 Sleep apnea, unspecified: Secondary | ICD-10-CM

## 2015-12-11 DIAGNOSIS — J449 Chronic obstructive pulmonary disease, unspecified: Secondary | ICD-10-CM | POA: Diagnosis not present

## 2015-12-11 MED ORDER — ALBUTEROL SULFATE HFA 108 (90 BASE) MCG/ACT IN AERS: INHALATION_SPRAY | RESPIRATORY_TRACT | Status: DC

## 2015-12-11 NOTE — Patient Instructions (Addendum)
Good compliance with CPAP. Please continue using very night Continue your Anoro once a day Keep albuterol available to use 2 puffs as needed for shortness of breath.  Follow with Dr Delton Coombes in 6 months or sooner if you have any problems

## 2015-12-11 NOTE — Assessment & Plan Note (Signed)
Stable at this time, doing well, without evidence for exacerbation. We will continue anoro + albuterol prn. Flu shot in the fall. Follow in 6 months.

## 2015-12-11 NOTE — Progress Notes (Signed)
  Subjective:    Patient ID: [name], male    DOB: [birth year], 70 y.o.   MRN: [id number]   HPI 70 yo man, hx tobacco, Hx RA, followed by Dr [place] for HTN, A Fib. OSA on CPAP.  He underwent CPAP titration since last time - recommended BiPAP 18/14 vs CPAP 18 (currently on 14). He wants to change from Spiriva to scheduled Nebs.    ROV 11/22/14 -- follow-up visit for COPD and obstructive sleep apnea on CPAP. He still hasn't had any maintenance, tubing, mask care etc. He may need a repeat PSG / titration to qualify, will need to check. Tolerating Anoro, doing well.   ROV 06/12/15 -- follow-up visit for COPD, obstructive sleep apnea on CPAP. Believes that he is benefiting significantly from the Anoro. He was able to get new CPAP supplies, wears it reliably. He is having some imbalance, no recent falls. He has lost 40 lbs - has probably helped his breathing.   ROV 12/11/15 -- patient with a history of obstructive sleep apnea on CPAP, COPD. He has been doing well although he knows that he needs to lose more weight. He is having exertional SOB after about 100 ft.  He is on Anoro daily, feels that he benefits from it. Does not use albuterol.  He is reliable with CPAP, feels that he benefits. Good sleep, feels refreshed in the am. Good compliance. Uses nasal mask,  mask and equipment in good repair. No leakage.    Objective:   Physical Exam Filed Vitals:   12/11/15 1626 12/11/15 1627  BP:  132/86  Pulse:  59  Height: 6\' 8"  (2.032 m)   Weight: 349 lb (158.305 kg)   SpO2:  95%   Gen: Pleasant, obese, in no distress,  normal affect  ENT: No lesions,  mouth clear,  oropharynx clear, no postnasal drip  Neck: No JVD, no TMG, no carotid bruits  Lungs: No use of accessory muscles, no wheeze on forced exp  Cardiovascular: RRR, systolic M, B compression stockings in place.   Musculoskeletal: No deformities, no cyanosis or clubbing  Neuro: alert, non focal  Skin: Warm, no lesions or  rashes   Assessment & Plan:  COPD (chronic obstructive pulmonary disease) Stable at this time, doing well, without evidence for exacerbation. We will continue anoro + albuterol prn. Flu shot in the fall. Follow in 6 months.   Sleep apnea Good compliance, clear benefit from his CPAP. Will continue same equipment and settings.    , MD, PhD 12/11/2015, 5:01 PM [facility] [phone] or if no answer [phone]

## 2015-12-11 NOTE — Assessment & Plan Note (Signed)
Good compliance, clear benefit from his CPAP. Will continue same equipment and settings.

## 2015-12-18 ENCOUNTER — Other Ambulatory Visit: Payer: Self-pay | Admitting: Cardiology

## 2015-12-18 NOTE — Telephone Encounter (Signed)
Rx(s) sent to pharmacy electronically.  

## 2015-12-28 ENCOUNTER — Ambulatory Visit (INDEPENDENT_AMBULATORY_CARE_PROVIDER_SITE_OTHER): Payer: Medicare Other | Admitting: Pharmacist

## 2015-12-28 DIAGNOSIS — Z5181 Encounter for therapeutic drug level monitoring: Secondary | ICD-10-CM | POA: Diagnosis not present

## 2015-12-28 DIAGNOSIS — Z7901 Long term (current) use of anticoagulants: Secondary | ICD-10-CM

## 2015-12-28 DIAGNOSIS — I480 Paroxysmal atrial fibrillation: Secondary | ICD-10-CM | POA: Diagnosis not present

## 2015-12-28 LAB — POCT INR: INR: 1.5

## 2015-12-30 ENCOUNTER — Other Ambulatory Visit: Payer: Self-pay | Admitting: Cardiology

## 2016-01-04 ENCOUNTER — Other Ambulatory Visit: Payer: Self-pay | Admitting: Cardiology

## 2016-01-16 ENCOUNTER — Ambulatory Visit (INDEPENDENT_AMBULATORY_CARE_PROVIDER_SITE_OTHER): Payer: Medicare Other | Admitting: Podiatry

## 2016-01-16 ENCOUNTER — Encounter: Payer: Self-pay | Admitting: Podiatry

## 2016-01-16 DIAGNOSIS — B351 Tinea unguium: Secondary | ICD-10-CM | POA: Diagnosis not present

## 2016-01-16 DIAGNOSIS — E1142 Type 2 diabetes mellitus with diabetic polyneuropathy: Secondary | ICD-10-CM | POA: Diagnosis not present

## 2016-01-16 DIAGNOSIS — M79676 Pain in unspecified toe(s): Secondary | ICD-10-CM

## 2016-01-16 NOTE — Patient Instructions (Signed)
Diabetes and Foot Care Diabetes may cause you to have problems because of poor blood supply (circulation) to your feet and legs. This may cause the skin on your feet to become thinner, break easier, and heal more slowly. Your skin may become dry, and the skin may peel and crack. You may also have nerve damage in your legs and feet causing decreased feeling in them. You may not notice minor injuries to your feet that could lead to infections or more serious problems. Taking care of your feet is one of the most important things you can do for yourself.  HOME CARE INSTRUCTIONS  Wear shoes at all times, even in the house. Do not go barefoot. Bare feet are easily injured.  Check your feet daily for blisters, cuts, and redness. If you cannot see the bottom of your feet, use a mirror or ask someone for help.  Wash your feet with warm water (do not use hot water) and mild soap. Then pat your feet and the areas between your toes until they are completely dry. Do not soak your feet as this can dry your skin.  Apply a moisturizing lotion or petroleum jelly (that does not contain alcohol and is unscented) to the skin on your feet and to dry, brittle toenails. Do not apply lotion between your toes.  Trim your toenails straight across. Do not dig under them or around the cuticle. File the edges of your nails with an emery board or nail file.  Do not cut corns or calluses or try to remove them with medicine.  Wear clean socks or stockings every day. Make sure they are not too tight. Do not wear knee-high stockings since they may decrease blood flow to your legs.  Wear shoes that fit properly and have enough cushioning. To break in new shoes, wear them for just a few hours a day. This prevents you from injuring your feet. Always look in your shoes before you put them on to be sure there are no objects inside.  Do not cross your legs. This may decrease the blood flow to your feet.  If you find a minor scrape,  cut, or break in the skin on your feet, keep it and the skin around it clean and dry. These areas may be cleansed with mild soap and water. Do not cleanse the area with peroxide, alcohol, or iodine.  When you remove an adhesive bandage, be sure not to damage the skin around it.  If you have a wound, look at it several times a day to make sure it is healing.  Do not use heating pads or hot water bottles. They may burn your skin. If you have lost feeling in your feet or legs, you may not know it is happening until it is too late.  Make sure your health care provider performs a complete foot exam at least annually or more often if you have foot problems. Report any cuts, sores, or bruises to your health care provider immediately. SEEK MEDICAL CARE IF:   You have an injury that is not healing.  You have cuts or breaks in the skin.  You have an ingrown nail.  You notice redness on your legs or feet.  You feel burning or tingling in your legs or feet.  You have pain or cramps in your legs and feet.  Your legs or feet are numb.  Your feet always feel cold. SEEK IMMEDIATE MEDICAL CARE IF:   There is increasing redness,   swelling, or pain in or around a wound.  There is a red line that goes up your leg.  Pus is coming from a wound.  You develop a fever or as directed by your health care provider.  You notice a bad smell coming from an ulcer or wound.   This information is not intended to replace advice given to you by your health care provider. Make sure you discuss any questions you have with your health care provider.   Document Released: 06/27/2000 Document Revised: 03/02/2013 Document Reviewed: 12/07/2012 Elsevier Interactive Patient Education 2016 [employer]  

## 2016-01-17 NOTE — Progress Notes (Signed)
Patient ID: [name], male   DOB: May 08, 1946, 70 y.o.   MRN: [id number]  Subjective: This patient presents today for scheduled visit complaining of thickened and elongated toenails and requests toenail debridement  Objective: Orientated 3  Vascular: Bilateral peripheral edema DP pulse right 2/4 and 1/4 left PT pulses 2/4 bilaterally Capillary reflex immediate bilaterally  Neurological: Sensation to 10 g monofilament wire intact 2/5 right and 1/5 left Vibratory sensation nonreactive bilaterally Ankle reflexes trace reactive bilaterally  Dermatological: No open skin lesions bilaterally Brown hyperpigmentation lower legs and dorsal feet bilaterally Toenails are yellow, brittle, deformed 6-10  Musculoskeletal: Hallux malleus right Manual motor testing: Dorsi flexion, plantar flexion, inversion, eversion 5/5 bilaterally  Assessment: Venous reflux disease bilaterally Satisfactory arterial status, bilaterally 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

## 2016-01-25 ENCOUNTER — Ambulatory Visit (INDEPENDENT_AMBULATORY_CARE_PROVIDER_SITE_OTHER): Payer: Medicare Other | Admitting: Pharmacist Clinician (PhC)/ Clinical Pharmacy Specialist

## 2016-01-25 DIAGNOSIS — Z7901 Long term (current) use of anticoagulants: Secondary | ICD-10-CM | POA: Diagnosis not present

## 2016-01-25 DIAGNOSIS — I480 Paroxysmal atrial fibrillation: Secondary | ICD-10-CM

## 2016-01-25 DIAGNOSIS — Z5181 Encounter for therapeutic drug level monitoring: Secondary | ICD-10-CM | POA: Diagnosis not present

## 2016-01-25 LAB — POCT INR: INR: 2.1

## 2016-01-30 ENCOUNTER — Other Ambulatory Visit: Payer: Self-pay | Admitting: Emergency Medicine

## 2016-02-20 ENCOUNTER — Ambulatory Visit (INDEPENDENT_AMBULATORY_CARE_PROVIDER_SITE_OTHER): Payer: Medicare Other | Admitting: Pharmacist

## 2016-02-20 DIAGNOSIS — Z5181 Encounter for therapeutic drug level monitoring: Secondary | ICD-10-CM | POA: Diagnosis not present

## 2016-02-20 DIAGNOSIS — Z7901 Long term (current) use of anticoagulants: Secondary | ICD-10-CM

## 2016-02-20 DIAGNOSIS — I480 Paroxysmal atrial fibrillation: Secondary | ICD-10-CM

## 2016-02-20 LAB — POCT INR: INR: 2.1

## 2016-03-31 ENCOUNTER — Other Ambulatory Visit: Payer: Self-pay | Admitting: Cardiology

## 2016-04-08 ENCOUNTER — Ambulatory Visit (INDEPENDENT_AMBULATORY_CARE_PROVIDER_SITE_OTHER): Payer: Medicare Other | Admitting: Pharmacist

## 2016-04-08 DIAGNOSIS — Z7901 Long term (current) use of anticoagulants: Secondary | ICD-10-CM | POA: Diagnosis not present

## 2016-04-08 DIAGNOSIS — I480 Paroxysmal atrial fibrillation: Secondary | ICD-10-CM | POA: Diagnosis not present

## 2016-04-08 DIAGNOSIS — Z5181 Encounter for therapeutic drug level monitoring: Secondary | ICD-10-CM | POA: Diagnosis not present

## 2016-04-08 LAB — POCT INR: INR: 1.9

## 2016-04-16 ENCOUNTER — Ambulatory Visit (INDEPENDENT_AMBULATORY_CARE_PROVIDER_SITE_OTHER): Payer: Medicare Other | Admitting: Podiatry

## 2016-04-16 ENCOUNTER — Encounter: Payer: Self-pay | Admitting: Podiatry

## 2016-04-16 VITALS — BP 114/55 | HR 63 | Resp 18

## 2016-04-16 DIAGNOSIS — E1142 Type 2 diabetes mellitus with diabetic polyneuropathy: Secondary | ICD-10-CM

## 2016-04-16 DIAGNOSIS — B351 Tinea unguium: Secondary | ICD-10-CM

## 2016-04-16 DIAGNOSIS — M79676 Pain in unspecified toe(s): Secondary | ICD-10-CM | POA: Diagnosis not present

## 2016-04-16 NOTE — Patient Instructions (Signed)
Diabetes and Foot Care Diabetes may cause you to have problems because of poor blood supply (circulation) to your feet and legs. This may cause the skin on your feet to become thinner, break easier, and heal more slowly. Your skin may become dry, and the skin may peel and crack. You may also have nerve damage in your legs and feet causing decreased feeling in them. You may not notice minor injuries to your feet that could lead to infections or more serious problems. Taking care of your feet is one of the most important things you can do for yourself.  HOME CARE INSTRUCTIONS  Wear shoes at all times, even in the house. Do not go barefoot. Bare feet are easily injured.  Check your feet daily for blisters, cuts, and redness. If you cannot see the bottom of your feet, use a mirror or ask someone for help.  Wash your feet with warm water (do not use hot water) and mild soap. Then pat your feet and the areas between your toes until they are completely dry. Do not soak your feet as this can dry your skin.  Apply a moisturizing lotion or petroleum jelly (that does not contain alcohol and is unscented) to the skin on your feet and to dry, brittle toenails. Do not apply lotion between your toes.  Trim your toenails straight across. Do not dig under them or around the cuticle. File the edges of your nails with an emery board or nail file.  Do not cut corns or calluses or try to remove them with medicine.  Wear clean socks or stockings every day. Make sure they are not too tight. Do not wear knee-high stockings since they may decrease blood flow to your legs.  Wear shoes that fit properly and have enough cushioning. To break in new shoes, wear them for just a few hours a day. This prevents you from injuring your feet. Always look in your shoes before you put them on to be sure there are no objects inside.  Do not cross your legs. This may decrease the blood flow to your feet.  If you find a minor scrape,  cut, or break in the skin on your feet, keep it and the skin around it clean and dry. These areas may be cleansed with mild soap and water. Do not cleanse the area with peroxide, alcohol, or iodine.  When you remove an adhesive bandage, be sure not to damage the skin around it.  If you have a wound, look at it several times a day to make sure it is healing.  Do not use heating pads or hot water bottles. They may burn your skin. If you have lost feeling in your feet or legs, you may not know it is happening until it is too late.  Make sure your health care provider performs a complete foot exam at least annually or more often if you have foot problems. Report any cuts, sores, or bruises to your health care provider immediately. SEEK MEDICAL CARE IF:   You have an injury that is not healing.  You have cuts or breaks in the skin.  You have an ingrown nail.  You notice redness on your legs or feet.  You feel burning or tingling in your legs or feet.  You have pain or cramps in your legs and feet.  Your legs or feet are numb.  Your feet always feel cold. SEEK IMMEDIATE MEDICAL CARE IF:   There is increasing redness,   swelling, or pain in or around a wound.  There is a red line that goes up your leg.  Pus is coming from a wound.  You develop a fever or as directed by your health care provider.  You notice a bad smell coming from an ulcer or wound.   This information is not intended to replace advice given to you by your health care provider. Make sure you discuss any questions you have with your health care provider.   Document Released: 06/27/2000 Document Revised: 03/02/2013 Document Reviewed: 12/07/2012 Elsevier Interactive Patient Education 2016 [employer]  

## 2016-04-17 NOTE — Progress Notes (Signed)
Patient ID: LINKEN MCGLOTHEN, male   DOB: 09-Feb-1946, 70 y.o.   MRN: [id number]    Subjective: This patient presents today for scheduled visit complaining of thickened and elongated toenails and requests toenail debridement  Objective: Orientated 3  Vascular: Bilateral peripheral edema DP pulse right 2/4 and 1/4 left PT pulses 2/4 bilaterally Capillary reflex immediate bilaterally  Neurological: Sensation to 10 g monofilament wire intact 2/5 right and 1/5 left Vibratory sensation nonreactive bilaterally Ankle reflexes trace reactive bilaterally  Dermatological: No open skin lesions bilaterally Brown hyperpigmentation lower legs and dorsal feet bilaterally Toenails are yellow, brittle, deformed 6-10  Musculoskeletal: Hallux malleus right Manual motor testing: Dorsi flexion, plantar flexion, inversion, eversion 5/5 bilaterally  Assessment: Venous reflux disease bilaterally Satisfactory arterial status, bilaterally 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

## 2016-05-20 ENCOUNTER — Ambulatory Visit (INDEPENDENT_AMBULATORY_CARE_PROVIDER_SITE_OTHER): Payer: Medicare Other | Admitting: Pharmacist

## 2016-05-20 DIAGNOSIS — Z5181 Encounter for therapeutic drug level monitoring: Secondary | ICD-10-CM | POA: Diagnosis not present

## 2016-05-20 DIAGNOSIS — Z7901 Long term (current) use of anticoagulants: Secondary | ICD-10-CM

## 2016-05-20 DIAGNOSIS — I480 Paroxysmal atrial fibrillation: Secondary | ICD-10-CM | POA: Diagnosis not present

## 2016-05-20 LAB — POCT INR: INR: 2.3

## 2016-06-12 ENCOUNTER — Encounter: Payer: Self-pay | Admitting: Emergency Medicine

## 2016-06-12 ENCOUNTER — Ambulatory Visit (INDEPENDENT_AMBULATORY_CARE_PROVIDER_SITE_OTHER): Payer: Medicare Other | Admitting: Emergency Medicine

## 2016-06-12 ENCOUNTER — Ambulatory Visit: Payer: Medicare Other | Admitting: Emergency Medicine

## 2016-06-12 VITALS — BP 114/60 | HR 60 | Ht >= 80 in | Wt 353.0 lb

## 2016-06-12 DIAGNOSIS — M542 Cervicalgia: Secondary | ICD-10-CM

## 2016-06-12 DIAGNOSIS — Z0120 Encounter for dental examination and cleaning without abnormal findings: Secondary | ICD-10-CM

## 2016-06-12 DIAGNOSIS — Z23 Encounter for immunization: Secondary | ICD-10-CM

## 2016-06-12 DIAGNOSIS — R040 Epistaxis: Secondary | ICD-10-CM | POA: Diagnosis not present

## 2016-06-12 DIAGNOSIS — J449 Chronic obstructive pulmonary disease, unspecified: Secondary | ICD-10-CM | POA: Diagnosis not present

## 2016-06-12 NOTE — Assessment & Plan Note (Signed)
ENT and dental referrals made as above

## 2016-06-12 NOTE — Patient Instructions (Addendum)
We will continue Anoro daily.  We will refer you to see ENT regarding your nose bleeding and L neck pain.  We will refer you to a dentist as well to have xrays and an oral exam done.  Use albuterol 2 puffs as needed Flu shot today.  Follow with Dr Delton Coombes in 6 months or sooner if you have any problems

## 2016-06-12 NOTE — Assessment & Plan Note (Addendum)
Progressive epistaxis on coumadin. He will need to be seen by ENT. Also with pain that extends down into his L neck, etiology unclear. May need imaging - I will defer this to ENT. Will also have him see dentistry to insure not dental or a mandibular problem,

## 2016-06-12 NOTE — Progress Notes (Signed)
  Subjective:    Patient ID: [name], male    DOB: [birth year], 70 y.o.   MRN: [id number]   HPI 70 yo man, hx tobacco, Hx RA, followed by Dr [place] for HTN, A Fib. OSA on CPAP.  He underwent CPAP titration since last time - recommended BiPAP 18/14 vs CPAP 18 (currently on 14). He wants to change from Spiriva to scheduled Nebs.    ROV 11/22/14 -- follow-up visit for COPD and obstructive sleep apnea on CPAP. He still hasn't had any maintenance, tubing, mask care etc. He may need a repeat PSG / titration to qualify, will need to check. Tolerating Anoro, doing well.   ROV 06/12/15 -- follow-up visit for COPD, obstructive sleep apnea on CPAP. Believes that he is benefiting significantly from the Anoro. He was able to get new CPAP supplies, wears it reliably. He is having some imbalance, no recent falls. He has lost 40 lbs - has probably helped his breathing.   ROV 12/11/15 -- patient with a history of obstructive sleep apnea on CPAP, COPD. He has been doing well although he knows that he needs to lose more weight. He is having exertional SOB after about 100 ft.  He is on Anoro daily, feels that he benefits from it. Does not use albuterol.  He is reliable with CPAP, feels that he benefits. Good sleep, feels refreshed in the am. Good compliance. Uses nasal mask,  mask and equipment in good repair. No leakage.   ROV 06/12/16 -- This is a follow-up visit for history of obstructive sleep apnea on CPAP with good compliance. He also has COPD, early managed on Anoro. He is having a lot of trouble starting slowly about a year ago with epistaxis mainly from L nostril, pain in his L jaw (? A tooth) into the left next. His breathing has been stable. No AE since last time.    Objective:   Physical Exam Vitals:   06/12/16 1047  BP: 114/60  BP Location: Right Arm  Cuff Size: Large  Pulse: 60  SpO2: 97%  Weight: (!) 353 lb (160.1 kg)  Height: 6\' 8"  (2.032 m)   Gen: Pleasant, obese, in no distress,  normal  affect  ENT: No lesions,  mouth clear,  oropharynx clear, no postnasal drip  Neck: No JVD, no TMG, no carotid bruits  Lungs: No use of accessory muscles, no wheeze on forced exp  Cardiovascular: RRR, systolic M, B compression stockings in place.   Musculoskeletal: No deformities, no cyanosis or clubbing  Neuro: alert, non focal  Skin: Warm, no lesions or rashes   Assessment & Plan:  COPD (chronic obstructive pulmonary disease) He has done quite well since beginning Anoro. No exacerbations. He rarely uses albuterol. We will continue this regimen. He needs the flu shot today  Epistaxis Progressive epistaxis on coumadin. He will need to be seen by ENT. Also with pain that extends down into his L neck, etiology unclear. May need imaging - I will defer this to ENT. Will also have him see dentistry to insure not dental or a mandibular problem,   Neck pain on left side ENT and dental referrals made as above  , MD, PhD 06/12/2016, 11:09 AM [facility] [phone] or if no answer [phone]

## 2016-06-12 NOTE — Assessment & Plan Note (Signed)
He has done quite well since beginning Anoro. No exacerbations. He rarely uses albuterol. We will continue this regimen. He needs the flu shot today

## 2016-07-02 ENCOUNTER — Telehealth: Payer: Self-pay

## 2016-07-02 NOTE — Telephone Encounter (Signed)
Spoke to W[place] with Dr.Kaplan's office,requesting a letter stating patient does not need antibiotics before dental procedures.Letter faxed to fax # [phone].

## 2016-07-06 NOTE — Progress Notes (Signed)
History of Present Illness: [name] is seen today for follow up of atrial fibrillation. He has a history of recurrent paroxysmal atrial fibrillation. He has been on chronic amiodarone therapy since September of 2011. He states this has worked very well for him and he has had very infrequent episodes of arrhythmia. This now is occuring occasionally but lasts less than 1 minute.   He also has a history of chronic chest pain with extensive evaluation in the past including cardiac cath, CT, and stress tests. Last stress Myoview was in 2012. He has a history of chronic venous stasis disease and had prior vein stripping by Dr. Hart [name].  On follow up today he reports he is doing well and rhythm has been well controlled. He states it will go out but only for 3-4 minutes.  He also notes an occasional electric shock sensation in his chest lasting only 2 seconds. It feels like a sudden shock.   Current Outpatient Prescriptions on File Prior to Visit  Medication Sig Dispense Refill  . albuterol (PROAIR HFA) 108 (90 Base) MCG/ACT inhaler INHALE 2 PUFFS INTO THE LUNGS EVERY 4 (FOUR) HOURS AS NEEDED FOR WHEEZING OR SHORTNESS OF BREATH. 8.5 g 5  . amiodarone (PACERONE) 200 MG tablet Take 1 tablet by mouth  daily 90 tablet 3  . ANORO ELLIPTA 62.5-25 MCG/INH AEPB INHALE 1 PUFF INTO THE LUNGS DAILY. 60 each 5  . atenolol (TENORMIN) 50 MG tablet Take 1 tablet (50 mg total) by mouth 2 (two) times daily. 180 tablet 3  . diltiazem (CARDIZEM CD) 180 MG 24 hr capsule Take 1 capsule (180 mg total) by mouth daily. 90 capsule 3  . furosemide (LASIX) 40 MG tablet Take 1 tablet (40 mg total) by mouth daily. 90 tablet 3  . glucose blood (FREESTYLE LITE) test strip 2 strips by Does not apply route daily.    . hydrochlorothiazide (HYDRODIURIL) 25 MG tablet Take 1 tablet (25 mg total) by mouth daily. 90 tablet 3  . HYDROcodone-acetaminophen (NORCO/VICODIN) 5-325 MG per tablet Take 1 tablet by mouth every 6 (six) hours as needed for  moderate pain.     . metFORMIN (GLUCOPHAGE-XR) 500 MG 24 hr tablet Take 500 mg by mouth 2 (two) times daily.     Marland Kitchen omeprazole (PRILOSEC) 20 MG capsule Take 1 capsule (20 mg total) by mouth daily. 90 capsule 3  . ONETOUCH DELICA LANCETS 33G MISC Inject 2 Devices as directed daily.    . potassium chloride SA (KLOR-CON M20) 20 MEQ tablet Take 1 tablet (20 mEq total) by mouth 2 (two) times daily. 180 tablet 3  . spironolactone (ALDACTONE) 25 MG tablet Take 1 tablet (25 mg total) by mouth daily. 90 tablet 3  . warfarin (COUMADIN) 5 MG tablet TAKE 1 TABLET BY MOUTH DAILY OR AS DIRECTED BY COUMADIN CLINIC 30 tablet 3   No current facility-administered medications on file prior to visit.     No Known Allergies  Past Medical History:  Diagnosis Date  . Chronic anticoagulation   . Chronic back pain   . Chronic chest pain   . Chronic venous insufficiency   . Emphysema   . HTN (hypertension)   . Obesity   . PAF (paroxysmal atrial fibrillation) (HCC)   . Pneumothorax   . Rheumatoid arthritis(714.0)     Past Surgical History:  Procedure Laterality Date  . ablation    . CARDIAC CATHETERIZATION  1998   NORMAL  . HERNIA REPAIR    . PLEURAL SCARIFICATION    .  VEIN LIGATION AND STRIPPING      History  Smoking Status  . Former Smoker  . Packs/day: 2.00  . Years: 30.00  . Types: Cigarettes  . Quit date: 03/14/1996  Smokeless Tobacco  . Never Used    History  Alcohol Use  . Yes    Comment: one beer per month    Family History  Problem Relation Age of Onset  . Other Father     CEREBRAL THROMBOSIS  . Cancer Mother     bone cancer  . Rheum arthritis Mother   . Heart failure Brother 50  . Coronary artery disease Brother 26    PTCA  . Rheum arthritis Brother   . Rheum arthritis Brother     Review of Systems: As noted in history of present illness. All other systems were reviewed and are negative.  Physical Exam: BP (!) 117/56 (BP Location: Right Arm)   Pulse 60   Ht 6\' 8"   (2.032 m)   Wt (!) 343 lb 12.8 oz (155.9 kg)   BMI 37.77 kg/m  He is a pleasant, obese white male. He is in no acute distress. HEENT is unremarkable.Neck is supple. No masses.  Lungs are clear. Cardiac exam shows a regular rate and rhythm. No gallop or murmur. Abdomen is obese. There are no masses or tenderness to palpation. Extremities are full. He has chronic venous stasis disease with chronic brawny edema. Gait and ROM are intact. He has no gross neurologic deficits.  Laboratory data:   Lab Results  Component Value Date   WBC 16.9 (H) 09/24/2015   HGB 15.3 09/24/2015   HCT 46.0 09/24/2015   PLT 101 (L) 09/24/2015   GLUCOSE 152 (H) 09/24/2015   CHOL 163 09/24/2015   TRIG 174 (H) 09/24/2015   HDL 29 (L) 09/24/2015   LDLCALC 99 09/24/2015   ALT 16 09/24/2015   AST 20 09/24/2015   NA 140 09/24/2015   K 4.0 09/24/2015   CL 102 09/24/2015   CREATININE 1.00 09/24/2015   BUN 19 09/24/2015   CO2 26 09/24/2015   TSH 3.51 09/24/2015   INR 2.3 05/20/2016   HGBA1C 7.1 (H) 09/21/2013    Reviewed from primary care dated 04/08/16: Hgb 12.2, WBC 16.1. A1c 7.2%. Glucose 157. Other CMET normal.   INR today 1.5  Ecg today shows NSR rate 60. LAFB. Poor R wave progression. No change. I have personally reviewed and interpreted this study.     Assessment / Plan: 1. Paroxysmal atrial fibrillation. This is well controlled on amiodarone. We will continue amiodarone 100 mg daily and atenolol.   2. Anticoagulation. Subtherapeutic today. Missed some doses for oral surgery. Resume  Coumadin per pharmacy. He is interested in a NOAC. Will give him Rx for Xarelto to check pharmacy price. Will need to talk with pharmacy to transition.   3. Morbid obesity with sleep apnea. Continue efforts at weight loss. He has lost 10 lbs!  4. Chronic venous insufficiency with severe chronic edema. Legs actually less swollen today.  Continue diuretic therapy with Lasix, HCTZ, and Aldactone. Continue potassium  supplementation.   5. COPD/emphysema.   6. Chronic atypical chest pain. Doing well.   I will see in 6 months.

## 2016-07-10 ENCOUNTER — Ambulatory Visit (INDEPENDENT_AMBULATORY_CARE_PROVIDER_SITE_OTHER): Payer: Medicare Other | Admitting: Pharmacist

## 2016-07-10 ENCOUNTER — Encounter: Payer: Self-pay | Admitting: Cardiology

## 2016-07-10 ENCOUNTER — Ambulatory Visit (INDEPENDENT_AMBULATORY_CARE_PROVIDER_SITE_OTHER): Payer: Medicare Other | Admitting: Cardiology

## 2016-07-10 VITALS — BP 117/56 | HR 60 | Ht >= 80 in | Wt 343.8 lb

## 2016-07-10 DIAGNOSIS — G8929 Other chronic pain: Secondary | ICD-10-CM | POA: Diagnosis not present

## 2016-07-10 DIAGNOSIS — I480 Paroxysmal atrial fibrillation: Secondary | ICD-10-CM

## 2016-07-10 DIAGNOSIS — R079 Chest pain, unspecified: Secondary | ICD-10-CM

## 2016-07-10 DIAGNOSIS — Z5181 Encounter for therapeutic drug level monitoring: Secondary | ICD-10-CM

## 2016-07-10 DIAGNOSIS — Z7901 Long term (current) use of anticoagulants: Secondary | ICD-10-CM | POA: Diagnosis not present

## 2016-07-10 LAB — POCT INR: INR: 1.5

## 2016-07-10 MED ORDER — RIVAROXABAN 20 MG PO TABS: 20.0000 mg | ORAL_TABLET | Freq: Every day | ORAL | 6 refills | Status: DC

## 2016-07-10 NOTE — Patient Instructions (Signed)
Continue your current therapy  We can try Xarelto 20 mg daily instead of Coumadin  I will see you in 6 months

## 2016-07-16 ENCOUNTER — Encounter: Payer: Self-pay | Admitting: Podiatry

## 2016-07-16 ENCOUNTER — Ambulatory Visit (INDEPENDENT_AMBULATORY_CARE_PROVIDER_SITE_OTHER): Payer: Medicare Other | Admitting: Podiatry

## 2016-07-16 VITALS — BP 133/48 | HR 74 | Resp 18

## 2016-07-16 DIAGNOSIS — E1142 Type 2 diabetes mellitus with diabetic polyneuropathy: Secondary | ICD-10-CM

## 2016-07-16 DIAGNOSIS — B351 Tinea unguium: Secondary | ICD-10-CM

## 2016-07-16 NOTE — Progress Notes (Signed)
Patient ID: [name], male   DOB: 1946-07-06, 71 y.o.   MRN: [id number]    Subjective: This patient presents today for scheduled visit complaining of thickened and elongated toenails and requests toenail debridement  Objective: Orientated 3  Vascular: Bilateral peripheral edema DP pulse right 2/4 and 1/4 left PT pulses 2/4 bilaterally Capillary reflex immediate bilaterally  Neurological: Sensation to 10 g monofilament wire intact 2/5 right and 1/5 left Vibratory sensation nonreactive bilaterally Ankle reflexes trace reactive bilaterally  Dermatological: No open skin lesions bilaterally Brown hyperpigmentation lower legs and dorsal feet bilaterally Toenails are yellow, brittle, deformed 6-10 10 mm nonraised area of erythema dorsal medial right foot. 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 Instructed patient that if he knows any sudden increase of redness, pain, swelling, redness, fever contact your office or present to the emergency department   Reappoint 3 months

## 2016-07-16 NOTE — Patient Instructions (Signed)
Today there was a small area on the your right foot that was mildly red a you recently noticed. Apply topical antibiotic ointment and Band-Aid to this area daily the area continues become larger, more swollen immediately present to our office or contact urgent care or ED  Diabetes and Foot Care Diabetes may cause you to have problems because of poor blood supply (circulation) to your feet and legs. This may cause the skin on your feet to become thinner, break easier, and heal more slowly. Your skin may become dry, and the skin may peel and crack. You may also have nerve damage in your legs and feet causing decreased feeling in them. You may not notice minor injuries to your feet that could lead to infections or more serious problems. Taking care of your feet is one of the most important things you can do for yourself. Follow these instructions at home:  Wear shoes at all times, even in the house. Do not go barefoot. Bare feet are easily injured.  Check your feet daily for blisters, cuts, and redness. If you cannot see the bottom of your feet, use a mirror or ask someone for help.  Wash your feet with warm water (do not use hot water) and mild soap. Then pat your feet and the areas between your toes until they are completely dry. Do not soak your feet as this can dry your skin.  Apply a moisturizing lotion or petroleum jelly (that does not contain alcohol and is unscented) to the skin on your feet and to dry, brittle toenails. Do not apply lotion between your toes.  Trim your toenails straight across. Do not dig under them or around the cuticle. File the edges of your nails with an emery board or nail file.  Do not cut corns or calluses or try to remove them with medicine.  Wear clean socks or stockings every day. Make sure they are not too tight. Do not wear knee-high stockings since they may decrease blood flow to your legs.  Wear shoes that fit properly and have enough cushioning. To break in  new shoes, wear them for just a few hours a day. This prevents you from injuring your feet. Always look in your shoes before you put them on to be sure there are no objects inside.  Do not cross your legs. This may decrease the blood flow to your feet.  If you find a minor scrape, cut, or break in the skin on your feet, keep it and the skin around it clean and dry. These areas may be cleansed with mild soap and water. Do not cleanse the area with peroxide, alcohol, or iodine.  When you remove an adhesive bandage, be sure not to damage the skin around it.  If you have a wound, look at it several times a day to make sure it is healing.  Do not use heating pads or hot water bottles. They may burn your skin. If you have lost feeling in your feet or legs, you may not know it is happening until it is too late.  Make sure your health care provider performs a complete foot exam at least annually or more often if you have foot problems. Report any cuts, sores, or bruises to your health care provider immediately. Contact a health care provider if:  You have an injury that is not healing.  You have cuts or breaks in the skin.  You have an ingrown nail.  You notice redness on  your legs or feet.  You feel burning or tingling in your legs or feet.  You have pain or cramps in your legs and feet.  Your legs or feet are numb.  Your feet always feel cold. Get help right away if:  There is increasing redness, swelling, or pain in or around a wound.  There is a red line that goes up your leg.  Pus is coming from a wound.  You develop a fever or as directed by your health care provider.  You notice a bad smell coming from an ulcer or wound. This information is not intended to replace advice given to you by your health care provider. Make sure you discuss any questions you have with your health care provider. Document Released: 06/27/2000 Document Revised: 12/06/2015 Document Reviewed:  12/07/2012 Elsevier Interactive Patient Education  2017 [employer].

## 2016-08-05 ENCOUNTER — Ambulatory Visit (INDEPENDENT_AMBULATORY_CARE_PROVIDER_SITE_OTHER): Payer: Medicare Other | Admitting: Pharmacist Clinician (PhC)/ Clinical Pharmacy Specialist

## 2016-08-05 DIAGNOSIS — I480 Paroxysmal atrial fibrillation: Secondary | ICD-10-CM | POA: Diagnosis not present

## 2016-08-05 DIAGNOSIS — Z5181 Encounter for therapeutic drug level monitoring: Secondary | ICD-10-CM

## 2016-08-05 DIAGNOSIS — Z7901 Long term (current) use of anticoagulants: Secondary | ICD-10-CM

## 2016-08-05 LAB — POCT INR: INR: 1.4

## 2016-08-20 ENCOUNTER — Encounter (HOSPITAL_COMMUNITY): Payer: Self-pay

## 2016-08-20 DIAGNOSIS — E119 Type 2 diabetes mellitus without complications: Secondary | ICD-10-CM | POA: Diagnosis present

## 2016-08-20 DIAGNOSIS — I872 Venous insufficiency (chronic) (peripheral): Secondary | ICD-10-CM | POA: Diagnosis present

## 2016-08-20 DIAGNOSIS — D72829 Elevated white blood cell count, unspecified: Secondary | ICD-10-CM | POA: Diagnosis present

## 2016-08-20 DIAGNOSIS — Z87891 Personal history of nicotine dependence: Secondary | ICD-10-CM

## 2016-08-20 DIAGNOSIS — K219 Gastro-esophageal reflux disease without esophagitis: Secondary | ICD-10-CM | POA: Diagnosis present

## 2016-08-20 DIAGNOSIS — K640 First degree hemorrhoids: Principal | ICD-10-CM | POA: Diagnosis present

## 2016-08-20 DIAGNOSIS — I209 Angina pectoris, unspecified: Secondary | ICD-10-CM | POA: Diagnosis present

## 2016-08-20 DIAGNOSIS — K921 Melena: Secondary | ICD-10-CM | POA: Diagnosis not present

## 2016-08-20 DIAGNOSIS — I481 Persistent atrial fibrillation: Secondary | ICD-10-CM | POA: Diagnosis present

## 2016-08-20 DIAGNOSIS — D509 Iron deficiency anemia, unspecified: Secondary | ICD-10-CM | POA: Diagnosis present

## 2016-08-20 DIAGNOSIS — K5730 Diverticulosis of large intestine without perforation or abscess without bleeding: Secondary | ICD-10-CM | POA: Diagnosis present

## 2016-08-20 DIAGNOSIS — Z6839 Body mass index (BMI) 39.0-39.9, adult: Secondary | ICD-10-CM

## 2016-08-20 DIAGNOSIS — I5032 Chronic diastolic (congestive) heart failure: Secondary | ICD-10-CM | POA: Diagnosis present

## 2016-08-20 DIAGNOSIS — Z808 Family history of malignant neoplasm of other organs or systems: Secondary | ICD-10-CM

## 2016-08-20 DIAGNOSIS — I110 Hypertensive heart disease with heart failure: Secondary | ICD-10-CM | POA: Diagnosis present

## 2016-08-20 DIAGNOSIS — D62 Acute posthemorrhagic anemia: Secondary | ICD-10-CM | POA: Diagnosis present

## 2016-08-20 DIAGNOSIS — Z8249 Family history of ischemic heart disease and other diseases of the circulatory system: Secondary | ICD-10-CM

## 2016-08-20 DIAGNOSIS — D696 Thrombocytopenia, unspecified: Secondary | ICD-10-CM | POA: Diagnosis present

## 2016-08-20 DIAGNOSIS — Z7984 Long term (current) use of oral hypoglycemic drugs: Secondary | ICD-10-CM

## 2016-08-20 DIAGNOSIS — G4733 Obstructive sleep apnea (adult) (pediatric): Secondary | ICD-10-CM | POA: Diagnosis present

## 2016-08-20 DIAGNOSIS — Z7901 Long term (current) use of anticoagulants: Secondary | ICD-10-CM

## 2016-08-20 DIAGNOSIS — M069 Rheumatoid arthritis, unspecified: Secondary | ICD-10-CM | POA: Diagnosis present

## 2016-08-20 LAB — COMPREHENSIVE METABOLIC PANEL
ALBUMIN: 3.8 g/dL (ref 3.5–5.0)
ALT: 14 U/L — ABNORMAL LOW (ref 17–63)
AST: 18 U/L (ref 15–41)
Alkaline Phosphatase: 58 U/L (ref 38–126)
Anion gap: 9 (ref 5–15)
BUN: 22 mg/dL — ABNORMAL HIGH (ref 6–20)
CHLORIDE: 102 mmol/L (ref 101–111)
CO2: 26 mmol/L (ref 22–32)
Calcium: 9.1 mg/dL (ref 8.9–10.3)
Creatinine, Ser: 1.15 mg/dL (ref 0.61–1.24)
GFR calc Af Amer: >60 mL/min (ref >60)
GLUCOSE: 160 mg/dL — AB (ref 65–99)
POTASSIUM: 3.7 mmol/L (ref 3.5–5.1)
SODIUM: 137 mmol/L (ref 135–145)
Total Bilirubin: 0.7 mg/dL (ref 0.3–1.2)
Total Protein: 7.8 g/dL (ref 6.5–8.1)

## 2016-08-20 LAB — TYPE AND SCREEN
ABO/RH(D): A NEG
Antibody Screen: NEGATIVE

## 2016-08-20 LAB — CBC
HEMATOCRIT: 30.2 % — AB (ref 39.0–52.0)
Hemoglobin: 8.8 g/dL — ABNORMAL LOW (ref 13.0–17.0)
MCH: 24 pg — ABNORMAL LOW (ref 26.0–34.0)
MCHC: 29.1 g/dL — ABNORMAL LOW (ref 30.0–36.0)
MCV: 82.3 fL (ref 78.0–100.0)
Platelets: 126 10*3/uL — ABNORMAL LOW (ref 150–400)
RBC: 3.67 MIL/uL — ABNORMAL LOW (ref 4.22–5.81)
RDW: 17.8 % — AB (ref 11.5–15.5)
WBC: 19.4 10*3/uL — AB (ref 4.0–10.5)

## 2016-08-20 LAB — PROTIME-INR
INR: 1.8
Prothrombin Time: 21.1 seconds — ABNORMAL HIGH (ref 11.4–15.2)

## 2016-08-20 NOTE — ED Triage Notes (Signed)
PT WAS SEEN BY HIS PCP TODAY FOR RECTAL BLEEDING. LAB WORK REVEALED WBC 20.7 AND HGB 9.1. PT DENIES PAIN OR RECTAL BLEEDING IN 24HRS.

## 2016-08-21 ENCOUNTER — Inpatient Hospital Stay (HOSPITAL_COMMUNITY)
Admission: EM | Admit: 2016-08-21 | Discharge: 2016-08-25 | DRG: 394 | Disposition: A | Discharge status: Home / Self Care | Payer: Medicare Other | Attending: Internal Medicine | Admitting: Internal Medicine

## 2016-08-21 DIAGNOSIS — I5032 Chronic diastolic (congestive) heart failure: Secondary | ICD-10-CM | POA: Diagnosis present

## 2016-08-21 DIAGNOSIS — Z7901 Long term (current) use of anticoagulants: Secondary | ICD-10-CM

## 2016-08-21 DIAGNOSIS — G4733 Obstructive sleep apnea (adult) (pediatric): Secondary | ICD-10-CM | POA: Diagnosis present

## 2016-08-21 DIAGNOSIS — R079 Chest pain, unspecified: Secondary | ICD-10-CM | POA: Diagnosis not present

## 2016-08-21 DIAGNOSIS — I2089 Other forms of angina pectoris: Secondary | ICD-10-CM

## 2016-08-21 DIAGNOSIS — K922 Gastrointestinal hemorrhage, unspecified: Secondary | ICD-10-CM | POA: Diagnosis not present

## 2016-08-21 DIAGNOSIS — D62 Acute posthemorrhagic anemia: Secondary | ICD-10-CM | POA: Diagnosis present

## 2016-08-21 DIAGNOSIS — I208 Other forms of angina pectoris: Secondary | ICD-10-CM

## 2016-08-21 DIAGNOSIS — K625 Hemorrhage of anus and rectum: Secondary | ICD-10-CM | POA: Diagnosis present

## 2016-08-21 DIAGNOSIS — J449 Chronic obstructive pulmonary disease, unspecified: Secondary | ICD-10-CM | POA: Diagnosis present

## 2016-08-21 DIAGNOSIS — K640 First degree hemorrhoids: Secondary | ICD-10-CM | POA: Diagnosis present

## 2016-08-21 DIAGNOSIS — I209 Angina pectoris, unspecified: Secondary | ICD-10-CM | POA: Diagnosis present

## 2016-08-21 DIAGNOSIS — I10 Essential (primary) hypertension: Secondary | ICD-10-CM | POA: Diagnosis present

## 2016-08-21 DIAGNOSIS — R0602 Shortness of breath: Secondary | ICD-10-CM | POA: Diagnosis not present

## 2016-08-21 DIAGNOSIS — I872 Venous insufficiency (chronic) (peripheral): Secondary | ICD-10-CM | POA: Diagnosis present

## 2016-08-21 DIAGNOSIS — K921 Melena: Secondary | ICD-10-CM | POA: Diagnosis present

## 2016-08-21 DIAGNOSIS — Z87891 Personal history of nicotine dependence: Secondary | ICD-10-CM | POA: Diagnosis not present

## 2016-08-21 DIAGNOSIS — K219 Gastro-esophageal reflux disease without esophagitis: Secondary | ICD-10-CM | POA: Diagnosis present

## 2016-08-21 DIAGNOSIS — Z808 Family history of malignant neoplasm of other organs or systems: Secondary | ICD-10-CM | POA: Diagnosis not present

## 2016-08-21 DIAGNOSIS — Z8249 Family history of ischemic heart disease and other diseases of the circulatory system: Secondary | ICD-10-CM | POA: Diagnosis not present

## 2016-08-21 DIAGNOSIS — E669 Obesity, unspecified: Secondary | ICD-10-CM | POA: Diagnosis present

## 2016-08-21 DIAGNOSIS — K5730 Diverticulosis of large intestine without perforation or abscess without bleeding: Secondary | ICD-10-CM | POA: Diagnosis present

## 2016-08-21 DIAGNOSIS — Z6839 Body mass index (BMI) 39.0-39.9, adult: Secondary | ICD-10-CM | POA: Diagnosis not present

## 2016-08-21 DIAGNOSIS — D72829 Elevated white blood cell count, unspecified: Secondary | ICD-10-CM | POA: Diagnosis present

## 2016-08-21 DIAGNOSIS — E119 Type 2 diabetes mellitus without complications: Secondary | ICD-10-CM | POA: Diagnosis present

## 2016-08-21 DIAGNOSIS — D696 Thrombocytopenia, unspecified: Secondary | ICD-10-CM | POA: Diagnosis present

## 2016-08-21 DIAGNOSIS — I480 Paroxysmal atrial fibrillation: Secondary | ICD-10-CM | POA: Diagnosis not present

## 2016-08-21 DIAGNOSIS — D509 Iron deficiency anemia, unspecified: Secondary | ICD-10-CM | POA: Diagnosis present

## 2016-08-21 DIAGNOSIS — M069 Rheumatoid arthritis, unspecified: Secondary | ICD-10-CM | POA: Diagnosis present

## 2016-08-21 DIAGNOSIS — Z7984 Long term (current) use of oral hypoglycemic drugs: Secondary | ICD-10-CM | POA: Diagnosis not present

## 2016-08-21 DIAGNOSIS — I110 Hypertensive heart disease with heart failure: Secondary | ICD-10-CM | POA: Diagnosis present

## 2016-08-21 DIAGNOSIS — I481 Persistent atrial fibrillation: Secondary | ICD-10-CM | POA: Diagnosis present

## 2016-08-21 DIAGNOSIS — R0782 Intercostal pain: Secondary | ICD-10-CM | POA: Diagnosis not present

## 2016-08-21 LAB — CBC
HCT: 28.4 % — ABNORMAL LOW (ref 39.0–52.0)
Hemoglobin: 8.2 g/dL — ABNORMAL LOW (ref 13.0–17.0)
MCH: 23.8 pg — ABNORMAL LOW (ref 26.0–34.0)
MCHC: 28.9 g/dL — ABNORMAL LOW (ref 30.0–36.0)
MCV: 82.3 fL (ref 78.0–100.0)
PLATELETS: 101 10*3/uL — AB (ref 150–400)
RBC: 3.45 MIL/uL — AB (ref 4.22–5.81)
RDW: 17.9 % — ABNORMAL HIGH (ref 11.5–15.5)
WBC: 18.1 10*3/uL — AB (ref 4.0–10.5)

## 2016-08-21 LAB — FERRITIN: Ferritin: 17 ng/mL — ABNORMAL LOW (ref 24–336)

## 2016-08-21 LAB — CBC WITH DIFFERENTIAL/PLATELET
BASOS ABS: 0.2 10*3/uL — AB (ref 0.0–0.1)
Basophils Relative: 1 %
EOS ABS: 0.5 10*3/uL (ref 0.0–0.7)
Eosinophils Relative: 3 %
HCT: 29 % — ABNORMAL LOW (ref 39.0–52.0)
Hemoglobin: 8.5 g/dL — ABNORMAL LOW (ref 13.0–17.0)
LYMPHS ABS: 3.3 10*3/uL (ref 0.7–4.0)
Lymphocytes Relative: 18 %
MCH: 24.2 pg — AB (ref 26.0–34.0)
MCHC: 29.3 g/dL — AB (ref 30.0–36.0)
MCV: 82.6 fL (ref 78.0–100.0)
MONO ABS: 1.5 10*3/uL — AB (ref 0.1–1.0)
Monocytes Relative: 8 %
NEUTROS PCT: 70 %
Neutro Abs: 12.7 10*3/uL — ABNORMAL HIGH (ref 1.7–7.7)
PLATELETS: 129 10*3/uL — AB (ref 150–400)
RBC: 3.51 MIL/uL — ABNORMAL LOW (ref 4.22–5.81)
RDW: 18.1 % — ABNORMAL HIGH (ref 11.5–15.5)
WBC: 18.2 10*3/uL — AB (ref 4.0–10.5)
nRBC: 1 /100 WBC — ABNORMAL HIGH

## 2016-08-21 LAB — OCCULT BLOOD X 1 CARD TO LAB, STOOL: FECAL OCCULT BLD: POSITIVE — AB

## 2016-08-21 LAB — IRON AND TIBC
Iron: 19 ug/dL — ABNORMAL LOW (ref 45–182)
SATURATION RATIOS: 4 % — AB (ref 17.9–39.5)
TIBC: 503 ug/dL — ABNORMAL HIGH (ref 250–450)
UIBC: 484 ug/dL

## 2016-08-21 LAB — RETICULOCYTES
RBC.: 3.67 MIL/uL — AB (ref 4.22–5.81)
RETIC CT PCT: 4 % — AB (ref 0.4–3.1)
Retic Count, Absolute: 146.8 10*3/uL (ref 19.0–186.0)

## 2016-08-21 LAB — ABO/RH: ABO/RH(D): A NEG

## 2016-08-21 LAB — FOLATE: Folate: 14.2 ng/mL (ref >5.9)

## 2016-08-21 MED ORDER — POTASSIUM CHLORIDE CRYS ER 20 MEQ PO TBCR
20.0000 meq | EXTENDED_RELEASE_TABLET | Freq: Two times a day (BID) | ORAL | Status: DC
Start: 2016-08-21 — End: 2016-08-25
  Administered 2016-08-21 – 2016-08-25 (×10): 20 meq via ORAL
  Filled 2016-08-21 (×10): qty 1

## 2016-08-21 MED ORDER — FUROSEMIDE 40 MG PO TABS
40.0000 mg | ORAL_TABLET | Freq: Every day | ORAL | Status: DC
Start: 2016-08-21 — End: 2016-08-25
  Administered 2016-08-21 – 2016-08-25 (×5): 40 mg via ORAL
  Filled 2016-08-21 (×5): qty 1

## 2016-08-21 MED ORDER — HYDROCHLOROTHIAZIDE 25 MG PO TABS: 25.0000 mg | ORAL_TABLET | Freq: Every day | ORAL | Status: DC

## 2016-08-21 MED ORDER — PANTOPRAZOLE SODIUM 40 MG PO TBEC
40.0000 mg | DELAYED_RELEASE_TABLET | Freq: Every day | ORAL | Status: DC
  Administered 2016-08-21 – 2016-08-25 (×5): 40 mg via ORAL
  Filled 2016-08-21 (×5): qty 1

## 2016-08-21 MED ORDER — ALBUTEROL SULFATE (2.5 MG/3ML) 0.083% IN NEBU
3.0000 mL | INHALATION_SOLUTION | RESPIRATORY_TRACT | Status: DC | PRN
  Administered 2016-08-21: 3 mL via RESPIRATORY_TRACT
  Filled 2016-08-21: qty 3

## 2016-08-21 MED ORDER — HYDROCODONE-ACETAMINOPHEN 5-325 MG PO TABS
1.0000 | ORAL_TABLET | Freq: Four times a day (QID) | ORAL | Status: DC | PRN
  Administered 2016-08-23 – 2016-08-25 (×5): 1 via ORAL
  Filled 2016-08-21 (×6): qty 1

## 2016-08-21 MED ORDER — AMIODARONE HCL 200 MG PO TABS
200.0000 mg | ORAL_TABLET | Freq: Every day | ORAL | Status: DC
Start: 2016-08-21 — End: 2016-08-25
  Administered 2016-08-21 – 2016-08-25 (×5): 200 mg via ORAL
  Filled 2016-08-21 (×5): qty 1

## 2016-08-21 MED ORDER — SODIUM CHLORIDE 0.9% FLUSH
3.0000 mL | Freq: Two times a day (BID) | INTRAVENOUS | Status: DC
  Administered 2016-08-21 – 2016-08-25 (×10): 3 mL via INTRAVENOUS

## 2016-08-21 MED ORDER — SPIRONOLACTONE 25 MG PO TABS: 25.0000 mg | ORAL_TABLET | Freq: Every day | ORAL | Status: DC

## 2016-08-21 MED ORDER — METFORMIN HCL ER 500 MG PO TB24
500.0000 mg | ORAL_TABLET | Freq: Two times a day (BID) | ORAL | Status: DC
  Administered 2016-08-21: 500 mg via ORAL
  Filled 2016-08-21 (×3): qty 1

## 2016-08-21 MED ORDER — METFORMIN HCL ER 500 MG PO TB24
500.0000 mg | ORAL_TABLET | Freq: Two times a day (BID) | ORAL | Status: DC
  Filled 2016-08-21 (×2): qty 1

## 2016-08-21 MED ORDER — ATENOLOL 50 MG PO TABS
50.0000 mg | ORAL_TABLET | Freq: Two times a day (BID) | ORAL | Status: DC
  Administered 2016-08-21 – 2016-08-23 (×5): 50 mg via ORAL
  Filled 2016-08-21 (×6): qty 1

## 2016-08-21 MED ORDER — DILTIAZEM HCL ER COATED BEADS 180 MG PO CP24
180.0000 mg | ORAL_CAPSULE | Freq: Every day | ORAL | Status: DC
  Administered 2016-08-21 – 2016-08-25 (×5): 180 mg via ORAL
  Filled 2016-08-21 (×5): qty 1

## 2016-08-21 MED ORDER — UMECLIDINIUM-VILANTEROL 62.5-25 MCG/INH IN AEPB
1.0000 | INHALATION_SPRAY | Freq: Every day | RESPIRATORY_TRACT | Status: DC
  Administered 2016-08-21 – 2016-08-25 (×4): 1 via RESPIRATORY_TRACT
  Filled 2016-08-21: qty 14

## 2016-08-21 NOTE — H&P (Signed)
History and Physical    [name] TGG:[id number] DOB: Nov 27, 1945 DOA: 08/21/2016   PCP: Fraser Din, PA-C Chief Complaint:  Chief Complaint  Patient presents with  . Abnormal Lab    HPI: [name] is a 71 y.o. male with medical history significant of A.Fib, patient was recently changed from coumadin to Xarelto.  4 days ago he developed BRBPR.  This went on for 4 days before resolving 24 hours ago, last 2 BMs have been normal.  He went to PCPs office yesterday afternoon for this and had labs drawn.  WBC was 20.7k and HGB 9.1.  He was called and informed of the results about 4 hours ago and told to come in to the ED as his baseline HGB was over 15 a year ago.  ED Course: HGB 8.8.  Denies fever, nausea, vomiting, lightheadedness, SOB, cough, chest pain, abd pain.  Review of Systems: As per HPI otherwise 10 point review of systems negative.    Past Medical History:  Diagnosis Date  . Chronic anticoagulation   . Chronic back pain   . Chronic chest pain   . Chronic venous insufficiency   . Emphysema   . HTN (hypertension)   . Obesity   . PAF (paroxysmal atrial fibrillation) (HCC)   . Pneumothorax   . Rheumatoid arthritis(714.0)     Past Surgical History:  Procedure Laterality Date  . ablation    . CARDIAC CATHETERIZATION  1998   NORMAL  . HERNIA REPAIR    . PLEURAL SCARIFICATION    . VEIN LIGATION AND STRIPPING       reports that he quit smoking about 20 years ago. His smoking use included Cigarettes. He has a 60.00 pack-year smoking history. He has never used smokeless tobacco. He reports that he drinks alcohol. He reports that he does not use drugs.  No Known Allergies  Family History  Problem Relation Age of Onset  . Other Father     CEREBRAL THROMBOSIS  . Cancer Mother     bone cancer  . Rheum arthritis Mother   . Heart failure Brother 50  . Coronary artery disease Brother 70    PTCA  . Rheum arthritis Brother   . Rheum arthritis Brother        Prior to Admission medications   Medication Sig Start Date End Date Taking? Authorizing Provider  albuterol (PROAIR HFA) 108 (90 Base) MCG/ACT inhaler INHALE 2 PUFFS INTO THE LUNGS EVERY 4 (FOUR) HOURS AS NEEDED FOR WHEEZING OR SHORTNESS OF BREATH. 12/11/15   Leslye Peer, MD  amiodarone (PACERONE) 200 MG tablet Take 1 tablet by mouth  daily 12/18/15   Peter M [place], MD  ANORO ELLIPTA 62.5-25 MCG/INH AEPB INHALE 1 PUFF INTO THE LUNGS DAILY. 01/30/16   Leslye Peer, MD  atenolol (TENORMIN) 50 MG tablet Take 1 tablet (50 mg total) by mouth 2 (two) times daily. 10/17/15   Peter M [place], MD  diltiazem (CARDIZEM CD) 180 MG 24 hr capsule Take 1 capsule (180 mg total) by mouth daily. 10/17/15   Peter M [place], MD  furosemide (LASIX) 40 MG tablet Take 1 tablet (40 mg total) by mouth daily. 10/17/15   Peter M [place], MD  glucose blood (FREESTYLE LITE) test strip 2 strips by Does not apply route daily. 09/11/15   Historical Provider, MD  hydrochlorothiazide (HYDRODIURIL) 25 MG tablet Take 1 tablet (25 mg total) by mouth daily. 10/17/15   Peter M [place], MD  HYDROcodone-acetaminophen (NORCO/VICODIN)  5-325 MG per tablet Take 1 tablet by mouth every 6 (six) hours as needed for moderate pain.     Historical Provider, MD  metFORMIN (GLUCOPHAGE-XR) 500 MG 24 hr tablet Take 500 mg by mouth 2 (two) times daily.  09/11/15   Historical Provider, MD  omeprazole (PRILOSEC) 20 MG capsule Take 1 capsule (20 mg total) by mouth daily. 10/17/15   Peter M [place], MD  [facility] DELICA LANCETS 33G MISC Inject 2 Devices as directed daily. 09/11/15   Historical Provider, MD  potassium chloride SA (KLOR-CON M20) 20 MEQ tablet Take 1 tablet (20 mEq total) by mouth 2 (two) times daily. 10/26/15   Peter M [place], MD  rivaroxaban (XARELTO) 20 MG TABS tablet Take 1 tablet (20 mg total) by mouth daily with supper. 07/10/16   Peter M [place], MD  spironolactone (ALDACTONE) 25 MG tablet Take 1 tablet (25 mg total) by mouth daily. 10/17/15   Peter M  [place], MD  warfarin (COUMADIN) 5 MG tablet TAKE 1 TABLET BY MOUTH DAILY OR AS DIRECTED BY COUMADIN CLINIC 03/31/16   Peter M [place], MD    Physical Exam: Vitals:   08/20/16 2203 08/20/16 2204 08/21/16 0111 08/21/16 0116  BP: (!) 132/53  (!) 123/50   Pulse: 71  61   Resp: 16  18   Temp: 98.7 F (37.1 C)  98.7 F (37.1 C)   TempSrc: Oral  Oral   SpO2: 96%  98% 98%  Weight:  131.7 kg (290 lb 4.8 oz)    Height:  6\' 6"  (1.981 m)        Constitutional: NAD, calm, comfortable Eyes: PERRL, lids and conjunctivae normal ENMT: Mucous membranes are moist. Posterior pharynx clear of any exudate or lesions.Normal dentition.  Neck: normal, supple, no masses, no thyromegaly Respiratory: clear to auscultation bilaterally, no wheezing, no crackles. Normal respiratory effort. No accessory muscle use.  Cardiovascular: Regular rate and rhythm, no murmurs / rubs / gallops. No extremity edema. 2+ pedal pulses. No carotid bruits.  Abdomen: no tenderness, no masses palpated. No hepatosplenomegaly. Bowel sounds positive.  Musculoskeletal: no clubbing / cyanosis. No joint deformity upper and lower extremities. Good ROM, no contractures. Normal muscle tone.  Skin: no rashes, lesions, ulcers. No induration Neurologic: CN 2-12 grossly intact. Sensation intact, DTR normal. Strength 5/5 in all 4.  Psychiatric: Normal judgment and insight. Alert and oriented x 3. Normal mood.    Labs on Admission: I have personally reviewed following labs and imaging studies  CBC:  Recent Labs Lab 08/20/16 2230  WBC 19.4*  HGB 8.8*  HCT 30.2*  MCV 82.3  PLT 126*   Basic Metabolic Panel:  Recent Labs Lab 08/20/16 2230  NA 137  K 3.7  CL 102  CO2 26  GLUCOSE 160*  BUN 22*  CREATININE 1.15  CALCIUM 9.1   GFR: Estimated Creatinine Clearance: 90.9 mL/min (by C-G formula based on SCr of 1.15 mg/dL). Liver Function Tests:  Recent Labs Lab 08/20/16 2230  AST 18  ALT 14*  ALKPHOS 58  BILITOT 0.7  PROT  7.8  ALBUMIN 3.8   No results for input(s): LIPASE, AMYLASE in the last 168 hours. No results for input(s): AMMONIA in the last 168 hours. Coagulation Profile:  Recent Labs Lab 08/20/16 2230  INR 1.80   Cardiac Enzymes: No results for input(s): CKTOTAL, CKMB, CKMBINDEX, TROPONINI in the last 168 hours. BNP (last 3 results) No results for input(s): PROBNP in the last 8760 hours. HbA1C: No results for input(s): HGBA1C  in the last 72 hours. CBG: No results for input(s): GLUCAP in the last 168 hours. Lipid Profile: No results for input(s): CHOL, HDL, LDLCALC, TRIG, CHOLHDL, LDLDIRECT in the last 72 hours. Thyroid Function Tests: No results for input(s): TSH, T4TOTAL, FREET4, T3FREE, THYROIDAB in the last 72 hours. Anemia Panel: No results for input(s): VITAMINB12, FOLATE, FERRITIN, TIBC, IRON, RETICCTPCT in the last 72 hours. Urine analysis: No results found for: COLORURINE, APPEARANCEUR, LABSPEC, PHURINE, GLUCOSEU, HGBUR, BILIRUBINUR, KETONESUR, PROTEINUR, UROBILINOGEN, NITRITE, LEUKOCYTESUR Sepsis Labs: @LABRCNTIP (procalcitonin:4,lacticidven:4) )No results found for this or any previous visit (from the past 240 hour(s)).   Radiological Exams on Admission: No results found.  EKG: Independently reviewed.  Assessment/Plan Principal Problem:   Acute blood loss anemia Active Problems:   Chronic anticoagulation   BRBPR (bright red blood per rectum)   HTN (hypertension)   DM2 (diabetes mellitus, type 2) (HCC)    1. Acute blood loss anemia due to BRBPR - 1. BRBPR appears resolved at this point with last 2 BMs being normal 2. Will stop the patients Xarelto 3. GI to see in AM 4. I told patient that he probably would be off of all anticoagulants "for at least a couple of weeks" and "until GI said he could restart them" 1. Patient and wife also request that Dr. be informed of this as they expressed apprehension over his stroke risk from A.Fib while off of  anticoagulants 2. Did try to reassure them that this risk was small, and that having a significant GI bleed was an absolute contraindication to continuing anticoagulants in the immediate future. 5. Repeat CBC in AM 2. HTN - continue home meds 3. DM2 - continue home meds   DVT prophylaxis: SCDs Code Status: Full Family Communication: Wife at bedside Consults called: GI called by EDP and will see in AM Admission status: Admit to inpatient   [place] DO [facility] Pager [phone] from 7PM-7AM  If 7AM-7PM, please contact the day physician for the patient [email or web address] Password [email or web address]  08/21/2016, 2:10 AM

## 2016-08-21 NOTE — Progress Notes (Addendum)
PROGRESS NOTE    [name]   MBE:[id number]  DOB: 1945/09/23  DOA: 08/21/2016 PCP: Fraser Din, PA-C   Brief Narrative:  [name] is a 71 y.o. male with medical history significant of A.Fib, patient was recently changed from coumadin to Xarelto.  4 days ago he developed BRBPR.  This went on for 4 days before resolving 24 hours ago, last 2 BMs have been normal.  He went to PCPs office yesterday afternoon for this and had labs drawn.  WBC was 20.7k and HGB 9.1.  He was called and informed of the results about 4 hours ago and told to come in to the ED as his baseline HGB was over 15 a year ago.  Subjective: No bleeding since being admitted to the hospital.   Assessment & Plan:   Principal Problem:   Acute blood loss anemia/  BRBPR /  Chronic anticoagulation - Hb stable at 8 since admission- will obtain anemia panel tomorrow - has stopped bleeding for now - Xarelto on hold - GI has been called- clear liquids for now- plan to to scope on Monday under anesthesia (due to OSA/ COPD and morbid obesity)  Active Problems: Chest tightness with exertion - call cardiology for stress myoview tomorrow  Leukocytosis, anemia and thrombocytopenia - smear shows increased neutrophils, target cells, large platelets- will d/w hematology  A-fib CHA2DS2-VASc Score 3 - cont Amiodarone, Diltiazem, Atenolol - holding Xarelto for now- hold off on starting Heparin for bridging today as he still has a small amount of blood in stool     HTN (hypertension) - Atenolol  Pedal edema -  On Lasix, HCTZ, Aldactone at home- hold HCTZ and Aldactone for now- cont Lasix    DM2 (diabetes mellitus, type 2)   - Hold metformin- cont SSI   Code Status: Full code DVT prophylaxis: SCDs Family Communication:  Disposition Plan: GI to follow Consultants:   GI Procedures:    Antimicrobials:  Anti-infectives    None       Objective: Vitals:   08/21/16 0310 08/21/16 0313 08/21/16 0344 08/21/16 0954   BP: 132/57 132/57 (!) 132/52   Pulse: 66 66 67   Resp:  18 19   Temp:  98.7 F (37.1 C) 98.2 F (36.8 C)   TempSrc:  Oral Oral   SpO2:  98% 100% 98%  Weight:   (!) 158.1 kg (348 lb 8 oz)   Height:   6\' 6"  (1.981 m)    No intake or output data in the 24 hours ending 08/21/16 1410 Filed Weights   08/20/16 2204 08/21/16 0344  Weight: 131.7 kg (290 lb 4.8 oz) (!) 158.1 kg (348 lb 8 oz)    Examination: General exam: Appears comfortable  HEENT: PERRLA, oral mucosa moist, no sclera icterus or thrush Respiratory system: Clear to auscultation. Respiratory effort normal. Cardiovascular system: S1 & S2 heard, RRR.  No murmurs  Gastrointestinal system: Abdomen soft, non-tender, nondistended. Normal bowel sound. No organomegaly Central nervous system: Alert and oriented. No focal neurological deficits. Extremities: No cyanosis, clubbing or edema Skin: No rashes or ulcers Psychiatry:  Mood & affect appropriate.     Data Reviewed: I have personally reviewed following labs and imaging studies  CBC:  Recent Labs Lab 08/20/16 2230 08/21/16 0546 08/21/16 0928  WBC 19.4* 18.1* 18.2*  NEUTROABS  --   --  12.7*  HGB 8.8* 8.2* 8.5*  HCT 30.2* 28.4* 29.0*  MCV 82.3 82.3 82.6  PLT 126* 101* 129*  Basic Metabolic Panel:  Recent Labs Lab 08/20/16 2230  NA 137  K 3.7  CL 102  CO2 26  GLUCOSE 160*  BUN 22*  CREATININE 1.15  CALCIUM 9.1   GFR: Estimated Creatinine Clearance: 99.8 mL/min (by C-G formula based on SCr of 1.15 mg/dL). Liver Function Tests:  Recent Labs Lab 08/20/16 2230  AST 18  ALT 14*  ALKPHOS 58  BILITOT 0.7  PROT 7.8  ALBUMIN 3.8   No results for input(s): LIPASE, AMYLASE in the last 168 hours. No results for input(s): AMMONIA in the last 168 hours. Coagulation Profile:  Recent Labs Lab 08/20/16 2230  INR 1.80   Cardiac Enzymes: No results for input(s): CKTOTAL, CKMB, CKMBINDEX, TROPONINI in the last 168 hours. BNP (last 3 results) No  results for input(s): PROBNP in the last 8760 hours. HbA1C: No results for input(s): HGBA1C in the last 72 hours. CBG: No results for input(s): GLUCAP in the last 168 hours. Lipid Profile: No results for input(s): CHOL, HDL, LDLCALC, TRIG, CHOLHDL, LDLDIRECT in the last 72 hours. Thyroid Function Tests: No results for input(s): TSH, T4TOTAL, FREET4, T3FREE, THYROIDAB in the last 72 hours. Anemia Panel: No results for input(s): VITAMINB12, FOLATE, FERRITIN, TIBC, IRON, RETICCTPCT in the last 72 hours. Urine analysis: No results found for: COLORURINE, APPEARANCEUR, LABSPEC, PHURINE, GLUCOSEU, HGBUR, BILIRUBINUR, KETONESUR, PROTEINUR, UROBILINOGEN, NITRITE, LEUKOCYTESUR Sepsis Labs: @LABRCNTIP (procalcitonin:4,lacticidven:4) )No results found for this or any previous visit (from the past 240 hour(s)).       Radiology Studies: No results found.    Scheduled Meds: . amiodarone  200 mg Oral Daily  . atenolol  50 mg Oral BID  . diltiazem  180 mg Oral Daily  . furosemide  40 mg Oral Daily  . pantoprazole  40 mg Oral Daily  . potassium chloride SA  20 mEq Oral BID  . sodium chloride flush  3 mL Intravenous Q12H  . umeclidinium-vilanterol  1 puff Inhalation Daily   Continuous Infusions:   LOS: 0 days    Time spent in minutes: 35 min    Ricky Lewman, MD [facility] Pager: [email or web address] Password [email or web address] 08/21/2016, 2:10 PM

## 2016-08-21 NOTE — Consult Note (Signed)
Referring Provider:   S. Butler Denmark, MD ([facility]) Primary Care Physician:  Fraser Din, PA-C Primary Gastroenterologist:  Dr. Reece Agar  Reason for Consultation:  Hematochezia with anemia  HPI: [name] is a 71 y.o. male admitted through the emergency room today with a several day history of painless maroon stools which have tapered off, with his most recent bowel movements free of blood until this afternoon, when he had one that was brown but with a halo of red blood in the toilet bowl. The patient is on Xarelto because of atrial fibrillation (followed by Dr. Peter Swaziland), and has significant morbidities including morbid obesity, obstructive sleep apnea, emphysema (followed by Dr. Jasmine Awe).  He had a normal colonoscopy by Dr. Reece Agar in January 2008.  The patient has not had abdominal pain, but reports exertional chest pain, relieved by rest, over the past couple of weeks, during which time his breathing has also been more difficult. At baseline, he has limited exercise tolerance, walking perhaps 100 feet before having to rest.  In the emergency room, the patient's hemoglobin was 8.2 with an MCV of 82, as compared to 15.3 in March 2017.   Past Medical History:  Diagnosis Date  . Chronic anticoagulation   . Chronic back pain   . Chronic chest pain   . Chronic venous insufficiency   . Emphysema   . HTN (hypertension)   . Obesity   . PAF (paroxysmal atrial fibrillation) (HCC)   . Pneumothorax   . Rheumatoid arthritis(714.0)     Past Surgical History:  Procedure Laterality Date  . ablation    . CARDIAC CATHETERIZATION  1998   NORMAL  . HERNIA REPAIR    . PLEURAL SCARIFICATION    . VEIN LIGATION AND STRIPPING      Prior to Admission medications   Medication Sig Start Date End Date Taking? Authorizing Provider  albuterol (PROVENTIL HFA;VENTOLIN HFA) 108 (90 Base) MCG/ACT inhaler Inhale 2 puffs into the lungs every 4 (four) hours as needed for wheezing  or shortness of breath.   Yes Historical Provider, MD  amiodarone (PACERONE) 200 MG tablet Take 200 mg by mouth every evening.   Yes Historical Provider, MD  atenolol (TENORMIN) 50 MG tablet Take 1 tablet (50 mg total) by mouth 2 (two) times daily. 10/17/15  Yes Peter M Swaziland, MD  diltiazem (CARDIZEM CD) 180 MG 24 hr capsule Take 180 mg by mouth every evening.   Yes Historical Provider, MD  furosemide (LASIX) 40 MG tablet Take 1 tablet (40 mg total) by mouth daily. 10/17/15  Yes Peter M Sw[place], MD  hydrochlorothiazide (HYDRODIURIL) 25 MG tablet Take 1 tablet (25 mg total) by mouth daily. 10/17/15  Yes Peter M [place], MD  HYDROcodone-acetaminophen (NORCO/VICODIN) 5-325 MG per tablet Take 1 tablet by mouth every 6 (six) hours as needed for moderate pain.    Yes Historical Provider, MD  metFORMIN (GLUCOPHAGE-XR) 500 MG 24 hr tablet Take 500 mg by mouth 2 (two) times daily.    Yes Historical Provider, MD  omeprazole (PRILOSEC) 20 MG capsule Take 1 capsule (20 mg total) by mouth daily. 10/17/15  Yes Peter M Swazil[place], MD  potassium chloride SA (KLOR-CON M20) 20 MEQ tablet Take 1 tablet (20 mEq total) by mouth 2 (two) times daily. 10/26/15  Yes Peter M [place], MD  rivaroxaban (XARELTO) 20 MG TABS tablet Take 1 tablet (20 mg total) by mouth daily with supper. 07/10/16  Yes Peter M [place], MD  spironolactone (ALDACTONE)  25 MG tablet Take 1 tablet (25 mg total) by mouth daily. 10/17/15  Yes Peter M Swaziland, MD  umeclidinium-vilanterol [facility] ELLIPTA) 62.5-25 MCG/INH AEPB Inhale 1 puff into the lungs daily.   Yes Historical Provider, MD    Current Facility-Administered Medications  Medication Dose Route Frequency Provider Last Rate Last Dose  . albuterol (PROVENTIL) (2.5 MG/3ML) 0.083% nebulizer solution 3 mL  3 mL Inhalation Q4H PRN Hillary Bow, DO   3 mL at 08/21/16 0232  . amiodarone (PACERONE) tablet 200 mg  200 mg Oral Daily Hillary Bow, DO   200 mg at 08/21/16 1019  . atenolol (TENORMIN) tablet 50 mg  50 mg  Oral BID Hillary Bow, DO   50 mg at 08/21/16 1019  . diltiazem (CARDIZEM CD) 24 hr capsule 180 mg  180 mg Oral Daily Hillary Bow, DO   180 mg at 08/21/16 1019  . furosemide (LASIX) tablet 40 mg  40 mg Oral Daily Hillary Bow, DO   40 mg at 08/21/16 1020  . HYDROcodone-acetaminophen (NORCO/VICODIN) 5-325 MG per tablet 1 tablet  1 tablet Oral Q6H PRN Hillary Bow, DO      . pantoprazole (PROTONIX) EC tablet 40 mg  40 mg Oral Daily Hillary Bow, DO   40 mg at 08/21/16 1019  . potassium chloride SA (K-DUR,KLOR-CON) CR tablet 20 mEq  20 mEq Oral BID Hillary Bow, DO   20 mEq at 08/21/16 1019  . sodium chloride flush (NS) 0.9 % injection 3 mL  3 mL Intravenous Q12H Hillary Bow, DO   3 mL at 08/21/16 1000  . umeclidinium-vilanterol (ANORO ELLIPTA) 62.5-25 MCG/INH 1 puff  1 puff Inhalation Daily Hillary Bow, DO   1 puff at 08/21/16 9371    Allergies as of 08/20/2016  . (No Known Allergies)    Family History  Problem Relation Age of Onset  . Other Father     CEREBRAL THROMBOSIS  . Cancer Mother     bone cancer  . Rheum arthritis Mother   . Heart failure Brother 50  . Coronary artery disease Brother 19    PTCA  . Rheum arthritis Brother   . Rheum arthritis Brother     Social History   Social History  . Marital status: Married    Spouse name: N/A  . Number of children: 3  . Years of education: N/A   Occupational History  . forklift operator [name]   Social History Main Topics  . Smoking status: Former Smoker    Packs/day: 2.00    Years: 30.00    Types: Cigarettes    Quit date: 03/14/1996  . Smokeless tobacco: Never Used  . Alcohol use Yes     Comment: one beer per month  . Drug use: No  . Sexual activity: No   Other Topics Concern  . Not on file   Social History Narrative  . No narrative on file    Review of Systems: Negative for GI symptoms, significant skin problems, enlarged chronic lymph nodes, throat soreness (although does have a dry  throat making it sometimes difficult to swallow), urinary symptoms, or psychiatric problems. Cardiopulmonary symptoms as per history of present illness.  Physical Exam: Vital signs in last 24 hours: Temp:  [98.2 F (36.8 C)-98.7 F (37.1 C)] 98.2 F (36.8 C) (02/08 0344) Pulse Rate:  [61-71] 67 (02/08 0344) Resp:  [16-19] 19 (02/08 0344) BP: (123-132)/(50-57) 132/52 (02/08 0344) SpO2:  [96 %-100 %]  98 % (02/08 0954) Weight:  [131.7 kg (290 lb 4.8 oz)-158.1 kg (348 lb 8 oz)] 158.1 kg (348 lb 8 oz) (02/08 0344) Last BM Date: 08/21/16 General:  Pleasant, very overweight, articulate gentleman sitting in the bedside chair in no distress. Head:  Normocephalic and atraumatic. Eyes:  Sclera clear, no icterus.   Conjunctiva pink. Mouth:   No ulcerations or lesions.  Oropharynx pink & moist. Neck:   No masses or thyromegaly. Lungs:  Clear throughout to auscultation.   Remarkably good vesicular breath sounds for patient with history of emphysema. No wheezes, crackles, or rhonchi. No evident respiratory distress. Heart:   Regular rate and rhythm at present; no murmurs, clicks, rubs,  or gallops. Abdomen: Severely adipose, no tenderness by recollection Msk:   Symmetrical without gross deformities. Extremities:  Severe chronic lower extremity edema, currently wearing knee-high support hose Neurologic:  Alert and coherent;  grossly normal neurologically. Skin:  Intact without significant lesions or rashes. Cervical Nodes:  No significant cervical adenopathy. Psych:   Alert and cooperative. Normal mood and affect.  Intake/Output from previous day: No intake/output data recorded. Intake/Output this shift: No intake/output data recorded.  Lab Results:  Recent Labs  08/20/16 2230 08/21/16 0546 08/21/16 0928  WBC 19.4* 18.1* 18.2*  HGB 8.8* 8.2* 8.5*  HCT 30.2* 28.4* 29.0*  PLT 126* 101* 129*   BMET  Recent Labs  08/20/16 2230  NA 137  K 3.7  CL 102  CO2 26  GLUCOSE 160*  BUN 22*   CREATININE 1.15  CALCIUM 9.1   LFT  Recent Labs  08/20/16 2230  PROT 7.8  ALBUMIN 3.8  AST 18  ALT 14*  ALKPHOS 58  BILITOT 0.7   PT/INR  Recent Labs  08/20/16 2230  LABPROT 21.1*  INR 1.80    Studies/Results: No results found.  Impression: 1. Acute, or acute on chronic anemia with borderline microcytosis--iron studies pending 2. Recurrent hematochezia, now having tapered off, with negative colonoscopy 10 years ago 3. Significant medical problems with morbid obesity, obstructive sleep apnea, emphysema, atrial fibrillation, chronic anticoagulation 4. Recent development of exacerbated shortness of breath and exertional chest pain,? Related to anemia versus development of coronary disease (all 3 brothers have died of heart disease)  Plan: I feel that this patient should have colonoscopy during this admission, and possibly concurrent endoscopy as well. However, this should be done under monitored anesthesia care in view of his medical conditions (not clear that that will be available tomorrow). Moreover, I feel he should have cardiology clearance prior to colonoscopy in view of the recent exacerbation of his chest pain. This has been ordered by his attending physician. We will see the patient again in a couple of days and consider arranging endoscopy and colonoscopy for next week if he has cardiac clearance. Please call us in the meantime if the patient he stabilizes or if we can be of further assistance in his care.   LOS: 0 days   Natosha Bou V  08/21/2016, 5:13 PM   Pager [phone] If no answer or after 5 PM call [phone]

## 2016-08-21 NOTE — ED Provider Notes (Signed)
[facility]-EMERGENCY DEPT Provider Note   CSN: [id number] Arrival date & time: 08/20/16  2137  By signing my name below, I, [name], attest that this documentation has been prepared under the direction and in the presence of Ricky Lyons, MD. Electronically Signed: Rosario [name], ED Scribe. 08/21/16. 12:57 AM.  History   Chief Complaint Chief Complaint  Patient presents with  . Abnormal Lab   The history is provided by the patient. No language interpreter was used.  Abnormal Lab  Time since result:  4 Patient referred by:  PCP Resulting agency:  External Result type: hematology   Hematology:    Hemoglobin:  Low   Leukocytes:  High   HPI Comments: [name] is a 71 y.o. male with a h/o GERD, COPD, and obesity, who presents to the Emergency Department following an abnormal lab result which was taken at his PCP's office yesterday. Pt was seen at his PCP's office yesterday afternoon for intermittent episodes of rectal bleeding and maroon colored stools over the past four days. However, he notes that he has had two bowel movements today without the passage of any blood. At his visit they took a CBC and he was called ~4 hours ago and informed that his WBC was 20.7 and his HGB was 9.1. His PCP additionally noted to him that he did have a small external hemorrhoid on physical examination of the rectum.  Pt notes that he otherwise feels at his baseline while in the ED. Pt has had three prior endoscopies that were all unremarkable, per pt. His last was ~8 years ago. He is currently anticoagulated on Xarelto therapy daily which he was switch onto from Coumadin one week ago and prior to the onset of his rectal bleeding. Pt denies fever, nausea, vomiting, light-headedness, dizziness, shortness of breath, abdominal pain, back pain, rectal pain, or any other associated symptoms.   Past Medical History:  Diagnosis Date  . Chronic anticoagulation   . Chronic back pain   . Chronic chest  pain   . Chronic venous insufficiency   . Emphysema   . HTN (hypertension)   . Obesity   . PAF (paroxysmal atrial fibrillation) (HCC)   . Pneumothorax   . Rheumatoid arthritis(714.0)    Patient Active Problem List   Diagnosis Date Noted  . Epistaxis 06/12/2016  . Neck pain on left side 06/12/2016  . Chronic venous insufficiency 10/01/2015  . Respiratory distress 09/19/2013  . GERD (gastroesophageal reflux disease) 09/05/2013  . Pain in limb 11/30/2012  . Varicose veins of lower extremities with other complications 11/30/2012  . COPD (chronic obstructive pulmonary disease) (HCC) 12/12/2010  . Sleep apnea 12/12/2010  . Rheumatoid arthritis(714.0) 12/12/2010  . Chest pain 10/04/2010  . High risk medications (not anticoagulants) long-term use 10/04/2010  . Dyspnea 10/04/2010  . Chronic anticoagulation   . Chronic chest pain   . Obesity    Past Surgical History:  Procedure Laterality Date  . ablation    . CARDIAC CATHETERIZATION  1998   NORMAL  . HERNIA REPAIR    . PLEURAL SCARIFICATION    . VEIN LIGATION AND STRIPPING      Home Medications    Prior to Admission medications   Medication Sig Start Date End Date Taking? Authorizing Provider  albuterol (PROAIR HFA) 108 (90 Base) MCG/ACT inhaler INHALE 2 PUFFS INTO THE LUNGS EVERY 4 (FOUR) HOURS AS NEEDED FOR WHEEZING OR SHORTNESS OF BREATH. 12/11/15   Leslye Peer, MD  amiodarone (PACERONE) 200  MG tablet Take 1 tablet by mouth  daily 12/18/15   Peter M Swaziland, MD  ANORO ELLIPTA 62.5-25 MCG/INH AEPB INHALE 1 PUFF INTO THE LUNGS DAILY. 01/30/16   Leslye Peer, MD  atenolol (TENORMIN) 50 MG tablet Take 1 tablet (50 mg total) by mouth 2 (two) times daily. 10/17/15   Peter M [place], MD  diltiazem (CARDIZEM CD) 180 MG 24 hr capsule Take 1 capsule (180 mg total) by mouth daily. 10/17/15   Peter M [place], MD  furosemide (LASIX) 40 MG tablet Take 1 tablet (40 mg total) by mouth daily. 10/17/15   Peter M Swaziland, MD  glucose blood (FREESTYLE  LITE) test strip 2 strips by Does not apply route daily. 09/11/15   Historical Provider, MD  hydrochlorothiazide (HYDRODIURIL) 25 MG tablet Take 1 tablet (25 mg total) by mouth daily. 10/17/15   Peter M [place], MD  HYDROcodone-acetaminophen (NORCO/VICODIN) 5-325 MG per tablet Take 1 tablet by mouth every 6 (six) hours as needed for moderate pain.     Historical Provider, MD  metFORMIN (GLUCOPHAGE-XR) 500 MG 24 hr tablet Take 500 mg by mouth 2 (two) times daily.  09/11/15   Historical Provider, MD  omeprazole (PRILOSEC) 20 MG capsule Take 1 capsule (20 mg total) by mouth daily. 10/17/15   Peter M [place], MD  [facility] DELICA LANCETS 33G MISC Inject 2 Devices as directed daily. 09/11/15   Historical Provider, MD  potassium chloride SA (KLOR-CON M20) 20 MEQ tablet Take 1 tablet (20 mEq total) by mouth 2 (two) times daily. 10/26/15   Peter M Sw[place], MD  rivaroxaban (XARELTO) 20 MG TABS tablet Take 1 tablet (20 mg total) by mouth daily with supper. 07/10/16   Peter M [place], MD  spironolactone (ALDACTONE) 25 MG tablet Take 1 tablet (25 mg total) by mouth daily. 10/17/15   Peter M [place], MD  warfarin (COUMADIN) 5 MG tablet TAKE 1 TABLET BY MOUTH DAILY OR AS DIRECTED BY COUMADIN CLINIC 03/31/16   Peter M Sw[place]and, MD   Family History Family History  Problem Relation Age of Onset  . Other Father     CEREBRAL THROMBOSIS  . Cancer Mother     bone cancer  . Rheum arthritis Mother   . Heart failure Brother 50  . Coronary artery disease Brother 70    PTCA  . Rheum arthritis Brother   . Rheum arthritis Brother    Social History Social History  Substance Use Topics  . Smoking status: Former Smoker    Packs/day: 2.00    Years: 30.00    Types: Cigarettes    Quit date: 03/14/1996  . Smokeless tobacco: Never Used  . Alcohol use Yes     Comment: one beer per month   Allergies   Patient has no known allergies.  Review of Systems Review of Systems  Constitutional: Negative for fever.  Respiratory: Negative for  shortness of breath.   Gastrointestinal: Positive for blood in stool. Negative for abdominal pain, nausea, rectal pain and vomiting.  Neurological: Negative for dizziness and light-headedness.  All other systems reviewed and are negative.  Physical Exam Updated Vital Signs BP (!) 132/53 (BP Location: Left Arm)   Pulse 71   Temp 98.7 F (37.1 C) (Oral)   Resp 16   Ht 6\' 6"  (1.981 m)   Wt 290 lb 4.8 oz (131.7 kg)   SpO2 96%   BMI 33.55 kg/m   Physical Exam  Constitutional: He is oriented to person, place, and time. He appears  well-developed and well-nourished.  HENT:  Head: Normocephalic and atraumatic.  Eyes: EOM are normal.  Neck: Normal range of motion.  Cardiovascular: Normal rate, regular rhythm, normal heart sounds and intact distal pulses.   Pulmonary/Chest: Effort normal and breath sounds normal. No respiratory distress.  Abdominal: Soft. He exhibits no distension. There is no tenderness.  Musculoskeletal: Normal range of motion.  Neurological: He is alert and oriented to person, place, and time.  Skin: Skin is warm and dry.  Psychiatric: He has a normal mood and affect. Judgment normal.  Nursing note and vitals reviewed.  ED Treatments / Results  DIAGNOSTIC STUDIES: Oxygen Saturation is 96% on RA, normal by my interpretation.   COORDINATION OF CARE: 12:55 AM-Discussed next steps with pt. Pt verbalized understanding and is agreeable with the plan.   Labs (all labs ordered are listed, but only abnormal results are displayed) Labs Reviewed  COMPREHENSIVE METABOLIC PANEL - Abnormal; Notable for the following:       Result Value   Glucose, Bld 160 (*)    BUN 22 (*)    ALT 14 (*)    All other components within normal limits  CBC - Abnormal; Notable for the following:    WBC 19.4 (*)    RBC 3.67 (*)    Hemoglobin 8.8 (*)    HCT 30.2 (*)    MCH 24.0 (*)    MCHC 29.1 (*)    RDW 17.8 (*)    Platelets 126 (*)    All other components within normal limits    PROTIME-INR - Abnormal; Notable for the following:    Prothrombin Time 21.1 (*)    All other components within normal limits  POC OCCULT BLOOD, ED  TYPE AND SCREEN  ABO/RH   EKG  EKG Interpretation None      Radiology No results found.  Procedures Procedures   Medications Ordered in ED Medications - No data to display  Initial Impression / Assessment and Plan / ED Course  I have reviewed the triage vital signs and the nursing notes.  Pertinent labs & imaging results that were available during my care of the patient were reviewed by me and considered in my medical decision making (see chart for details).  Patient with history of paroxysmal A. fib. His medications were recently changed from Coumadin to Xarelto. He presents here today with a four-day history of maroon-colored stools. He was seen in the doctor's office today and had laboratory studies obtained. He received a phone call from the office informing him he was anemic and needed to go to the ER to be evaluated.  His hemoglobin is 8.8 which is a significant drop from his baseline. He is, however asymptomatic and not experiencing any lightheadedness, chest pain, or shortness of breath.  I've discussed this with Dr. Matthias Hughs from [facility] gastroenterology who is recommending admission to the hospitalist service. GI will consult in the morning.  Final Clinical Impressions(s) / ED Diagnoses   Final diagnoses:  None   New Prescriptions New Prescriptions   No medications on file   I personally performed the services described in this documentation, which was scribed in my presence. The recorded information has been reviewed and is accurate.      Ricky Lyons, MD 08/21/16 [phone]

## 2016-08-22 ENCOUNTER — Ambulatory Visit (HOSPITAL_COMMUNITY)
Admit: 2016-08-22 | Discharge: 2016-08-22 | Disposition: A | Discharge status: Home / Self Care | Payer: Medicare Other | Attending: Cardiology | Admitting: Cardiology

## 2016-08-22 ENCOUNTER — Inpatient Hospital Stay (HOSPITAL_COMMUNITY): Payer: Medicare Other

## 2016-08-22 DIAGNOSIS — R079 Chest pain, unspecified: Secondary | ICD-10-CM

## 2016-08-22 DIAGNOSIS — R0602 Shortness of breath: Secondary | ICD-10-CM

## 2016-08-22 DIAGNOSIS — I208 Other forms of angina pectoris: Secondary | ICD-10-CM

## 2016-08-22 DIAGNOSIS — K922 Gastrointestinal hemorrhage, unspecified: Secondary | ICD-10-CM

## 2016-08-22 LAB — CBC WITH DIFFERENTIAL/PLATELET
BASOS ABS: 0.3 10*3/uL — AB (ref 0.0–0.1)
Basophils Relative: 2 %
EOS ABS: 0.5 10*3/uL (ref 0.0–0.7)
Eosinophils Relative: 3 %
HCT: 29 % — ABNORMAL LOW (ref 39.0–52.0)
HEMOGLOBIN: 8.6 g/dL — AB (ref 13.0–17.0)
LYMPHS ABS: 3.3 10*3/uL (ref 0.7–4.0)
LYMPHS PCT: 22 %
MCH: 24.3 pg — AB (ref 26.0–34.0)
MCHC: 29.7 g/dL — AB (ref 30.0–36.0)
MCV: 81.9 fL (ref 78.0–100.0)
MONOS PCT: 8 %
Monocytes Absolute: 1.2 10*3/uL — ABNORMAL HIGH (ref 0.1–1.0)
Neutro Abs: 9.7 10*3/uL — ABNORMAL HIGH (ref 1.7–7.7)
Neutrophils Relative %: 65 %
PLATELETS: 102 10*3/uL — AB (ref 150–400)
RBC: 3.54 MIL/uL — AB (ref 4.22–5.81)
RDW: 17.9 % — AB (ref 11.5–15.5)
WBC: 15 10*3/uL — AB (ref 4.0–10.5)

## 2016-08-22 LAB — BASIC METABOLIC PANEL
Anion gap: 8 (ref 5–15)
BUN: 18 mg/dL (ref 6–20)
CO2: 25 mmol/L (ref 22–32)
Calcium: 9 mg/dL (ref 8.9–10.3)
Chloride: 105 mmol/L (ref 101–111)
Creatinine, Ser: 1.01 mg/dL (ref 0.61–1.24)
GFR calc Af Amer: >60 mL/min (ref >60)
GFR calc non Af Amer: >60 mL/min (ref >60)
Glucose, Bld: 153 mg/dL — ABNORMAL HIGH (ref 65–99)
Potassium: 3.9 mmol/L (ref 3.5–5.1)
Sodium: 138 mmol/L (ref 135–145)

## 2016-08-22 LAB — TROPONIN I: Troponin I: <0.03 ng/mL (ref <0.03)

## 2016-08-22 LAB — C-REACTIVE PROTEIN: CRP: 1.2 mg/dL — ABNORMAL HIGH (ref <1.0)

## 2016-08-22 LAB — VITAMIN B12: Vitamin B-12: 771 pg/mL (ref 180–914)

## 2016-08-22 MED ORDER — SODIUM CHLORIDE 0.9 % IV SOLN
510.0000 mg | Freq: Once | INTRAVENOUS | Status: AC
  Administered 2016-08-22: 510 mg via INTRAVENOUS
  Filled 2016-08-22: qty 17

## 2016-08-22 MED ORDER — TECHNETIUM TC 99M TETROFOSMIN IV KIT
30.0000 | PACK | Freq: Once | INTRAVENOUS | Status: AC | PRN
  Administered 2016-08-22: 30 via INTRAVENOUS

## 2016-08-22 NOTE — Progress Notes (Signed)
PROGRESS NOTE    [name]   UVJ:[id number]  DOB: [birth year]  DOA: 08/21/2016 PCP: Fraser Din, PA-C   Brief Narrative:  [name] is a 71 y.o. male with medical history significant of A.Fib, patient was recently changed from coumadin to Xarelto.  4 days ago he developed BRBPR.  This went on for 4 days before resolving 24 hours ago, last 2 BMs have been normal.  He went to PCPs office yesterday afternoon for this and had labs drawn.  WBC was 20.7k and HGB 9.1.  He was called and informed of the results about 4 hours ago and told to come in to the ED as his baseline HGB was over 15 a year ago.  Subjective: No bleeding since yesterday.   Assessment & Plan:   Principal Problem:   Acute blood loss anemia/  BRBPR /  Chronic anticoagulation - Hb stable at 8 since admission  - has stopped bleeding for now - Xarelto on hold - GI has been called- clear liquids for now- plan to to scope on Monday under anesthesia (due to OSA/ COPD and morbid obesity) - Anemia panel shows low Ferretin level and elevated TIBC consistent with Iron deficiency- will order IV Iron today  Active Problems: Chest tightness with exertion - called cardiology-  stress myoview 2 day study ordered by Dr Mayford Knife  Leukocytosis, anemia and thrombocytopenia - smear shows increased neutrophils, target cells, large platelets-  d/w hematology- per Dr Pamelia Hoit, appears to be chronic inflammation- requested CRP which is only 1.2- he also recommended flow cytometry which I have ordered- he will f/u with him as outpt. - with iron studies showing Iron deficiency today  A-fib CHA2DS2-VASc Score 3 - cont Amiodarone, Diltiazem, Atenolol - holding Xarelto for now- hold off on starting Heparin for bridging     HTN (hypertension) - Atenolol  Pedal edema -  On Lasix, HCTZ, Aldactone at home- hold HCTZ and Aldactone for now- cont Lasix    DM2 (diabetes mellitus, type 2)   - Hold metformin- cont SSI   Code Status: Full  code DVT prophylaxis: SCDs Family Communication:  Disposition Plan: GI to follow Consultants:   GI Procedures:    Antimicrobials:  Anti-infectives    None       Objective: Vitals:   08/21/16 0954 08/21/16 2213 08/22/16 0540 08/22/16 0900  BP:  (!) 123/51 (!) 139/41   Pulse:  (!) 56 61   Resp:  18 18   Temp:  98 F (36.7 C) 99.1 F (37.3 C)   TempSrc:  Oral Oral   SpO2: 98% 99% 99% 99%  Weight:   (!) 156.5 kg (345 lb)   Height:        Intake/Output Summary (Last 24 hours) at 08/22/16 0946 Last data filed at 08/22/16 0600  Gross per 24 hour  Intake              480 ml  Output                0 ml  Net              480 ml   Filed Weights   08/20/16 2204 08/21/16 0344 08/22/16 0540  Weight: 131.7 kg (290 lb 4.8 oz) (!) 158.1 kg (348 lb 8 oz) (!) 156.5 kg (345 lb)    Examination: General exam: Appears comfortable  HEENT: PERRLA, oral mucosa moist, no sclera icterus or thrush Respiratory system: Clear to auscultation. Respiratory effort normal. Cardiovascular  system: S1 & S2 heard, RRR.  No murmurs  Gastrointestinal system: Abdomen soft, non-tender, nondistended. Normal bowel sound. No organomegaly Central nervous system: Alert and oriented. No focal neurological deficits. Extremities: No cyanosis, clubbing or edema Skin: No rashes or ulcers Psychiatry:  Mood & affect appropriate.     Data Reviewed: I have personally reviewed following labs and imaging studies  CBC:  Recent Labs Lab 08/20/16 2230 08/21/16 0546 08/21/16 0928 08/22/16 0541  WBC 19.4* 18.1* 18.2* 15.0*  NEUTROABS  --   --  12.7* PENDING  HGB 8.8* 8.2* 8.5* 8.6*  HCT 30.2* 28.4* 29.0* 29.0*  MCV 82.3 82.3 82.6 81.9  PLT 126* 101* 129* 102*   Basic Metabolic Panel:  Recent Labs Lab 08/20/16 2230 08/22/16 0541  NA 137 138  K 3.7 3.9  CL 102 105  CO2 26 25  GLUCOSE 160* 153*  BUN 22* 18  CREATININE 1.15 1.01  CALCIUM 9.1 9.0   GFR: Estimated Creatinine Clearance: 113 mL/min  (by C-G formula based on SCr of 1.01 mg/dL). Liver Function Tests:  Recent Labs Lab 08/20/16 2230  AST 18  ALT 14*  ALKPHOS 58  BILITOT 0.7  PROT 7.8  ALBUMIN 3.8   No results for input(s): LIPASE, AMYLASE in the last 168 hours. No results for input(s): AMMONIA in the last 168 hours. Coagulation Profile:  Recent Labs Lab 08/20/16 2230  INR 1.80   Cardiac Enzymes: No results for input(s): CKTOTAL, CKMB, CKMBINDEX, TROPONINI in the last 168 hours. BNP (last 3 results) No results for input(s): PROBNP in the last 8760 hours. HbA1C: No results for input(s): HGBA1C in the last 72 hours. CBG: No results for input(s): GLUCAP in the last 168 hours. Lipid Profile: No results for input(s): CHOL, HDL, LDLCALC, TRIG, CHOLHDL, LDLDIRECT in the last 72 hours. Thyroid Function Tests: No results for input(s): TSH, T4TOTAL, FREET4, T3FREE, THYROIDAB in the last 72 hours. Anemia Panel:  Recent Labs  08/21/16 1801 08/22/16 0541  VITAMINB12  --  771  FOLATE 14.2  --   FERRITIN 17*  --   TIBC 503*  --   IRON 19*  --   RETICCTPCT 4.0*  --    Urine analysis: No results found for: COLORURINE, APPEARANCEUR, LABSPEC, PHURINE, GLUCOSEU, HGBUR, BILIRUBINUR, KETONESUR, PROTEINUR, UROBILINOGEN, NITRITE, LEUKOCYTESUR Sepsis Labs: @LABRCNTIP (procalcitonin:4,lacticidven:4) )No results found for this or any previous visit (from the past 240 hour(s)).       Radiology Studies: No results found.    Scheduled Meds: . amiodarone  200 mg Oral Daily  . atenolol  50 mg Oral BID  . diltiazem  180 mg Oral Daily  . furosemide  40 mg Oral Daily  . pantoprazole  40 mg Oral Daily  . potassium chloride SA  20 mEq Oral BID  . sodium chloride flush  3 mL Intravenous Q12H  . umeclidinium-vilanterol  1 puff Inhalation Daily   Continuous Infusions:   LOS: 1 day    Time spent in minutes: 35 min    Idaliz Tinkle, MD [facility] Pager: [email or web address] Password [email or web address] 08/22/2016, 9:46 AM

## 2016-08-22 NOTE — Progress Notes (Signed)
Patient refuses our CPAP per RN

## 2016-08-22 NOTE — Consult Note (Addendum)
Admit date: 08/21/2016 Referring Physician  Dr. Butler [place] Primary Physician  Dr. Arn Medal Primary Cardiologist  Dr. Peter [place] Reason for Consultation  Chest pain  HPI: This is a 71 y.o. male with medical history significant of A.Fib, patient was recently changed from coumadin to Xarelto.  4 days ago he developed BRBPR.  This went on for 4 days before, last 2 BMs have been normal.  He went to PCPs office for this and had labs drawn.  WBC was 20.7k and HGB 9.1.  He was called and informed of the results and told to come in to the ED as his baseline HGB was over 15 a year ago.  Hbg in ER was 8.8.  He has a history of remote cardiac cath in 1998 that was normal.  He also had a history of HTN and chronic venous insuff.  He has a remote history of tobacco use (60 pack-year).  He says that all of his brothers have died of CAD and MIs.  Cardiology is consulted for cardiac clearance to undergo endoscopic procedure.  He has been having exertional chest pain for a while now that he says will become severe to the point he cannot move the pain is so bad.  It is exertional and resolves after he rests for a while.  He also has noticed that he has had worsening SOB recently as well.  He denies any nausea or diaphoresis with the pain but does get SOB.  According to Dr. Illa Level last note from December, the patient has a long history of chronic chest pain evaluated by cardiac cath, CT and stress tests.  His last stress test was in 2012.       PMH:   Past Medical History:  Diagnosis Date  . Chronic anticoagulation   . Chronic back pain   . Chronic chest pain   . Chronic venous insufficiency   . Emphysema   . HTN (hypertension)   . Obesity   . PAF (paroxysmal atrial fibrillation) (HCC)   . Pneumothorax   . Rheumatoid arthritis(714.0)      PSH:   Past Surgical History:  Procedure Laterality Date  . ablation    . CARDIAC CATHETERIZATION  1998   NORMAL  . HERNIA REPAIR    . PLEURAL SCARIFICATION      . VEIN LIGATION AND STRIPPING      Allergies:  Patient has no known allergies. Prior to Admit Meds:   Prescriptions Prior to Admission  Medication Sig Dispense Refill Last Dose  . albuterol (PROVENTIL HFA;VENTOLIN HFA) 108 (90 Base) MCG/ACT inhaler Inhale 2 puffs into the lungs every 4 (four) hours as needed for wheezing or shortness of breath.   08/20/2016 at Unknown time  . amiodarone (PACERONE) 200 MG tablet Take 200 mg by mouth every evening.   08/19/2016 at Unknown  . atenolol (TENORMIN) 50 MG tablet Take 1 tablet (50 mg total) by mouth 2 (two) times daily. 180 tablet 3 08/20/2016 at 0600  . diltiazem (CARDIZEM CD) 180 MG 24 hr capsule Take 180 mg by mouth every evening.   08/19/2016 at Unknown  . furosemide (LASIX) 40 MG tablet Take 1 tablet (40 mg total) by mouth daily. 90 tablet 3 08/20/2016 at Unknown time  . hydrochlorothiazide (HYDRODIURIL) 25 MG tablet Take 1 tablet (25 mg total) by mouth daily. 90 tablet 3 08/20/2016 at Unknown time  . HYDROcodone-acetaminophen (NORCO/VICODIN) 5-325 MG per tablet Take 1 tablet by mouth every 6 (six) hours as needed  for moderate pain.    Past Month at Unknown time  . metFORMIN (GLUCOPHAGE-XR) 500 MG 24 hr tablet Take 500 mg by mouth 2 (two) times daily.    08/21/2016 at Unknown time  . omeprazole (PRILOSEC) 20 MG capsule Take 1 capsule (20 mg total) by mouth daily. 90 capsule 3 08/20/2016 at Unknown time  . potassium chloride SA (KLOR-CON M20) 20 MEQ tablet Take 1 tablet (20 mEq total) by mouth 2 (two) times daily. 180 tablet 3 08/20/2016 at Unknown time  . rivaroxaban (XARELTO) 20 MG TABS tablet Take 1 tablet (20 mg total) by mouth daily with supper. 30 tablet 6 08/19/2016 at 1800  . spironolactone (ALDACTONE) 25 MG tablet Take 1 tablet (25 mg total) by mouth daily. 90 tablet 3 08/20/2016 at Unknown time  . umeclidinium-vilanterol (ANORO ELLIPTA) 62.5-25 MCG/INH AEPB Inhale 1 puff into the lungs daily.   08/20/2016 at Unknown time   Fam HX:    Family History  Problem  Relation Age of Onset  . Other Father     CEREBRAL THROMBOSIS  . Cancer Mother     bone cancer  . Rheum arthritis Mother   . Heart failure Brother 50  . Coronary artery disease Brother 57    PTCA  . Rheum arthritis Brother   . Rheum arthritis Brother    Social HX:    Social History   Social History  . Marital status: Married    Spouse name: N/A  . Number of children: 3  . Years of education: N/A   Occupational History  . forklift operator [name]   Social History Main Topics  . Smoking status: Former Smoker    Packs/day: 2.00    Years: 30.00    Types: Cigarettes    Quit date: 03/14/1996  . Smokeless tobacco: Never Used  . Alcohol use Yes     Comment: one beer per month  . Drug use: No  . Sexual activity: No   Other Topics Concern  . Not on file   Social History Narrative  . No narrative on file     ROS:  All 11 ROS were addressed and are negative except what is stated in the HPI  Physical Exam: Blood pressure (!) 139/41, pulse 61, temperature 99.1 F (37.3 C), temperature source Oral, resp. rate 18, height 6\' 6"  (1.981 m), weight (!) 345 lb (156.5 kg), SpO2 99 %.    General: Well developed, well nourished, in no acute distress Head: Eyes PERRLA, No xanthomas.   Normal cephalic and atramatic  Lungs:   Clear bilaterally to auscultation and percussion. Heart:   HRRR S1 S2 Pulses are 2+ & equal.            No carotid bruit. No JVD.  No abdominal bruits. No femoral bruits. Abdomen: Bowel sounds are positive, abdomen soft and non-tender without masses Msk:  Back normal, normal gait. Normal strength and tone for age. Extremities:   No clubbing, cyanosis or edema.  DP +1 Neuro: Alert and oriented X 3. Psych:  Good affect, responds appropriately    Labs:   Lab Results  Component Value Date   WBC 15.0 (H) 08/22/2016   HGB 8.6 (L) 08/22/2016   HCT 29.0 (L) 08/22/2016   MCV 81.9 08/22/2016   PLT 102 (L) 08/22/2016    Recent Labs Lab 08/20/16 2230  08/22/16 0541  NA 137 138  K 3.7 3.9  CL 102 105  CO2 26 25  BUN 22* 18  CREATININE 1.15  1.01  CALCIUM 9.1 9.0  PROT 7.8  --   BILITOT 0.7  --   ALKPHOS 58  --   ALT 14*  --   AST 18  --   GLUCOSE 160* 153*   No results found for: PTT Lab Results  Component Value Date   INR 1.80 08/20/2016   INR 1.4 08/05/2016   INR 1.5 07/10/2016   Lab Results  Component Value Date   TROPONINI <0.30 10/21/2011     Lab Results  Component Value Date   CHOL 163 09/24/2015   Lab Results  Component Value Date   HDL 29 (L) 09/24/2015   Lab Results  Component Value Date   LDLCALC 99 09/24/2015   Lab Results  Component Value Date   TRIG 174 (H) 09/24/2015   Lab Results  Component Value Date   CHOLHDL 5.6 (H) 09/24/2015   No results found for: LDLDIRECT    Radiology:  No results found.  EKG:  None done  ASSESSMENT/PLAN:   1.  Acute GI bleed - no off Xarelto.  GI wishes to do endoscopic procedure but due to exertional anginal symptoms wants GI eval first.  Hbg stable this am at 8.6 and has not had any further dark or bloody stools.  2.  Exertional angina - he has classic symptoms for angina and has multiple CRFs including a very strong family history of CAD with all his brothers deceased from MIs.  He also is morbidly obese and has HTN.  He has a remote history of tobacco abuse up to 60 pack years.  He does have a history of atypical chronic chest pain that he has had for years but the symptoms he says he is having now are different and new.  He is NPO so recommend getting 2 day Lexiscan myoview to rule out ischemia. His Hbg is adequate for stress testing.  I will get a stat troponin now.  We will get resting images today and stress tomorrow.  I will also check a 2D echo to make sure that LVF is normal.  3.  Persistent atrial fibrillation maintaining NSR.  He is currently off Xarelto due to GI bleeding.  CHADS2VASC score is 2.  Continue Amiodarone, BB and CCB.  4.  HTN - BP  controlled on current meds.  Continue Atenolol, cardizem and aldactone.  Armanda Magic, MD  08/22/2016  7:23 AM

## 2016-08-22 NOTE — Progress Notes (Signed)
No bleeding. Hemoglobin stable. Currently at [facility] for cardiac stress test.  Dr. Evette Cristal will round on patient for [place] this weekend and determine whether putting him on for colonoscopic evaluation next week as appropriate.  Florencia Reasons, M.D. Pager [phone] If no answer or after 5 PM call [phone]

## 2016-08-23 ENCOUNTER — Inpatient Hospital Stay (HOSPITAL_COMMUNITY): Payer: Medicare Other

## 2016-08-23 DIAGNOSIS — I480 Paroxysmal atrial fibrillation: Secondary | ICD-10-CM

## 2016-08-23 DIAGNOSIS — E119 Type 2 diabetes mellitus without complications: Secondary | ICD-10-CM

## 2016-08-23 DIAGNOSIS — R079 Chest pain, unspecified: Secondary | ICD-10-CM

## 2016-08-23 DIAGNOSIS — D62 Acute posthemorrhagic anemia: Secondary | ICD-10-CM

## 2016-08-23 DIAGNOSIS — Z7901 Long term (current) use of anticoagulants: Secondary | ICD-10-CM

## 2016-08-23 LAB — URINALYSIS, ROUTINE W REFLEX MICROSCOPIC
BACTERIA UA: NONE SEEN
BILIRUBIN URINE: NEGATIVE
Glucose, UA: NEGATIVE mg/dL
Hgb urine dipstick: NEGATIVE
KETONES UR: NEGATIVE mg/dL
Nitrite: NEGATIVE
Protein, ur: NEGATIVE mg/dL
Specific Gravity, Urine: 1.015 (ref 1.005–1.030)
pH: 8 (ref 5.0–8.0)

## 2016-08-23 LAB — CBC
HCT: 29.9 % — ABNORMAL LOW (ref 39.0–52.0)
HEMATOCRIT: 28.2 % — AB (ref 39.0–52.0)
Hemoglobin: 8.3 g/dL — ABNORMAL LOW (ref 13.0–17.0)
Hemoglobin: 8.6 g/dL — ABNORMAL LOW (ref 13.0–17.0)
MCH: 24.1 pg — ABNORMAL LOW (ref 26.0–34.0)
MCH: 23.2 pg — AB (ref 26.0–34.0)
MCHC: 29.4 g/dL — ABNORMAL LOW (ref 30.0–36.0)
MCHC: 28.8 g/dL — ABNORMAL LOW (ref 30.0–36.0)
MCV: 81.7 fL (ref 78.0–100.0)
MCV: 80.6 fL (ref 78.0–100.0)
PLATELETS: 100 10*3/uL — AB (ref 150–400)
Platelets: 122 10*3/uL — ABNORMAL LOW (ref 150–400)
RBC: 3.45 MIL/uL — AB (ref 4.22–5.81)
RBC: 3.71 MIL/uL — AB (ref 4.22–5.81)
RDW: 18.1 % — ABNORMAL HIGH (ref 11.5–15.5)
RDW: 17.8 % — ABNORMAL HIGH (ref 11.5–15.5)
WBC: 13.5 10*3/uL — AB (ref 4.0–10.5)
WBC: 14 10*3/uL — AB (ref 4.0–10.5)

## 2016-08-23 LAB — NM MYOCAR MULTI W/SPECT W/WALL MOTION / EF
CHL CUP RESTING HR STRESS: 62 {beats}/min
CSEPEDS: 0 s
CSEPHR: 46 %
CSEPPHR: 70 {beats}/min
Estimated workload: 1 METS
Exercise duration (min): 0 min
MPHR: 150 {beats}/min

## 2016-08-23 LAB — ECHOCARDIOGRAM COMPLETE
Height: 78 in
Weight: 5489.6 oz

## 2016-08-23 LAB — TROPONIN I
Troponin I: <0.03 ng/mL (ref <0.03)
Troponin I: <0.03 ng/mL (ref <0.03)

## 2016-08-23 MED ORDER — PERFLUTREN LIPID MICROSPHERE
1.0000 mL | INTRAVENOUS | Status: AC | PRN
  Administered 2016-08-23: 2 mL via INTRAVENOUS
  Filled 2016-08-23: qty 10

## 2016-08-23 MED ORDER — TECHNETIUM TC 99M TETROFOSMIN IV KIT
30.0000 | PACK | Freq: Once | INTRAVENOUS | Status: AC | PRN
  Administered 2016-08-23: 30 via INTRAVENOUS

## 2016-08-23 MED ORDER — REGADENOSON 0.4 MG/5ML IV SOLN
INTRAVENOUS | Status: AC
  Filled 2016-08-23: qty 5

## 2016-08-23 MED ORDER — REGADENOSON 0.4 MG/5ML IV SOLN
0.4000 mg | Freq: Once | INTRAVENOUS | Status: AC
  Administered 2016-08-23: 0.4 mg via INTRAVENOUS
  Filled 2016-08-23: qty 5

## 2016-08-23 NOTE — Progress Notes (Signed)
   [name] presented for a Lexiscan cardiolite today.  No immediate complications.  Stress imaging is pending at this time.  Ellsworth Lennox, PA-C 08/23/2016, 10:09 AM

## 2016-08-23 NOTE — Progress Notes (Signed)
PROGRESS NOTE    JAIREN GOLDFARB   ERX:[id number]  DOB: 24-Feb-1946  DOA: 08/21/2016 PCP: Fraser Din, PA-C   Brief Narrative:  [name] is a 71 y.o. male with medical history significant of A.Fib, patient was recently changed from coumadin to Xarelto.  4 days ago he developed BRBPR.  This went on for 4 days before resolving 24 hours ago, last 2 BMs have been normal.  He went to PCPs office yesterday afternoon for this and had labs drawn.  WBC was 20.7k and HGB 9.1.  He was called and informed of the results about 4 hours ago and told to come in to the ED as his baseline HGB was over 15 a year ago.  Subjective: Had another bloody stool today.   Assessment & Plan:   Principal Problem:    BRBPR     Acute blood loss anemia with Iron deficiency    Chronic anticoagulation with NOAC - Hb stable at 8 since admission  - Xarelto on hold - GI has been called- clear liquids for now- plan to to scope on Monday under anesthesia after cardiology work up is complete - per GI he needs anaesthesia due to OSA/ COPD and morbid obesity - Anemia panel shows low Ferretin level and elevated TIBC consistent with Iron deficiency-  ordered IV Iron today - appears to still be passing blood- cont to follow Hb  Active Problems: Chest tightness with exertion - called cardiology-  stress myoview 2 day study ordered by Dr Mayford Knife- f/u on ECHO as well  Leukocytosis, anemia and thrombocytopenia - smear shows increased neutrophils, target cells, large platelets-  d/w hematology- per Dr Pamelia Hoit, appears to be chronic inflammation- requested CRP which is only 1.2- he also recommended flow cytometry which I have ordered- he will f/u with him as outpt. - with iron studies showing Iron deficiency today  A-fib CHA2DS2-VASc Score 3 - cont Amiodarone, Diltiazem, Atenolol - holding Xarelto for now- hold off on starting Heparin for bridging     HTN (hypertension) - Atenolol  Pedal edema -  On Lasix, HCTZ, Aldactone at  home- hold HCTZ and Aldactone for now- cont Lasix - weight 158 >>> 155    DM2 (diabetes mellitus, type 2)   - Hold metformin- cont SSI   Code Status: Full code DVT prophylaxis: SCDs Family Communication:  Disposition Plan: GI to follow Consultants:   GI Procedures:    Antimicrobials:  Anti-infectives    None       Objective: Vitals:   08/23/16 0950 08/23/16 1000 08/23/16 1002 08/23/16 1004  BP: (!) 132/56 (!) 146/101 (!) 137/44 (!) 146/45  Pulse: 69 69 68 69  Resp: 18 (!) 28 (!) 24 20  Temp:      TempSrc:      SpO2:      Weight:      Height:        Intake/Output Summary (Last 24 hours) at 08/23/16 1030 Last data filed at 08/22/16 1447  Gross per 24 hour  Intake              115 ml  Output                0 ml  Net              115 ml   Filed Weights   08/22/16 0540 08/23/16 0614 08/23/16 0630  Weight: (!) 156.5 kg (345 lb) (!) 156.7 kg (345 lb 7.4 oz) (!) 155.6 kg (  343 lb 1.6 oz)    Examination: General exam: Appears comfortable  HEENT: PERRLA, oral mucosa moist, no sclera icterus or thrush Respiratory system: Clear to auscultation. Respiratory effort normal. Cardiovascular system: S1 & S2 heard, RRR.  No murmurs  Gastrointestinal system: Abdomen soft, non-tender, nondistended. Normal bowel sound. No organomegaly Central nervous system: Alert and oriented. No focal neurological deficits. Extremities: No cyanosis, clubbing or edema Skin: No rashes or ulcers Psychiatry:  Mood & affect appropriate.     Data Reviewed: I have personally reviewed following labs and imaging studies  CBC:  Recent Labs Lab 08/20/16 2230 08/21/16 0546 08/21/16 0928 08/22/16 0541 08/23/16 0525  WBC 19.4* 18.1* 18.2* 15.0* 13.5*  NEUTROABS  --   --  12.7* 9.7*  --   HGB 8.8* 8.2* 8.5* 8.6* 8.3*  HCT 30.2* 28.4* 29.0* 29.0* 28.2*  MCV 82.3 82.3 82.6 81.9 81.7  PLT 126* 101* 129* 102* 122*   Basic Metabolic Panel:  Recent Labs Lab 08/20/16 2230 08/22/16 0541  NA  137 138  K 3.7 3.9  CL 102 105  CO2 26 25  GLUCOSE 160* 153*  BUN 22* 18  CREATININE 1.15 1.01  CALCIUM 9.1 9.0   GFR: Estimated Creatinine Clearance: 112.7 mL/min (by C-G formula based on SCr of 1.01 mg/dL). Liver Function Tests:  Recent Labs Lab 08/20/16 2230  AST 18  ALT 14*  ALKPHOS 58  BILITOT 0.7  PROT 7.8  ALBUMIN 3.8   No results for input(s): LIPASE, AMYLASE in the last 168 hours. No results for input(s): AMMONIA in the last 168 hours. Coagulation Profile:  Recent Labs Lab 08/20/16 2230  INR 1.80   Cardiac Enzymes:  Recent Labs Lab 08/22/16 2208 08/23/16 0525  TROPONINI <0.03 <0.03   BNP (last 3 results) No results for input(s): PROBNP in the last 8760 hours. HbA1C: No results for input(s): HGBA1C in the last 72 hours. CBG: No results for input(s): GLUCAP in the last 168 hours. Lipid Profile: No results for input(s): CHOL, HDL, LDLCALC, TRIG, CHOLHDL, LDLDIRECT in the last 72 hours. Thyroid Function Tests: No results for input(s): TSH, T4TOTAL, FREET4, T3FREE, THYROIDAB in the last 72 hours. Anemia Panel:  Recent Labs  08/21/16 1801 08/22/16 0541  VITAMINB12  --  771  FOLATE 14.2  --   FERRITIN 17*  --   TIBC 503*  --   IRON 19*  --   RETICCTPCT 4.0*  --    Urine analysis:    Component Value Date/Time   COLORURINE YELLOW 08/23/2016 0847   APPEARANCEUR CLEAR 08/23/2016 0847   LABSPEC 1.015 08/23/2016 0847   PHURINE 8.0 08/23/2016 0847   GLUCOSEU NEGATIVE 08/23/2016 0847   HGBUR NEGATIVE 08/23/2016 0847   BILIRUBINUR NEGATIVE 08/23/2016 0847   KETONESUR NEGATIVE 08/23/2016 0847   PROTEINUR NEGATIVE 08/23/2016 0847   NITRITE NEGATIVE 08/23/2016 0847   LEUKOCYTESUR TRACE (A) 08/23/2016 0847   Sepsis Labs: @LABRCNTIP (procalcitonin:4,lacticidven:4) )No results found for this or any previous visit (from the past 240 hour(s)).       Radiology Studies: No results found.    Scheduled Meds: . amiodarone  200 mg Oral Daily  .  atenolol  50 mg Oral BID  . diltiazem  180 mg Oral Daily  . furosemide  40 mg Oral Daily  . pantoprazole  40 mg Oral Daily  . potassium chloride SA  20 mEq Oral BID  . sodium chloride flush  3 mL Intravenous Q12H  . umeclidinium-vilanterol  1 puff Inhalation Daily   Continuous  Infusions:   LOS: 2 days    Time spent in minutes: 35 min    Lucio Litsey, MD [facility] Pager: [email or web address] Password [email or web address] 08/23/2016, 10:30 AM

## 2016-08-23 NOTE — Progress Notes (Addendum)
  Reviewed echocardiogram and stress test results with the patient. Stress test read as showing:   1. Small region of potential ischemia in the apical segment of the lateral wall.  2. Normal left ventricular wall motion.  3. Left ventricular ejection fraction 60%  4. Non invasive risk stratification*: Low  Reviewed images with Dr. Anne Fu who agreed with the above interpretation. Medical management recommended. With current GIB and associated anemia, would not pursue further ischemic evaluation at this time prior to scheduled GI procedures.  Signed, Ellsworth Lennox, PA-C 08/23/2016, 4:40 PM

## 2016-08-23 NOTE — Progress Notes (Signed)
Progress Note  Patient Name: [name] Date of Encounter: 08/23/2016  Primary Cardiologist: Dr. Peter Swaziland  Subjective   No chest pain or shortness of breath. Reports still experiencing hematuria and hematochezia.  Inpatient Medications    Scheduled Meds: . amiodarone  200 mg Oral Daily  . atenolol  50 mg Oral BID  . diltiazem  180 mg Oral Daily  . furosemide  40 mg Oral Daily  . pantoprazole  40 mg Oral Daily  . potassium chloride SA  20 mEq Oral BID  . sodium chloride flush  3 mL Intravenous Q12H  . umeclidinium-vilanterol  1 puff Inhalation Daily    PRN Meds: albuterol, HYDROcodone-acetaminophen   Vital Signs    Vitals:   08/22/16 1300 08/22/16 2027 08/23/16 0614 08/23/16 0630  BP: (!) 121/52 128/62 (!) 117/42   Pulse: 61 62 (!) 57   Resp: 18 19 19    Temp: 97.7 F (36.5 C) 97.8 F (36.6 C) 98 F (36.7 C)   TempSrc: Oral Oral Oral   SpO2: 98% 99% 98%   Weight:   (!) 345 lb 7.4 oz (156.7 kg) (!) 343 lb 1.6 oz (155.6 kg)  Height:        Intake/Output Summary (Last 24 hours) at 08/23/16 0734 Last data filed at 08/22/16 1447  Gross per 24 hour  Intake              115 ml  Output                0 ml  Net              115 ml   Filed Weights   08/22/16 0540 08/23/16 0614 08/23/16 0630  Weight: (!) 345 lb (156.5 kg) (!) 345 lb 7.4 oz (156.7 kg) (!) 343 lb 1.6 oz (155.6 kg)    Telemetry    I personally reviewed telemetry which shows sinus rhythm.  ECG    I personally reviewed the tracing from 07/10/2016 which showed sinus rhythm with prolonged PR interval and left anterior fascicular block.  Physical Exam   GEN: Morbidly obese male, no acute distress. Neck: No JVD Cardiac: RRR, no murmurs, rubs, or gallops.  Respiratory: Clear to auscultation bilaterally. GI: Soft, nontender, non-distended  MS:  Venous insufficiency; No deformity.  Labs    Chemistry Recent Labs Lab 08/20/16 2230 08/22/16 0541  NA 137 138  K 3.7 3.9  CL 102 105  CO2 26  25  GLUCOSE 160* 153*  BUN 22* 18  CREATININE 1.15 1.01  CALCIUM 9.1 9.0  PROT 7.8  --   ALBUMIN 3.8  --   AST 18  --   ALT 14*  --   ALKPHOS 58  --   BILITOT 0.7  --   GFRNONAA >60 >60  GFRAA >60 >60  ANIONGAP 9 8     Hematology Recent Labs Lab 08/21/16 0928 08/21/16 1801 08/22/16 0541 08/23/16 0525  WBC 18.2*  --  15.0* 13.5*  RBC 3.51* 3.67* 3.54* 3.45*  HGB 8.5*  --  8.6* 8.3*  HCT 29.0*  --  29.0* 28.2*  MCV 82.6  --  81.9 81.7  MCH 24.2*  --  24.3* 24.1*  MCHC 29.3*  --  29.7* 29.4*  RDW 18.1*  --  17.9* 18.1*  PLT 129*  --  102* 122*    Cardiac Enzymes Recent Labs Lab 08/22/16 2208 08/23/16 0525  TROPONINI <0.03 <0.03   Radiology    No results found.  Cardiac Studies   Myoview and echocardiogram pending.  Patient Profile     71 y.o. male with history of paroxysmal atrial fibrillation managed with amiodarone, recently changed from Coumadin to Xarelto, now presenting with acute GI bleed. Endoscopy being considered although cardiac ischemic workup requested due to exertional angina symptoms.  Assessment & Plan    1. GI bleed, hemoglobin down to 8.3. Patient also reporting hematuria. Xarelto has been held and he is being evaluated by the GI service for endoscopy.  2. Exertional angina. Patient has family history of CAD, also morbid obesity and hypertension with remote tobacco use history. He is undergoing evaluation including a 2 day Myoview as well as echocardiogram, results pending.  3. Paroxysmal atrial fibrillation, currently in sinus rhythm on amiodarone, atenolol, and Cardizem CD. CHADSVASC score is 2. Xarelto currently held.  4. Essential hypertension, blood pressure adequately controlled.  Patient is completing second day of Myoview study today, echocardiogram also pending. We will follow-up on results with further recommendations.  Signed, Nona Dell, MD  08/23/2016, 7:34 AM

## 2016-08-23 NOTE — Progress Notes (Signed)
  Echocardiogram 2D Echocardiogram with Definity has been performed.  Nolon Rod 08/23/2016, 9:42 AM

## 2016-08-23 NOTE — Progress Notes (Signed)
Patient had two bloody stool.

## 2016-08-23 NOTE — Progress Notes (Signed)
Patient brought his home CPAP to wear. RT checked Cord for any problems. RT will call service response to have someone check it.

## 2016-08-24 ENCOUNTER — Other Ambulatory Visit: Payer: Self-pay

## 2016-08-24 DIAGNOSIS — I10 Essential (primary) hypertension: Secondary | ICD-10-CM

## 2016-08-24 LAB — CBC
HCT: 28.8 % — ABNORMAL LOW (ref 39.0–52.0)
HCT: 29.7 % — ABNORMAL LOW (ref 39.0–52.0)
HEMOGLOBIN: 8.4 g/dL — AB (ref 13.0–17.0)
Hemoglobin: 8.6 g/dL — ABNORMAL LOW (ref 13.0–17.0)
MCH: 23.9 pg — AB (ref 26.0–34.0)
MCH: 23.7 pg — ABNORMAL LOW (ref 26.0–34.0)
MCHC: 29.2 g/dL — AB (ref 30.0–36.0)
MCHC: 29 g/dL — AB (ref 30.0–36.0)
MCV: 82.1 fL (ref 78.0–100.0)
MCV: 81.8 fL (ref 78.0–100.0)
PLATELETS: 133 10*3/uL — AB (ref 150–400)
Platelets: 121 10*3/uL — ABNORMAL LOW (ref 150–400)
RBC: 3.51 MIL/uL — AB (ref 4.22–5.81)
RBC: 3.63 MIL/uL — ABNORMAL LOW (ref 4.22–5.81)
RDW: 18.2 % — ABNORMAL HIGH (ref 11.5–15.5)
RDW: 18.2 % — AB (ref 11.5–15.5)
WBC: 14.5 10*3/uL — ABNORMAL HIGH (ref 4.0–10.5)
WBC: 19.3 10*3/uL — AB (ref 4.0–10.5)

## 2016-08-24 LAB — URINE CULTURE

## 2016-08-24 MED ORDER — CARVEDILOL 6.25 MG PO TABS
6.2500 mg | ORAL_TABLET | Freq: Two times a day (BID) | ORAL | Status: DC
  Administered 2016-08-24: 6.25 mg via ORAL
  Filled 2016-08-24 (×2): qty 1

## 2016-08-24 MED ORDER — PEG 3350-KCL-NA BICARB-NACL 420 G PO SOLR
4000.0000 mL | Freq: Once | ORAL | Status: AC
  Administered 2016-08-24: 4000 mL via ORAL

## 2016-08-24 MED ORDER — CARVEDILOL 3.125 MG PO TABS: 3.1250 mg | ORAL_TABLET | Freq: Two times a day (BID) | ORAL | Status: DC

## 2016-08-24 NOTE — Progress Notes (Signed)
Progress Note  Patient Name: [name] Date of Encounter: 08/24/2016  Primary Cardiologist: Dr. Peter Swaziland  Subjective   No chest pain or shortness of breath.  Inpatient Medications    Scheduled Meds: . amiodarone  200 mg Oral Daily  . atenolol  50 mg Oral BID  . diltiazem  180 mg Oral Daily  . furosemide  40 mg Oral Daily  . pantoprazole  40 mg Oral Daily  . potassium chloride SA  20 mEq Oral BID  . sodium chloride flush  3 mL Intravenous Q12H  . umeclidinium-vilanterol  1 puff Inhalation Daily    PRN Meds: albuterol, HYDROcodone-acetaminophen   Vital Signs    Vitals:   08/23/16 1153 08/23/16 1522 08/23/16 2004 08/24/16 0443  BP: (!) 115/49 (!) 116/47 (!) 113/48 (!) 129/43  Pulse: 62 (!) 56 (!) 54 (!) 59  Resp: 19 18 20 19   Temp: 98.3 F (36.8 C) 98.7 F (37.1 C) 97.5 F (36.4 C) 97.6 F (36.4 C)  TempSrc: Oral Oral Oral Oral  SpO2: 97% 98% 99% 99%  Weight:    (!) 342 lb 14.4 oz (155.5 kg)  Height:       No intake or output data in the 24 hours ending 08/24/16 0722 Filed Weights   08/23/16 0614 08/23/16 0630 08/24/16 0443  Weight: (!) 345 lb 7.4 oz (156.7 kg) (!) 343 lb 1.6 oz (155.6 kg) (!) 342 lb 14.4 oz (155.5 kg)    Telemetry    I personally reviewed telemetry which shows sinus rhythm. There are occasional nonconducted PACs and also a single episode of brief type 1-2 heart block. No complete heart block.  ECG    I personally reviewed the tracing from 07/10/2016 which showed sinus rhythm with prolonged PR interval and left anterior fascicular block.  Physical Exam   GEN: Morbidly obese male, no acute distress. Neck: No JVD Cardiac: RRR, no murmurs, rubs, or gallops.  Respiratory: Clear to auscultation bilaterally. GI: Soft, nontender, non-distended  MS:  Venous insufficiency; No deformity.  Labs    Chemistry  Recent Labs Lab 08/20/16 2230 08/22/16 0541  NA 137 138  K 3.7 3.9  CL 102 105  CO2 26 25  GLUCOSE 160* 153*  BUN 22*  18  CREATININE 1.15 1.01  CALCIUM 9.1 9.0  PROT 7.8  --   ALBUMIN 3.8  --   AST 18  --   ALT 14*  --   ALKPHOS 58  --   BILITOT 0.7  --   GFRNONAA >60 >60  GFRAA >60 >60  ANIONGAP 9 8     Hematology  Recent Labs Lab 08/23/16 0525 08/23/16 1240 08/24/16 0502  WBC 13.5* 14.0* 14.5*  RBC 3.45* 3.71* 3.51*  HGB 8.3* 8.6* 8.4*  HCT 28.2* 29.9* 28.8*  MCV 81.7 80.6 82.1  MCH 24.1* 23.2* 23.9*  MCHC 29.4* 28.8* 29.2*  RDW 18.1* 17.8* 18.2*  PLT 122* 100* 121*    Cardiac Enzymes  Recent Labs Lab 08/22/16 2208 08/23/16 0525 08/23/16 1240  TROPONINI <0.03 <0.03 <0.03   Radiology    Nm Myocar Multi W/spect W/wall Motion / Ef  Result Date: 08/23/2016 CLINICAL DATA:  71 year old male with chest pain history of atrial fibrillation. Stress pharmacological EKG demonstrate no ST changes. EXAM: MYOCARDIAL IMAGING WITH SPECT (REST AND PHARMACOLOGIC-STRESS) GATED LEFT VENTRICULAR WALL MOTION STUDY LEFT VENTRICULAR EJECTION FRACTION TECHNIQUE: Standard myocardial SPECT imaging was performed after resting intravenous injection of 10 mCi Tc-14m tetrofosmin. Subsequently, intravenous infusion of Lexiscan  was performed under the supervision of the Cardiology staff. At peak effect of the drug, 30 mCi Tc-81m tetrofosmin was injected intravenously and standard myocardial SPECT imaging was performed. Quantitative gated imaging was also performed to evaluate left ventricular wall motion, and estimate left ventricular ejection fraction. COMPARISON:  None. FINDINGS: Perfusion: Small region of mild decrease counts in the apical segment of the lateral wall which improves from stress rest. No evidence of infarction. Wall Motion: Normal left ventricular wall motion. No left ventricular dilation. Left Ventricular Ejection Fraction: 60 % End diastolic volume 192 ml End systolic volume 77 ml IMPRESSION: 1. Small region of potential ischemia in the apical segment of the lateral wall. 2. Normal left ventricular  wall motion. 3. Left ventricular ejection fraction 60% 4. Non invasive risk stratification*: Low *2012 Appropriate Use Criteria for Coronary Revascularization Focused Update: J Am Coll Cardiol. 2012;59(9):857-881. http://content.[email or web address].aspx?articleid=[email or web address] Electronically Signed   By: Genevive Bi M.D.   On: 08/23/2016 16:06    Cardiac Studies   Echocardiogram 08/23/2016: Study Conclusions  - Left ventricle: The cavity size was normal. Wall thickness was   increased in a pattern of moderate LVH. Systolic function was   normal. The estimated ejection fraction was in the range of 60%   to 65%. Wall motion was normal; there were no regional wall   motion abnormalities. Features are consistent with a pseudonormal   left ventricular filling pattern, with concomitant abnormal   relaxation and increased filling pressure (grade 2 diastolic   dysfunction).  Patient Profile     71 y.o. male with history of paroxysmal atrial fibrillation managed with amiodarone, recently changed from Coumadin to Xarelto, now presenting with acute GI bleed. Endoscopy being considered although cardiac ischemic workup requested due to exertional angina symptoms.  Assessment & Plan    1. GI bleed, hemoglobin 8.4 this morning. Patient also reporting hematuria. Xarelto has been held and he is being evaluated by the GI service for endoscopy.  2. Exertional angina. Patient has family history of CAD, also morbid obesity and hypertension with remote tobacco use history. He underwent Myoview study yesterday which was low risk overall demonstrating a small region of apical lateral ischemia and normal LVEF by echocardiography.  3. Paroxysmal atrial fibrillation, currently in sinus rhythm on amiodarone, atenolol, and Cardizem CD. CHADSVASC score is 2. Xarelto currently held.  4. Essential hypertension, blood pressure adequately controlled.  5. Chart reviewed regarding concern about patient having complete  heart block on telemetry. I reviewed the strips and recent ECG. He has not had complete heart block. He has had nonconducted PACs and also a single episode of very brief type I to type II heart block that was asymptomatic. Would continue telemetry monitoring.  At this point would continue telemetry monitoring and recommend medical therapy for ischemic heart disease. Would expect that he should be able to proceed with endoscopy at an acceptable risk. Would not defer this for further cardiac evaluation in light of his GI bleeding. He does describe angina symptoms, and may ultimately need further invasive cardiac testing if he continues to have symptoms down the road. Even if he were to undergo cardiac catheterization now, we would not be able to consider all revascularization options in light of his active GI bleeding since DAPT could not be utilized.  Signed, Nona Dell, MD  08/24/2016, 7:22 AM

## 2016-08-24 NOTE — Progress Notes (Signed)
PROGRESS NOTE    [name]   IDP:[id number]  DOB: 14-Jun-1946  DOA: 08/21/2016 PCP: Fraser Din, PA-C   Brief Narrative:  [name] is a 71 y.o. male with medical history significant of A.Fib, patient was recently changed from coumadin to Xarelto.  4 days ago he developed BRBPR.  This went on for 4 days before resolving 24 hours ago, last 2 BMs have been normal.  He went to PCPs office yesterday afternoon for this and had labs drawn.  WBC was 20.7k and HGB 9.1.  He was called and informed of the results about 4 hours ago and told to come in to the ED as his baseline HGB was over 15 a year ago.  Subjective: No bloody stool in > 24 hrs- mild cough today and some lower back pain from sitting too long   Assessment & Plan:   Principal Problem:    BRBPR     Acute blood loss anemia with Iron deficiency    Chronic anticoagulation with NOAC - Hb stable at 8 since admission  - Xarelto on hold - GI has been called- clear liquids for now- plan to to scope on Monday under anesthesia after cardiology work up is complete - per GI he needs anaesthesia due to OSA/ COPD and morbid obesity - Anemia panel shows low Ferretin level and elevated TIBC consistent with Iron deficiency-  ordered IV Iron - will need another dose of Feraheme in 1-2 wks - last passed blood > 24 hrs ago cont to follow Hb off anticoagulation  Active Problems: Chest tightness with exertion - called cardiology-  stress myoview 2 day study ordered by Dr Mayford Knife which does shows a small area of reversible ischemia in apex-  per Dr Diona Browner, medical management for now as he is not a candidate for PCI with issues with GI bleed -   ECHO reveals grade 2 dCHF and NO REGIONAL WALL MOTION ABNORMALITIES  - change Atenolol to Coreg  Leukocytosis, anemia and thrombocytopenia - smear shows increased neutrophils, target cells, large platelets-  d/w hematology- per Dr Pamelia Hoit, appears to be chronic inflammation- requested CRP which is only  1.2- he also recommended flow cytometry which I have ordered- he will f/u with him as outpt. - with iron studies showing Iron deficiency today  A-fib CHA2DS2-VASc Score 3 - cont Amiodarone, Diltiazem, Atenolol - holding Xarelto for now- hold off on starting Heparin for bridging     HTN (hypertension) - Atenolol  Pedal edema/ grade 2 d CHF - see ECHO below- new diagnosis of grade 2 dCHF -  On Lasix, HCTZ, Aldactone at home- hold HCTZ and Aldactone for now- cont Lasix   - change Atenolol to Coreg - weight 158 >>> 155>>155    DM2 (diabetes mellitus, type 2)   - Hold metformin- cont SSI   Code Status: Full code DVT prophylaxis: SCDs Family Communication:  Disposition Plan: GI to follow Consultants:   GI Procedures:  Myoview stress test 2/10 1. Small region of potential ischemia in the apical segment of the lateral wall.  2. Normal left ventricular wall motion.  3. Left ventricular ejection fraction 60%  4. Non invasive risk stratification*: Low   2 D ECHO 2/10 Left ventricle: The cavity size was normal. Wall thickness was   increased in a pattern of moderate LVH. Systolic function was   normal. The estimated ejection fraction was in the range of 60%   to 65%. Wall motion was normal; there were no  regional wall   motion abnormalities. Features are consistent with a pseudonormal   left ventricular filling pattern, with concomitant abnormal   relaxation and increased filling pressure (grade 2 diastolic   dysfunction). Antimicrobials:  Anti-infectives    None       Objective: Vitals:   08/23/16 1153 08/23/16 1522 08/23/16 2004 08/24/16 0443  BP: (!) 115/49 (!) 116/47 (!) 113/48 (!) 129/43  Pulse: 62 (!) 56 (!) 54 (!) 59  Resp: 19 18 20 19   Temp: 98.3 F (36.8 C) 98.7 F (37.1 C) 97.5 F (36.4 C) 97.6 F (36.4 C)  TempSrc: Oral Oral Oral Oral  SpO2: 97% 98% 99% 99%  Weight:    (!) 155.5 kg (342 lb 14.4 oz)  Height:       No intake or output data in the  24 hours ending 08/24/16 0921 Filed Weights   08/23/16 0614 08/23/16 0630 08/24/16 0443  Weight: (!) 156.7 kg (345 lb 7.4 oz) (!) 155.6 kg (343 lb 1.6 oz) (!) 155.5 kg (342 lb 14.4 oz)    Examination: General exam: Appears comfortable  HEENT: PERRLA, oral mucosa moist, no sclera icterus or thrush Respiratory system: Clear to auscultation. Respiratory effort normal. Cardiovascular system: S1 & S2 heard, RRR.  No murmurs  Gastrointestinal system: Abdomen soft, non-tender, nondistended. Normal bowel sound. No organomegaly Central nervous system: Alert and oriented. No focal neurological deficits. Extremities: No cyanosis, clubbing or edema Skin: No rashes or ulcers Psychiatry:  Mood & affect appropriate.     Data Reviewed: I have personally reviewed following labs and imaging studies  CBC:  Recent Labs Lab 08/21/16 0928 08/22/16 0541 08/23/16 0525 08/23/16 1240 08/24/16 0502  WBC 18.2* 15.0* 13.5* 14.0* 14.5*  NEUTROABS 12.7* 9.7*  --   --   --   HGB 8.5* 8.6* 8.3* 8.6* 8.4*  HCT 29.0* 29.0* 28.2* 29.9* 28.8*  MCV 82.6 81.9 81.7 80.6 82.1  PLT 129* 102* 122* 100* 121*   Basic Metabolic Panel:  Recent Labs Lab 08/20/16 2230 08/22/16 0541  NA 137 138  K 3.7 3.9  CL 102 105  CO2 26 25  GLUCOSE 160* 153*  BUN 22* 18  CREATININE 1.15 1.01  CALCIUM 9.1 9.0   GFR: Estimated Creatinine Clearance: 112.6 mL/min (by C-G formula based on SCr of 1.01 mg/dL). Liver Function Tests:  Recent Labs Lab 08/20/16 2230  AST 18  ALT 14*  ALKPHOS 58  BILITOT 0.7  PROT 7.8  ALBUMIN 3.8   No results for input(s): LIPASE, AMYLASE in the last 168 hours. No results for input(s): AMMONIA in the last 168 hours. Coagulation Profile:  Recent Labs Lab 08/20/16 2230  INR 1.80   Cardiac Enzymes:  Recent Labs Lab 08/22/16 2208 08/23/16 0525 08/23/16 1240  TROPONINI <0.03 <0.03 <0.03   BNP (last 3 results) No results for input(s): PROBNP in the last 8760 hours. HbA1C: No  results for input(s): HGBA1C in the last 72 hours. CBG: No results for input(s): GLUCAP in the last 168 hours. Lipid Profile: No results for input(s): CHOL, HDL, LDLCALC, TRIG, CHOLHDL, LDLDIRECT in the last 72 hours. Thyroid Function Tests: No results for input(s): TSH, T4TOTAL, FREET4, T3FREE, THYROIDAB in the last 72 hours. Anemia Panel:  Recent Labs  08/21/16 1801 08/22/16 0541  VITAMINB12  --  771  FOLATE 14.2  --   FERRITIN 17*  --   TIBC 503*  --   IRON 19*  --   RETICCTPCT 4.0*  --    Urine analysis:  Component Value Date/Time   COLORURINE YELLOW 08/23/2016 0847   APPEARANCEUR CLEAR 08/23/2016 0847   LABSPEC 1.015 08/23/2016 0847   PHURINE 8.0 08/23/2016 0847   GLUCOSEU NEGATIVE 08/23/2016 0847   HGBUR NEGATIVE 08/23/2016 0847   BILIRUBINUR NEGATIVE 08/23/2016 0847   KETONESUR NEGATIVE 08/23/2016 0847   PROTEINUR NEGATIVE 08/23/2016 0847   NITRITE NEGATIVE 08/23/2016 0847   LEUKOCYTESUR TRACE (A) 08/23/2016 0847   Sepsis Labs: @LABRCNTIP (procalcitonin:4,lacticidven:4) ) Recent Results (from the past 240 hour(s))  Urine culture     Status: None (Preliminary result)   Collection Time: 08/23/16  8:47 AM  Result Value Ref Range Status   Specimen Description URINE, CLEAN CATCH  Final   Special Requests NONE  Final   Culture   Final    CULTURE REINCUBATED FOR BETTER GROWTH Performed at [facility], [address], [place], [place] [address]    Report Status PENDING  Incomplete         Radiology Studies: Nm Myocar Multi W/spect W/wall Motion / Ef  Result Date: 08/23/2016 CLINICAL DATA:  71 year old male with chest pain history of atrial fibrillation. Stress pharmacological EKG demonstrate no ST changes. EXAM: MYOCARDIAL IMAGING WITH SPECT (REST AND PHARMACOLOGIC-STRESS) GATED LEFT VENTRICULAR WALL MOTION STUDY LEFT VENTRICULAR EJECTION FRACTION TECHNIQUE: Standard myocardial SPECT imaging was performed after resting intravenous injection of 10 mCi  Tc-49m tetrofosmin. Subsequently, intravenous infusion of Lexiscan was performed under the supervision of the Cardiology staff. At peak effect of the drug, 30 mCi Tc-31m tetrofosmin was injected intravenously and standard myocardial SPECT imaging was performed. Quantitative gated imaging was also performed to evaluate left ventricular wall motion, and estimate left ventricular ejection fraction. COMPARISON:  None. FINDINGS: Perfusion: Small region of mild decrease counts in the apical segment of the lateral wall which improves from stress rest. No evidence of infarction. Wall Motion: Normal left ventricular wall motion. No left ventricular dilation. Left Ventricular Ejection Fraction: 60 % End diastolic volume 192 ml End systolic volume 77 ml IMPRESSION: 1. Small region of potential ischemia in the apical segment of the lateral wall. 2. Normal left ventricular wall motion. 3. Left ventricular ejection fraction 60% 4. Non invasive risk stratification*: Low *2012 Appropriate Use Criteria for Coronary Revascularization Focused Update: J Am Coll Cardiol. 2012;59(9):857-881. http://content.[email or web address].aspx?articleid=[email or web address] Electronically Signed   By: Genevive Bi M.D.   On: 08/23/2016 16:06      Scheduled Meds: . amiodarone  200 mg Oral Daily  . atenolol  50 mg Oral BID  . diltiazem  180 mg Oral Daily  . furosemide  40 mg Oral Daily  . pantoprazole  40 mg Oral Daily  . potassium chloride SA  20 mEq Oral BID  . sodium chloride flush  3 mL Intravenous Q12H  . umeclidinium-vilanterol  1 puff Inhalation Daily   Continuous Infusions:   LOS: 3 days    Time spent in minutes: 35 min    Arelys Glassco, MD [facility] Pager: [email or web address] Password [facility] 08/24/2016, 9:21 AM

## 2016-08-24 NOTE — Progress Notes (Signed)
Called received from central tele for 3rd degree HB. Patient asymptomatic upon assessment. MD on call made aware and EKG perform. RR nurse made aware of MD order.

## 2016-08-24 NOTE — Progress Notes (Signed)
No complaints. Stress test done and cardiology has cleared him for colonoscopy based on the last note reviewed. Colonoscopy to be planned tomorrow provided we can get MAC sedation to evaluate for rectal bleeding and anemia.  

## 2016-08-24 NOTE — Progress Notes (Signed)
Called by bedside RN due to Centralized Tele calling and advising of 3rd degree HB.  RN had already done a 12-lead on my arrival which showed 1st degree not 3rd degree and had talked with on-call MD which ordered pacer at bedside and transfer to ICU.  I reviewed EKG Tele in [place] monitor and saw 1st degree with some pauses that possibly could be going back and forth between 1st degree and 2nd degree.  Lowest HR has been 58.  Pt currently sitting on side of bed, no distress, A/Ox4, denies any pain, asymptomatic.  VS stable.  On room air with sats 98%.  Tele monitor shows HR 59.  RR-18 non labored with clear lung sounds.  Called on-call MD back to advise of situation and she advised to cancel transfer orders and pacer at bedside order.  No further treatment needed at this point in time.  Roque Cash [facility] ICU/SD Care Coordinator / Rapid Response Nurse

## 2016-08-25 ENCOUNTER — Inpatient Hospital Stay (HOSPITAL_COMMUNITY): Payer: Medicare Other | Admitting: Certified Registered Nurse Anesthetist

## 2016-08-25 ENCOUNTER — Telehealth: Payer: Self-pay

## 2016-08-25 ENCOUNTER — Encounter (HOSPITAL_COMMUNITY): Payer: Self-pay | Admitting: Certified Registered Nurse Anesthetist

## 2016-08-25 ENCOUNTER — Encounter (HOSPITAL_COMMUNITY)
Admission: EM | Disposition: A | Discharge status: Home / Self Care | Payer: Self-pay | Source: Home / Self Care | Attending: Internal Medicine

## 2016-08-25 DIAGNOSIS — R0782 Intercostal pain: Secondary | ICD-10-CM

## 2016-08-25 DIAGNOSIS — K625 Hemorrhage of anus and rectum: Secondary | ICD-10-CM

## 2016-08-25 HISTORY — PX: COLONOSCOPY WITH PROPOFOL: SHX5780

## 2016-08-25 LAB — CBC
HCT: 27.1 % — ABNORMAL LOW (ref 39.0–52.0)
Hemoglobin: 8 g/dL — ABNORMAL LOW (ref 13.0–17.0)
MCH: 24.2 pg — AB (ref 26.0–34.0)
MCHC: 29.5 g/dL — AB (ref 30.0–36.0)
MCV: 81.9 fL (ref 78.0–100.0)
PLATELETS: 94 10*3/uL — AB (ref 150–400)
RBC: 3.31 MIL/uL — ABNORMAL LOW (ref 4.22–5.81)
RDW: 18.4 % — ABNORMAL HIGH (ref 11.5–15.5)
WBC: 15.4 10*3/uL — ABNORMAL HIGH (ref 4.0–10.5)

## 2016-08-25 SURGERY — COLONOSCOPY WITH PROPOFOL
Anesthesia: Monitor Anesthesia Care

## 2016-08-25 MED ORDER — RIVAROXABAN 20 MG PO TABS: 20.0000 mg | ORAL_TABLET | Freq: Every day | ORAL | Status: DC

## 2016-08-25 MED ORDER — SODIUM CHLORIDE 0.9 % IV SOLN
INTRAVENOUS | Status: DC
  Administered 2016-08-25: 06:00:00 via INTRAVENOUS

## 2016-08-25 MED ORDER — PROPOFOL 10 MG/ML IV BOLUS
INTRAVENOUS | Status: DC | PRN
  Administered 2016-08-25: 50 mg via INTRAVENOUS
  Administered 2016-08-25 (×4): 20 mg via INTRAVENOUS

## 2016-08-25 MED ORDER — LIDOCAINE 2% (20 MG/ML) 5 ML SYRINGE
INTRAMUSCULAR | Status: AC
  Filled 2016-08-25: qty 10

## 2016-08-25 MED ORDER — POLYETHYLENE GLYCOL 3350 17 G PO PACK
17.0000 g | PACK | Freq: Two times a day (BID) | ORAL | Status: DC
  Filled 2016-08-25: qty 1

## 2016-08-25 MED ORDER — PROPOFOL 500 MG/50ML IV EMUL
INTRAVENOUS | Status: DC | PRN
  Administered 2016-08-25: 100 ug/kg/min via INTRAVENOUS

## 2016-08-25 MED ORDER — POLYETHYLENE GLYCOL 3350 17 G PO PACK: 17.0000 g | PACK | Freq: Two times a day (BID) | ORAL | 0 refills | Status: DC

## 2016-08-25 MED ORDER — HYDROCORTISONE ACETATE 25 MG RE SUPP: 25.0000 mg | Freq: Two times a day (BID) | RECTAL | 0 refills | Status: DC

## 2016-08-25 MED ORDER — CARVEDILOL 6.25 MG PO TABS: 6.2500 mg | ORAL_TABLET | Freq: Two times a day (BID) | ORAL | 0 refills | Status: DC

## 2016-08-25 MED ORDER — PROPOFOL 10 MG/ML IV BOLUS
INTRAVENOUS | Status: AC
  Filled 2016-08-25: qty 60

## 2016-08-25 SURGICAL SUPPLY — 22 items

## 2016-08-25 NOTE — Discharge Instructions (Signed)
Diverticulosis Diverticulosis is the condition that develops when small pouches (diverticula) form in the wall of your colon. Your colon, or large intestine, is where water is absorbed and stool is formed. The pouches form when the inside layer of your colon pushes through weak spots in the outer layers of your colon. CAUSES  No one knows exactly what causes diverticulosis. RISK FACTORS  Being older than 50. Your risk for this condition increases with age. Diverticulosis is rare in people younger than 40 years. By age 95, almost everyone has it.  Eating a low-fiber diet.  Being frequently constipated.  Being overweight.  Not getting enough exercise.  Smoking.  Taking over-the-counter pain medicines, like aspirin and ibuprofen. SYMPTOMS  Most people with diverticulosis do not have symptoms. DIAGNOSIS  Because diverticulosis often has no symptoms, health care providers often discover the condition during an exam for other colon problems. In many cases, a health care provider will diagnose diverticulosis while using a flexible scope to examine the colon (colonoscopy). TREATMENT  If you have never developed an infection related to diverticulosis, you may not need treatment. If you have had an infection before, treatment may include:  Eating more fruits, vegetables, and grains.  Taking a fiber supplement.  Taking a live bacteria supplement (probiotic).  Taking medicine to relax your colon. HOME CARE INSTRUCTIONS   Drink at least 6-8 glasses of water each day to prevent constipation.  Try not to strain when you have a bowel movement.  Keep all follow-up appointments. If you have had an infection before:  Increase the fiber in your diet as directed by your health care provider or dietitian.  Take a dietary fiber supplement if your health care provider approves.  Only take medicines as directed by your health care provider. SEEK MEDICAL CARE IF:   You have abdominal  pain.  You have bloating.  You have cramps.  You have not gone to the bathroom in 3 days. SEEK IMMEDIATE MEDICAL CARE IF:   Your pain gets worse.  Yourbloating becomes very bad.  You have a fever or chills, and your symptoms suddenly get worse.  You begin vomiting.  You have bowel movements that are bloody or black. MAKE SURE YOU:  Understand these instructions.  Will watch your condition.  Will get help right away if you are not doing well or get worse. This information is not intended to replace advice given to you by your health care provider. Make sure you discuss any questions you have with your health care provider. Document Released: 03/27/2004 Document Revised: 07/05/2013 Document Reviewed: 05/25/2013 Elsevier Interactive Patient Education  2017 [employer]   Hemorrhoids Hemorrhoids are swollen veins in and around the rectum or anus. There are two types of hemorrhoids:  Internal hemorrhoids. These occur in the veins that are just inside the rectum. They may poke through to the outside and become irritated and painful.  External hemorrhoids. These occur in the veins that are outside of the anus and can be felt as a painful swelling or hard lump near the anus. Most hemorrhoids do not cause serious problems, and they can be managed with home treatments such as diet and lifestyle changes. If home treatments do not help your symptoms, procedures can be done to shrink or remove the hemorrhoids. What are the causes? This condition is caused by increased pressure in the anal area. This pressure may result from various things, including:  Constipation.  Straining to have a bowel movement.  Diarrhea.  Pregnancy.  Obesity.  Sitting for long periods of time.  Heavy lifting or other activity that causes you to strain.  Anal sex. What are the signs or symptoms? Symptoms of this condition include:  Pain.  Anal itching or irritation.  Rectal  bleeding.  Leakage of stool (feces).  Anal swelling.  One or more lumps around the anus. How is this diagnosed? This condition can often be diagnosed through a visual exam. Other exams or tests may also be done, such as:  Examination of the rectal area with a gloved hand (digital rectal exam).  Examination of the anal canal using a small tube (anoscope).  A blood test, if you have lost a significant amount of blood.  A test to look inside the colon (sigmoidoscopy or colonoscopy). How is this treated? This condition can usually be treated at home. However, various procedures may be done if dietary changes, lifestyle changes, and other home treatments do not help your symptoms. These procedures can help make the hemorrhoids smaller or remove them completely. Some of these procedures involve surgery, and others do not. Common procedures include:  Rubber band ligation. Rubber bands are placed at the base of the hemorrhoids to cut off the blood supply to them.  Sclerotherapy. Medicine is injected into the hemorrhoids to shrink them.  Infrared coagulation. A type of light energy is used to get rid of the hemorrhoids.  Hemorrhoidectomy surgery. The hemorrhoids are surgically removed, and the veins that supply them are tied off.  Stapled hemorrhoidopexy surgery. A circular stapling device is used to remove the hemorrhoids and use staples to cut off the blood supply to them. Follow these instructions at home: Eating and drinking  Eat foods that have a lot of fiber in them, such as whole grains, beans, nuts, fruits, and vegetables. Ask your health care provider about taking products that have added fiber (fiber supplements).  Drink enough fluid to keep your urine clear or pale yellow. Managing pain and swelling  Take warm sitz baths for 20 minutes, 3-4 times a day to ease pain and discomfort.  If directed, apply ice to the affected area. Using ice packs between sitz baths may be  helpful.  Put ice in a plastic bag.  Place a towel between your skin and the bag.  Leave the ice on for 20 minutes, 2-3 times a day. General instructions  Take over-the-counter and prescription medicines only as told by your health care provider.  Use medicated creams or suppositories as told.  Exercise regularly.  Go to the bathroom when you have the urge to have a bowel movement. Do not wait.  Avoid straining to have bowel movements.  Keep the anal area dry and clean. Use wet toilet paper or moist towelettes after a bowel movement.  Do not sit on the toilet for long periods of time. This increases blood pooling and pain. Contact a health care provider if:  You have increasing pain and swelling that are not controlled by treatment or medicine.  You have uncontrolled bleeding.  You have difficulty having a bowel movement, or you are unable to have a bowel movement.  You have pain or inflammation outside the area of the hemorrhoids. This information is not intended to replace advice given to you by your health care provider. Make sure you discuss any questions you have with your health care provider. Document Released: 06/27/2000 Document Revised: 11/28/2015 Document Reviewed: 03/14/2015 Elsevier Interactive Patient Education  2017 Else[employer] Inc[employer] YOU HAD AN ENDOSCOPIC PROCEDURE TODAY: Refer  to the procedure report and other information in the discharge instructions given to you for any specific questions about what was found during the examination. If this information does not answer your questions, please call [facility] office at [phone] to clarify.   YOU SHOULD EXPECT: Some feelings of bloating in the abdomen. Passage of more gas than usual. Walking can help get rid of the air that was put into your GI tract during the procedure and reduce the bloating. If you had a lower endoscopy (such as a colonoscopy or flexible sigmoidoscopy) you may notice spotting of blood in your  stool or on the toilet paper. Some abdominal soreness may be present for a day or two, also.  DIET: Your first meal following the procedure should be a light meal and then it is ok to progress to your normal diet. A half-sandwich or bowl of soup is an example of a good first meal. Heavy or fried foods are harder to digest and may make you feel nauseous or bloated. Drink plenty of fluids but you should avoid alcoholic beverages for 24 hours. If you had a esophageal dilation, please see attached instructions for diet.   ACTIVITY: Your care partner should take you home directly after the procedure. You should plan to take it easy, moving slowly for the rest of the day. You can resume normal activity the day after the procedure however YOU SHOULD NOT DRIVE, use power tools, machinery or perform tasks that involve climbing or major physical exertion for 24 hours (because of the sedation medicines used during the test).   SYMPTOMS TO REPORT IMMEDIATELY: A gastroenterologist can be reached at any hour. Please call [phone]  for any of the following symptoms:  Following lower endoscopy (colonoscopy, flexible sigmoidoscopy) Excessive amounts of blood in the stool  Significant tenderness, worsening of abdominal pains  Swelling of the abdomen that is new, acute  Fever of 100 or higher    FOLLOW UP:  If any biopsies were taken you will be contacted by phone or by letter within the next 1-3 weeks. Call [phone]  if you have not heard about the biopsies in 3 weeks.  Please also call with any specific questions about appointments or follow up tests.

## 2016-08-25 NOTE — Discharge Summary (Signed)
Physician Discharge Summary  [name] KKX:[id number] DOB: [birth year] DOA: 08/21/2016  PCP: Fraser Din, PA-C  Admit date: 08/21/2016 Discharge date: 08/25/2016  Admitted From: home   Disposition:  home   Recommendations for Outpatient Follow-up:  1. Needs second dose of Feraheme in 1-2 wks  Home Health:  none  Equipment/Devices:  none    Discharge Condition:  stable   CODE STATUS:  Full code   Diet recommendation:  Heart healthy, diabetic Consultations:  GI  Cardiology    Discharge Diagnoses:  Principal Problem:   BRBPR (bright red blood per rectum) Active Problems:   Acute blood loss anemia   Exertional angina (HCC)   Chronic anticoagulation   Obesity   COPD (chronic obstructive pulmonary disease) (HCC)   Sleep apnea   HTN (hypertension)   DM2 (diabetes mellitus, type 2) (HCC)    Subjective: No complaints.   Brief Summary: [name] a 71 y.o.malewith medical history significant of A.Fib, patient was recently changed from coumadin to Xarelto. 4 days ago he developed BRBPR. This went on for 4 days before resolving 24 hours ago, last 2 BMs have been normal. He went to PCPs office yesterday afternoon for this and had labs drawn. WBC was 20.7k and HGB 9.1. He was called and informed of the results about 4 hours ago and told to come in to the ED as his baseline HGB was over 15 a year ago.   Hospital Course:  Principal Problem:    BRBPR     Acute blood loss anemia with Iron deficiency    Chronic anticoagulation with NOAC - Hb stable at 8 since admission  - Xarelto was held - GI  called-  Colosocopy was postponed until Monday until after cardiology work up (for exertional dyspnea) is complete - per GI he, also needed general anaesthesia due to OSA/ COPD and morbid obesity - Anemia panel showed low Ferretin level and elevated TIBC consistent with Iron deficiency-  ordered IV Iron - will need another dose of Feraheme in 1-2 wks - last passed blood > 48  hrs ago  - Colonoscopy shows internal hemorrhoid which were the likely source of the bleed per GI- moderate amount of diverticulosis noted but not bleeding  Active Problems: Chest tightness with exertion - called cardiology-  stress myoview 2 day study ordered by Dr Mayford Knife which does shows a small area of reversible ischemia in apex-  per Dr Diona Browner, medical management for now  -   ECHO reveals grade 2 dCHF and NO REGIONAL WALL MOTION ABNORMALITIES  - change Atenolol to Coreg - he will f/u with Dr [place] as outpt  Leukocytosis, anemia and thrombocytopenia - smear shows increased neutrophils, target cells, large platelets-  d/w hematology- per Dr Pamelia Hoit, appears to be chronic inflammation- requested CRP which is only 1.2- he also recommended flow cytometry which I have ordered- he will f/u with him as outpt - with iron studies showing Iron deficiency    A-fib CHA2DS2-VASc Score 3 - cont Amiodarone, Diltiazem, Atenolol  - Held Xarelto while inpatient- resumed today    HTN (hypertension) - Atenolol  Pedal edema/ grade 2 d CHF - see ECHO below- new diagnosis of grade 2 dCHF -  On Lasix, HCTZ, Aldactone at home- held HCTZ and Aldactone in hospital - was cont on Lasix   - change Atenolol to Coreg - weight 158 >>> 155>>155>>156    DM2 (diabetes mellitus, type 2)   - Held metformin in hospital  Discharge Instructions  Discharge Instructions    Diet - low sodium heart healthy    Complete by:  As directed    Increase activity slowly    Complete by:  As directed    Increase activity slowly    Complete by:  As directed    hemorrhoids can bleed with constipation or diarrhea- you can use Hyrocortisone 1% if bleeding or pain occurs.     Allergies as of 08/25/2016   No Known Allergies     Medication List    STOP taking these medications   atenolol 50 MG tablet Commonly known as:  TENORMIN     TAKE these medications   albuterol 108 (90 Base) MCG/ACT inhaler Commonly  known as:  PROVENTIL HFA;VENTOLIN HFA Inhale 2 puffs into the lungs every 4 (four) hours as needed for wheezing or shortness of breath.   amiodarone 200 MG tablet Commonly known as:  PACERONE Take 200 mg by mouth every evening.   ANORO ELLIPTA 62.5-25 MCG/INH Aepb Generic drug:  umeclidinium-vilanterol Inhale 1 puff into the lungs daily.   carvedilol 6.25 MG tablet Commonly known as:  COREG Take 1 tablet (6.25 mg total) by mouth 2 (two) times daily with a meal.   diltiazem 180 MG 24 hr capsule Commonly known as:  CARDIZEM CD Take 180 mg by mouth every evening.   furosemide 40 MG tablet Commonly known as:  LASIX Take 1 tablet (40 mg total) by mouth daily.   hydrochlorothiazide 25 MG tablet Commonly known as:  HYDRODIURIL Take 1 tablet (25 mg total) by mouth daily.   HYDROcodone-acetaminophen 5-325 MG tablet Commonly known as:  NORCO/VICODIN Take 1 tablet by mouth every 6 (six) hours as needed for moderate pain.   hydrocortisone 25 MG suppository Commonly known as:  ANUSOL-HC Place 1 suppository (25 mg total) rectally 2 (two) times daily.   metFORMIN 500 MG 24 hr tablet Commonly known as:  GLUCOPHAGE-XR Take 500 mg by mouth 2 (two) times daily.   omeprazole 20 MG capsule Commonly known as:  PRILOSEC Take 1 capsule (20 mg total) by mouth daily.   polyethylene glycol packet Commonly known as:  MIRALAX / GLYCOLAX Take 17 g by mouth 2 (two) times daily.   potassium chloride SA 20 MEQ tablet Commonly known as:  KLOR-CON M20 Take 1 tablet (20 mEq total) by mouth 2 (two) times daily.   rivaroxaban 20 MG Tabs tablet Commonly known as:  XARELTO Take 1 tablet (20 mg total) by mouth daily with supper.   spironolactone 25 MG tablet Commonly known as:  ALDACTONE Take 1 tablet (25 mg total) by mouth daily.       No Known Allergies   Procedures/Studies: Colonscopy: 2/12  Nm Myocar Multi W/spect W/wall Motion / Ef  Result Date: 08/23/2016 CLINICAL DATA:   71 year old male with chest pain history of atrial fibrillation. Stress pharmacological EKG demonstrate no ST changes. EXAM: MYOCARDIAL IMAGING WITH SPECT (REST AND PHARMACOLOGIC-STRESS) GATED LEFT VENTRICULAR WALL MOTION STUDY LEFT VENTRICULAR EJECTION FRACTION TECHNIQUE: Standard myocardial SPECT imaging was performed after resting intravenous injection of 10 mCi Tc-68m tetrofosmin. Subsequently, intravenous infusion of Lexiscan was performed under the supervision of the Cardiology staff. At peak effect of the drug, 30 mCi Tc-22m tetrofosmin was injected intravenously and standard myocardial SPECT imaging was performed. Quantitative gated imaging was also performed to evaluate left ventricular wall motion, and estimate left ventricular ejection fraction. COMPARISON:  None. FINDINGS: Perfusion: Small region of mild decrease counts in the apical segment of the lateral wall which  improves from stress rest. No evidence of infarction. Wall Motion: Normal left ventricular wall motion. No left ventricular dilation. Left Ventricular Ejection Fraction: 60 % End diastolic volume 192 ml End systolic volume 77 ml IMPRESSION: 1. Small region of potential ischemia in the apical segment of the lateral wall. 2. Normal left ventricular wall motion. 3. Left ventricular ejection fraction 60% 4. Non invasive risk stratification*: Low *2012 Appropriate Use Criteria for Coronary Revascularization Focused Update: J Am Coll Cardiol. 2012;59(9):857-881. http://[email or web address]?[email or web address] Electronically Signed   By: Genevive Bi M.D.   On: 08/23/2016 16:06       Discharge Exam: Vitals:   08/25/16 1053 08/25/16 1313  BP: 110/63 (!) 139/45  Pulse: 74 70  Resp: 16 18  Temp: 97.8 F (36.6 C) 97.8 F (36.6 C)   Vitals:   08/25/16 0920 08/25/16 0925 08/25/16 1053 08/25/16 1313  BP:  (!) 132/56 110/63 (!) 139/45  Pulse:  67 74 70  Resp:  20 16 18   Temp:   97.8 F (36.6 C) 97.8 F (36.6 C)   TempSrc:   Oral Oral  SpO2: 98% 98% 98% 100%  Weight:      Height:        General: Pt is alert, awake, not in acute distress Cardiovascular: RRR, S1/S2 +, no rubs, no gallops Respiratory: CTA bilaterally, no wheezing, no rhonchi Abdominal: Soft, NT, ND, bowel sounds + Extremities: no edema, no cyanosis    The results of significant diagnostics from this hospitalization (including imaging, microbiology, ancillary and laboratory) are listed below for reference.     Microbiology: Recent Results (from the past 240 hour(s))  Urine culture     Status: Abnormal   Collection Time: 08/23/16  8:47 AM  Result Value Ref Range Status   Specimen Description URINE, CLEAN CATCH  Final   Special Requests NONE  Final   Culture MULTIPLE SPECIES PRESENT, SUGGEST RECOLLECTION (A)  Final   Report Status 08/24/2016 FINAL  Final     Labs: BNP (last 3 results) No results for input(s): BNP in the last 8760 hours. Basic Metabolic Panel:  Recent Labs Lab 08/20/16 2230 08/22/16 0541  NA 137 138  K 3.7 3.9  CL 102 105  CO2 26 25  GLUCOSE 160* 153*  BUN 22* 18  CREATININE 1.15 1.01  CALCIUM 9.1 9.0   Liver Function Tests:  Recent Labs Lab 08/20/16 2230  AST 18  ALT 14*  ALKPHOS 58  BILITOT 0.7  PROT 7.8  ALBUMIN 3.8   No results for input(s): LIPASE, AMYLASE in the last 168 hours. No results for input(s): AMMONIA in the last 168 hours. CBC:  Recent Labs Lab 08/21/16 0928 08/22/16 0541 08/23/16 0525 08/23/16 1240 08/24/16 0502 08/24/16 1654 08/25/16 0451  WBC 18.2* 15.0* 13.5* 14.0* 14.5* 19.3* 15.4*  NEUTROABS 12.7* 9.7*  --   --   --   --   --   HGB 8.5* 8.6* 8.3* 8.6* 8.4* 8.6* 8.0*  HCT 29.0* 29.0* 28.2* 29.9* 28.8* 29.7* 27.1*  MCV 82.6 81.9 81.7 80.6 82.1 81.8 81.9  PLT 129* 102* 122* 100* 121* 133* 94*   Cardiac Enzymes:  Recent Labs Lab 08/22/16 2208 08/23/16 0525 08/23/16 1240  TROPONINI <0.03 <0.03 <0.03   BNP: Invalid input(s): POCBNP CBG: No  results for input(s): GLUCAP in the last 168 hours. D-Dimer No results for input(s): DDIMER in the last 72 hours. Hgb A1c No results for input(s): HGBA1C in the last 72 hours. Lipid Profile No results for input(s): CHOL,  HDL, LDLCALC, TRIG, CHOLHDL, LDLDIRECT in the last 72 hours. Thyroid function studies No results for input(s): TSH, T4TOTAL, T3FREE, THYROIDAB in the last 72 hours.  Invalid input(s): FREET3 Anemia work up No results for input(s): VITAMINB12, FOLATE, FERRITIN, TIBC, IRON, RETICCTPCT in the last 72 hours. Urinalysis    Component Value Date/Time   COLORURINE YELLOW 08/23/2016 0847   APPEARANCEUR CLEAR 08/23/2016 0847   LABSPEC 1.015 08/23/2016 0847   PHURINE 8.0 08/23/2016 0847   GLUCOSEU NEGATIVE 08/23/2016 0847   HGBUR NEGATIVE 08/23/2016 0847   BILIRUBINUR NEGATIVE 08/23/2016 0847   KETONESUR NEGATIVE 08/23/2016 0847   PROTEINUR NEGATIVE 08/23/2016 0847   NITRITE NEGATIVE 08/23/2016 0847   LEUKOCYTESUR TRACE (A) 08/23/2016 0847   Sepsis Labs Invalid input(s): PROCALCITONIN,  WBC,  LACTICIDVEN Microbiology Recent Results (from the past 240 hour(s))  Urine culture     Status: Abnormal   Collection Time: 08/23/16  8:47 AM  Result Value Ref Range Status   Specimen Description URINE, CLEAN CATCH  Final   Special Requests NONE  Final   Culture MULTIPLE SPECIES PRESENT, SUGGEST RECOLLECTION (A)  Final   Report Status 08/24/2016 FINAL  Final     Time coordinating discharge: Over 30 minutes  SIGNED:   Calvert Cantor, MD  [facility] 08/25/2016, 2:25 PM Pager   If 7PM-7AM, please contact night-coverage [email or web address] Password [email or web address]

## 2016-08-25 NOTE — Telephone Encounter (Signed)
Called patient left message on personal voice mail to call me to schedule appointment with Dr.[name] to discuss test done recently done in hospital.

## 2016-08-25 NOTE — Interval H&P Note (Signed)
History and Physical Interval Note:  08/25/2016 8:08 AM  [name]  has presented today for surgery, with the diagnosis of rectal bleeding, anemia  The various methods of treatment have been discussed with the patient and family. After consideration of risks, benefits and other options for treatment, the patient has consented to  Procedure(s): COLONOSCOPY WITH PROPOFOL (N/A) as a surgical intervention .  The patient's history has been reviewed, patient examined, no change in status, stable for surgery.  I have reviewed the patient's chart and labs.  Questions were answered to the patient's satisfaction.     [name],Divante Kotch L

## 2016-08-25 NOTE — Op Note (Signed)
[facility] Patient Name: [name] Procedure Date: 08/25/2016 MRN: [id number] Attending MD: Nancy Fetter Dr., MD Date of Birth: 25-Dec-1945 CSN: [id number] Age: 71 Admit Type: Inpatient Procedure:                Colonoscopy Indications:              Hematochezia, patient has atrial fibrillation and                            has been on Xarelto and pass large quantity of                            bright red blood. He does frequently have some                            bright blood with wiping. Had a colonoscopy about                            10 years ago that he thinks was normal. Providers:                Jeneen Rinks L. Keena Heesch Dr., MD, Cleda Daub, RN,                            William Dalton, Technician Referring MD:              Medicines:                Monitored Anesthesia Care Complications:            No immediate complications. Estimated Blood Loss:     Estimated blood loss: none. Procedure:                Pre-Anesthesia Assessment:                           - Prior to the procedure, a History and Physical                            was performed, and patient medications and                            allergies were reviewed. The patient's tolerance of                            previous anesthesia was also reviewed. The risks                            and benefits of the procedure and the sedation                            options and risks were discussed with the patient.                            All questions were answered, and informed consent  was obtained. Prior Anticoagulants: The patient has                            taken Xarelto (rivaroxaban), last dose was 4 days                            prior to procedure. ASA Grade Assessment: III - A                            patient with severe systemic disease. After                            reviewing the risks and benefits, the patient was   deemed in satisfactory condition to undergo the                            procedure.                           After obtaining informed consent, the colonoscope                            was passed under direct vision. Throughout the                            procedure, the patient's blood pressure, pulse, and                            oxygen saturations were monitored continuously. The                            EC-[id number] ([id number]) scope was introduced through                            the anus and advanced to the the cecum, identified                            by appendiceal orifice and ileocecal valve. The                            colonoscopy was performed without difficulty. The                            patient tolerated the procedure well. The quality                            of the bowel preparation was good. The ileocecal                            valve, appendiceal orifice, and rectum were                            photographed. Scope In: 8:19:10 AM Scope Out: 0:86:76 AM Scope Withdrawal Time: 0 hours 8 minutes 38 seconds  Total Procedure Duration: 0 hours 17 minutes 39 seconds  Findings:      The perianal and digital rectal examinations were normal.      A few small-mouthed diverticula were found in the sigmoid colon.      There is no endoscopic evidence of bleeding, mass, polyps or tumor in       the entire colon.      Non-bleeding internal hemorrhoids were found during retroflexion. The       hemorrhoids were large and Grade I (internal hemorrhoids that do not       prolapse). Impression:               - Diverticulosis in the sigmoid colon.                           - Non-bleeding internal hemorrhoids.                           - No specimens collected.                           - Blood found in stool. This was probably due to                            bleeding hemorrhoids. Moderate Sedation:      MAC by anesthesia Recommendation:           - Return patient  to hospital ward for ongoing care.                           - Repeat colonoscopy in 10 years for screening                            purposes.                           - In 5 years begin to test stool for occult blood                            by your primary care MD                           - Miralax 1 capful (17 grams) in 8 ounces of water                            PO BID. to be adjusted by the patient in order to                            obtain soft bowel movements.                           - Resume previous diet.                           - Continue present medications. Procedure Code(s):        --- Professional ---  45378, Colonoscopy, flexible; diagnostic, including                            collection of specimen(s) by brushing or washing,                            when performed (separate procedure) Diagnosis Code(s):        --- Professional ---                           K92.1, Melena (includes Hematochezia)                           K57.30, Diverticulosis of large intestine without                            perforation or abscess without bleeding                           K64.0, First degree hemorrhoids CPT copyright 2016 [employer]. All rights reserved. The codes documented in this report are preliminary and upon coder review may  be revised to meet current compliance requirements. Nancy Fetter Dr., MD 08/25/2016 8:53:49 AM This report has been signed electronically. Number of Addenda: 0

## 2016-08-25 NOTE — Anesthesia Postprocedure Evaluation (Signed)
Anesthesia Post Note  Patient: [name]  Procedure(s) Performed: Procedure(s) (LRB): COLONOSCOPY WITH PROPOFOL (N/A)  Patient location during evaluation: PACU Anesthesia Type: MAC Level of consciousness: awake and alert Pain management: pain level controlled Vital Signs Assessment: post-procedure vital signs reviewed and stable Respiratory status: spontaneous breathing, nonlabored ventilation, respiratory function stable and patient connected to nasal cannula oxygen Cardiovascular status: stable and blood pressure returned to baseline Anesthetic complications: no       Last Vitals:  Vitals:   08/25/16 1053 08/25/16 1313  BP: 110/63 (!) 139/45  Pulse: 74 70  Resp: 16 18  Temp: 36.6 C 36.6 C    Last Pain:  Vitals:   08/25/16 1313  TempSrc: Oral  PainSc: 4                  Jaymz Traywick S

## 2016-08-25 NOTE — Anesthesia Preprocedure Evaluation (Signed)
Anesthesia Evaluation  Patient identified by MRN, date of birth, ID band Patient awake    Reviewed: Allergy & Precautions, NPO status , Patient's Chart, lab work & pertinent test results  Airway Mallampati: II  TM Distance: >3 FB Neck ROM: Full    Dental no notable dental hx.    Pulmonary sleep apnea , COPD, former smoker,    Pulmonary exam normal breath sounds clear to auscultation       Cardiovascular hypertension, Normal cardiovascular exam+ dysrhythmias Atrial Fibrillation  Rhythm:Regular Rate:Normal     Neuro/Psych negative neurological ROS  negative psych ROS   GI/Hepatic negative GI ROS, Neg liver ROS,   Endo/Other  diabetes  Renal/GU negative Renal ROS  negative genitourinary   Musculoskeletal  (+) Arthritis , Rheumatoid disorders,    Abdominal   Peds negative pediatric ROS (+)  Hematology negative hematology ROS (+)   Anesthesia Other Findings   Reproductive/Obstetrics negative OB ROS                             Anesthesia Physical Anesthesia Plan  ASA: III  Anesthesia Plan: MAC   Post-op Pain Management:    Induction: Intravenous  Airway Management Planned: Simple Face Mask  Additional Equipment:   Intra-op Plan:   Post-operative Plan:   Informed Consent: I have reviewed the patients History and Physical, chart, labs and discussed the procedure including the risks, benefits and alternatives for the proposed anesthesia with the patient or authorized representative who has indicated his/her understanding and acceptance.   Dental advisory given  Plan Discussed with: CRNA and Surgeon  Anesthesia Plan Comments:         Anesthesia Quick Evaluation

## 2016-08-25 NOTE — Progress Notes (Addendum)
The patient has been seen in conjunction with Theodore Demark, PAC. All aspects of care have been considered and discussed. The patient has been personally interviewed, examined, and all clinical data has been reviewed.   Agree with plan as outlined.   Resume anticoagulation therapy.  Follow-up withDr. Sw[place] as described  No further ischemic workup needed with a low risk myocardial perfusion study performed on 08/23/16.  Patient concerned that it will take > 30 days to see Dr. [place]. We will arrange earlier f/u.    Progress Note  Patient Name: JAYDEEN ODOR Date of Encounter: 08/25/2016  Primary Cardiologist: Dr Sw[place]and  Subjective   No chest pain or SOB overnight. Compliant w/ compression stockings. Feels skipped beats, not like afib sx.  Inpatient Medications    Scheduled Meds: . amiodarone  200 mg Oral Daily  . carvedilol  6.25 mg Oral BID WC  . diltiazem  180 mg Oral Daily  . furosemide  40 mg Oral Daily  . pantoprazole  40 mg Oral Daily  . polyethylene glycol  17 g Oral BID  . potassium chloride SA  20 mEq Oral BID  . sodium chloride flush  3 mL Intravenous Q12H  . umeclidinium-vilanterol  1 puff Inhalation Daily   Continuous Infusions:  PRN Meds: albuterol, HYDROcodone-acetaminophen   Vital Signs    Vitals:   08/25/16 0900 08/25/16 0910 08/25/16 0920 08/25/16 0925  BP: (!) 142/57 (!) 144/63  (!) 132/56  Pulse: 65 66  67  Resp: 14 17  20   Temp:      TempSrc:      SpO2: 98% 97% 98% 98%  Weight:      Height:        Intake/Output Summary (Last 24 hours) at 08/25/16 0946 Last data filed at 08/25/16 10/23/16  Gross per 24 hour  Intake             1840 ml  Output                0 ml  Net             1840 ml   Filed Weights   08/24/16 0443 08/25/16 0513 08/25/16 0753  Weight: (!) 342 lb 14.4 oz (155.5 kg) (!) 345 lb 1.6 oz (156.5 kg) (!) 345 lb (156.5 kg)    Telemetry    SR, non-conducted PACs - Personally Reviewed  ECG    n/a - Personally  Reviewed  Physical Exam   General: Well developed, well nourished, male appearing in no acute distress. Head: Normocephalic, atraumatic.  Neck: Supple without bruits, JVD not elevated, difficult to assess 2nd body habitus. Lungs:  Resp regular and unlabored, CTA. Heart: RRR, S1, S2, no S3, S4, or murmur; no rub. Abdomen: Soft, non-tender, non-distended with normoactive bowel sounds. No hepatomegaly. No rebound/guarding. No obvious abdominal masses. Extremities: No clubbing, cyanosis, trace-1+ edema. Distal pedal pulses are 2+ bilaterally. Neuro: Alert and oriented X 3. Moves all extremities spontaneously. Psych: Normal affect.  Labs    Chemistry Recent Labs Lab 08/20/16 2230 08/22/16 0541  NA 137 138  K 3.7 3.9  CL 102 105  CO2 26 25  GLUCOSE 160* 153*  BUN 22* 18  CREATININE 1.15 1.01  CALCIUM 9.1 9.0  PROT 7.8  --   ALBUMIN 3.8  --   AST 18  --   ALT 14*  --   ALKPHOS 58  --   BILITOT 0.7  --   GFRNONAA >60 >60  GFRAA >  60 >60  ANIONGAP 9 8     Hematology Recent Labs Lab 08/24/16 0502 08/24/16 1654 08/25/16 0451  WBC 14.5* 19.3* 15.4*  RBC 3.51* 3.63* 3.31*  HGB 8.4* 8.6* 8.0*  HCT 28.8* 29.7* 27.1*  MCV 82.1 81.8 81.9  MCH 23.9* 23.7* 24.2*  MCHC 29.2* 29.0* 29.5*  RDW 18.2* 18.2* 18.4*  PLT 121* 133* 94*    Cardiac Enzymes Recent Labs Lab 08/22/16 2208 08/23/16 0525 08/23/16 1240  TROPONINI <0.03 <0.03 <0.03    Radiology    Nm Myocar Multi W/spect W/wall Motion / Ef Result Date: 08/23/2016 CLINICAL DATA:  71 year old male with chest pain history of atrial fibrillation. Stress pharmacological EKG demonstrate no ST changes. EXAM: MYOCARDIAL IMAGING WITH SPECT (REST AND PHARMACOLOGIC-STRESS) GATED LEFT VENTRICULAR WALL MOTION STUDY LEFT VENTRICULAR EJECTION FRACTION TECHNIQUE: Standard myocardial SPECT imaging was performed after resting intravenous injection of 10 mCi Tc-40m tetrofosmin. Subsequently, intravenous infusion of Lexiscan was performed  under the supervision of the Cardiology staff. At peak effect of the drug, 30 mCi Tc-75m tetrofosmin was injected intravenously and standard myocardial SPECT imaging was performed. Quantitative gated imaging was also performed to evaluate left ventricular wall motion, and estimate left ventricular ejection fraction. COMPARISON:  None. FINDINGS: Perfusion: Small region of mild decrease counts in the apical segment of the lateral wall which improves from stress rest. No evidence of infarction. Wall Motion: Normal left ventricular wall motion. No left ventricular dilation. Left Ventricular Ejection Fraction: 60 % End diastolic volume 192 ml End systolic volume 77 ml IMPRESSION: 1. Small region of potential ischemia in the apical segment of the lateral wall. 2. Normal left ventricular wall motion. 3. Left ventricular ejection fraction 60% 4. Non invasive risk stratification*: Low *2012 Appropriate Use Criteria for Coronary Revascularization Focused Update: J Am Coll Cardiol. 2012;59(9):857-881. http://[email or web address]?articleid=[email or web address] Electronically Signed   By: Genevive Bi M.D.   On: 08/23/2016 16:06    Cardiac Studies   Echocardiogram 08/23/2016: Study Conclusions - Left ventricle: The cavity size was normal. Wall thickness was increased in a pattern of moderate LVH. Systolic function was normal. The estimated ejection fraction was in the range of 60% to 65%. Wall motion was normal; there were no regional wall motion abnormalities. Features are consistent with a pseudonormal left ventricular filling pattern, with concomitant abnormal relaxation and increased filling pressure (grade 2 diastolic dysfunction).  Patient Profile     71 y.o. male with history of paroxysmal atrial fibrillation managed with amiodarone, recently changed from Coumadin to Xarelto, now presenting with acute GI bleed. Endoscopy being considered although cardiac ischemic workup requested due  to exertional angina symptoms.   Assessment & Plan    1. GI bleed, hemoglobin 8.0 this morning. Patient also reporting hematuria. Xarelto has been held, hemorrhoids seen on colonoscopy. Repeat in 10 years. Since no further eval needed, restart Xarelto per IM.  2. Exertional angina. Patient has family history of CAD, also morbid obesity and hypertension with remote tobacco use history. He underwent Myoview study 08/23/2016 which was low risk overall demonstrating a small region of apical lateral ischemia and normal LVEF by echocardiography. - med rx w/ BB now, no ASA 2nd hemorrhoidal bleeding and Xarelto, LDL was 99, HDL 29 in 09/2015, Dr Swaziland to discuss statin with pt - hx chronic atypical chest pain, previous MV negative, remote cath ok - follow up with Dr Sw[place]and  3. Paroxysmal atrial fibrillation, currently in sinus rhythm on amiodarone, atenolol, and Cardizem CD. CHADSVASC score is 2. Xarelto currently held. Since no  further GI eval needed, ?restart.  4. Essential hypertension, blood pressure adequately controlled.  5. Chart reviewed regarding concern about patient having complete heart block on telemetry. Strips and recent ECG reviewd. He has not had complete heart block. He has had nonconducted PACs and also a single episode of very brief type I to type II heart block that was asymptomatic. Would continue telemetry monitoring.   Melida Quitter , PA-C 9:46 AM 08/25/2016 Pager: [phone]

## 2016-08-25 NOTE — Transfer of Care (Signed)
Immediate Anesthesia Transfer of Care Note  Patient: [name]  Procedure(s) Performed: Procedure(s): COLONOSCOPY WITH PROPOFOL (N/A)  Patient Location: PACU  Anesthesia Type:MAC  Level of Consciousness: Patient easily awoken, sedated, comfortable, cooperative, following commands, responds to stimulation.   Airway & Oxygen Therapy: Patient spontaneously breathing, ventilating well, oxygen via simple oxygen mask.  Post-op Assessment: Report given to PACU RN, vital signs reviewed and stable, moving all extremities.   Post vital signs: Reviewed and stable.  Complications: No apparent anesthesia complications  Last Vitals:  Vitals:   08/25/16 0513 08/25/16 0753  BP: (!) 118/46 (!) 146/44  Pulse: 63 67  Resp: 18 10  Temp: 37.1 C 36.8 C    Last Pain:  Vitals:   08/25/16 0753  TempSrc: Oral  PainSc:       Patients Stated Pain Goal: 2 (31/12/16 2446)  Complications: No apparent anesthesia complications

## 2016-08-25 NOTE — H&P (View-Only) (Signed)
No complaints. Stress test done and cardiology has cleared him for colonoscopy based on the last note reviewed. Colonoscopy to be planned tomorrow provided we can get MAC sedation to evaluate for rectal bleeding and anemia.

## 2016-08-29 ENCOUNTER — Encounter (HOSPITAL_COMMUNITY): Payer: Self-pay | Admitting: Gastroenterology

## 2016-09-01 NOTE — Progress Notes (Signed)
History of Present Illness: [name] is seen today for follow up of atrial fibrillation. He has a history of recurrent paroxysmal atrial fibrillation. He has been on chronic amiodarone therapy since September of 2011. He states this has worked very well for him and he has had very infrequent episodes of arrhythmia. This now is occuring occasionally but lasts less than 1 minute.   He also has a history of chronic chest pain with extensive evaluation in the past including cardiac cath, CT, and stress tests. Last stress Myoview was in 2012. He has a history of chronic venous stasis disease and had prior vein stripping by [name].   He was admitted earlier in February with iron deficiency anemia and BRBPR. Hgb dropped to 8 and remained stable. He had recently been switched from coumadin to Xarelto. Colonoscopy showed internal hemorrhoids and tics but no active bleeding. He was given Fe transfusion. He complained of chest tightness and a Myoview study was done showing a small area of apical ischemia. EF 60%. This was felt to be low risk. Echo was unremarkable. Xarelto was resumed at [facility].   On follow up today he states he is doing better. Prior to admission he did have 10 days of significant chest pain with exertion. It is better now. No evidence of bleeding. Scheduled for another iron infusion.   Current Outpatient Prescriptions on File Prior to Visit  Medication Sig Dispense Refill  . albuterol (PROVENTIL HFA;VENTOLIN HFA) 108 (90 Base) MCG/ACT inhaler Inhale 2 puffs into the lungs every 4 (four) hours as needed for wheezing or shortness of breath.    Marland Kitchen amiodarone (PACERONE) 200 MG tablet Take 200 mg by mouth every evening.    . diltiazem (CARDIZEM CD) 180 MG 24 hr capsule Take 180 mg by mouth every evening.    . furosemide (LASIX) 40 MG tablet Take 1 tablet (40 mg total) by mouth daily. 90 tablet 3  . hydrochlorothiazide (HYDRODIURIL) 25 MG tablet Take 1 tablet (25 mg total) by mouth daily. 90 tablet 3  .  HYDROcodone-acetaminophen (NORCO/VICODIN) 5-325 MG per tablet Take 1 tablet by mouth every 6 (six) hours as needed for moderate pain.     . hydrocortisone (ANUSOL-HC) 25 MG suppository Place 1 suppository (25 mg total) rectally 2 (two) times daily. 12 suppository 0  . metFORMIN (GLUCOPHAGE-XR) 500 MG 24 hr tablet Take 500 mg by mouth 2 (two) times daily.     Marland Kitchen omeprazole (PRILOSEC) 20 MG capsule Take 1 capsule (20 mg total) by mouth daily. 90 capsule 3  . polyethylene glycol (MIRALAX / GLYCOLAX) packet Take 17 g by mouth 2 (two) times daily. 14 each 0  . potassium chloride SA (KLOR-CON M20) 20 MEQ tablet Take 1 tablet (20 mEq total) by mouth 2 (two) times daily. 180 tablet 3  . rivaroxaban (XARELTO) 20 MG TABS tablet Take 1 tablet (20 mg total) by mouth daily with supper. 30 tablet 6  . spironolactone (ALDACTONE) 25 MG tablet Take 1 tablet (25 mg total) by mouth daily. 90 tablet 3  . umeclidinium-vilanterol (ANORO ELLIPTA) 62.5-25 MCG/INH AEPB Inhale 1 puff into the lungs daily.     No current facility-administered medications on file prior to visit.     No Known Allergies  Past Medical History:  Diagnosis Date  . Chronic anticoagulation   . Chronic back pain   . Chronic chest pain   . Chronic venous insufficiency   . Emphysema   . HTN (hypertension)   . Obesity   .  PAF (paroxysmal atrial fibrillation) (HCC)   . Pneumothorax   . Rheumatoid arthritis(714.0)     Past Surgical History:  Procedure Laterality Date  . ablation    . CARDIAC CATHETERIZATION  1998   NORMAL  . COLONOSCOPY WITH PROPOFOL N/A 08/25/2016   Procedure: COLONOSCOPY WITH PROPOFOL;  Surgeon: Ricky Ching, MD;  Location: [facility];  Service: Endoscopy;  Laterality: N/A;  . HERNIA REPAIR    . PLEURAL SCARIFICATION    . VEIN LIGATION AND STRIPPING      History  Smoking Status  . Former Smoker  . Packs/day: 2.00  . Years: 30.00  . Types: Cigarettes  . Quit date: 03/14/1996  Smokeless Tobacco  . Never Used     History  Alcohol Use  . Yes    Comment: one beer per month    Family History  Problem Relation Age of Onset  . Other Father     CEREBRAL THROMBOSIS  . Cancer Mother     bone cancer  . Rheum arthritis Mother   . Heart failure Brother 50  . Coronary artery disease Brother 51    PTCA  . Rheum arthritis Brother   . Rheum arthritis Brother     Review of Systems: As noted in history of present illness. All other systems were reviewed and are negative.  Physical Exam: BP 140/68 (BP Location: Left Arm, Patient Position: Sitting, Cuff Size: Large)   Pulse 68   Ht 6\' 6"  (1.981 m)   Wt (!) 342 lb 9.6 oz (155.4 kg)   BMI 39.59 kg/m  He is a pleasant, obese white male. He is in no acute distress. HEENT is unremarkable.Neck is supple. No masses.  Lungs are clear. Cardiac exam shows a regular rate and rhythm. No gallop or murmur. Abdomen is obese. There are no masses or tenderness to palpation. Extremities are full. He has chronic venous stasis disease with chronic brawny edema. Gait and ROM are intact. He has no gross neurologic deficits.  Laboratory data:   Lab Results  Component Value Date   WBC 15.4 (H) 08/25/2016   HGB 8.0 (L) 08/25/2016   HCT 27.1 (L) 08/25/2016   PLT 94 (L) 08/25/2016   GLUCOSE 153 (H) 08/22/2016   CHOL 163 09/24/2015   TRIG 174 (H) 09/24/2015   HDL 29 (L) 09/24/2015   LDLCALC 99 09/24/2015   ALT 14 (L) 08/20/2016   AST 18 08/20/2016   NA 138 08/22/2016   K 3.9 08/22/2016   CL 105 08/22/2016   CREATININE 1.01 08/22/2016   BUN 18 08/22/2016   CO2 25 08/22/2016   TSH 3.51 09/24/2015   INR 1.80 08/20/2016   HGBA1C 7.1 (H) 09/21/2013    Reviewed from primary care dated 08/20/16: A1c 9.1% CMET normal.   Myoview 08/22/16: MYOCARDIAL IMAGING WITH SPECT (REST AND PHARMACOLOGIC-STRESS)  GATED LEFT VENTRICULAR WALL MOTION STUDY  LEFT VENTRICULAR EJECTION FRACTION  TECHNIQUE: Standard myocardial SPECT imaging was performed after  resting intravenous injection of 10 mCi Tc-65m tetrofosmin. Subsequently, intravenous infusion of Lexiscan was performed under the supervision of the Cardiology staff. At peak effect of the drug, 30 mCi Tc-82m tetrofosmin was injected intravenously and standard myocardial SPECT imaging was performed. Quantitative gated imaging was also performed to evaluate left ventricular wall motion, and estimate left ventricular ejection fraction.  COMPARISON:  None.  FINDINGS: Perfusion: Small region of mild decrease counts in the apical segment of the lateral wall which improves from stress rest. No evidence of infarction.  Wall Motion:  Normal left ventricular wall motion. No left ventricular dilation.  Left Ventricular Ejection Fraction: 60 %  End diastolic volume 192 ml  End systolic volume 77 ml  IMPRESSION: 1. Small region of potential ischemia in the apical segment of the lateral wall.  2. Normal left ventricular wall motion.  3. Left ventricular ejection fraction 60%  4. Non invasive risk stratification*: Low  *2012 Appropriate Use Criteria for Coronary Revascularization Focused Update: J Am Coll Cardiol. 2012;59(9):857-881. http://content.dementiazones.com.aspx?articleid=1201161   Electronically Signed   By: Genevive Bi M.D.   On: 08/23/2016 16:06  Echo 08/23/16: Study Conclusions  - Left ventricle: The cavity size was normal. Wall thickness was   increased in a pattern of moderate LVH. Systolic function was   normal. The estimated ejection fraction was in the range of 60%   to 65%. Wall motion was normal; there were no regional wall   motion abnormalities. Features are consistent with a pseudonormal   left ventricular filling pattern, with concomitant abnormal   relaxation and increased filling pressure (grade 2 diastolic   dysfunction).  Assessment / Plan: 1. Paroxysmal atrial fibrillation. This is well controlled on amiodarone. We will  continue amiodarone 100 mg daily and atenolol. On Xarelto for anticoagulation  2. Anticoagulation. Now on Xarelto.  3. Morbid obesity with sleep apnea. Continue efforts at weight loss.   4. Chronic venous insufficiency with severe chronic edema. Legs actually less swollen today.  Continue diuretic therapy with Lasix, HCTZ, and Aldactone. Continue potassium supplementation.   5. COPD/emphysema.   6. Chest pain. Recent symptoms consistent with typical angina. Long history of atypical chest pain in the past. Recent symptoms exacerbated by anemia.  Mmyoview showed small area of apical ischemia. EF normal. Given symptoms and myoview results I have recommended cardiac cath to define anatomy and risk. I would like to wait until improvement and stabilization of his anemia especially if he were to require PCI and need antiplatelet therapy. Will monitor response to iron replacement. I will follow up in one month. If Hgb improved will proceed with cardiac cath then. If his pain should worsen in the meantime he is to contact me and we may need to proceed sooner.  7. Acute blood loss iron deficiency anemia  8. Bright red blood per rectum. Secondary to internal hemorrhoids.   I will see in 4 weeks.

## 2016-09-03 ENCOUNTER — Other Ambulatory Visit (HOSPITAL_COMMUNITY): Payer: Self-pay | Admitting: *Deleted

## 2016-09-03 ENCOUNTER — Telehealth: Payer: Self-pay | Admitting: Hematology and Oncology

## 2016-09-03 NOTE — Telephone Encounter (Signed)
Received a referral from [facility]. Notified them to let them know the pt is est of Dr. Bertis Ruddy. Msg was sent to Dr. Bertis Ruddy to forward an appt to scheduling.

## 2016-09-04 ENCOUNTER — Ambulatory Visit (INDEPENDENT_AMBULATORY_CARE_PROVIDER_SITE_OTHER): Payer: Medicare Other | Admitting: Cardiology

## 2016-09-04 ENCOUNTER — Encounter: Payer: Self-pay | Admitting: Cardiology

## 2016-09-04 ENCOUNTER — Ambulatory Visit (HOSPITAL_COMMUNITY)
Admission: RE | Admit: 2016-09-04 | Discharge: 2016-09-04 | Disposition: A | Discharge status: Home / Self Care | Payer: Medicare Other | Source: Ambulatory Visit | Attending: Family Medicine | Admitting: Family Medicine

## 2016-09-04 VITALS — BP 140/68 | HR 68 | Ht 78.0 in | Wt 342.6 lb

## 2016-09-04 DIAGNOSIS — E119 Type 2 diabetes mellitus without complications: Secondary | ICD-10-CM | POA: Diagnosis not present

## 2016-09-04 DIAGNOSIS — I208 Other forms of angina pectoris: Secondary | ICD-10-CM | POA: Diagnosis not present

## 2016-09-04 DIAGNOSIS — I10 Essential (primary) hypertension: Secondary | ICD-10-CM | POA: Diagnosis not present

## 2016-09-04 DIAGNOSIS — R9439 Abnormal result of other cardiovascular function study: Secondary | ICD-10-CM | POA: Insufficient documentation

## 2016-09-04 DIAGNOSIS — K922 Gastrointestinal hemorrhage, unspecified: Secondary | ICD-10-CM | POA: Diagnosis present

## 2016-09-04 DIAGNOSIS — D509 Iron deficiency anemia, unspecified: Secondary | ICD-10-CM | POA: Diagnosis present

## 2016-09-04 MED ORDER — ATENOLOL 50 MG PO TABS: 50.0000 mg | ORAL_TABLET | Freq: Two times a day (BID) | ORAL | 3 refills | Status: DC

## 2016-09-04 MED ORDER — SODIUM CHLORIDE 0.9 % IV SOLN
510.0000 mg | Freq: Once | INTRAVENOUS | Status: AC
  Administered 2016-09-04: 510 mg via INTRAVENOUS
  Filled 2016-09-04: qty 17

## 2016-09-04 NOTE — Patient Instructions (Signed)
Continue your current therapy  I will see you in one month.    

## 2016-09-07 ENCOUNTER — Other Ambulatory Visit: Payer: Self-pay | Admitting: Cardiology

## 2016-09-08 ENCOUNTER — Ambulatory Visit: Payer: Medicare Other | Admitting: Hematology and Oncology

## 2016-09-08 ENCOUNTER — Other Ambulatory Visit: Payer: Self-pay | Admitting: Hematology and Oncology

## 2016-09-08 ENCOUNTER — Other Ambulatory Visit: Payer: Medicare Other

## 2016-09-08 ENCOUNTER — Telehealth: Payer: Self-pay | Admitting: Hematology and Oncology

## 2016-09-08 DIAGNOSIS — D62 Acute posthemorrhagic anemia: Secondary | ICD-10-CM

## 2016-09-08 NOTE — Telephone Encounter (Signed)
lvm to inform pt of r/s appt to 3/16 ap 3pm

## 2016-09-26 ENCOUNTER — Telehealth: Payer: Self-pay | Admitting: Hematology and Oncology

## 2016-09-26 ENCOUNTER — Other Ambulatory Visit (HOSPITAL_BASED_OUTPATIENT_CLINIC_OR_DEPARTMENT_OTHER): Payer: Medicare Other

## 2016-09-26 ENCOUNTER — Ambulatory Visit (HOSPITAL_BASED_OUTPATIENT_CLINIC_OR_DEPARTMENT_OTHER): Payer: Medicare Other | Admitting: Hematology and Oncology

## 2016-09-26 ENCOUNTER — Encounter: Payer: Self-pay | Admitting: Hematology and Oncology

## 2016-09-26 DIAGNOSIS — D72829 Elevated white blood cell count, unspecified: Secondary | ICD-10-CM

## 2016-09-26 DIAGNOSIS — D72825 Bandemia: Secondary | ICD-10-CM

## 2016-09-26 DIAGNOSIS — D62 Acute posthemorrhagic anemia: Secondary | ICD-10-CM | POA: Diagnosis not present

## 2016-09-26 DIAGNOSIS — Z7901 Long term (current) use of anticoagulants: Secondary | ICD-10-CM

## 2016-09-26 DIAGNOSIS — L97521 Non-pressure chronic ulcer of other part of left foot limited to breakdown of skin: Secondary | ICD-10-CM

## 2016-09-26 DIAGNOSIS — L97529 Non-pressure chronic ulcer of other part of left foot with unspecified severity: Secondary | ICD-10-CM | POA: Insufficient documentation

## 2016-09-26 LAB — CBC & DIFF AND RETIC
BASO%: 1.8 % (ref 0.0–2.0)
Basophils Absolute: 0.3 10*3/uL — ABNORMAL HIGH (ref 0.0–0.1)
EOS%: 2.9 % (ref 0.0–7.0)
Eosinophils Absolute: 0.5 10*3/uL (ref 0.0–0.5)
HEMATOCRIT: 35.8 % — AB (ref 38.4–49.9)
HGB: 10.7 g/dL — ABNORMAL LOW (ref 13.0–17.1)
Immature Retic Fract: 24.5 % — ABNORMAL HIGH (ref 3.00–10.60)
LYMPH#: 3.1 10*3/uL (ref 0.9–3.3)
LYMPH%: 19.4 % (ref 14.0–49.0)
MCH: 25.4 pg — AB (ref 27.2–33.4)
MCHC: 29.9 g/dL — AB (ref 32.0–36.0)
MCV: 84.8 fL (ref 79.3–98.0)
MONO#: 1.8 10*3/uL — ABNORMAL HIGH (ref 0.1–0.9)
MONO%: 11 % (ref 0.0–14.0)
NEUT%: 64.9 % (ref 39.0–75.0)
NEUTROS ABS: 10.4 10*3/uL — AB (ref 1.5–6.5)
PLATELETS: 140 10*3/uL (ref 140–400)
RBC: 4.22 10*6/uL (ref 4.20–5.82)
RDW: 20.9 % — ABNORMAL HIGH (ref 11.0–14.6)
Retic %: 3.23 % — ABNORMAL HIGH (ref 0.80–1.80)
Retic Ct Abs: 136.31 10*3/uL — ABNORMAL HIGH (ref 34.80–93.90)
WBC: 15.9 10*3/uL — AB (ref 4.0–10.3)
nRBC: 0 % (ref 0–0)

## 2016-09-26 NOTE — Assessment & Plan Note (Signed)
He has chronic leukocytosis of unknown etiology. Previously, he had been on corticosteroid therapy. He is no longer on steroid therapy but has persistent recurrent leukocytosis. The intensity of the leukocytosis fluctuated.  The pattern is predominantly neutrophilia. I suspect it is reactive in nature.  Obesity, skin infection, foot ulcer could trigger a reactive leukocytosis. He does not need further workup or follow-up in that regard

## 2016-09-26 NOTE — Telephone Encounter (Signed)
Appointments scheduled per 3.16.18 LOS. Patient given AVS report and calendars with future scheduled appointments.  °

## 2016-09-26 NOTE — Assessment & Plan Note (Signed)
The patient had hemorrhoids and recurrent GI bleed secondary to anticoagulation therapy. He had recently received intravenous iron replacement therapy with improvement of anemia. I suspect he would be at very high risk of recurrence of GI bleed in the future. I will see him back every 3 months and give him intravenous iron as needed.

## 2016-09-26 NOTE — Progress Notes (Signed)
[name] OFFICE PROGRESS NOTE  Mand, [name], [name]: This patient was last seen almost 3 years ago. He was initially referred to see me due to chronic leukocytosis which was thought to be related to prednisone therapy for rheumatoid arthritis. The patient is referred back to see me because of recent severe iron deficiency anemia. I reviewed his records extensively.  The patient is morbidly obese with diabetes and heart disease. He has paroxysmal atrial fibrillation and was initially treated with warfarin therapy and recently switched to Xarelto. In February 2018, he presented with signs and symptoms of GI bleed.  He received intravenous iron treatment.  He is referred here for further management INTERVAL HISTORY: [name] 71 y.o. male returns for further follow-up. He is no longer taking medication for rheumatoid arthritis. He denies recent chest pain or shortness of breath. He continues to see intermittent passage of dark stool and occasional hemorrhoidal bleeding. Recent colonoscopy show evidence of large hemorrhoids. He denies epistaxis or hematuria.  He also have a nonhealing foot ulcer on the left, currently being treated by his podiatrist. He denies recent infection, cough or sore throat.  I have reviewed the past medical history, past surgical history, social history and family history with the patient and they are unchanged from previous note.  ALLERGIES:  has No Known Allergies.  MEDICATIONS:  Current Outpatient Prescriptions  Medication Sig Dispense Refill  . albuterol (PROVENTIL HFA;VENTOLIN HFA) 108 (90 Base) MCG/ACT inhaler Inhale 2 puffs into the lungs every 4 (four) hours as needed for wheezing or shortness of breath.    Marland Kitchen amiodarone (PACERONE) 200 MG tablet TAKE 1 TABLET BY MOUTH  DAILY 90 tablet 1  . atenolol (TENORMIN) 50 MG tablet Take 1 tablet (50 mg total) by mouth 2 (two) times daily. 180 tablet 3  . diltiazem  (CARDIZEM CD) 180 MG 24 hr capsule TAKE 1 CAPSULE BY MOUTH  DAILY 90 capsule 1  . furosemide (LASIX) 40 MG tablet TAKE 1 TABLET BY MOUTH  DAILY 90 tablet 1  . hydrochlorothiazide (HYDRODIURIL) 25 MG tablet TAKE 1 TABLET BY MOUTH  DAILY 90 tablet 1  . HYDROcodone-acetaminophen (NORCO/VICODIN) 5-325 MG per tablet Take 1 tablet by mouth every 6 (six) hours as needed for moderate pain.     . hydrocortisone (ANUSOL-HC) 25 MG suppository Place 1 suppository (25 mg total) rectally 2 (two) times daily. 12 suppository 0  . metFORMIN (GLUCOPHAGE-XR) 500 MG 24 hr tablet Take 500 mg by mouth 2 (two) times daily.     Marland Kitchen omeprazole (PRILOSEC) 20 MG capsule TAKE 1 CAPSULE BY MOUTH  DAILY 90 capsule 1  . polyethylene glycol (MIRALAX / GLYCOLAX) packet Take 17 g by mouth 2 (two) times daily. 14 each 0  . potassium chloride SA (KLOR-CON M20) 20 MEQ tablet Take 1 tablet (20 mEq total) by mouth 2 (two) times daily. 180 tablet 3  . rivaroxaban (XARELTO) 20 MG TABS tablet Take 1 tablet (20 mg total) by mouth daily with supper. 30 tablet 6  . spironolactone (ALDACTONE) 25 MG tablet TAKE 1 TABLET BY MOUTH  DAILY 90 tablet 1  . umeclidinium-vilanterol (ANORO ELLIPTA) 62.5-25 MCG/INH AEPB Inhale 1 puff into the lungs daily.     No current facility-administered medications for this visit.      REVIEW OF SYSTEMS:   Constitutional: Denies fevers, chills or night sweats Eyes: Denies blurriness of vision Ears, nose, mouth, throat, and face: Denies mucositis or sore throat Respiratory: Denies  cough, dyspnea or wheezes Cardiovascular: Denies palpitation, chest discomfort or lower extremity swelling Gastrointestinal:  Denies nausea, heartburn or change in bowel habits Skin: Denies abnormal skin rashes Lymphatics: Denies new lymphadenopathy or easy bruising Neurological:Denies numbness, tingling or new weaknesses Behavioral/Psych: Mood is stable, no new changes  All other systems were reviewed with the patient and are  negative.  PHYSICAL EXAMINATION: ECOG PERFORMANCE STATUS: 2 - Symptomatic, <50% confined to bed  Vitals:   09/26/16 1014  BP: (!) 120/53  Pulse: 67  Resp: 20  Temp: 98.4 F (36.9 C)   Filed Weights   09/26/16 1014  Weight: (!) 346 lb 8 oz (157.2 kg)    GENERAL:alert, no distress and comfortable.  He is morbidly obese SKIN: skin color, texture, turgor are normal, no rashes or significant lesions EYES: normal, Conjunctiva are pink and non-injected, sclera clear OROPHARYNX:no exudate, no erythema and lips, buccal mucosa, and tongue normal  NECK: supple, thyroid normal size, non-tender, without nodularity LYMPH:  no palpable lymphadenopathy in the cervical, axillary or inguinal LUNGS: clear to auscultation and percussion with normal breathing effort HEART: regular rate & rhythm and no murmurs with moderate bilateral lower extremity edema ABDOMEN:abdomen soft, non-tender and normal bowel sounds Musculoskeletal:no cyanosis of digits and no clubbing  NEURO: alert & oriented x 3 with fluent speech, no focal motor/sensory deficits  LABORATORY DATA:  I have reviewed the data as listed     Component Value Date/Time   NA 138 08/22/2016 0541   K 3.9 08/22/2016 0541   CL 105 08/22/2016 0541   CO2 25 08/22/2016 0541   GLUCOSE 153 (H) 08/22/2016 0541   BUN 18 08/22/2016 0541   CREATININE 1.01 08/22/2016 0541   CREATININE 1.00 09/24/2015 0846   CALCIUM 9.0 08/22/2016 0541   PROT 7.8 08/20/2016 2230   ALBUMIN 3.8 08/20/2016 2230   AST 18 08/20/2016 2230   ALT 14 (L) 08/20/2016 2230   ALKPHOS 58 08/20/2016 2230   BILITOT 0.7 08/20/2016 2230   GFRNONAA >60 08/22/2016 0541   GFRAA >60 08/22/2016 0541    No results found for: SPEP, UPEP  Lab Results  Component Value Date   WBC 15.9 (H) 09/26/2016   NEUTROABS 10.4 (H) 09/26/2016   HGB 10.7 (L) 09/26/2016   HCT 35.8 (L) 09/26/2016   MCV 84.8 09/26/2016   PLT 140 09/26/2016      Chemistry      Component Value Date/Time    NA 138 08/22/2016 0541   K 3.9 08/22/2016 0541   CL 105 08/22/2016 0541   CO2 25 08/22/2016 0541   BUN 18 08/22/2016 0541   CREATININE 1.01 08/22/2016 0541   CREATININE 1.00 09/24/2015 0846      Component Value Date/Time   CALCIUM 9.0 08/22/2016 0541   ALKPHOS 58 08/20/2016 2230   AST 18 08/20/2016 2230   ALT 14 (L) 08/20/2016 2230   BILITOT 0.7 08/20/2016 2230     ASSESSMENT & PLAN:  Leukocytosis He has chronic leukocytosis of unknown etiology. Previously, he had been on corticosteroid therapy. He is no longer on steroid therapy but has persistent recurrent leukocytosis. The intensity of the leukocytosis fluctuated.  The pattern is predominantly neutrophilia. I suspect it is reactive in nature.  Obesity, skin infection, foot ulcer could trigger a reactive leukocytosis. He does not need further workup or follow-up in that regard  Chronic ulcer of left foot (HCC) He has a slow healing diabetic foot ulcer that is currently being managed by podiatrist. According to the patient,  it is improving.  Acute blood loss anemia The patient had hemorrhoids and recurrent GI bleed secondary to anticoagulation therapy. He had recently received intravenous iron replacement therapy with improvement of anemia. I suspect he would be at very high risk of recurrence of GI bleed in the future. I will see him back every 3 months and give him intravenous iron as needed.  Chronic anticoagulation He is taking chronic anticoagulation therapy due to paroxysmal atrial fibrillation. His cardiologist recommended this for long-term preventative strategy against stroke. We will monitor his CBC and iron studies carefully while he is on chronic anticoagulation therapy.   No orders of the defined types were placed in this encounter.   All questions were answered. The patient knows to call the clinic with any problems, questions or concerns. No barriers to learning was detected.  I spent 25 minutes  counseling the patient face to face. The total time spent in the appointment was 40 minutes and more than 50% was on counseling.     Artis Delay, MD 3/16/20182:01 PM

## 2016-09-26 NOTE — Assessment & Plan Note (Signed)
He is taking chronic anticoagulation therapy due to paroxysmal atrial fibrillation. His cardiologist recommended this for long-term preventative strategy against stroke. We will monitor his CBC and iron studies carefully while he is on chronic anticoagulation therapy.

## 2016-09-26 NOTE — Assessment & Plan Note (Signed)
He has a slow healing diabetic foot ulcer that is currently being managed by podiatrist. According to the patient, it is improving.

## 2016-09-29 ENCOUNTER — Other Ambulatory Visit: Payer: Self-pay | Admitting: Emergency Medicine

## 2016-10-01 NOTE — Progress Notes (Signed)
History of Present Illness: [name] is seen today for follow up of atrial fibrillation and chest pain. He has a history of recurrent paroxysmal atrial fibrillation. He has been on chronic amiodarone therapy since September of 2011. He states this has worked very well for him and he has had very infrequent episodes of arrhythmia. This now is occuring occasionally but lasts less than 1 minute.   He also has a history of chronic chest pain with extensive evaluation in the past including cardiac cath, CT, and stress tests. Last stress Myoview was in 2012. He has a history of chronic venous stasis disease and had prior vein stripping by Dr. Hart [name].   He was admitted earlier in February with iron deficiency anemia and BRBPR. Hgb dropped to 8 and remained stable. He had recently been switched from coumadin to Xarelto. Colonoscopy showed internal hemorrhoids and tics but no active bleeding. He was given Fe transfusion. He complained of chest tightness and a Myoview study was done showing a small area of apical ischemia. EF 60%. This was felt to be low risk. Echo was unremarkable. Xarelto was resumed at DC. On follow up we recommended consideration of cardiac cath given his severe chest pain and mildly abnormal Myoview study. We wanted to wait until his Hgb had stabilized in case PCI was needed. He is now seen for follow up. He is followed by Dr. Bertis Ruddy and recent Hgb was up to 10.7. He was told he may need intermittent iron infusions.  On follow up today he states he is doing Ok. He has had no recurrent chest pain. Notes DOE and fatigue.  No evidence of recurrent bleeding.   Current Outpatient Prescriptions on File Prior to Visit  Medication Sig Dispense Refill  . albuterol (PROVENTIL HFA;VENTOLIN HFA) 108 (90 Base) MCG/ACT inhaler Inhale 2 puffs into the lungs every 4 (four) hours as needed for wheezing or shortness of breath.    Marland Kitchen amiodarone (PACERONE) 200 MG tablet TAKE 1 TABLET BY MOUTH  DAILY 90 tablet 1  .  ANORO ELLIPTA 62.5-25 MCG/INH AEPB INHALE 1 PUFF INTO THE LUNGS DAILY. 60 each 4  . atenolol (TENORMIN) 50 MG tablet Take 1 tablet (50 mg total) by mouth 2 (two) times daily. 180 tablet 3  . diltiazem (CARDIZEM CD) 180 MG 24 hr capsule TAKE 1 CAPSULE BY MOUTH  DAILY 90 capsule 1  . furosemide (LASIX) 40 MG tablet TAKE 1 TABLET BY MOUTH  DAILY 90 tablet 1  . hydrochlorothiazide (HYDRODIURIL) 25 MG tablet TAKE 1 TABLET BY MOUTH  DAILY 90 tablet 1  . HYDROcodone-acetaminophen (NORCO/VICODIN) 5-325 MG per tablet Take 1 tablet by mouth every 6 (six) hours as needed for moderate pain.     . hydrocortisone (ANUSOL-HC) 25 MG suppository Place 1 suppository (25 mg total) rectally 2 (two) times daily. 12 suppository 0  . metFORMIN (GLUCOPHAGE-XR) 500 MG 24 hr tablet Take 500 mg by mouth 2 (two) times daily.     Marland Kitchen omeprazole (PRILOSEC) 20 MG capsule TAKE 1 CAPSULE BY MOUTH  DAILY 90 capsule 1  . polyethylene glycol (MIRALAX / GLYCOLAX) packet Take 17 g by mouth 2 (two) times daily. 14 each 0  . potassium chloride SA (KLOR-CON M20) 20 MEQ tablet Take 1 tablet (20 mEq total) by mouth 2 (two) times daily. 180 tablet 3  . rivaroxaban (XARELTO) 20 MG TABS tablet Take 1 tablet (20 mg total) by mouth daily with supper. 30 tablet 6  . spironolactone (ALDACTONE) 25 MG tablet TAKE 1  TABLET BY MOUTH  DAILY 90 tablet 1   No current facility-administered medications on file prior to visit.     No Known Allergies  Past Medical History:  Diagnosis Date  . Chronic anticoagulation   . Chronic back pain   . Chronic chest pain   . Chronic venous insufficiency   . Emphysema   . HTN (hypertension)   . Obesity   . PAF (paroxysmal atrial fibrillation) (HCC)   . Pneumothorax   . Rheumatoid arthritis(714.0)     Past Surgical History:  Procedure Laterality Date  . ablation    . CARDIAC CATHETERIZATION  1998   NORMAL  . COLONOSCOPY WITH PROPOFOL N/A 08/25/2016   Procedure: COLONOSCOPY WITH PROPOFOL;  Surgeon: Carman Ching, MD;  Location: [facility];  Service: Endoscopy;  Laterality: N/A;  . HERNIA REPAIR    . PLEURAL SCARIFICATION    . VEIN LIGATION AND STRIPPING      History  Smoking Status  . Former Smoker  . Packs/day: 2.00  . Years: 30.00  . Types: Cigarettes  . Quit date: 03/14/1996  Smokeless Tobacco  . Never Used    History  Alcohol Use  . Yes    Comment: one beer per month    Family History  Problem Relation Age of Onset  . Other Father     CEREBRAL THROMBOSIS  . Cancer Mother     bone cancer  . Rheum arthritis Mother   . Heart failure Brother 50  . Coronary artery disease Brother 68    PTCA  . Rheum arthritis Brother   . Rheum arthritis Brother     Review of Systems: As noted in history of present illness. All other systems were reviewed and are negative.  Physical Exam: BP 140/60   Pulse 64   Ht 6' 6.5" (1.994 m)   Wt (!) 354 lb (160.6 kg)   BMI 40.39 kg/m  He is a pleasant, obese white male. He is in no acute distress. HEENT is unremarkable.Neck is supple. No masses.  Lungs are clear. Cardiac exam shows a regular rate and rhythm. No gallop or murmur. Abdomen is obese. There are no masses or tenderness to palpation. Extremities are full. He has chronic venous stasis disease with chronic brawny edema. Gait and ROM are intact. He has no gross neurologic deficits.  Laboratory data:   Lab Results  Component Value Date   WBC 15.9 (H) 09/26/2016   HGB 10.7 (L) 09/26/2016   HCT 35.8 (L) 09/26/2016   PLT 140 09/26/2016   GLUCOSE 153 (H) 08/22/2016   CHOL 163 09/24/2015   TRIG 174 (H) 09/24/2015   HDL 29 (L) 09/24/2015   LDLCALC 99 09/24/2015   ALT 14 (L) 08/20/2016   AST 18 08/20/2016   NA 138 08/22/2016   K 3.9 08/22/2016   CL 105 08/22/2016   CREATININE 1.01 08/22/2016   BUN 18 08/22/2016   CO2 25 08/22/2016   TSH 3.51 09/24/2015   INR 1.80 08/20/2016   HGBA1C 7.1 (H) 09/21/2013    Reviewed from primary care dated 08/20/16: A1c 9.1% CMET normal.    Myoview 08/22/16: MYOCARDIAL IMAGING WITH SPECT (REST AND PHARMACOLOGIC-STRESS)  GATED LEFT VENTRICULAR WALL MOTION STUDY  LEFT VENTRICULAR EJECTION FRACTION  TECHNIQUE: Standard myocardial SPECT imaging was performed after resting intravenous injection of 10 mCi Tc-47m tetrofosmin. Subsequently, intravenous infusion of Lexiscan was performed under the supervision of the Cardiology staff. At peak effect of the drug, 30 mCi Tc-52m tetrofosmin was injected intravenously  and standard myocardial SPECT imaging was performed. Quantitative gated imaging was also performed to evaluate left ventricular wall motion, and estimate left ventricular ejection fraction.  COMPARISON:  None.  FINDINGS: Perfusion: Small region of mild decrease counts in the apical segment of the lateral wall which improves from stress rest. No evidence of infarction.  Wall Motion: Normal left ventricular wall motion. No left ventricular dilation.  Left Ventricular Ejection Fraction: 60 %  End diastolic volume 192 ml  End systolic volume 77 ml  IMPRESSION: 1. Small region of potential ischemia in the apical segment of the lateral wall.  2. Normal left ventricular wall motion.  3. Left ventricular ejection fraction 60%  4. Non invasive risk stratification*: Low  *2012 Appropriate Use Criteria for Coronary Revascularization Focused Update: J Am Coll Cardiol. 2012;59(9):857-881. http://content.dementiazones.com.aspx?articleid=1201161   Electronically Signed   By: Genevive Bi M.D.   On: 08/23/2016 16:06  Echo 08/23/16: Study Conclusions  - Left ventricle: The cavity size was normal. Wall thickness was   increased in a pattern of moderate LVH. Systolic function was   normal. The estimated ejection fraction was in the range of 60%   to 65%. Wall motion was normal; there were no regional wall   motion abnormalities. Features are consistent with a pseudonormal   left  ventricular filling pattern, with concomitant abnormal   relaxation and increased filling pressure (grade 2 diastolic   dysfunction).  Assessment / Plan: 1. Paroxysmal atrial fibrillation. This is well controlled on amiodarone. We will continue amiodarone 100 mg daily and atenolol. On Xarelto for anticoagulation  2. Anticoagulation. Now on Xarelto.  3. Morbid obesity with sleep apnea. Continue efforts at weight loss.   4. Chronic venous insufficiency with severe chronic edema. Legs actually less swollen today.  Continue diuretic therapy with Lasix, HCTZ, and Aldactone. Continue potassium supplementation.   5. COPD/emphysema.   6. Chest pain. Symptoms in February consistent with typical angina. Long history of atypical chest pain in the past. Symptoms exacerbated by anemia.  Myoview showed small area of apical ischemia. EF normal. Given symptoms and myoview results I have recommended cardiac cath to define anatomy and risk. His Hgb has been stable this past month with no evidence of bleeding. I think it is safe to proceed with cardiac cath now with possible PCI if needed. Procedure scheduled for April 6. Will need to hold Xarelto 2 days prior. Hold metformin 2 days after. The procedure and risks were reviewed including but not limited to death, myocardial infarction, stroke, arrythmias, bleeding, transfusion, emergency surgery, dye allergy, or renal dysfunction. The patient voices understanding and is agreeable to proceed.   7. Acute blood loss iron deficiency anemia  8. Bright red blood per rectum. Secondary to internal hemorrhoids.

## 2016-10-03 ENCOUNTER — Other Ambulatory Visit: Payer: Self-pay | Admitting: Cardiology

## 2016-10-03 ENCOUNTER — Ambulatory Visit (INDEPENDENT_AMBULATORY_CARE_PROVIDER_SITE_OTHER): Payer: Medicare Other | Admitting: Cardiology

## 2016-10-03 ENCOUNTER — Encounter: Payer: Self-pay | Admitting: Cardiology

## 2016-10-03 VITALS — BP 140/60 | HR 64 | Ht 78.5 in | Wt 354.0 lb

## 2016-10-03 DIAGNOSIS — E119 Type 2 diabetes mellitus without complications: Secondary | ICD-10-CM

## 2016-10-03 DIAGNOSIS — R9439 Abnormal result of other cardiovascular function study: Secondary | ICD-10-CM

## 2016-10-03 DIAGNOSIS — I10 Essential (primary) hypertension: Secondary | ICD-10-CM

## 2016-10-03 DIAGNOSIS — Z7901 Long term (current) use of anticoagulants: Secondary | ICD-10-CM | POA: Diagnosis not present

## 2016-10-03 DIAGNOSIS — R072 Precordial pain: Secondary | ICD-10-CM

## 2016-10-03 DIAGNOSIS — I480 Paroxysmal atrial fibrillation: Secondary | ICD-10-CM

## 2016-10-03 LAB — CBC WITH DIFFERENTIAL/PLATELET
BASOS ABS: 352 {cells}/uL — AB (ref 0–200)
Basophils Relative: 2 %
EOS ABS: 528 {cells}/uL — AB (ref 15–500)
Eosinophils Relative: 3 %
HEMATOCRIT: 35.1 % — AB (ref 38.5–50.0)
Hemoglobin: 10.7 g/dL — ABNORMAL LOW (ref 13.2–17.1)
LYMPHS PCT: 21 %
Lymphs Abs: 3696 cells/uL (ref 850–3900)
MCH: 25.2 pg — AB (ref 27.0–33.0)
MCHC: 30.5 g/dL — AB (ref 32.0–36.0)
MCV: 82.6 fL (ref 80.0–100.0)
MONO ABS: 1408 {cells}/uL — AB (ref 200–950)
MONOS PCT: 8 %
NEUTROS PCT: 66 %
Neutro Abs: 11616 cells/uL — ABNORMAL HIGH (ref 1500–7800)
PLATELETS: 87 10*3/uL — AB (ref 140–400)
RBC: 4.25 MIL/uL (ref 4.20–5.80)
RDW: 21.2 % — AB (ref 11.0–15.0)
WBC: 17.6 10*3/uL — AB (ref 3.8–10.8)

## 2016-10-03 LAB — BASIC METABOLIC PANEL
BUN: 18 mg/dL (ref 7–25)
CO2: 25 mmol/L (ref 20–31)
Calcium: 9 mg/dL (ref 8.6–10.3)
Chloride: 103 mmol/L (ref 98–110)
Creat: 1.17 mg/dL (ref 0.70–1.18)
GLUCOSE: 169 mg/dL — AB (ref 65–99)
POTASSIUM: 3.8 mmol/L (ref 3.5–5.3)
SODIUM: 138 mmol/L (ref 135–146)

## 2016-10-03 NOTE — Patient Instructions (Addendum)
Cardiac Cath Friday 10/17/16 follow instructions given.    Follow Up Appointment with Dr.[name]  Monday 11/10/16 at 11:40 am.

## 2016-10-04 LAB — PROTIME-INR
INR: 1.3 — ABNORMAL HIGH
Prothrombin Time: 13.8 s — ABNORMAL HIGH (ref 9.0–11.5)

## 2016-10-06 ENCOUNTER — Ambulatory Visit
Admission: RE | Admit: 2016-10-06 | Discharge: 2016-10-06 | Disposition: A | Discharge status: Home / Self Care | Payer: Medicare Other | Source: Ambulatory Visit | Attending: Cardiology | Admitting: Cardiology

## 2016-10-06 DIAGNOSIS — I480 Paroxysmal atrial fibrillation: Secondary | ICD-10-CM

## 2016-10-06 DIAGNOSIS — I10 Essential (primary) hypertension: Secondary | ICD-10-CM

## 2016-10-06 DIAGNOSIS — Z7901 Long term (current) use of anticoagulants: Secondary | ICD-10-CM

## 2016-10-06 DIAGNOSIS — E119 Type 2 diabetes mellitus without complications: Secondary | ICD-10-CM

## 2016-10-06 DIAGNOSIS — R9439 Abnormal result of other cardiovascular function study: Secondary | ICD-10-CM

## 2016-10-06 DIAGNOSIS — R072 Precordial pain: Secondary | ICD-10-CM

## 2016-10-13 ENCOUNTER — Ambulatory Visit: Payer: Medicare Other | Admitting: Cardiology

## 2016-10-15 ENCOUNTER — Ambulatory Visit: Payer: Medicare Other | Admitting: Podiatry

## 2016-10-17 ENCOUNTER — Ambulatory Visit (HOSPITAL_COMMUNITY)
Admission: RE | Disposition: A | Discharge status: Home / Self Care | Payer: Self-pay | Source: Ambulatory Visit | Attending: Cardiology

## 2016-10-17 ENCOUNTER — Ambulatory Visit (HOSPITAL_COMMUNITY)
Admission: RE | Admit: 2016-10-17 | Discharge: 2016-10-17 | Disposition: A | Discharge status: Home / Self Care | Payer: Medicare Other | Source: Ambulatory Visit | Attending: Cardiology | Admitting: Cardiology

## 2016-10-17 DIAGNOSIS — K648 Other hemorrhoids: Secondary | ICD-10-CM | POA: Diagnosis not present

## 2016-10-17 DIAGNOSIS — I480 Paroxysmal atrial fibrillation: Secondary | ICD-10-CM | POA: Diagnosis not present

## 2016-10-17 DIAGNOSIS — Z87891 Personal history of nicotine dependence: Secondary | ICD-10-CM | POA: Diagnosis not present

## 2016-10-17 DIAGNOSIS — K625 Hemorrhage of anus and rectum: Secondary | ICD-10-CM | POA: Insufficient documentation

## 2016-10-17 DIAGNOSIS — I25119 Atherosclerotic heart disease of native coronary artery with unspecified angina pectoris: Secondary | ICD-10-CM | POA: Diagnosis not present

## 2016-10-17 DIAGNOSIS — I208 Other forms of angina pectoris: Secondary | ICD-10-CM | POA: Diagnosis not present

## 2016-10-17 DIAGNOSIS — Z7901 Long term (current) use of anticoagulants: Secondary | ICD-10-CM | POA: Diagnosis not present

## 2016-10-17 DIAGNOSIS — M069 Rheumatoid arthritis, unspecified: Secondary | ICD-10-CM | POA: Diagnosis not present

## 2016-10-17 DIAGNOSIS — Z8249 Family history of ischemic heart disease and other diseases of the circulatory system: Secondary | ICD-10-CM | POA: Insufficient documentation

## 2016-10-17 DIAGNOSIS — D509 Iron deficiency anemia, unspecified: Secondary | ICD-10-CM | POA: Diagnosis not present

## 2016-10-17 DIAGNOSIS — E669 Obesity, unspecified: Secondary | ICD-10-CM | POA: Diagnosis present

## 2016-10-17 DIAGNOSIS — I10 Essential (primary) hypertension: Secondary | ICD-10-CM | POA: Insufficient documentation

## 2016-10-17 DIAGNOSIS — Z6841 Body Mass Index (BMI) 40.0 and over, adult: Secondary | ICD-10-CM | POA: Insufficient documentation

## 2016-10-17 DIAGNOSIS — I209 Angina pectoris, unspecified: Secondary | ICD-10-CM | POA: Diagnosis present

## 2016-10-17 DIAGNOSIS — G8929 Other chronic pain: Secondary | ICD-10-CM | POA: Diagnosis not present

## 2016-10-17 DIAGNOSIS — G4730 Sleep apnea, unspecified: Secondary | ICD-10-CM | POA: Insufficient documentation

## 2016-10-17 DIAGNOSIS — E119 Type 2 diabetes mellitus without complications: Secondary | ICD-10-CM

## 2016-10-17 DIAGNOSIS — J449 Chronic obstructive pulmonary disease, unspecified: Secondary | ICD-10-CM | POA: Diagnosis not present

## 2016-10-17 DIAGNOSIS — R9439 Abnormal result of other cardiovascular function study: Secondary | ICD-10-CM

## 2016-10-17 DIAGNOSIS — I872 Venous insufficiency (chronic) (peripheral): Secondary | ICD-10-CM | POA: Diagnosis not present

## 2016-10-17 DIAGNOSIS — Z7984 Long term (current) use of oral hypoglycemic drugs: Secondary | ICD-10-CM | POA: Insufficient documentation

## 2016-10-17 DIAGNOSIS — R079 Chest pain, unspecified: Secondary | ICD-10-CM

## 2016-10-17 HISTORY — PX: LEFT HEART CATH AND CORONARY ANGIOGRAPHY: CATH118249

## 2016-10-17 LAB — GLUCOSE, CAPILLARY
GLUCOSE-CAPILLARY: 202 mg/dL — AB (ref 65–99)
GLUCOSE-CAPILLARY: 186 mg/dL — AB (ref 65–99)

## 2016-10-17 SURGERY — LEFT HEART CATH AND CORONARY ANGIOGRAPHY
Anesthesia: LOCAL

## 2016-10-17 MED ORDER — IOPAMIDOL (ISOVUE-370) INJECTION 76%
INTRAVENOUS | Status: AC
  Filled 2016-10-17: qty 100

## 2016-10-17 MED ORDER — SODIUM CHLORIDE 0.9 % IV SOLN: 250.0000 mL | INTRAVENOUS | Status: DC | PRN

## 2016-10-17 MED ORDER — MIDAZOLAM HCL 2 MG/2ML IJ SOLN
INTRAMUSCULAR | Status: AC
  Filled 2016-10-17: qty 2

## 2016-10-17 MED ORDER — VERAPAMIL HCL 2.5 MG/ML IV SOLN
INTRAVENOUS | Status: AC
  Filled 2016-10-17: qty 2

## 2016-10-17 MED ORDER — FENTANYL CITRATE (PF) 100 MCG/2ML IJ SOLN
INTRAMUSCULAR | Status: DC | PRN
  Administered 2016-10-17 (×3): 25 ug via INTRAVENOUS

## 2016-10-17 MED ORDER — HEPARIN (PORCINE) IN NACL 2-0.9 UNIT/ML-% IJ SOLN
INTRAMUSCULAR | Status: DC | PRN
  Administered 2016-10-17: 1000 mL

## 2016-10-17 MED ORDER — ASPIRIN 81 MG PO CHEW
CHEWABLE_TABLET | ORAL | Status: AC
  Administered 2016-10-17: 81 mg via ORAL
  Filled 2016-10-17: qty 1

## 2016-10-17 MED ORDER — SODIUM CHLORIDE 0.9 % IV SOLN: INTRAVENOUS | Status: AC

## 2016-10-17 MED ORDER — LIDOCAINE HCL (PF) 1 % IJ SOLN
INTRAMUSCULAR | Status: DC | PRN
  Administered 2016-10-17: 13 mL via INTRADERMAL
  Administered 2016-10-17: 2 mL via INTRADERMAL

## 2016-10-17 MED ORDER — FENTANYL CITRATE (PF) 100 MCG/2ML IJ SOLN
INTRAMUSCULAR | Status: AC
  Filled 2016-10-17: qty 2

## 2016-10-17 MED ORDER — SODIUM CHLORIDE 0.9% FLUSH: 3.0000 mL | INTRAVENOUS | Status: DC | PRN

## 2016-10-17 MED ORDER — MIDAZOLAM HCL 2 MG/2ML IJ SOLN
INTRAMUSCULAR | Status: DC | PRN
  Administered 2016-10-17: 1 mg via INTRAVENOUS
  Administered 2016-10-17: 2 mg via INTRAVENOUS

## 2016-10-17 MED ORDER — SODIUM CHLORIDE 0.9 % IV SOLN
INTRAVENOUS | Status: DC
  Administered 2016-10-17: 08:00:00 via INTRAVENOUS

## 2016-10-17 MED ORDER — FENTANYL CITRATE (PF) 100 MCG/2ML IJ SOLN
25.0000 ug | Freq: Once | INTRAMUSCULAR | Status: AC
  Administered 2016-10-17: 25 ug via INTRAVENOUS

## 2016-10-17 MED ORDER — SODIUM CHLORIDE 0.9% FLUSH: 3.0000 mL | Freq: Two times a day (BID) | INTRAVENOUS | Status: DC

## 2016-10-17 MED ORDER — ASPIRIN 81 MG PO CHEW
81.0000 mg | CHEWABLE_TABLET | ORAL | Status: AC
  Administered 2016-10-17: 81 mg via ORAL

## 2016-10-17 MED ORDER — IOPAMIDOL (ISOVUE-370) INJECTION 76%
INTRAVENOUS | Status: DC | PRN
  Administered 2016-10-17: 75 mL via INTRA_ARTERIAL

## 2016-10-17 MED ORDER — HEPARIN SODIUM (PORCINE) 1000 UNIT/ML IJ SOLN
INTRAMUSCULAR | Status: AC
  Filled 2016-10-17: qty 1

## 2016-10-17 MED ORDER — HEPARIN (PORCINE) IN NACL 2-0.9 UNIT/ML-% IJ SOLN
INTRAMUSCULAR | Status: AC
  Filled 2016-10-17: qty 1000

## 2016-10-17 MED ORDER — LIDOCAINE HCL (PF) 1 % IJ SOLN
INTRAMUSCULAR | Status: AC
  Filled 2016-10-17: qty 30

## 2016-10-17 SURGICAL SUPPLY — 13 items
CATH INFINITI 5FR MULTPACK ANG (CATHETERS) ×1 IMPLANT
COVER PRB 48X5XTLSCP FOLD TPE (BAG) IMPLANT
COVER PROBE 5X48 (BAG) ×2
GLIDESHEATH SLEND SS 6F .021 (SHEATH) ×1 IMPLANT
GUIDEWIRE INQWIRE 1.5J.035X260 (WIRE) IMPLANT
INQWIRE 1.5J .035X260CM (WIRE) ×2
KIT HEART LEFT (KITS) ×2 IMPLANT
PACK CARDIAC CATHETERIZATION (CUSTOM PROCEDURE TRAY) ×2 IMPLANT
SHEATH PINNACLE 5F 10CM (SHEATH) ×1 IMPLANT
SYR MEDRAD MARK V 150ML (SYRINGE) ×2 IMPLANT
TRANSDUCER W/STOPCOCK (MISCELLANEOUS) ×2 IMPLANT
TUBING CIL FLEX 10 FLL-RA (TUBING) ×2 IMPLANT
WIRE EMERALD 3MM-J .035X150CM (WIRE) ×1 IMPLANT

## 2016-10-17 NOTE — Interval H&P Note (Signed)
History and Physical Interval Note:  10/17/2016 8:54 AM  [name]  has presented today for surgery, with the diagnosis of abn nuclear study  The various methods of treatment have been discussed with the patient and family. After consideration of risks, benefits and other options for treatment, the patient has consented to  Procedure(s): Left Heart Cath and Coronary Angiography (N/A) as a surgical intervention .  The patient's history has been reviewed, patient examined, no change in status, stable for surgery.  I have reviewed the patient's chart and labs.  Questions were answered to the patient's satisfaction.    Cath Lab Visit (complete for each Cath Lab visit)  Clinical Evaluation Leading to the Procedure:   ACS: No.  Non-ACS:    Anginal Classification: CCS III  Anti-ischemic medical therapy: Maximal Therapy (2 or more classes of medications)  Non-Invasive Test Results: Low-risk stress test findings: cardiac mortality <1%/year  Prior CABG: No previous CABG       Theron Arista [facility] 10/17/2016 8:54 AM

## 2016-10-17 NOTE — Discharge Instructions (Addendum)
May resume Metformin on Monday, 10/21/15. May resume Xarelto tomorrow 10/18/16.   Femoral Site Care Refer to this sheet in the next few weeks. These instructions provide you with information about caring for yourself after your procedure. Your health care provider may also give you more specific instructions. Your treatment has been planned according to current medical practices, but problems sometimes occur. Call your health care provider if you have any problems or questions after your procedure. What can I expect after the procedure? After your procedure, it is typical to have the following:  Bruising at the site that usually fades within 1-2 weeks.  Blood collecting in the tissue (hematoma) that may be painful to the touch. It should usually decrease in size and tenderness within 1-2 weeks. Follow these instructions at home:  Take medicines only as directed by your health care provider.  You may shower 24-48 hours after the procedure or as directed by your health care provider. Remove the bandage (dressing) and gently wash the site with plain soap and water. Pat the area dry with a clean towel. Do not rub the site, because this may cause bleeding.  Do not take baths, swim, or use a hot tub until your health care provider approves.  Check your insertion site every day for redness, swelling, or drainage.  Do not apply powder or lotion to the site.  Limit use of stairs to twice a day for the first 2-3 days or as directed by your health care provider.  Do not squat for the first 2-3 days or as directed by your health care provider.  Do not lift over 10 lb (4.5 kg) for 5 days after your procedure or as directed by your health care provider.  Ask your health care provider when it is okay to:  Return to work or school.  Resume usual physical activities or sports.  Resume sexual activity.  Do not drive home if you are discharged the same day as the procedure. Have someone else drive  you.  You may drive 24 hours after the procedure unless otherwise instructed by your health care provider.  Do not operate machinery or power tools for 24 hours after the procedure or as directed by your health care provider.  If your procedure was done as an outpatient procedure, which means that you went home the same day as your procedure, a responsible adult should be with you for the first 24 hours after you arrive home.  Keep all follow-up visits as directed by your health care provider. This is important. Contact a health care provider if:  You have a fever.  You have chills.  You have increased bleeding from the site. Hold pressure on the site. Get help right away if:  You have unusual pain at the site.  You have redness, warmth, or swelling at the site.  You have drainage (other than a small amount of blood on the dressing) from the site.  The site is bleeding, and the bleeding does not stop after 30 minutes of holding steady pressure on the site.  Your leg or foot becomes pale, cool, tingly, or numb. This information is not intended to replace advice given to you by your health care provider. Make sure you discuss any questions you have with your health care provider. Document Released: 03/03/2014 Document Revised: 12/06/2015 Document Reviewed: 01/17/2014 Elsevier Interactive Patient Education  2017 [employer].

## 2016-10-17 NOTE — Progress Notes (Addendum)
Site area: RFA Site Prior to Removal:  Level 0 Pressure Applied For:25 min Manual:   yes Patient Status During Pull:  stable Post Pull Site:  Level Post Pull Instructions Given: yes  Post Pull Pulses Present: palpable Dressing Applied:  tegaderm Bedrest begins @ 1115  till 1515 Comments:

## 2016-10-17 NOTE — H&P (View-Only) (Signed)
History of Present Illness: [name] is seen today for follow up of atrial fibrillation and chest pain. He has a history of recurrent paroxysmal atrial fibrillation. He has been on chronic amiodarone therapy since September of 2011. He states this has worked very well for him and he has had very infrequent episodes of arrhythmia. This now is occuring occasionally but lasts less than 1 minute.   He also has a history of chronic chest pain with extensive evaluation in the past including cardiac cath, CT, and stress tests. Last stress Myoview was in 2012. He has a history of chronic venous stasis disease and had prior vein stripping by Dr. Hart [name].   He was admitted earlier in February with iron deficiency anemia and BRBPR. Hgb dropped to 8 and remained stable. He had recently been switched from coumadin to Xarelto. Colonoscopy showed internal hemorrhoids and tics but no active bleeding. He was given Fe transfusion. He complained of chest tightness and a Myoview study was done showing a small area of apical ischemia. EF 60%. This was felt to be low risk. Echo was unremarkable. Xarelto was resumed at DC. On follow up we recommended consideration of cardiac cath given his severe chest pain and mildly abnormal Myoview study. We wanted to wait until his Hgb had stabilized in case PCI was needed. He is now seen for follow up. He is followed by Dr. Bertis Ruddy and recent Hgb was up to 10.7. He was told he may need intermittent iron infusions.  On follow up today he states he is doing Ok. He has had no recurrent chest pain. Notes DOE and fatigue.  No evidence of recurrent bleeding.   Current Outpatient Prescriptions on File Prior to Visit  Medication Sig Dispense Refill  . albuterol (PROVENTIL HFA;VENTOLIN HFA) 108 (90 Base) MCG/ACT inhaler Inhale 2 puffs into the lungs every 4 (four) hours as needed for wheezing or shortness of breath.    Marland Kitchen amiodarone (PACERONE) 200 MG tablet TAKE 1 TABLET BY MOUTH  DAILY 90 tablet 1  .  ANORO ELLIPTA 62.5-25 MCG/INH AEPB INHALE 1 PUFF INTO THE LUNGS DAILY. 60 each 4  . atenolol (TENORMIN) 50 MG tablet Take 1 tablet (50 mg total) by mouth 2 (two) times daily. 180 tablet 3  . diltiazem (CARDIZEM CD) 180 MG 24 hr capsule TAKE 1 CAPSULE BY MOUTH  DAILY 90 capsule 1  . furosemide (LASIX) 40 MG tablet TAKE 1 TABLET BY MOUTH  DAILY 90 tablet 1  . hydrochlorothiazide (HYDRODIURIL) 25 MG tablet TAKE 1 TABLET BY MOUTH  DAILY 90 tablet 1  . HYDROcodone-acetaminophen (NORCO/VICODIN) 5-325 MG per tablet Take 1 tablet by mouth every 6 (six) hours as needed for moderate pain.     . hydrocortisone (ANUSOL-HC) 25 MG suppository Place 1 suppository (25 mg total) rectally 2 (two) times daily. 12 suppository 0  . metFORMIN (GLUCOPHAGE-XR) 500 MG 24 hr tablet Take 500 mg by mouth 2 (two) times daily.     Marland Kitchen omeprazole (PRILOSEC) 20 MG capsule TAKE 1 CAPSULE BY MOUTH  DAILY 90 capsule 1  . polyethylene glycol (MIRALAX / GLYCOLAX) packet Take 17 g by mouth 2 (two) times daily. 14 each 0  . potassium chloride SA (KLOR-CON M20) 20 MEQ tablet Take 1 tablet (20 mEq total) by mouth 2 (two) times daily. 180 tablet 3  . rivaroxaban (XARELTO) 20 MG TABS tablet Take 1 tablet (20 mg total) by mouth daily with supper. 30 tablet 6  . spironolactone (ALDACTONE) 25 MG tablet TAKE 1  TABLET BY MOUTH  DAILY 90 tablet 1   No current facility-administered medications on file prior to visit.     No Known Allergies  Past Medical History:  Diagnosis Date  . Chronic anticoagulation   . Chronic back pain   . Chronic chest pain   . Chronic venous insufficiency   . Emphysema   . HTN (hypertension)   . Obesity   . PAF (paroxysmal atrial fibrillation) (HCC)   . Pneumothorax   . Rheumatoid arthritis(714.0)     Past Surgical History:  Procedure Laterality Date  . ablation    . CARDIAC CATHETERIZATION  1998   NORMAL  . COLONOSCOPY WITH PROPOFOL N/A 08/25/2016   Procedure: COLONOSCOPY WITH PROPOFOL;  Surgeon: Carman Ching, MD;  Location: [facility];  Service: Endoscopy;  Laterality: N/A;  . HERNIA REPAIR    . PLEURAL SCARIFICATION    . VEIN LIGATION AND STRIPPING      History  Smoking Status  . Former Smoker  . Packs/day: 2.00  . Years: 30.00  . Types: Cigarettes  . Quit date: 03/14/1996  Smokeless Tobacco  . Never Used    History  Alcohol Use  . Yes    Comment: one beer per month    Family History  Problem Relation Age of Onset  . Other Father     CEREBRAL THROMBOSIS  . Cancer Mother     bone cancer  . Rheum arthritis Mother   . Heart failure Brother 50  . Coronary artery disease Brother 68    PTCA  . Rheum arthritis Brother   . Rheum arthritis Brother     Review of Systems: As noted in history of present illness. All other systems were reviewed and are negative.  Physical Exam: BP 140/60   Pulse 64   Ht 6' 6.5" (1.994 m)   Wt (!) 354 lb (160.6 kg)   BMI 40.39 kg/m  He is a pleasant, obese white male. He is in no acute distress. HEENT is unremarkable.Neck is supple. No masses.  Lungs are clear. Cardiac exam shows a regular rate and rhythm. No gallop or murmur. Abdomen is obese. There are no masses or tenderness to palpation. Extremities are full. He has chronic venous stasis disease with chronic brawny edema. Gait and ROM are intact. He has no gross neurologic deficits.  Laboratory data:   Lab Results  Component Value Date   WBC 15.9 (H) 09/26/2016   HGB 10.7 (L) 09/26/2016   HCT 35.8 (L) 09/26/2016   PLT 140 09/26/2016   GLUCOSE 153 (H) 08/22/2016   CHOL 163 09/24/2015   TRIG 174 (H) 09/24/2015   HDL 29 (L) 09/24/2015   LDLCALC 99 09/24/2015   ALT 14 (L) 08/20/2016   AST 18 08/20/2016   NA 138 08/22/2016   K 3.9 08/22/2016   CL 105 08/22/2016   CREATININE 1.01 08/22/2016   BUN 18 08/22/2016   CO2 25 08/22/2016   TSH 3.51 09/24/2015   INR 1.80 08/20/2016   HGBA1C 7.1 (H) 09/21/2013    Reviewed from primary care dated 08/20/16: A1c 9.1% CMET normal.    Myoview 08/22/16: MYOCARDIAL IMAGING WITH SPECT (REST AND PHARMACOLOGIC-STRESS)  GATED LEFT VENTRICULAR WALL MOTION STUDY  LEFT VENTRICULAR EJECTION FRACTION  TECHNIQUE: Standard myocardial SPECT imaging was performed after resting intravenous injection of 10 mCi Tc-47m tetrofosmin. Subsequently, intravenous infusion of Lexiscan was performed under the supervision of the Cardiology staff. At peak effect of the drug, 30 mCi Tc-52m tetrofosmin was injected intravenously  and standard myocardial SPECT imaging was performed. Quantitative gated imaging was also performed to evaluate left ventricular wall motion, and estimate left ventricular ejection fraction.  COMPARISON:  None.  FINDINGS: Perfusion: Small region of mild decrease counts in the apical segment of the lateral wall which improves from stress rest. No evidence of infarction.  Wall Motion: Normal left ventricular wall motion. No left ventricular dilation.  Left Ventricular Ejection Fraction: 60 %  End diastolic volume 192 ml  End systolic volume 77 ml  IMPRESSION: 1. Small region of potential ischemia in the apical segment of the lateral wall.  2. Normal left ventricular wall motion.  3. Left ventricular ejection fraction 60%  4. Non invasive risk stratification*: Low  *2012 Appropriate Use Criteria for Coronary Revascularization Focused Update: J Am Coll Cardiol. 2012;59(9):857-881. http://content.onlinejacc.org/article.aspx?articleid=1201161   Electronically Signed   By: Stewart  Edmunds M.D.   On: 08/23/2016 16:06  Echo 08/23/16: Study Conclusions  - Left ventricle: The cavity size was normal. Wall thickness was   increased in a pattern of moderate LVH. Systolic function was   normal. The estimated ejection fraction was in the range of 60%   to 65%. Wall motion was normal; there were no regional wall   motion abnormalities. Features are consistent with a pseudonormal   left  ventricular filling pattern, with concomitant abnormal   relaxation and increased filling pressure (grade 2 diastolic   dysfunction).  Assessment / Plan: 1. Paroxysmal atrial fibrillation. This is well controlled on amiodarone. We will continue amiodarone 100 mg daily and atenolol. On Xarelto for anticoagulation  2. Anticoagulation. Now on Xarelto.  3. Morbid obesity with sleep apnea. Continue efforts at weight loss.   4. Chronic venous insufficiency with severe chronic edema. Legs actually less swollen today.  Continue diuretic therapy with Lasix, HCTZ, and Aldactone. Continue potassium supplementation.   5. COPD/emphysema.   6. Chest pain. Symptoms in February consistent with typical angina. Long history of atypical chest pain in the past. Symptoms exacerbated by anemia.  Myoview showed small area of apical ischemia. EF normal. Given symptoms and myoview results I have recommended cardiac cath to define anatomy and risk. His Hgb has been stable this past month with no evidence of bleeding. I think it is safe to proceed with cardiac cath now with possible PCI if needed. Procedure scheduled for April 6. Will need to hold Xarelto 2 days prior. Hold metformin 2 days after. The procedure and risks were reviewed including but not limited to death, myocardial infarction, stroke, arrythmias, bleeding, transfusion, emergency surgery, dye allergy, or renal dysfunction. The patient voices understanding and is agreeable to proceed.   7. Acute blood loss iron deficiency anemia  8. Bright red blood per rectum. Secondary to internal hemorrhoids.      

## 2016-10-20 ENCOUNTER — Encounter (HOSPITAL_COMMUNITY): Payer: Self-pay | Admitting: Cardiology

## 2016-10-20 MED FILL — Fentanyl Citrate Preservative Free (PF) Inj 100 MCG/2ML: INTRAMUSCULAR | Qty: 2 | Status: AC

## 2016-10-20 MED FILL — Verapamil HCl IV Soln 2.5 MG/ML: INTRAVENOUS | Qty: 2 | Status: AC

## 2016-10-22 ENCOUNTER — Ambulatory Visit (INDEPENDENT_AMBULATORY_CARE_PROVIDER_SITE_OTHER): Payer: Medicare Other | Admitting: Podiatry

## 2016-10-22 DIAGNOSIS — B351 Tinea unguium: Secondary | ICD-10-CM | POA: Diagnosis not present

## 2016-10-22 DIAGNOSIS — E1142 Type 2 diabetes mellitus with diabetic polyneuropathy: Secondary | ICD-10-CM | POA: Diagnosis not present

## 2016-10-22 NOTE — Patient Instructions (Signed)

## 2016-10-22 NOTE — Progress Notes (Signed)
Patient ID: [name], male   DOB: 02-06-1946, 71 y.o.   MRN: [id number]    Subjective: This patient presents today for scheduled visit complaining of thickened and elongated toenails and requests toenail debridement  Objective: Orientated 3  Vascular: Bilateral peripheral edema DP pulse right 2/4 and 1/4 left PT pulses 2/4 bilaterally Capillary reflex immediate bilaterally  Neurological: Sensation to 10 g monofilament wire intact 2/5 right and 1/5 left Vibratory sensation nonreactive bilaterally Ankle reflexes trace reactive bilaterally  Dermatological: No open skin lesions bilaterally Brown hyperpigmentation lower legs and dorsal feet bilaterally Toenails are yellow, brittle, deformed 6-10 Atrophic skin without hair growth bilaterally  Musculoskeletal: Hallux malleus right Manual motor testing: Dorsi flexion, plantar flexion, inversion, eversion 5/5 bilaterally  Assessment: Stasis dermatitis bilaterally Venous  disease bilaterally Satisfactory arterial status, bilaterally 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

## 2016-10-28 ENCOUNTER — Other Ambulatory Visit: Payer: Self-pay | Admitting: Cardiology

## 2016-10-28 NOTE — Telephone Encounter (Signed)
Rx(s) sent to pharmacy electronically.  

## 2016-11-05 ENCOUNTER — Encounter: Payer: Self-pay | Admitting: *Deleted

## 2016-11-10 ENCOUNTER — Ambulatory Visit: Payer: Medicare Other | Admitting: Cardiology

## 2016-11-21 ENCOUNTER — Other Ambulatory Visit: Payer: Medicare Other

## 2016-11-22 NOTE — Progress Notes (Signed)
History of Present Illness: [name] is seen today for follow up after recent cardiac cath. He has a history of recurrent paroxysmal atrial fibrillation. He has been on chronic amiodarone therapy since September of 2011. He states this has worked very well for him and he has had very infrequent episodes of arrhythmia. This now is occuring occasionally but lasts less than 1 minute.   He also has a history of chronic chest pain with extensive evaluation in the past including cardiac cath, CT, and stress tests. Last stress Myoview was in 2012. He has a history of chronic venous stasis disease and had prior vein stripping by Dr. Hart [name].   He was admitted  in February 2018 with iron deficiency anemia and BRBPR. Hgb dropped to 8 and remained stable. He had recently been switched from coumadin to Xarelto. Colonoscopy showed internal hemorrhoids and tics but no active bleeding. He was given Fe transfusion. He complained of chest tightness and a Myoview study was done showing a small area of apical ischemia. EF 60%. This was felt to be low risk. Echo was unremarkable. Xarelto was resumed at DC. On follow up we recommended consideration of cardiac cath given his severe chest pain and mildly abnormal Myoview study. After his Hgb had stabilized we proceeded with Cardiac cath on 10/17/16. This showed no CAD and normal LV function. LVEDP was mildly elevated. Medical therapy recommended.   On follow up today he is doing very well. Denies any current chest pain or dyspnea. No palpitations. He still has some rectal bleeding from hemorrhoids. Notes he is planning to buy a house on the [employer].   Current Outpatient Prescriptions on File Prior to Visit  Medication Sig Dispense Refill  . albuterol (PROVENTIL HFA;VENTOLIN HFA) 108 (90 Base) MCG/ACT inhaler Inhale 2 puffs into the lungs every 4 (four) hours as needed for wheezing or shortness of breath.    Marland Kitchen amiodarone (PACERONE) 200 MG tablet TAKE 1 TABLET BY MOUTH  DAILY 90  tablet 1  . ANORO ELLIPTA 62.5-25 MCG/INH AEPB INHALE 1 PUFF INTO THE LUNGS DAILY. 60 each 4  . atenolol (TENORMIN) 50 MG tablet Take 1 tablet (50 mg total) by mouth 2 (two) times daily. 180 tablet 3  . diltiazem (CARDIZEM CD) 180 MG 24 hr capsule TAKE 1 CAPSULE BY MOUTH  DAILY 90 capsule 1  . furosemide (LASIX) 40 MG tablet TAKE 1 TABLET BY MOUTH  DAILY 90 tablet 1  . hydrochlorothiazide (HYDRODIURIL) 25 MG tablet TAKE 1 TABLET BY MOUTH  DAILY 90 tablet 1  . hydrocortisone (ANUSOL-HC) 25 MG suppository Place 1 suppository (25 mg total) rectally 2 (two) times daily. 12 suppository 0  . omeprazole (PRILOSEC) 20 MG capsule TAKE 1 CAPSULE BY MOUTH  DAILY 90 capsule 1  . polyethylene glycol (MIRALAX / GLYCOLAX) packet Take 17 g by mouth 2 (two) times daily. (Patient taking differently: Take 17 g by mouth 2 (two) times daily as needed for mild constipation. ) 14 each 0  . potassium chloride SA (K-DUR,KLOR-CON) 20 MEQ tablet TAKE ONE TABLET BY MOUTH TWICE A DAY 180 tablet 2  . spironolactone (ALDACTONE) 25 MG tablet TAKE 1 TABLET BY MOUTH  DAILY 90 tablet 1   No current facility-administered medications on file prior to visit.     No Known Allergies  Past Medical History:  Diagnosis Date  . Chronic anticoagulation   . Chronic back pain   . Chronic chest pain   . Chronic venous insufficiency   . Emphysema   .  HTN (hypertension)   . Obesity   . PAF (paroxysmal atrial fibrillation) (HCC)   . Pneumothorax   . Rheumatoid arthritis(714.0)     Past Surgical History:  Procedure Laterality Date  . ablation    . CARDIAC CATHETERIZATION  1998   NORMAL  . COLONOSCOPY WITH PROPOFOL N/A 08/25/2016   Procedure: COLONOSCOPY WITH PROPOFOL;  Surgeon: Carman Ching, MD;  Location: [facility];  Service: Endoscopy;  Laterality: N/A;  . HERNIA REPAIR    . LEFT HEART CATH AND CORONARY ANGIOGRAPHY N/A 10/17/2016   Procedure: Left Heart Cath and Coronary Angiography;  Surgeon: Sydney Azure M [place], MD;  Location:  [facility] INVASIVE CV LAB;  Service: Cardiovascular;  Laterality: N/A;  . PLEURAL SCARIFICATION    . VEIN LIGATION AND STRIPPING      History  Smoking Status  . Former Smoker  . Packs/day: 2.00  . Years: 30.00  . Types: Cigarettes  . Quit date: 03/14/1996  Smokeless Tobacco  . Never Used    History  Alcohol Use  . Yes    Comment: one beer per month    Family History  Problem Relation Age of Onset  . Other Father        CEREBRAL THROMBOSIS  . Rheum arthritis Mother   . Bone cancer Mother   . Heart failure Brother 50  . Coronary artery disease Brother 54       PTCA  . ALS Sister   . Rheum arthritis Brother   . Rheum arthritis Brother     Review of Systems: As noted in history of present illness. All other systems were reviewed and are negative.  Physical Exam: BP 136/62   Pulse 62   Ht 6\' 6"  (1.981 m)   Wt (!) 350 lb 6.4 oz (158.9 kg)   BMI 40.49 kg/m  He is a pleasant, obese white male. He is in no acute distress. HEENT is unremarkable.Neck is supple. No masses.  Lungs are clear. Cardiac exam shows a regular rate and rhythm. No gallop or murmur. Abdomen is obese. There are no masses or tenderness to palpation. Extremities reveal good radial pulses without hematoma at cath site.  He has chronic venous stasis disease with chronic brawny edema. Gait and ROM are intact. He has no gross neurologic deficits.  Laboratory data:   Lab Results  Component Value Date   WBC 17.6 (H) 10/03/2016   HGB 10.7 (L) 10/03/2016   HCT 35.1 (L) 10/03/2016   PLT 87 (L) 10/03/2016   GLUCOSE 169 (H) 10/03/2016   CHOL 163 09/24/2015   TRIG 174 (H) 09/24/2015   HDL 29 (L) 09/24/2015   LDLCALC 99 09/24/2015   ALT 14 (L) 08/20/2016   AST 18 08/20/2016   NA 138 10/03/2016   K 3.8 10/03/2016   CL 103 10/03/2016   CREATININE 1.17 10/03/2016   BUN 18 10/03/2016   CO2 25 10/03/2016   TSH 3.51 09/24/2015   INR 1.3 (H) 10/03/2016   HGBA1C 7.1 (H) 09/21/2013    Reviewed from primary care  dated 08/20/16: A1c 9.1% CMET normal.   Myoview 08/22/16: MYOCARDIAL IMAGING WITH SPECT (REST AND PHARMACOLOGIC-STRESS)  GATED LEFT VENTRICULAR WALL MOTION STUDY  LEFT VENTRICULAR EJECTION FRACTION  TECHNIQUE: Standard myocardial SPECT imaging was performed after resting intravenous injection of 10 mCi Tc-17m tetrofosmin. Subsequently, intravenous infusion of Lexiscan was performed under the supervision of the Cardiology staff. At peak effect of the drug, 30 mCi Tc-95m tetrofosmin was injected intravenously and standard myocardial SPECT imaging was  performed. Quantitative gated imaging was also performed to evaluate left ventricular wall motion, and estimate left ventricular ejection fraction.  COMPARISON:  None.  FINDINGS: Perfusion: Small region of mild decrease counts in the apical segment of the lateral wall which improves from stress rest. No evidence of infarction.  Wall Motion: Normal left ventricular wall motion. No left ventricular dilation.  Left Ventricular Ejection Fraction: 60 %  End diastolic volume 192 ml  End systolic volume 77 ml  IMPRESSION: 1. Small region of potential ischemia in the apical segment of the lateral wall.  2. Normal left ventricular wall motion.  3. Left ventricular ejection fraction 60%  4. Non invasive risk stratification*: Low  *2012 Appropriate Use Criteria for Coronary Revascularization Focused Update: J Am Coll Cardiol. 2012;59(9):857-881. http://content.dementiazones.com.aspx?articleid=1201161   Electronically Signed   By: Genevive Bi M.D.   On: 08/23/2016 16:06  Echo 08/23/16: Study Conclusions  - Left ventricle: The cavity size was normal. Wall thickness was   increased in a pattern of moderate LVH. Systolic function was   normal. The estimated ejection fraction was in the range of 60%   to 65%. Wall motion was normal; there were no regional wall   motion abnormalities. Features are consistent  with a pseudonormal   left ventricular filling pattern, with concomitant abnormal   relaxation and increased filling pressure (grade 2 diastolic   dysfunction).  Cardiac cath 10/17/16: Conclusion     Prox LAD lesion, 10 %stenosed.  Prox RCA to Mid RCA lesion, 15 %stenosed.  The left ventricular systolic function is normal.  LV end diastolic pressure is mildly elevated.   1. No significant CAD 2. Normal LV function 3. Mildly elevated LVEDP  Plan: medical management.       Assessment / Plan: 1. Paroxysmal atrial fibrillation. This is well controlled on amiodarone. We will continue amiodarone 100 mg daily and atenolol. On Xarelto for anticoagulation.   2. Anticoagulation. Now on Xarelto.  3. Morbid obesity with sleep apnea. Continue efforts at weight loss.   4. Chronic venous insufficiency with  chronic edema.  Continue diuretic therapy with Lasix, HCTZ, and Aldactone.   5. COPD/emphysema.   6. Chest pain.  Long history of atypical chest pain in the past. Symptoms exacerbated by anemia.  Myoview showed small area of apical ischemia. EF normal. Cardiac cath showed no significant CAD. Patient reassured.   7. Acute blood loss iron deficiency anemia  8. Internal hemorrhoids. Patient notes he had surgery in past and was not happy with results. May want to consider a second opinion.

## 2016-11-25 ENCOUNTER — Encounter: Payer: Self-pay | Admitting: Cardiology

## 2016-11-25 ENCOUNTER — Encounter (INDEPENDENT_AMBULATORY_CARE_PROVIDER_SITE_OTHER): Payer: Self-pay

## 2016-11-25 ENCOUNTER — Ambulatory Visit (INDEPENDENT_AMBULATORY_CARE_PROVIDER_SITE_OTHER): Payer: Medicare Other | Admitting: Cardiology

## 2016-11-25 VITALS — BP 136/62 | HR 62 | Ht 78.0 in | Wt 350.4 lb

## 2016-11-25 DIAGNOSIS — I10 Essential (primary) hypertension: Secondary | ICD-10-CM

## 2016-11-25 DIAGNOSIS — R079 Chest pain, unspecified: Secondary | ICD-10-CM | POA: Diagnosis not present

## 2016-11-25 DIAGNOSIS — E119 Type 2 diabetes mellitus without complications: Secondary | ICD-10-CM | POA: Diagnosis not present

## 2016-11-25 DIAGNOSIS — G8929 Other chronic pain: Secondary | ICD-10-CM | POA: Diagnosis not present

## 2016-11-25 DIAGNOSIS — I480 Paroxysmal atrial fibrillation: Secondary | ICD-10-CM

## 2016-11-25 NOTE — Patient Instructions (Signed)
Continue your current therapy  I will see you in 6 months.   

## 2016-11-27 ENCOUNTER — Ambulatory Visit (HOSPITAL_BASED_OUTPATIENT_CLINIC_OR_DEPARTMENT_OTHER): Payer: Medicare Other

## 2016-11-27 ENCOUNTER — Telehealth: Payer: Self-pay

## 2016-11-27 DIAGNOSIS — D62 Acute posthemorrhagic anemia: Secondary | ICD-10-CM

## 2016-11-27 LAB — CBC & DIFF AND RETIC
BASO%: 0.6 % (ref 0.0–2.0)
Basophils Absolute: 0.2 10*3/uL — ABNORMAL HIGH (ref 0.0–0.1)
EOS ABS: 0.4 10*3/uL (ref 0.0–0.5)
EOS%: 1.7 % (ref 0.0–7.0)
HEMATOCRIT: 34.6 % — AB (ref 38.4–49.9)
HEMOGLOBIN: 10.5 g/dL — AB (ref 13.0–17.1)
IMMATURE RETIC FRACT: 29.2 % — AB (ref 3.00–10.60)
LYMPH#: 2.8 10*3/uL (ref 0.9–3.3)
LYMPH%: 11.3 % — AB (ref 14.0–49.0)
MCH: 25.7 pg — ABNORMAL LOW (ref 27.2–33.4)
MCHC: 30.3 g/dL — ABNORMAL LOW (ref 32.0–36.0)
MCV: 84.8 fL (ref 79.3–98.0)
MONO#: 2.7 10*3/uL — ABNORMAL HIGH (ref 0.1–0.9)
MONO%: 10.6 % (ref 0.0–14.0)
NEUT#: 18.9 10*3/uL — ABNORMAL HIGH (ref 1.5–6.5)
NEUT%: 75.8 % — AB (ref 39.0–75.0)
PLATELETS: 121 10*3/uL — AB (ref 140–400)
RBC: 4.08 10*6/uL — ABNORMAL LOW (ref 4.20–5.82)
RDW: 18.9 % — AB (ref 11.0–14.6)
Retic %: 3.13 % — ABNORMAL HIGH (ref 0.80–1.80)
Retic Ct Abs: 127.7 10*3/uL — ABNORMAL HIGH (ref 34.80–93.90)
WBC: 25 10*3/uL — ABNORMAL HIGH (ref 4.0–10.3)

## 2016-11-27 LAB — TECHNOLOGIST REVIEW

## 2016-11-27 NOTE — Telephone Encounter (Signed)
Called and left message to call us back regarding appt tomorrow.

## 2016-11-27 NOTE — Telephone Encounter (Signed)
Called patient back, he left a message. He will come in this afternoon at 3:30 for labs and keep his appt for tomorrow.

## 2016-11-28 ENCOUNTER — Encounter: Payer: Self-pay | Admitting: Hematology and Oncology

## 2016-11-28 ENCOUNTER — Telehealth: Payer: Self-pay | Admitting: Hematology and Oncology

## 2016-11-28 ENCOUNTER — Ambulatory Visit (HOSPITAL_BASED_OUTPATIENT_CLINIC_OR_DEPARTMENT_OTHER): Payer: Medicare Other | Admitting: Hematology and Oncology

## 2016-11-28 ENCOUNTER — Ambulatory Visit: Payer: Medicare Other

## 2016-11-28 VITALS — BP 147/40 | HR 65 | Temp 97.9°F | Resp 18 | Ht 78.0 in | Wt 346.3 lb

## 2016-11-28 DIAGNOSIS — D500 Iron deficiency anemia secondary to blood loss (chronic): Secondary | ICD-10-CM | POA: Diagnosis not present

## 2016-11-28 DIAGNOSIS — L97521 Non-pressure chronic ulcer of other part of left foot limited to breakdown of skin: Secondary | ICD-10-CM | POA: Diagnosis not present

## 2016-11-28 DIAGNOSIS — D72829 Elevated white blood cell count, unspecified: Secondary | ICD-10-CM

## 2016-11-28 DIAGNOSIS — K625 Hemorrhage of anus and rectum: Secondary | ICD-10-CM | POA: Diagnosis not present

## 2016-11-28 DIAGNOSIS — D62 Acute posthemorrhagic anemia: Secondary | ICD-10-CM

## 2016-11-28 DIAGNOSIS — D72825 Bandemia: Secondary | ICD-10-CM

## 2016-11-28 LAB — SEDIMENTATION RATE: SED RATE: 73 mm/h — AB (ref 0–30)

## 2016-11-28 LAB — FERRITIN: FERRITIN: 86 ng/mL (ref 22–316)

## 2016-11-28 MED ORDER — CEPHALEXIN 500 MG PO CAPS: 500.0000 mg | ORAL_CAPSULE | Freq: Three times a day (TID) | ORAL | 0 refills | Status: DC

## 2016-11-28 NOTE — Telephone Encounter (Signed)
Gave patient AVS and calender per 5/18 los.  

## 2016-11-28 NOTE — Assessment & Plan Note (Signed)
He has chronic GI bleed from hemorrhoids due to chronic anticoagulation therapy His blood count and serum ferritin is adequate He is placed on anticoagulation therapy by his cardiologist for his cardiac reasons For now, recommend he continue anticoagulation as directed For his GI bleed, I recommend he consult with his gastroenterologist to see if there is anything can be done From my standpoint, I will continue to recheck his blood work and iron studies all every 3 months and replace if needed He does not need iron infusion today

## 2016-11-28 NOTE — Assessment & Plan Note (Signed)
He has acute on chronic leukocytosis He has recent low-grade fever and evidence of nonhealing foot ulcer I recommend prescription Keflex for now and recommend he calls the wound center for further management I will see him back in 3 months

## 2016-11-28 NOTE — Progress Notes (Signed)
[facility] OFFICE PROGRESS NOTE  Mand, Ricky Revel, [name]:  This patient was last seen almost 3 years ago. He was initially referred to see me due to chronic leukocytosis which was thought to be related to prednisone therapy for rheumatoid arthritis. The patient is referred back to see me because of recent severe iron deficiency anemia. I reviewed his records extensively.  The patient is morbidly obese with diabetes and heart disease. He has paroxysmal atrial fibrillation and was initially treated with warfarin therapy and recently switched to Xarelto. In February 2018, he presented with signs and symptoms of GI bleed.  He received intravenous iron treatment.  He is referred here for further management INTERVAL HISTORY: [name] 71 y.o. male returns for further follow-up. He has been complaining of low-grade fever on and off for the last 2 days Denies sinus congestion, sore throat or cough Denies dysuria, frequency or urgency He has persistent nonhealing foot ulcer that seems to be draining recently He complained of chronic hemorrhoidal bleeding  I have reviewed the past medical history, past surgical history, social history and family history with the patient and they are unchanged from previous note.  ALLERGIES:  has No Known Allergies.  MEDICATIONS:  Current Outpatient Prescriptions  Medication Sig Dispense Refill  . albuterol (PROVENTIL HFA;VENTOLIN HFA) 108 (90 Base) MCG/ACT inhaler Inhale 2 puffs into the lungs every 4 (four) hours as needed for wheezing or shortness of breath.    Marland Kitchen amiodarone (PACERONE) 200 MG tablet TAKE 1 TABLET BY MOUTH  DAILY 90 tablet 1  . ANORO ELLIPTA 62.5-25 MCG/INH AEPB INHALE 1 PUFF INTO THE LUNGS DAILY. 60 each 4  . atenolol (TENORMIN) 50 MG tablet Take 1 tablet (50 mg total) by mouth 2 (two) times daily. 180 tablet 3  . diltiazem (CARDIZEM CD) 180 MG 24 hr capsule TAKE 1 CAPSULE BY MOUTH  DAILY 90  capsule 1  . furosemide (LASIX) 40 MG tablet TAKE 1 TABLET BY MOUTH  DAILY 90 tablet 1  . hydrochlorothiazide (HYDRODIURIL) 25 MG tablet TAKE 1 TABLET BY MOUTH  DAILY 90 tablet 1  . hydrocortisone (ANUSOL-HC) 25 MG suppository Place 1 suppository (25 mg total) rectally 2 (two) times daily. 12 suppository 0  . metFORMIN (GLUCOPHAGE-XR) 500 MG 24 hr tablet Take 1 tablet by mouth 2 (two) times daily.    Marland Kitchen omeprazole (PRILOSEC) 20 MG capsule TAKE 1 CAPSULE BY MOUTH  DAILY 90 capsule 1  . polyethylene glycol (MIRALAX / GLYCOLAX) packet Take 17 g by mouth 2 (two) times daily. (Patient taking differently: Take 17 g by mouth 2 (two) times daily as needed for mild constipation. ) 14 each 0  . potassium chloride SA (K-DUR,KLOR-CON) 20 MEQ tablet TAKE ONE TABLET BY MOUTH TWICE A DAY 180 tablet 2  . spironolactone (ALDACTONE) 25 MG tablet TAKE 1 TABLET BY MOUTH  DAILY 90 tablet 1  . XARELTO 20 MG TABS tablet Take 1 tablet by mouth daily.    . cephALEXin (KEFLEX) 500 MG capsule Take 1 capsule (500 mg total) by mouth 3 (three) times daily. 30 capsule 0   No current facility-administered medications for this visit.      REVIEW OF SYSTEMS:   Constitutional: Denies fevers, chills or night sweats Eyes: Denies blurriness of vision Ears, nose, mouth, throat, and face: Denies mucositis or sore throat Respiratory: Denies cough, dyspnea or wheezes Cardiovascular: Denies palpitation, chest discomfort or lower extremity swelling Gastrointestinal:  Denies nausea, heartburn or change  in bowel habits Lymphatics: Denies new lymphadenopathy or easy bruising Neurological:Denies numbness, tingling or new weaknesses Behavioral/Psych: Mood is stable, no new changes  All other systems were reviewed with the patient and are negative.  PHYSICAL EXAMINATION: ECOG PERFORMANCE STATUS: 1 - Symptomatic but completely ambulatory  Vitals:   11/28/16 1012  BP: (!) 147/40  Pulse: 65  Resp: 18  Temp: 97.9 F (36.6 C)   Filed  Weights   11/28/16 1012  Weight: (!) 346 lb 4.8 oz (157.1 kg)    GENERAL:alert, no distress and comfortable.  He is morbidly obese SKIN: Noted nonhealing foot ulcer.  Chronic dermatitis changes in both legs HEART: he has moderate to severe bilateral lower extremity edema Musculoskeletal:no cyanosis of digits and no clubbing  NEURO: alert & oriented x 3 with fluent speech, no focal motor/sensory deficits  LABORATORY DATA:  I have reviewed the data as listed     Component Value Date/Time   NA 138 10/03/2016 1037   K 3.8 10/03/2016 1037   CL 103 10/03/2016 1037   CO2 25 10/03/2016 1037   GLUCOSE 169 (H) 10/03/2016 1037   BUN 18 10/03/2016 1037   CREATININE 1.17 10/03/2016 1037   CALCIUM 9.0 10/03/2016 1037   PROT 7.8 08/20/2016 2230   ALBUMIN 3.8 08/20/2016 2230   AST 18 08/20/2016 2230   ALT 14 (L) 08/20/2016 2230   ALKPHOS 58 08/20/2016 2230   BILITOT 0.7 08/20/2016 2230   GFRNONAA >60 08/22/2016 0541   GFRAA >60 08/22/2016 0541    No results found for: SPEP, UPEP  Lab Results  Component Value Date   WBC 25.0 (H) 11/27/2016   NEUTROABS 18.9 (H) 11/27/2016   HGB 10.5 (L) 11/27/2016   HCT 34.6 (L) 11/27/2016   MCV 84.8 11/27/2016   PLT 121 (L) 11/27/2016      Chemistry      Component Value Date/Time   NA 138 10/03/2016 1037   K 3.8 10/03/2016 1037   CL 103 10/03/2016 1037   CO2 25 10/03/2016 1037   BUN 18 10/03/2016 1037   CREATININE 1.17 10/03/2016 1037      Component Value Date/Time   CALCIUM 9.0 10/03/2016 1037   ALKPHOS 58 08/20/2016 2230   AST 18 08/20/2016 2230   ALT 14 (L) 08/20/2016 2230   BILITOT 0.7 08/20/2016 2230      ASSESSMENT & PLAN:  Acute blood loss anemia He has chronic GI bleed from hemorrhoids due to chronic anticoagulation therapy His blood count and serum ferritin is adequate He is placed on anticoagulation therapy by his cardiologist for his cardiac reasons For now, recommend he continue anticoagulation as directed For his GI  bleed, I recommend he consult with his gastroenterologist to see if there is anything can be done From my standpoint, I will continue to recheck his blood work and iron studies all every 3 months and replace if needed He does not need iron infusion today  Leukocytosis He has acute on chronic leukocytosis He has recent low-grade fever and evidence of nonhealing foot ulcer I recommend prescription Keflex for now and recommend he calls the wound center for further management I will see him back in 3 months  Chronic ulcer of left foot (HCC) He has persistent, acute on chronic nonhealing foot ulcer Recommend wound center evaluation   Orders Placed This Encounter  Procedures  . Ferritin    Standing Status:   Future    Standing Expiration Date:   01/02/2018  . Sedimentation rate  Standing Status:   Future    Standing Expiration Date:   01/02/2018  . Iron and TIBC    Standing Status:   Future    Standing Expiration Date:   01/02/2018    All questions were answered. The patient knows to call the clinic with any problems, questions or concerns. No barriers to learning was detected.  I spent 15 minutes counseling the patient face to face. The total time spent in the appointment was 20 minutes and more than 50% was on counseling.     Artis Delay, MD 5/18/201811:19 AM

## 2016-11-28 NOTE — Assessment & Plan Note (Signed)
He has persistent, acute on chronic nonhealing foot ulcer Recommend wound center evaluation

## 2016-12-15 NOTE — Anesthesia Postprocedure Evaluation (Signed)
Anesthesia Post Note  Patient: [name]  Procedure(s) Performed: Procedure(s) (LRB): COLONOSCOPY WITH PROPOFOL (N/A)     Anesthesia Post Evaluation  Last Vitals:  Vitals:   08/25/16 1053 08/25/16 1313  BP: 110/63 (!) 139/45  Pulse: 74 70  Resp: 16 18  Temp: 36.6 C 36.6 C    Last Pain:  Vitals:   08/25/16 1413  TempSrc:   PainSc: Asleep                 Jazmine Heckman S

## 2016-12-15 NOTE — Addendum Note (Signed)
Addendum  created 12/15/16 1105 by Eilene Ghazi, MD   Sign clinical note

## 2017-01-15 ENCOUNTER — Ambulatory Visit (HOSPITAL_COMMUNITY)
Admission: RE | Admit: 2017-01-15 | Discharge: 2017-01-15 | Disposition: A | Discharge status: Home / Self Care | Payer: Medicare Other | Source: Ambulatory Visit | Attending: Internal Medicine | Admitting: Internal Medicine

## 2017-01-15 ENCOUNTER — Other Ambulatory Visit: Payer: Self-pay | Admitting: Internal Medicine

## 2017-01-15 ENCOUNTER — Encounter (HOSPITAL_BASED_OUTPATIENT_CLINIC_OR_DEPARTMENT_OTHER): Payer: Medicare Other | Attending: Internal Medicine

## 2017-01-15 DIAGNOSIS — D72829 Elevated white blood cell count, unspecified: Secondary | ICD-10-CM | POA: Diagnosis not present

## 2017-01-15 DIAGNOSIS — M86172 Other acute osteomyelitis, left ankle and foot: Secondary | ICD-10-CM

## 2017-01-15 DIAGNOSIS — S91102A Unspecified open wound of left great toe without damage to nail, initial encounter: Secondary | ICD-10-CM | POA: Insufficient documentation

## 2017-01-15 DIAGNOSIS — Z87891 Personal history of nicotine dependence: Secondary | ICD-10-CM | POA: Diagnosis not present

## 2017-01-15 DIAGNOSIS — M069 Rheumatoid arthritis, unspecified: Secondary | ICD-10-CM | POA: Insufficient documentation

## 2017-01-15 DIAGNOSIS — J449 Chronic obstructive pulmonary disease, unspecified: Secondary | ICD-10-CM | POA: Diagnosis not present

## 2017-01-15 DIAGNOSIS — I10 Essential (primary) hypertension: Secondary | ICD-10-CM | POA: Insufficient documentation

## 2017-01-15 DIAGNOSIS — M899 Disorder of bone, unspecified: Secondary | ICD-10-CM | POA: Insufficient documentation

## 2017-01-15 DIAGNOSIS — L97521 Non-pressure chronic ulcer of other part of left foot limited to breakdown of skin: Secondary | ICD-10-CM | POA: Insufficient documentation

## 2017-01-15 DIAGNOSIS — E11621 Type 2 diabetes mellitus with foot ulcer: Secondary | ICD-10-CM | POA: Insufficient documentation

## 2017-01-15 DIAGNOSIS — E1142 Type 2 diabetes mellitus with diabetic polyneuropathy: Secondary | ICD-10-CM | POA: Insufficient documentation

## 2017-01-15 DIAGNOSIS — X58XXXA Exposure to other specified factors, initial encounter: Secondary | ICD-10-CM | POA: Diagnosis not present

## 2017-01-21 ENCOUNTER — Ambulatory Visit (INDEPENDENT_AMBULATORY_CARE_PROVIDER_SITE_OTHER): Payer: Medicare Other | Admitting: Podiatry

## 2017-01-21 DIAGNOSIS — B351 Tinea unguium: Secondary | ICD-10-CM | POA: Diagnosis not present

## 2017-01-21 DIAGNOSIS — E1142 Type 2 diabetes mellitus with diabetic polyneuropathy: Secondary | ICD-10-CM

## 2017-01-22 DIAGNOSIS — E11621 Type 2 diabetes mellitus with foot ulcer: Secondary | ICD-10-CM | POA: Diagnosis not present

## 2017-01-22 NOTE — Patient Instructions (Signed)

## 2017-01-22 NOTE — Progress Notes (Signed)
Patient ID: [name], male   DOB: [birth year], 71 y.o.   MRN: [id number]    Subjective: This patient presents today for scheduled visit complaining of thickened and elongated toenails and requests toenail debridement  Objective: Orientated 3  Vascular: Bilateral peripheral edema DP pulse right 2/4 and 1/4 left PT pulses 2/4 bilaterally Capillary reflex immediate bilaterally  Neurological: Sensation to 10 g monofilament wire intact 2/5 right and 1/5 left Vibratory sensation nonreactive bilaterally Ankle reflexes trace reactive bilaterally  Dermatological: No open skin lesions bilaterally 5 mm eschar distal left hallux with a history of treatment by wound care center Brown hyperpigmentation lower legs and dorsal feet bilaterally Toenails are yellow, brittle, deformed 6-10 Atrophic skin without hair growth bilaterally  Musculoskeletal: Hallux malleus right Manual motor testing: Dorsi flexion, plantar flexion, inversion, eversion 5/5 bilaterally  Assessment: History of recurrent skin ulcer distal left hallux associated with peripheral neuropathy and deformity Stasis dermatitis bilaterally Venous disease bilaterally Satisfactory arterial status, bilaterally 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

## 2017-02-09 ENCOUNTER — Other Ambulatory Visit: Payer: Self-pay | Admitting: Internal Medicine

## 2017-02-09 DIAGNOSIS — E11621 Type 2 diabetes mellitus with foot ulcer: Secondary | ICD-10-CM | POA: Diagnosis not present

## 2017-02-09 DIAGNOSIS — M869 Osteomyelitis, unspecified: Secondary | ICD-10-CM

## 2017-02-09 DIAGNOSIS — S91109D Unspecified open wound of unspecified toe(s) without damage to nail, subsequent encounter: Secondary | ICD-10-CM

## 2017-02-16 ENCOUNTER — Ambulatory Visit (HOSPITAL_COMMUNITY)
Admission: RE | Admit: 2017-02-16 | Discharge: 2017-02-16 | Disposition: A | Discharge status: Home / Self Care | Payer: Medicare Other | Source: Ambulatory Visit | Attending: Internal Medicine | Admitting: Internal Medicine

## 2017-02-16 DIAGNOSIS — R600 Localized edema: Secondary | ICD-10-CM | POA: Insufficient documentation

## 2017-02-16 DIAGNOSIS — E11621 Type 2 diabetes mellitus with foot ulcer: Secondary | ICD-10-CM | POA: Diagnosis present

## 2017-02-16 DIAGNOSIS — M869 Osteomyelitis, unspecified: Secondary | ICD-10-CM

## 2017-02-16 DIAGNOSIS — S91109D Unspecified open wound of unspecified toe(s) without damage to nail, subsequent encounter: Secondary | ICD-10-CM

## 2017-02-16 DIAGNOSIS — L97521 Non-pressure chronic ulcer of other part of left foot limited to breakdown of skin: Secondary | ICD-10-CM | POA: Diagnosis present

## 2017-02-16 LAB — CREATININE, SERUM
CREATININE: 1.32 mg/dL — AB (ref 0.61–1.24)
GFR calc Af Amer: >60 mL/min (ref >60)
GFR, EST NON AFRICAN AMERICAN: 53 mL/min — AB (ref >60)

## 2017-02-16 MED ORDER — GADOBENATE DIMEGLUMINE 529 MG/ML IV SOLN
20.0000 mL | Freq: Once | INTRAVENOUS | Status: AC
  Administered 2017-02-16: 20 mL via INTRAVENOUS

## 2017-02-20 ENCOUNTER — Other Ambulatory Visit (HOSPITAL_BASED_OUTPATIENT_CLINIC_OR_DEPARTMENT_OTHER): Payer: Medicare Other

## 2017-02-20 DIAGNOSIS — D62 Acute posthemorrhagic anemia: Secondary | ICD-10-CM

## 2017-02-20 DIAGNOSIS — D500 Iron deficiency anemia secondary to blood loss (chronic): Secondary | ICD-10-CM | POA: Diagnosis not present

## 2017-02-20 DIAGNOSIS — D72825 Bandemia: Secondary | ICD-10-CM

## 2017-02-20 DIAGNOSIS — L97521 Non-pressure chronic ulcer of other part of left foot limited to breakdown of skin: Secondary | ICD-10-CM

## 2017-02-20 LAB — CBC & DIFF AND RETIC
BASO%: 1.5 % (ref 0.0–2.0)
Basophils Absolute: 0.3 10*3/uL — ABNORMAL HIGH (ref 0.0–0.1)
EOS%: 2.6 % (ref 0.0–7.0)
Eosinophils Absolute: 0.5 10*3/uL (ref 0.0–0.5)
HEMATOCRIT: 35.5 % — AB (ref 38.4–49.9)
HGB: 10.9 g/dL — ABNORMAL LOW (ref 13.0–17.1)
Immature Retic Fract: 23.1 % — ABNORMAL HIGH (ref 3.00–10.60)
LYMPH%: 19.5 % (ref 14.0–49.0)
MCH: 24.6 pg — AB (ref 27.2–33.4)
MCHC: 30.7 g/dL — AB (ref 32.0–36.0)
MCV: 80.2 fL (ref 79.3–98.0)
MONO#: 1.9 10*3/uL — ABNORMAL HIGH (ref 0.1–0.9)
MONO%: 9.5 % (ref 0.0–14.0)
NEUT%: 66.9 % (ref 39.0–75.0)
NEUTROS ABS: 13.1 10*3/uL — AB (ref 1.5–6.5)
Platelets: 110 10*3/uL — ABNORMAL LOW (ref 140–400)
RBC: 4.43 10*6/uL (ref 4.20–5.82)
RDW: 20 % — AB (ref 11.0–14.6)
RETIC %: 3.03 % — AB (ref 0.80–1.80)
Retic Ct Abs: 134.23 10*3/uL — ABNORMAL HIGH (ref 34.80–93.90)
WBC: 19.6 10*3/uL — AB (ref 4.0–10.3)
lymph#: 3.8 10*3/uL — ABNORMAL HIGH (ref 0.9–3.3)

## 2017-02-20 LAB — TECHNOLOGIST REVIEW

## 2017-02-21 ENCOUNTER — Other Ambulatory Visit: Payer: Self-pay | Admitting: Emergency Medicine

## 2017-02-21 LAB — SEDIMENTATION RATE: Sedimentation Rate-Westergren: 63 mm/hr — ABNORMAL HIGH (ref 0–30)

## 2017-02-23 ENCOUNTER — Telehealth: Payer: Self-pay

## 2017-02-23 ENCOUNTER — Other Ambulatory Visit: Payer: Self-pay | Admitting: Hematology and Oncology

## 2017-02-23 ENCOUNTER — Encounter (HOSPITAL_BASED_OUTPATIENT_CLINIC_OR_DEPARTMENT_OTHER): Payer: Medicare Other | Attending: Internal Medicine

## 2017-02-23 DIAGNOSIS — Z8631 Personal history of diabetic foot ulcer: Secondary | ICD-10-CM | POA: Diagnosis not present

## 2017-02-23 DIAGNOSIS — I10 Essential (primary) hypertension: Secondary | ICD-10-CM | POA: Diagnosis not present

## 2017-02-23 DIAGNOSIS — J449 Chronic obstructive pulmonary disease, unspecified: Secondary | ICD-10-CM | POA: Insufficient documentation

## 2017-02-23 DIAGNOSIS — Z09 Encounter for follow-up examination after completed treatment for conditions other than malignant neoplasm: Secondary | ICD-10-CM | POA: Insufficient documentation

## 2017-02-23 DIAGNOSIS — E1140 Type 2 diabetes mellitus with diabetic neuropathy, unspecified: Secondary | ICD-10-CM | POA: Insufficient documentation

## 2017-02-23 LAB — IRON AND TIBC
%SAT: 7 % — ABNORMAL LOW (ref 20–55)
Iron: 29 ug/dL — ABNORMAL LOW (ref 42–163)
TIBC: 383 ug/dL (ref 202–409)
UIBC: 354 ug/dL (ref 117–376)

## 2017-02-23 LAB — FERRITIN: FERRITIN: 30 ng/mL (ref 22–316)

## 2017-02-23 NOTE — Telephone Encounter (Signed)
-----   Message from Artis Delay, MD sent at 02/23/2017  9:17 AM EDT ----- Regarding: labs Please remind him to show up on Friday He will need IV iron when I see him ----- Message ----- From: Interface, Lab In Three Zero One Sent: 02/20/2017   3:25 PM To: Artis Delay, MD

## 2017-02-23 NOTE — Telephone Encounter (Signed)
Called with below message, instructed to call with questions.

## 2017-02-25 ENCOUNTER — Other Ambulatory Visit: Payer: Self-pay | Admitting: Cardiology

## 2017-02-27 ENCOUNTER — Ambulatory Visit (HOSPITAL_BASED_OUTPATIENT_CLINIC_OR_DEPARTMENT_OTHER): Payer: Medicare Other

## 2017-02-27 ENCOUNTER — Ambulatory Visit (HOSPITAL_BASED_OUTPATIENT_CLINIC_OR_DEPARTMENT_OTHER): Payer: Medicare Other | Admitting: Hematology and Oncology

## 2017-02-27 ENCOUNTER — Encounter: Payer: Self-pay | Admitting: Hematology and Oncology

## 2017-02-27 VITALS — BP 139/47 | HR 70 | Temp 98.5°F | Resp 18 | Ht 78.0 in | Wt 348.3 lb

## 2017-02-27 DIAGNOSIS — D62 Acute posthemorrhagic anemia: Secondary | ICD-10-CM

## 2017-02-27 DIAGNOSIS — K625 Hemorrhage of anus and rectum: Secondary | ICD-10-CM

## 2017-02-27 DIAGNOSIS — Z7901 Long term (current) use of anticoagulants: Secondary | ICD-10-CM

## 2017-02-27 DIAGNOSIS — D500 Iron deficiency anemia secondary to blood loss (chronic): Secondary | ICD-10-CM | POA: Diagnosis not present

## 2017-02-27 DIAGNOSIS — D72825 Bandemia: Secondary | ICD-10-CM | POA: Diagnosis not present

## 2017-02-27 MED ORDER — SODIUM CHLORIDE 0.9 % IV SOLN
Freq: Once | INTRAVENOUS | Status: AC
  Administered 2017-02-27: 11:00:00 via INTRAVENOUS

## 2017-02-27 MED ORDER — SODIUM CHLORIDE 0.9 % IV SOLN
510.0000 mg | Freq: Once | INTRAVENOUS | Status: AC
  Administered 2017-02-27: 510 mg via INTRAVENOUS
  Filled 2017-02-27: qty 17

## 2017-02-27 NOTE — Patient Instructions (Signed)

## 2017-02-27 NOTE — Progress Notes (Signed)
[facility] OFFICE PROGRESS NOTE  Mand, [name], [name]:  This patient was last seen almost 3 years ago. He was initially referred to see me due to chronic leukocytosis which was thought to be related to prednisone therapy for rheumatoid arthritis. The patient is referred back to see me because of recent severe iron deficiency anemia. I reviewed his records extensively.  The patient is morbidly obese with diabetes and heart disease. He has paroxysmal atrial fibrillation and was initially treated with warfarin therapy and recently switched to Xarelto. In February 2018, he presented with signs and symptoms of GI bleed.  He received intravenous iron treatment.  He is referred here for further management The patient started to receive intermittent iron infusion in 2018 INTERVAL HISTORY: [name] 71 y.o. male returns for further follow-up. Apparently, his foot ulcer has healed He has stopped taking antibiotics recently When asked about infection, he said he had a skin boil in another location He denies dental issue No recent fever, chills or cough The patient have intermittent hemorrhoidal bleeding He denies recent epistaxis or hematuria. He complained of fatigue He takes anticoagulation therapy chronically for atrial fibrillation  I have reviewed the past medical history, past surgical history, social history and family history with the patient and they are unchanged from previous note.  ALLERGIES:  has No Known Allergies.  MEDICATIONS:  Current Outpatient Prescriptions  Medication Sig Dispense Refill  . albuterol (PROVENTIL HFA;VENTOLIN HFA) 108 (90 Base) MCG/ACT inhaler Inhale 2 puffs into the lungs every 4 (four) hours as needed for wheezing or shortness of breath.    Marland Kitchen amiodarone (PACERONE) 200 MG tablet TAKE 1 TABLET BY MOUTH  DAILY 90 tablet 1  . ANORO ELLIPTA 62.5-25 MCG/INH AEPB INHALE 1 PUFF INTO THE LUNGS DAILY. 60 each 4  .  atenolol (TENORMIN) 50 MG tablet Take 1 tablet (50 mg total) by mouth 2 (two) times daily. 180 tablet 3  . diltiazem (CARDIZEM CD) 180 MG 24 hr capsule TAKE 1 CAPSULE BY MOUTH  DAILY 90 capsule 1  . furosemide (LASIX) 40 MG tablet TAKE 1 TABLET BY MOUTH  DAILY 90 tablet 1  . hydrochlorothiazide (HYDRODIURIL) 25 MG tablet TAKE 1 TABLET BY MOUTH  DAILY 90 tablet 1  . hydrocortisone (ANUSOL-HC) 25 MG suppository Place 1 suppository (25 mg total) rectally 2 (two) times daily. 12 suppository 0  . metFORMIN (GLUCOPHAGE-XR) 500 MG 24 hr tablet Take 1 tablet by mouth 2 (two) times daily.    Marland Kitchen omeprazole (PRILOSEC) 20 MG capsule TAKE 1 CAPSULE BY MOUTH  DAILY 90 capsule 1  . polyethylene glycol (MIRALAX / GLYCOLAX) packet Take 17 g by mouth 2 (two) times daily. (Patient taking differently: Take 17 g by mouth 2 (two) times daily as needed for mild constipation. ) 14 each 0  . potassium chloride SA (K-DUR,KLOR-CON) 20 MEQ tablet TAKE ONE TABLET BY MOUTH TWICE A DAY 180 tablet 2  . PROAIR HFA 108 (90 Base) MCG/ACT inhaler INHALE TWO PUFFS BY MOUTH EVERY 4 HOURS AS NEEDED FOR WHEEZING AND  SHORTNESS OF BREATH 8.5 each 2  . spironolactone (ALDACTONE) 25 MG tablet TAKE 1 TABLET BY MOUTH  DAILY 90 tablet 1  . XARELTO 20 MG TABS tablet TAKE ONE TABLET BY MOUTH DAILY WITH SUPPER 90 tablet 1   No current facility-administered medications for this visit.    Facility-Administered Medications Ordered in Other Visits  Medication Dose Route Frequency Provider Last Rate Last Dose  .  0.9 %  sodium chloride infusion   Intravenous Once Bertis Ruddy, Brogen Duell, MD      . ferumoxytol (FERAHEME) 510 mg in sodium chloride 0.9 % 100 mL IVPB  510 mg Intravenous Once Bertis Ruddy, Clemon Devaul, MD         REVIEW OF SYSTEMS:   Constitutional: Denies fevers, chills or night sweats Eyes: Denies blurriness of vision Ears, nose, mouth, throat, and face: Denies mucositis or sore throat Respiratory: Denies cough, dyspnea or wheezes Cardiovascular: Denies  palpitation, chest discomfort or lower extremity swelling Gastrointestinal:  Denies nausea, heartburn or change in bowel habits Skin: Denies abnormal skin rashes Lymphatics: Denies new lymphadenopathy or easy bruising Neurological:Denies numbness, tingling or new weaknesses Behavioral/Psych: Mood is stable, no new changes  All other systems were reviewed with the patient and are negative.  PHYSICAL EXAMINATION: ECOG PERFORMANCE STATUS: 1 - Symptomatic but completely ambulatory  Vitals:   02/27/17 1028  BP: (!) 139/47  Pulse: 70  Resp: 18  Temp: 98.5 F (36.9 C)  SpO2: 96%   Filed Weights   02/27/17 1028  Weight: (!) 348 lb 4.8 oz (158 kg)    GENERAL:alert, no distress and comfortable.  He is morbidly obese SKIN: skin color, texture, turgor are normal, no rashes or significant lesions EYES: normal, Conjunctiva are pink and non-injected, sclera clear OROPHARYNX:no exudate, no erythema and lips, buccal mucosa, and tongue normal  NECK: supple, thyroid normal size, non-tender, without nodularity LYMPH:  no palpable lymphadenopathy in the cervical, axillary or inguinal LUNGS: clear to auscultation and percussion with normal breathing effort HEART: regular rate & rhythm and no murmurs and no lower extremity edema ABDOMEN:abdomen soft, non-tender and normal bowel sounds Musculoskeletal:no cyanosis of digits and no clubbing  NEURO: alert & oriented x 3 with fluent speech, no focal motor/sensory deficits  LABORATORY DATA:  I have reviewed the data as listed     Component Value Date/Time   NA 138 10/03/2016 1037   K 3.8 10/03/2016 1037   CL 103 10/03/2016 1037   CO2 25 10/03/2016 1037   GLUCOSE 169 (H) 10/03/2016 1037   BUN 18 10/03/2016 1037   CREATININE 1.32 (H) 02/16/2017 0800   CREATININE 1.17 10/03/2016 1037   CALCIUM 9.0 10/03/2016 1037   PROT 7.8 08/20/2016 2230   ALBUMIN 3.8 08/20/2016 2230   AST 18 08/20/2016 2230   ALT 14 (L) 08/20/2016 2230   ALKPHOS 58  08/20/2016 2230   BILITOT 0.7 08/20/2016 2230   GFRNONAA 53 (L) 02/16/2017 0800   GFRAA >60 02/16/2017 0800    No results found for: SPEP, UPEP  Lab Results  Component Value Date   WBC 19.6 (H) 02/20/2017   NEUTROABS 13.1 (H) 02/20/2017   HGB 10.9 (L) 02/20/2017   HCT 35.5 (L) 02/20/2017   MCV 80.2 02/20/2017   PLT 110 (L) 02/20/2017      Chemistry      Component Value Date/Time   NA 138 10/03/2016 1037   K 3.8 10/03/2016 1037   CL 103 10/03/2016 1037   CO2 25 10/03/2016 1037   BUN 18 10/03/2016 1037   CREATININE 1.32 (H) 02/16/2017 0800   CREATININE 1.17 10/03/2016 1037      Component Value Date/Time   CALCIUM 9.0 10/03/2016 1037   ALKPHOS 58 08/20/2016 2230   AST 18 08/20/2016 2230   ALT 14 (L) 08/20/2016 2230   BILITOT 0.7 08/20/2016 2230       ASSESSMENT & PLAN:  Leukocytosis He has acute on chronic leukocytosis He has recent  low-grade fever and evidence of nonhealing foot ulcer His foot ulcer apparently has healed but he has another skin infection elsewhere I will observe for now  Acute blood loss anemia The most likely cause of his anemia is due to chronic blood loss/malabsorption syndrome. We discussed some of the risks, benefits, and alternatives of intravenous iron infusions. The patient is symptomatic from anemia and the iron level is critically low. He tolerated oral iron supplement poorly and desires to achieved higher levels of iron faster for adequate hematopoesis. Some of the side-effects to be expected including risks of infusion reactions, phlebitis, headaches, nausea and fatigue.  The patient is willing to proceed. Patient education material was dispensed.  Goal is to keep ferritin level greater than 50 I plan to recheck blood work again in 3 months   Chronic anticoagulation He is taking chronic anticoagulation therapy due to paroxysmal atrial fibrillation. His cardiologist recommended this for long-term preventative strategy against  stroke. We will monitor his CBC and iron studies carefully while he is on chronic anticoagulation therapy.   Orders Placed This Encounter  Procedures  . Ferritin    Standing Status:   Future    Standing Expiration Date:   04/03/2018  . Iron and TIBC    Standing Status:   Future    Standing Expiration Date:   04/03/2018    All questions were answered. The patient knows to call the clinic with any problems, questions or concerns. No barriers to learning was detected.  I spent 15 minutes counseling the patient face to face. The total time spent in the appointment was 20 minutes and more than 50% was on counseling.     Artis Delay, MD 8/17/201811:43 AM

## 2017-02-27 NOTE — Assessment & Plan Note (Signed)
He is taking chronic anticoagulation therapy due to paroxysmal atrial fibrillation. His cardiologist recommended this for long-term preventative strategy against stroke. We will monitor his CBC and iron studies carefully while he is on chronic anticoagulation therapy. 

## 2017-02-27 NOTE — Assessment & Plan Note (Signed)
The most likely cause of his anemia is due to chronic blood loss/malabsorption syndrome. We discussed some of the risks, benefits, and alternatives of intravenous iron infusions. The patient is symptomatic from anemia and the iron level is critically low. He tolerated oral iron supplement poorly and desires to achieved higher levels of iron faster for adequate hematopoesis. Some of the side-effects to be expected including risks of infusion reactions, phlebitis, headaches, nausea and fatigue.  The patient is willing to proceed. Patient education material was dispensed.  Goal is to keep ferritin level greater than 50 I plan to recheck blood work again in 3 months  

## 2017-02-27 NOTE — Assessment & Plan Note (Addendum)
He has acute on chronic leukocytosis He has recent low-grade fever and evidence of nonhealing foot ulcer His foot ulcer apparently has healed but he has another skin infection elsewhere I will observe for now

## 2017-03-06 ENCOUNTER — Ambulatory Visit (HOSPITAL_BASED_OUTPATIENT_CLINIC_OR_DEPARTMENT_OTHER): Payer: Medicare Other

## 2017-03-06 VITALS — BP 129/49 | HR 59 | Temp 98.4°F | Resp 20

## 2017-03-06 DIAGNOSIS — D62 Acute posthemorrhagic anemia: Secondary | ICD-10-CM

## 2017-03-06 DIAGNOSIS — K625 Hemorrhage of anus and rectum: Secondary | ICD-10-CM | POA: Diagnosis not present

## 2017-03-06 DIAGNOSIS — D500 Iron deficiency anemia secondary to blood loss (chronic): Secondary | ICD-10-CM | POA: Diagnosis not present

## 2017-03-06 MED ORDER — SODIUM CHLORIDE 0.9 % IV SOLN
510.0000 mg | Freq: Once | INTRAVENOUS | Status: AC
  Administered 2017-03-06: 510 mg via INTRAVENOUS
  Filled 2017-03-06: qty 17

## 2017-03-06 MED ORDER — SODIUM CHLORIDE 0.9 % IV SOLN
Freq: Once | INTRAVENOUS | Status: AC
  Administered 2017-03-06: 12:00:00 via INTRAVENOUS

## 2017-03-06 NOTE — Patient Instructions (Signed)

## 2017-03-09 ENCOUNTER — Other Ambulatory Visit: Payer: Self-pay | Admitting: Cardiology

## 2017-03-22 ENCOUNTER — Other Ambulatory Visit: Payer: Self-pay | Admitting: Emergency Medicine

## 2017-04-16 ENCOUNTER — Ambulatory Visit (INDEPENDENT_AMBULATORY_CARE_PROVIDER_SITE_OTHER): Payer: Medicare Other | Admitting: Emergency Medicine

## 2017-04-16 ENCOUNTER — Encounter: Payer: Self-pay | Admitting: Emergency Medicine

## 2017-04-16 DIAGNOSIS — G4733 Obstructive sleep apnea (adult) (pediatric): Secondary | ICD-10-CM

## 2017-04-16 DIAGNOSIS — R0600 Dyspnea, unspecified: Secondary | ICD-10-CM

## 2017-04-16 DIAGNOSIS — J449 Chronic obstructive pulmonary disease, unspecified: Secondary | ICD-10-CM

## 2017-04-16 NOTE — Assessment & Plan Note (Signed)
Continue your BiPAP every night

## 2017-04-16 NOTE — Assessment & Plan Note (Signed)
Walking oximetry today on RA to r/o occult desaturation

## 2017-04-16 NOTE — Progress Notes (Signed)
  Subjective:    Patient ID: [name], male    DOB: [birth year], 71 y.o.   MRN: [id number]   HPI  ROV 04/16/17 -- Patient has a history of COPD and obstructive sleep apnea on CPAP. Also with rheumatoid arthritis, hypertension, atrial fibrillation on anticoagulation.  He has been rx for Fe deficiency anemia, likely hemorrhoids. He was treated with abx, unsure why or which one. He is on Anoro, tolerates. No flares since last time. He is using albuterol frequently every day. Unsure whether it helps him. Flu shot up to date. Has a lot of dry cough. Occasionally productive. He is on omeprazole. He remains on BiPAP, tolerates it well but has mouth leak  CAT score today 22.    Objective:   Physical Exam Vitals:   04/16/17 1155  BP: 134/72  Pulse: 60  SpO2: 94%  Weight: (!) 348 lb (157.9 kg)  Height: 6' 6.5" (1.994 m)   Gen: Pleasant, obese, in no distress,  normal affect  ENT: No lesions,  mouth clear,  oropharynx clear, no postnasal drip  Neck: No JVD, no TMG, no carotid bruits  Lungs: No use of accessory muscles, no wheeze on forced exp  Cardiovascular: RRR, systolic M, B compression stockings in place.   Musculoskeletal: No deformities, no cyanosis or clubbing  Neuro: alert, non focal  Skin: Warm, no lesions or rashes   Assessment & Plan:  COPD (chronic obstructive pulmonary disease) (HCC) Please continue your medications as you are taking them  Walking oximetry today on room air Follow with Dr Delton Coombes in 4 months or sooner if you have any problems.  Sleep apnea Continue your BiPAP every night  Dyspnea Walking oximetry today on RA to r/o occult desaturation  Ricky Pupa, MD, PhD 04/16/2017, 5:21 PM [facility] [phone] or if no answer [phone]

## 2017-04-16 NOTE — Assessment & Plan Note (Signed)
Please continue your medications as you are taking them  Walking oximetry today on room air Follow with Dr Delton Coombes in 4 months or sooner if you have any problems.

## 2017-04-16 NOTE — Patient Instructions (Signed)
Please continue your medications as you are taking them  Continue your BiPAP every night Walking oximetry today on room air Follow with Dr Delton Coombes in 4 months or sooner if you have any problems.

## 2017-06-12 ENCOUNTER — Other Ambulatory Visit (HOSPITAL_BASED_OUTPATIENT_CLINIC_OR_DEPARTMENT_OTHER): Payer: Medicare Other

## 2017-06-12 ENCOUNTER — Telehealth: Payer: Self-pay

## 2017-06-12 ENCOUNTER — Other Ambulatory Visit: Payer: Self-pay | Admitting: Hematology and Oncology

## 2017-06-12 DIAGNOSIS — D500 Iron deficiency anemia secondary to blood loss (chronic): Secondary | ICD-10-CM | POA: Diagnosis not present

## 2017-06-12 DIAGNOSIS — K625 Hemorrhage of anus and rectum: Secondary | ICD-10-CM

## 2017-06-12 DIAGNOSIS — D62 Acute posthemorrhagic anemia: Secondary | ICD-10-CM

## 2017-06-12 LAB — CBC & DIFF AND RETIC
BASO%: 1.8 % (ref 0.0–2.0)
BASOS ABS: 0.3 10*3/uL — AB (ref 0.0–0.1)
EOS%: 3.1 % (ref 0.0–7.0)
Eosinophils Absolute: 0.6 10*3/uL — ABNORMAL HIGH (ref 0.0–0.5)
HEMATOCRIT: 39.3 % (ref 38.4–49.9)
HEMOGLOBIN: 12.4 g/dL — AB (ref 13.0–17.1)
Immature Retic Fract: 27.9 % — ABNORMAL HIGH (ref 3.00–10.60)
LYMPH%: 18.2 % (ref 14.0–49.0)
MCH: 28.1 pg (ref 27.2–33.4)
MCHC: 31.6 g/dL — AB (ref 32.0–36.0)
MCV: 88.9 fL (ref 79.3–98.0)
MONO#: 1.9 10*3/uL — ABNORMAL HIGH (ref 0.1–0.9)
MONO%: 10.5 % (ref 0.0–14.0)
NEUT#: 11.9 10*3/uL — ABNORMAL HIGH (ref 1.5–6.5)
NEUT%: 66.4 % (ref 39.0–75.0)
PLATELETS: 124 10*3/uL — AB (ref 140–400)
RBC: 4.42 10*6/uL (ref 4.20–5.82)
RDW: 18.3 % — ABNORMAL HIGH (ref 11.0–14.6)
RETIC CT ABS: 135.25 10*3/uL — AB (ref 34.80–93.90)
Retic %: 3.06 % — ABNORMAL HIGH (ref 0.80–1.80)
WBC: 17.9 10*3/uL — ABNORMAL HIGH (ref 4.0–10.3)
lymph#: 3.3 10*3/uL (ref 0.9–3.3)
nRBC: 1 % — ABNORMAL HIGH (ref 0–0)

## 2017-06-12 LAB — IRON AND TIBC
%SAT: 10 % — AB (ref 20–55)
IRON: 41 ug/dL — AB (ref 42–163)
TIBC: 413 ug/dL — AB (ref 202–409)
UIBC: 371 ug/dL (ref 117–376)

## 2017-06-12 LAB — TECHNOLOGIST REVIEW: Technologist Review: 1

## 2017-06-12 LAB — FERRITIN: FERRITIN: 38 ng/mL (ref 22–316)

## 2017-06-12 NOTE — Telephone Encounter (Signed)
Called and left below message. Ask him to call nurse back regarding appt.

## 2017-06-12 NOTE — Telephone Encounter (Signed)
-----   Message from Artis Delay, MD sent at 06/12/2017 12:28 PM EST ----- Regarding: next week iron Please remind him to keep appt next Friday to see me and IV iron. He will need a second dose, not scheduled. Can you verify he can also come the following week? And if so, please place scheduling msg to add second dose IV iron on 12/14

## 2017-06-15 NOTE — Telephone Encounter (Signed)
Called back, he will take the 12/14 appt. Scheduling message sent.

## 2017-06-15 NOTE — Telephone Encounter (Signed)
Left another message.

## 2017-06-18 ENCOUNTER — Telehealth: Payer: Self-pay | Admitting: Hematology and Oncology

## 2017-06-18 NOTE — Telephone Encounter (Signed)
Left message for patient re 12/14 appointment. Also confirmed 12/7. Patient to get updated schedule at 12/7 visit.

## 2017-06-19 ENCOUNTER — Ambulatory Visit (HOSPITAL_BASED_OUTPATIENT_CLINIC_OR_DEPARTMENT_OTHER): Payer: Medicare Other

## 2017-06-19 ENCOUNTER — Telehealth: Payer: Self-pay | Admitting: Hematology and Oncology

## 2017-06-19 ENCOUNTER — Encounter: Payer: Self-pay | Admitting: Hematology and Oncology

## 2017-06-19 ENCOUNTER — Ambulatory Visit: Payer: Medicare Other | Admitting: Hematology and Oncology

## 2017-06-19 VITALS — BP 135/56 | HR 65 | Temp 98.0°F | Resp 18

## 2017-06-19 DIAGNOSIS — D509 Iron deficiency anemia, unspecified: Secondary | ICD-10-CM

## 2017-06-19 DIAGNOSIS — L97511 Non-pressure chronic ulcer of other part of right foot limited to breakdown of skin: Secondary | ICD-10-CM

## 2017-06-19 DIAGNOSIS — D72825 Bandemia: Secondary | ICD-10-CM

## 2017-06-19 DIAGNOSIS — D62 Acute posthemorrhagic anemia: Secondary | ICD-10-CM

## 2017-06-19 DIAGNOSIS — L97521 Non-pressure chronic ulcer of other part of left foot limited to breakdown of skin: Secondary | ICD-10-CM

## 2017-06-19 DIAGNOSIS — D72829 Elevated white blood cell count, unspecified: Secondary | ICD-10-CM | POA: Diagnosis not present

## 2017-06-19 DIAGNOSIS — L97519 Non-pressure chronic ulcer of other part of right foot with unspecified severity: Secondary | ICD-10-CM | POA: Insufficient documentation

## 2017-06-19 DIAGNOSIS — K625 Hemorrhage of anus and rectum: Secondary | ICD-10-CM

## 2017-06-19 MED ORDER — CEPHALEXIN 500 MG PO CAPS: 500.0000 mg | ORAL_CAPSULE | Freq: Three times a day (TID) | ORAL | 0 refills | Status: DC

## 2017-06-19 MED ORDER — SODIUM CHLORIDE 0.9 % IV SOLN
510.0000 mg | Freq: Once | INTRAVENOUS | Status: AC
  Administered 2017-06-19: 510 mg via INTRAVENOUS
  Filled 2017-06-19: qty 17

## 2017-06-19 NOTE — Assessment & Plan Note (Signed)
He has chronic left foot infection I took pictures He needs aggressive care I will start him on antibiotic therapy and refer him to wound care center for further management I encouraged him to set up follow-up appointment with his primary care doctor

## 2017-06-19 NOTE — Assessment & Plan Note (Signed)
I took several pictures It looks like chronic nonhealing foot ulcer I will prescribe antibiotics and encourage him to see his primary care doctor and wound care center for further management

## 2017-06-19 NOTE — Patient Instructions (Signed)

## 2017-06-19 NOTE — Telephone Encounter (Signed)
Scheduled appt per 12/7 los - Gave patient AVS and calender per los.  

## 2017-06-19 NOTE — Progress Notes (Signed)
[name] OFFICE PROGRESS NOTE  Mand, Ricky Revel, [name]:  This patient was last seen almost 3 years ago. He was initially referred to see me due to chronic leukocytosis which was thought to be related to prednisone therapy for rheumatoid arthritis. The patient is referred back to see me because of recent severe iron deficiency anemia. I reviewed his records extensively.  The patient is morbidly obese with diabetes and heart disease. He has paroxysmal atrial fibrillation and was initially treated with warfarin therapy and recently switched to Xarelto. In February 2018, he presented with signs and symptoms of GI bleed.  He received intravenous iron treatment.  He is referred here for further management The patient started to receive intermittent iron infusion in 2018 INTERVAL HISTORY: [name] 71 y.o. male returns for further follow-up He complained of fatigue He continues to have hemorrhoidal bleeding every 3 days He remains on chronic anticoagulation therapy He had surgery to treat hemorrhoids in the past and he declined repeat surgery He is not able to keep up with oral iron supplement He complained of chronic nonhealing left foot ulcer for over a year It is foul-smelling with discharge He denies fever or chills  I have reviewed the past medical history, past surgical history, social history and family history with the patient and they are unchanged from previous note.  ALLERGIES:  has No Known Allergies.  MEDICATIONS:  Current Outpatient Medications  Medication Sig Dispense Refill  . albuterol (PROVENTIL HFA;VENTOLIN HFA) 108 (90 Base) MCG/ACT inhaler Inhale 2 puffs into the lungs every 4 (four) hours as needed for wheezing or shortness of breath.    Marland Kitchen amiodarone (PACERONE) 200 MG tablet TAKE 1 TABLET BY MOUTH  DAILY 90 tablet 1  . ANORO ELLIPTA 62.5-25 MCG/INH AEPB INHALE 1 PUFF INTO THE LUNGS DAILY. 60 each 3  . atenolol  (TENORMIN) 50 MG tablet Take 1 tablet (50 mg total) by mouth 2 (two) times daily. 180 tablet 3  . cephALEXin (KEFLEX) 500 MG capsule Take 1 capsule (500 mg total) by mouth 3 (three) times daily. 30 capsule 0  . diltiazem (CARDIZEM CD) 180 MG 24 hr capsule TAKE 1 CAPSULE BY MOUTH  DAILY 90 capsule 1  . Dulaglutide (TRULICITY) 0.75 MG/0.5ML SOPN Inject 0.75 mg into the skin once a week.    . furosemide (LASIX) 40 MG tablet TAKE 1 TABLET BY MOUTH  DAILY 90 tablet 1  . hydrochlorothiazide (HYDRODIURIL) 25 MG tablet TAKE 1 TABLET BY MOUTH  DAILY 90 tablet 1  . hydrocortisone (ANUSOL-HC) 25 MG suppository Place 1 suppository (25 mg total) rectally 2 (two) times daily. 12 suppository 0  . metFORMIN (GLUCOPHAGE-XR) 500 MG 24 hr tablet Take 1 tablet by mouth 2 (two) times daily.    Marland Kitchen omeprazole (PRILOSEC) 20 MG capsule TAKE 1 CAPSULE BY MOUTH  DAILY 90 capsule 1  . polyethylene glycol (MIRALAX / GLYCOLAX) packet Take 17 g by mouth 2 (two) times daily. (Patient taking differently: Take 17 g by mouth 2 (two) times daily as needed for mild constipation. ) 14 each 0  . potassium chloride SA (K-DUR,KLOR-CON) 20 MEQ tablet TAKE ONE TABLET BY MOUTH TWICE A DAY 180 tablet 2  . PROAIR HFA 108 (90 Base) MCG/ACT inhaler INHALE TWO PUFFS BY MOUTH EVERY 4 HOURS AS NEEDED FOR WHEEZING AND  SHORTNESS OF BREATH 8.5 each 2  . spironolactone (ALDACTONE) 25 MG tablet TAKE 1 TABLET BY MOUTH  DAILY 90 tablet 1  .  XARELTO 20 MG TABS tablet TAKE ONE TABLET BY MOUTH DAILY WITH SUPPER 90 tablet 1   No current facility-administered medications for this visit.      REVIEW OF SYSTEMS:   Constitutional: Denies fevers, chills or night sweats Eyes: Denies blurriness of vision Ears, nose, mouth, throat, and face: Denies mucositis or sore throat Respiratory: Denies cough, dyspnea or wheezes Cardiovascular: Denies palpitation, chest discomfort or lower extremity swelling Gastrointestinal:  Denies nausea, heartburn or change in bowel  habits Skin: Denies abnormal skin rashes Lymphatics: Denies new lymphadenopathy or easy bruising Neurological:Denies numbness, tingling or new weaknesses Behavioral/Psych: Mood is stable, no new changes  All other systems were reviewed with the patient and are negative.  PHYSICAL EXAMINATION: ECOG PERFORMANCE STATUS: 1 - Symptomatic but completely ambulatory  Vitals:   06/19/17 1028  BP: (!) 150/57  Pulse: 74  Resp: (!) 24  Temp: 98.1 F (36.7 C)  SpO2: 94%   Filed Weights   06/19/17 1028  Weight: (!) 352 lb 9.6 oz (159.9 kg)    GENERAL:alert, no distress and comfortable.  He is morbidly obese SKIN: Noted left foot ulcer EYES: normal, Conjunctiva are pink and non-injected, sclera clear Musculoskeletal:no cyanosis of digits and no clubbing  NEURO: alert & oriented x 3 with fluent speech, no focal motor/sensory deficits  LABORATORY DATA:  I have reviewed the data as listed     Component Value Date/Time   NA 138 10/03/2016 1037   K 3.8 10/03/2016 1037   CL 103 10/03/2016 1037   CO2 25 10/03/2016 1037   GLUCOSE 169 (H) 10/03/2016 1037   BUN 18 10/03/2016 1037   CREATININE 1.32 (H) 02/16/2017 0800   CREATININE 1.17 10/03/2016 1037   CALCIUM 9.0 10/03/2016 1037   PROT 7.8 08/20/2016 2230   ALBUMIN 3.8 08/20/2016 2230   AST 18 08/20/2016 2230   ALT 14 (L) 08/20/2016 2230   ALKPHOS 58 08/20/2016 2230   BILITOT 0.7 08/20/2016 2230   GFRNONAA 53 (L) 02/16/2017 0800   GFRAA >60 02/16/2017 0800    No results found for: SPEP, UPEP  Lab Results  Component Value Date   WBC 17.9 (H) 06/12/2017   NEUTROABS 11.9 (H) 06/12/2017   HGB 12.4 (L) 06/12/2017   HCT 39.3 06/12/2017   MCV 88.9 06/12/2017   PLT 124 (L) 06/12/2017      Chemistry      Component Value Date/Time   NA 138 10/03/2016 1037   K 3.8 10/03/2016 1037   CL 103 10/03/2016 1037   CO2 25 10/03/2016 1037   BUN 18 10/03/2016 1037   CREATININE 1.32 (H) 02/16/2017 0800   CREATININE 1.17 10/03/2016 1037       Component Value Date/Time   CALCIUM 9.0 10/03/2016 1037   ALKPHOS 58 08/20/2016 2230   AST 18 08/20/2016 2230   ALT 14 (L) 08/20/2016 2230   BILITOT 0.7 08/20/2016 2230           ASSESSMENT & PLAN:  Acute blood loss anemia The most likely cause of his anemia is due to chronic blood loss/malabsorption syndrome. We discussed some of the risks, benefits, and alternatives of intravenous iron infusions. The patient is symptomatic from anemia and the iron level is critically low. He tolerated oral iron supplement poorly and desires to achieved higher levels of iron faster for adequate hematopoesis. Some of the side-effects to be expected including risks of infusion reactions, phlebitis, headaches, nausea and fatigue.  The patient is willing to proceed. Patient education material was dispensed.  Goal is to keep ferritin level greater than 50 I plan to recheck blood work again in 3 months   Leukocytosis He has chronic left foot infection I took pictures He needs aggressive care I will start him on antibiotic therapy and refer him to wound care [name] for further management I encouraged him to set up follow-up appointment with his primary care doctor  Chronic ulcer of left foot (HCC) I took several pictures It looks like chronic nonhealing foot ulcer I will prescribe antibiotics and encourage him to see his primary care doctor and wound care [name] for further management   Orders Placed This Encounter  Procedures  . AMB referral to wound care [name]    Referral Priority:   Routine    Referral Type:   Consultation    Number of Visits Requested:   1    All questions were answered. The patient knows to call the clinic with any problems, questions or concerns. No barriers to learning was detected.  I spent 15 minutes counseling the patient face to face. The total time spent in the appointment was 20 minutes and more than 50% was on counseling.     Artis Delay, MD 12/7/201810:42  AM

## 2017-06-19 NOTE — Assessment & Plan Note (Signed)
The most likely cause of his anemia is due to chronic blood loss/malabsorption syndrome. We discussed some of the risks, benefits, and alternatives of intravenous iron infusions. The patient is symptomatic from anemia and the iron level is critically low. He tolerated oral iron supplement poorly and desires to achieved higher levels of iron faster for adequate hematopoesis. Some of the side-effects to be expected including risks of infusion reactions, phlebitis, headaches, nausea and fatigue.  The patient is willing to proceed. Patient education material was dispensed.  Goal is to keep ferritin level greater than 50 I plan to recheck blood work again in 3 months

## 2017-06-26 ENCOUNTER — Ambulatory Visit (HOSPITAL_BASED_OUTPATIENT_CLINIC_OR_DEPARTMENT_OTHER): Payer: Medicare Other

## 2017-06-26 VITALS — BP 137/50 | HR 58 | Temp 97.8°F | Resp 18

## 2017-06-26 DIAGNOSIS — K625 Hemorrhage of anus and rectum: Secondary | ICD-10-CM | POA: Diagnosis not present

## 2017-06-26 DIAGNOSIS — D500 Iron deficiency anemia secondary to blood loss (chronic): Secondary | ICD-10-CM

## 2017-06-26 DIAGNOSIS — D62 Acute posthemorrhagic anemia: Secondary | ICD-10-CM

## 2017-06-26 MED ORDER — SODIUM CHLORIDE 0.9 % IV SOLN
510.0000 mg | Freq: Once | INTRAVENOUS | Status: AC
  Administered 2017-06-26: 510 mg via INTRAVENOUS
  Filled 2017-06-26: qty 17

## 2017-06-26 NOTE — Patient Instructions (Signed)

## 2017-07-21 ENCOUNTER — Encounter (HOSPITAL_COMMUNITY): Payer: Self-pay | Admitting: Emergency Medicine

## 2017-07-21 ENCOUNTER — Emergency Department (HOSPITAL_COMMUNITY)
Admission: EM | Admit: 2017-07-21 | Discharge: 2017-07-21 | Disposition: A | Discharge status: Home / Self Care | Payer: Medicare Other | Source: Home / Self Care

## 2017-07-21 DIAGNOSIS — D72829 Elevated white blood cell count, unspecified: Secondary | ICD-10-CM

## 2017-07-21 DIAGNOSIS — E1169 Type 2 diabetes mellitus with other specified complication: Secondary | ICD-10-CM | POA: Diagnosis not present

## 2017-07-21 DIAGNOSIS — Z5321 Procedure and treatment not carried out due to patient leaving prior to being seen by health care provider: Secondary | ICD-10-CM

## 2017-07-21 LAB — CBC WITH DIFFERENTIAL/PLATELET
Basophils Absolute: 0.3 10*3/uL — ABNORMAL HIGH (ref 0.0–0.1)
Basophils Relative: 1 %
Eosinophils Absolute: 0.5 10*3/uL (ref 0.0–0.7)
Eosinophils Relative: 2 %
HCT: 39.2 % (ref 39.0–52.0)
Hemoglobin: 12.4 g/dL — ABNORMAL LOW (ref 13.0–17.0)
Lymphocytes Relative: 12 %
Lymphs Abs: 3.2 10*3/uL (ref 0.7–4.0)
MCH: 28.2 pg (ref 26.0–34.0)
MCHC: 31.6 g/dL (ref 30.0–36.0)
MCV: 89.3 fL (ref 78.0–100.0)
Monocytes Absolute: 2.7 10*3/uL — ABNORMAL HIGH (ref 0.1–1.0)
Monocytes Relative: 10 %
Neutro Abs: 20.2 10*3/uL — ABNORMAL HIGH (ref 1.7–7.7)
Neutrophils Relative %: 75 %
Platelets: 125 10*3/uL — ABNORMAL LOW (ref 150–400)
RBC: 4.39 MIL/uL (ref 4.22–5.81)
RDW: 18.5 % — ABNORMAL HIGH (ref 11.5–15.5)
WBC: 26.9 10*3/uL — ABNORMAL HIGH (ref 4.0–10.5)

## 2017-07-21 LAB — COMPREHENSIVE METABOLIC PANEL
ALT: 16 U/L — ABNORMAL LOW (ref 17–63)
AST: 20 U/L (ref 15–41)
Albumin: 3.8 g/dL (ref 3.5–5.0)
Alkaline Phosphatase: 68 U/L (ref 38–126)
Anion gap: 10 (ref 5–15)
BUN: 21 mg/dL — ABNORMAL HIGH (ref 6–20)
CO2: 26 mmol/L (ref 22–32)
Calcium: 9.5 mg/dL (ref 8.9–10.3)
Chloride: 98 mmol/L — ABNORMAL LOW (ref 101–111)
Creatinine, Ser: 1.4 mg/dL — ABNORMAL HIGH (ref 0.61–1.24)
GFR calc Af Amer: 57 mL/min — ABNORMAL LOW (ref >60)
GFR calc non Af Amer: 49 mL/min — ABNORMAL LOW (ref >60)
Glucose, Bld: 198 mg/dL — ABNORMAL HIGH (ref 65–99)
Potassium: 3.6 mmol/L (ref 3.5–5.1)
Sodium: 134 mmol/L — ABNORMAL LOW (ref 135–145)
Total Bilirubin: 1.2 mg/dL (ref 0.3–1.2)
Total Protein: 8.5 g/dL — ABNORMAL HIGH (ref 6.5–8.1)

## 2017-07-21 LAB — I-STAT CG4 LACTIC ACID, ED: Lactic Acid, Venous: 1.9 mmol/L (ref 0.5–1.9)

## 2017-07-21 NOTE — ED Notes (Signed)
Patient gave labels to registration stating they are no longer waiting.

## 2017-07-21 NOTE — ED Triage Notes (Signed)
Patient reports that his infection on right big toe and saw PCP today and was called stating that need to go to ED due to WBC 27

## 2017-07-22 ENCOUNTER — Encounter (HOSPITAL_BASED_OUTPATIENT_CLINIC_OR_DEPARTMENT_OTHER): Payer: Medicare Other

## 2017-07-22 ENCOUNTER — Inpatient Hospital Stay (HOSPITAL_COMMUNITY)
Admission: EM | Admit: 2017-07-22 | Discharge: 2017-07-25 | DRG: 617 | Disposition: A | Discharge status: Home / Self Care | Payer: Medicare Other | Attending: Internal Medicine | Admitting: Internal Medicine

## 2017-07-22 ENCOUNTER — Emergency Department (HOSPITAL_COMMUNITY): Payer: Medicare Other

## 2017-07-22 ENCOUNTER — Encounter (HOSPITAL_COMMUNITY): Payer: Self-pay | Admitting: *Deleted

## 2017-07-22 DIAGNOSIS — I480 Paroxysmal atrial fibrillation: Secondary | ICD-10-CM | POA: Diagnosis present

## 2017-07-22 DIAGNOSIS — Z6841 Body Mass Index (BMI) 40.0 and over, adult: Secondary | ICD-10-CM

## 2017-07-22 DIAGNOSIS — Z9989 Dependence on other enabling machines and devices: Secondary | ICD-10-CM

## 2017-07-22 DIAGNOSIS — M869 Osteomyelitis, unspecified: Secondary | ICD-10-CM | POA: Diagnosis not present

## 2017-07-22 DIAGNOSIS — K219 Gastro-esophageal reflux disease without esophagitis: Secondary | ICD-10-CM | POA: Diagnosis present

## 2017-07-22 DIAGNOSIS — M86171 Other acute osteomyelitis, right ankle and foot: Secondary | ICD-10-CM | POA: Diagnosis not present

## 2017-07-22 DIAGNOSIS — L97509 Non-pressure chronic ulcer of other part of unspecified foot with unspecified severity: Secondary | ICD-10-CM

## 2017-07-22 DIAGNOSIS — E11621 Type 2 diabetes mellitus with foot ulcer: Secondary | ICD-10-CM | POA: Diagnosis present

## 2017-07-22 DIAGNOSIS — M868X7 Other osteomyelitis, ankle and foot: Secondary | ICD-10-CM | POA: Diagnosis present

## 2017-07-22 DIAGNOSIS — I129 Hypertensive chronic kidney disease with stage 1 through stage 4 chronic kidney disease, or unspecified chronic kidney disease: Secondary | ICD-10-CM | POA: Diagnosis present

## 2017-07-22 DIAGNOSIS — N183 Chronic kidney disease, stage 3 (moderate): Secondary | ICD-10-CM | POA: Diagnosis present

## 2017-07-22 DIAGNOSIS — L97519 Non-pressure chronic ulcer of other part of right foot with unspecified severity: Secondary | ICD-10-CM | POA: Diagnosis present

## 2017-07-22 DIAGNOSIS — Z7901 Long term (current) use of anticoagulants: Secondary | ICD-10-CM | POA: Diagnosis not present

## 2017-07-22 DIAGNOSIS — I872 Venous insufficiency (chronic) (peripheral): Secondary | ICD-10-CM | POA: Diagnosis present

## 2017-07-22 DIAGNOSIS — R21 Rash and other nonspecific skin eruption: Secondary | ICD-10-CM | POA: Diagnosis not present

## 2017-07-22 DIAGNOSIS — E1151 Type 2 diabetes mellitus with diabetic peripheral angiopathy without gangrene: Secondary | ICD-10-CM | POA: Diagnosis present

## 2017-07-22 DIAGNOSIS — M069 Rheumatoid arthritis, unspecified: Secondary | ICD-10-CM | POA: Diagnosis present

## 2017-07-22 DIAGNOSIS — E1122 Type 2 diabetes mellitus with diabetic chronic kidney disease: Secondary | ICD-10-CM | POA: Diagnosis present

## 2017-07-22 DIAGNOSIS — E119 Type 2 diabetes mellitus without complications: Secondary | ICD-10-CM

## 2017-07-22 DIAGNOSIS — Z87891 Personal history of nicotine dependence: Secondary | ICD-10-CM

## 2017-07-22 DIAGNOSIS — E1169 Type 2 diabetes mellitus with other specified complication: Principal | ICD-10-CM | POA: Diagnosis present

## 2017-07-22 DIAGNOSIS — Z7984 Long term (current) use of oral hypoglycemic drugs: Secondary | ICD-10-CM | POA: Diagnosis not present

## 2017-07-22 DIAGNOSIS — I10 Essential (primary) hypertension: Secondary | ICD-10-CM | POA: Diagnosis not present

## 2017-07-22 DIAGNOSIS — G4733 Obstructive sleep apnea (adult) (pediatric): Secondary | ICD-10-CM | POA: Diagnosis present

## 2017-07-22 DIAGNOSIS — I4891 Unspecified atrial fibrillation: Secondary | ICD-10-CM | POA: Diagnosis not present

## 2017-07-22 LAB — COMPREHENSIVE METABOLIC PANEL
ALT: 15 U/L — ABNORMAL LOW (ref 17–63)
ANION GAP: 12 (ref 5–15)
AST: 21 U/L (ref 15–41)
Albumin: 3.8 g/dL (ref 3.5–5.0)
Alkaline Phosphatase: 71 U/L (ref 38–126)
BUN: 22 mg/dL — AB (ref 6–20)
CHLORIDE: 99 mmol/L — AB (ref 101–111)
CO2: 25 mmol/L (ref 22–32)
Calcium: 9.5 mg/dL (ref 8.9–10.3)
Creatinine, Ser: 1.54 mg/dL — ABNORMAL HIGH (ref 0.61–1.24)
GFR calc Af Amer: 51 mL/min — ABNORMAL LOW (ref >60)
GFR, EST NON AFRICAN AMERICAN: 44 mL/min — AB (ref >60)
Glucose, Bld: 176 mg/dL — ABNORMAL HIGH (ref 65–99)
POTASSIUM: 3.6 mmol/L (ref 3.5–5.1)
Sodium: 136 mmol/L (ref 135–145)
TOTAL PROTEIN: 8.5 g/dL — AB (ref 6.5–8.1)
Total Bilirubin: 1.4 mg/dL — ABNORMAL HIGH (ref 0.3–1.2)

## 2017-07-22 LAB — CBC WITH DIFFERENTIAL/PLATELET
BASOS ABS: 0.2 10*3/uL — AB (ref 0.0–0.1)
Basophils Relative: 1 %
EOS ABS: 0.7 10*3/uL (ref 0.0–0.7)
Eosinophils Relative: 3 %
HCT: 39.2 % (ref 39.0–52.0)
HEMOGLOBIN: 12.7 g/dL — AB (ref 13.0–17.0)
LYMPHS PCT: 14 %
Lymphs Abs: 3.3 10*3/uL (ref 0.7–4.0)
MCH: 28.9 pg (ref 26.0–34.0)
MCHC: 32.4 g/dL (ref 30.0–36.0)
MCV: 89.3 fL (ref 78.0–100.0)
Monocytes Absolute: 2.6 10*3/uL — ABNORMAL HIGH (ref 0.1–1.0)
Monocytes Relative: 11 %
NEUTROS ABS: 17.1 10*3/uL — AB (ref 1.7–7.7)
NEUTROS PCT: 71 %
PLATELETS: 103 10*3/uL — AB (ref 150–400)
RBC: 4.39 MIL/uL (ref 4.22–5.81)
RDW: 18.5 % — ABNORMAL HIGH (ref 11.5–15.5)
WBC: 23.9 10*3/uL — ABNORMAL HIGH (ref 4.0–10.5)

## 2017-07-22 LAB — PROTIME-INR
INR: 1.35
PROTHROMBIN TIME: 16.6 s — AB (ref 11.4–15.2)

## 2017-07-22 MED ORDER — METRONIDAZOLE 500 MG PO TABS
500.0000 mg | ORAL_TABLET | Freq: Three times a day (TID) | ORAL | Status: DC
  Administered 2017-07-23 – 2017-07-25 (×7): 500 mg via ORAL
  Filled 2017-07-22 (×7): qty 1

## 2017-07-22 MED ORDER — PANTOPRAZOLE SODIUM 40 MG PO TBEC
40.0000 mg | DELAYED_RELEASE_TABLET | Freq: Every day | ORAL | Status: DC
  Administered 2017-07-23 – 2017-07-25 (×3): 40 mg via ORAL
  Filled 2017-07-22 (×3): qty 1

## 2017-07-22 MED ORDER — DILTIAZEM HCL ER COATED BEADS 180 MG PO CP24
180.0000 mg | ORAL_CAPSULE | Freq: Every day | ORAL | Status: DC
  Administered 2017-07-23 – 2017-07-25 (×3): 180 mg via ORAL
  Filled 2017-07-22 (×3): qty 1

## 2017-07-22 MED ORDER — PIPERACILLIN-TAZOBACTAM 3.375 G IVPB 30 MIN
3.3750 g | Freq: Once | INTRAVENOUS | Status: AC
  Administered 2017-07-22: 3.375 g via INTRAVENOUS
  Filled 2017-07-22: qty 50

## 2017-07-22 MED ORDER — POTASSIUM CHLORIDE CRYS ER 20 MEQ PO TBCR
20.0000 meq | EXTENDED_RELEASE_TABLET | Freq: Two times a day (BID) | ORAL | Status: DC
  Administered 2017-07-23 – 2017-07-25 (×4): 20 meq via ORAL
  Filled 2017-07-22 (×4): qty 1

## 2017-07-22 MED ORDER — UMECLIDINIUM-VILANTEROL 62.5-25 MCG/INH IN AEPB
1.0000 | INHALATION_SPRAY | Freq: Every day | RESPIRATORY_TRACT | Status: DC
  Administered 2017-07-23 – 2017-07-25 (×3): 1 via RESPIRATORY_TRACT
  Filled 2017-07-22: qty 14

## 2017-07-22 MED ORDER — FUROSEMIDE 40 MG PO TABS
40.0000 mg | ORAL_TABLET | Freq: Every day | ORAL | Status: DC
  Administered 2017-07-23 – 2017-07-25 (×3): 40 mg via ORAL
  Filled 2017-07-22 (×3): qty 1

## 2017-07-22 MED ORDER — ONDANSETRON HCL 4 MG/2ML IJ SOLN: 4.0000 mg | Freq: Four times a day (QID) | INTRAMUSCULAR | Status: DC | PRN

## 2017-07-22 MED ORDER — INSULIN ASPART 100 UNIT/ML ~~LOC~~ SOLN
0.0000 [IU] | Freq: Three times a day (TID) | SUBCUTANEOUS | Status: DC
  Administered 2017-07-23 (×3): 5 [IU] via SUBCUTANEOUS
  Administered 2017-07-24 (×2): 3 [IU] via SUBCUTANEOUS
  Administered 2017-07-24 – 2017-07-25 (×3): 5 [IU] via SUBCUTANEOUS

## 2017-07-22 MED ORDER — ONDANSETRON HCL 4 MG PO TABS: 4.0000 mg | ORAL_TABLET | Freq: Four times a day (QID) | ORAL | Status: DC | PRN

## 2017-07-22 MED ORDER — AMIODARONE HCL 200 MG PO TABS
200.0000 mg | ORAL_TABLET | Freq: Every day | ORAL | Status: DC
  Administered 2017-07-23 – 2017-07-25 (×3): 200 mg via ORAL
  Filled 2017-07-22 (×3): qty 1

## 2017-07-22 MED ORDER — VANCOMYCIN HCL IN DEXTROSE 1-5 GM/200ML-% IV SOLN
1000.0000 mg | Freq: Once | INTRAVENOUS | Status: AC
  Administered 2017-07-23: 02:00:00 1000 mg via INTRAVENOUS

## 2017-07-22 MED ORDER — ALBUTEROL SULFATE HFA 108 (90 BASE) MCG/ACT IN AERS: 2.0000 | INHALATION_SPRAY | RESPIRATORY_TRACT | Status: DC | PRN

## 2017-07-22 MED ORDER — HYDROCHLOROTHIAZIDE 25 MG PO TABS
25.0000 mg | ORAL_TABLET | Freq: Every day | ORAL | Status: DC
  Administered 2017-07-23: 10:00:00 25 mg via ORAL
  Filled 2017-07-22 (×2): qty 1

## 2017-07-22 MED ORDER — MORPHINE SULFATE (PF) 4 MG/ML IV SOLN
4.0000 mg | Freq: Once | INTRAVENOUS | Status: AC
  Administered 2017-07-22: 4 mg via INTRAVENOUS
  Filled 2017-07-22: qty 1

## 2017-07-22 MED ORDER — VANCOMYCIN HCL IN DEXTROSE 1-5 GM/200ML-% IV SOLN
1000.0000 mg | Freq: Once | INTRAVENOUS | Status: AC
  Administered 2017-07-22: 1000 mg via INTRAVENOUS
  Filled 2017-07-22: qty 200

## 2017-07-22 MED ORDER — ATENOLOL 50 MG PO TABS
50.0000 mg | ORAL_TABLET | Freq: Two times a day (BID) | ORAL | Status: DC
  Administered 2017-07-23: 50 mg via ORAL
  Filled 2017-07-22 (×3): qty 1

## 2017-07-22 MED ORDER — ACETAMINOPHEN 325 MG PO TABS
650.0000 mg | ORAL_TABLET | Freq: Four times a day (QID) | ORAL | Status: DC | PRN
  Administered 2017-07-23 (×2): 650 mg via ORAL
  Filled 2017-07-22 (×2): qty 2

## 2017-07-22 MED ORDER — SPIRONOLACTONE 25 MG PO TABS
25.0000 mg | ORAL_TABLET | Freq: Every day | ORAL | Status: DC
  Administered 2017-07-23: 10:00:00 25 mg via ORAL
  Filled 2017-07-22 (×2): qty 1

## 2017-07-22 MED ORDER — ACETAMINOPHEN 650 MG RE SUPP: 650.0000 mg | Freq: Four times a day (QID) | RECTAL | Status: DC | PRN

## 2017-07-22 MED ORDER — DEXTROSE 5 % IV SOLN
2.0000 g | Freq: Every day | INTRAVENOUS | Status: DC
  Administered 2017-07-23 – 2017-07-24 (×2): 2 g via INTRAVENOUS
  Filled 2017-07-22 (×3): qty 2

## 2017-07-22 MED ORDER — ALBUTEROL SULFATE (2.5 MG/3ML) 0.083% IN NEBU: 2.5000 mg | INHALATION_SOLUTION | RESPIRATORY_TRACT | Status: DC | PRN

## 2017-07-22 NOTE — ED Triage Notes (Signed)
Pt sent by PCP for right big toe infection and WBC 27.

## 2017-07-22 NOTE — ED Notes (Signed)
Bed: WA09 Expected date:  Expected time:  Means of arrival:  Comments: [name]

## 2017-07-22 NOTE — H&P (Addendum)
History and Physical    [name] VVO:[id number] DOB: 1946/04/03 DOA: 07/22/2017  PCP: Ricky Din, PA-C  Patient coming from: Home  I have personally briefly reviewed patient's old medical records in [facility]  Chief Complaint: Toe ulcer  HPI: [name] is a 72 y.o. male with medical history significant of DM2, HTN, PAF on Xeralto.  Patient presents to the ED with c/o R great toe infection.  Worsening redness, swelling to R great toe with ulcer.  Onset 1 month ago, worsening over past 2 weeks.  Seen by PCP yesterday, labs drawn doxy ordered which he hasnt started yet.  Sent in to ED after lab work showed WBC 27k.   ED Course: WBC 24k.  X ray suggestive of osteomyelitis.  Given zosyn and vanc.   Review of Systems: As per HPI otherwise 10 point review of systems negative.   Past Medical History:  Diagnosis Date  . Chronic anticoagulation   . Chronic back pain   . Chronic chest pain   . Chronic venous insufficiency   . Diabetes mellitus without complication (HCC)   . Emphysema   . HTN (hypertension)   . Obesity   . PAF (paroxysmal atrial fibrillation) (HCC)   . Pneumothorax   . Rheumatoid arthritis(714.0)     Past Surgical History:  Procedure Laterality Date  . ablation    . CARDIAC CATHETERIZATION  1998   NORMAL  . COLONOSCOPY WITH PROPOFOL N/A 08/25/2016   Procedure: COLONOSCOPY WITH PROPOFOL;  Surgeon: Carman Ching, MD;  Location: [facility];  Service: Endoscopy;  Laterality: N/A;  . HERNIA REPAIR    . LEFT HEART CATH AND CORONARY ANGIOGRAPHY N/A 10/17/2016   Procedure: Left Heart Cath and Coronary Angiography;  Surgeon: Peter M Swaziland, MD;  Location: [facility] INVASIVE CV LAB;  Service: Cardiovascular;  Laterality: N/A;  . PLEURAL SCARIFICATION    . VEIN LIGATION AND STRIPPING       reports that he quit smoking about 21 years ago. His smoking use included cigarettes. He has a 60.00 pack-year smoking history. he has never used smokeless tobacco. He reports  that he drinks alcohol. He reports that he does not use drugs.  No Known Allergies  Family History  Problem Relation Age of Onset  . Other Father        CEREBRAL THROMBOSIS  . Rheum arthritis Mother   . Bone cancer Mother   . Heart failure Brother 50  . Coronary artery disease Brother 55       PTCA  . ALS Sister   . Rheum arthritis Brother   . Rheum arthritis Brother      Prior to Admission medications   Medication Sig Start Date End Date Taking? Authorizing Provider  acetaminophen (TYLENOL) 500 MG tablet Take 1,000-1,500 mg by mouth every 6 (six) hours as needed.   Yes [provider]  amiodarone (PACERONE) 200 MG tablet TAKE 1 TABLET BY MOUTH  DAILY 03/11/17  Yes [place], Peter M, MD  ANORO ELLIPTA 62.5-25 MCG/INH AEPB INHALE 1 PUFF INTO THE LUNGS DAILY. 03/23/17  Yes Leslye Peer, MD  atenolol (TENORMIN) 50 MG tablet Take 1 tablet (50 mg total) by mouth 2 (two) times daily. 09/04/16 08/30/17 Yes [place], Peter M, MD  Chlorpheniramine-Acetaminophen (CORICIDIN HBP COLD/FLU PO) Take 1-2 tablets by mouth every 6 (six) hours as needed.   Yes [provider]  diltiazem (CARDIZEM CD) 180 MG 24 hr capsule TAKE 1 CAPSULE BY MOUTH  DAILY 03/11/17  Yes [place], Peter M, MD  doxycycline (VIBRA-TABS) 100 MG tablet Take 100 mg by mouth 2 (two) times daily. 07/21/17  Yes [provider]  furosemide (LASIX) 40 MG tablet TAKE 1 TABLET BY MOUTH  DAILY 03/11/17  Yes [place], Peter M, MD  hydrochlorothiazide (HYDRODIURIL) 25 MG tablet TAKE 1 TABLET BY MOUTH  DAILY 03/11/17  Yes [place], Peter M, MD  metFORMIN (GLUCOPHAGE-XR) 500 MG 24 hr tablet Take 1 tablet by mouth 2 (two) times daily. 10/06/16  Yes [provider]  omeprazole (PRILOSEC) 20 MG capsule TAKE 1 CAPSULE BY MOUTH  DAILY 03/11/17  Yes [place], Peter M, MD  potassium chloride SA (K-DUR,KLOR-CON) 20 MEQ tablet TAKE ONE TABLET BY MOUTH TWICE A DAY 10/28/16  Yes [place], Peter M, MD  PROAIR HFA 108 [phone] Base) MCG/ACT  inhaler INHALE TWO PUFFS BY MOUTH EVERY 4 HOURS AS NEEDED FOR WHEEZING AND  SHORTNESS OF BREATH 02/23/17  Yes Leslye Peer, MD  spironolactone (ALDACTONE) 25 MG tablet TAKE 1 TABLET BY MOUTH  DAILY 03/11/17  Yes [place], Peter M, MD  VICTOZA 18 MG/3ML SOPN Inject 0.6-1.2 mg into the skin daily. Inject 0.6 mg [place] daily for 1 week then increase to 1.2 mg [place] daily 07/22/17  Yes [provider]  XARELTO 20 MG TABS tablet TAKE ONE TABLET BY MOUTH DAILY WITH SUPPER 02/25/17  Yes [place], Peter M, MD    Physical Exam: Vitals:   07/22/17 1915 07/22/17 2003 07/22/17 2045 07/22/17 2130  BP: (!) 147/55 (!) 142/44 (!) 146/41 (!) 152/50  Pulse: 68 67 65 65  Resp:  20  16  SpO2: 96% 99% 97% 96%    Constitutional: NAD, calm, comfortable Eyes: PERRL, lids and conjunctivae normal ENMT: Mucous membranes are moist. Posterior pharynx clear of any exudate or lesions.Normal dentition.  Neck: normal, supple, no masses, no thyromegaly Respiratory: clear to auscultation bilaterally, no wheezing, no crackles. Normal respiratory effort. No accessory muscle use.  Cardiovascular: Regular rate and rhythm, no murmurs / rubs / gallops. No extremity edema. 2+ pedal pulses. No carotid bruits.  Abdomen: no tenderness, no masses palpated. No hepatosplenomegaly. Bowel sounds positive.  Musculoskeletal: no clubbing / cyanosis. No joint deformity upper and lower extremities. Good ROM, no contractures. Normal muscle tone.  Skin:        Neurologic: CN 2-12 grossly intact. Sensation intact, DTR normal. Strength 5/5 in all 4.  Psychiatric: Normal judgment and insight. Alert and oriented x 3. Normal mood.    Labs on Admission: I have personally reviewed following labs and imaging studies  CBC: Recent Labs  Lab 07/21/17 1759 07/22/17 2003  WBC 26.9* 23.9*  NEUTROABS 20.2* 17.1*  HGB 12.4* 12.7*  HCT 39.2 39.2  MCV 89.3 89.3  PLT 125* 103*   Basic Metabolic Panel: Recent Labs  Lab 07/21/17 1759  07/22/17 2003  NA 134* 136  K 3.6 3.6  CL 98* 99*  CO2 26 25  GLUCOSE 198* 176*  BUN 21* 22*  CREATININE 1.40* 1.54*  CALCIUM 9.5 9.5   GFR: Estimated Creatinine Clearance: 74.1 mL/min (A) (by C-G formula based on SCr of 1.54 mg/dL (H)). Liver Function Tests: Recent Labs  Lab 07/21/17 1759 07/22/17 2003  AST 20 21  ALT 16* 15*  ALKPHOS 68 71  BILITOT 1.2 1.4*  PROT 8.5* 8.5*  ALBUMIN 3.8 3.8   No results for input(s): LIPASE, AMYLASE in the last 168 hours. No results for input(s): AMMONIA in the last 168 hours. Coagulation Profile: Recent Labs  Lab 07/22/17 2003  INR 1.35   Cardiac Enzymes: No results for input(s): CKTOTAL, CKMB, CKMBINDEX, TROPONINI in the last 168 hours. BNP (last 3 results) No results for input(s): PROBNP in the last 8760 hours. HbA1C: No results for input(s): HGBA1C in the last 72 hours. CBG: No results for input(s): GLUCAP in the last 168 hours. Lipid Profile: No results for input(s): CHOL, HDL, LDLCALC, TRIG, CHOLHDL, LDLDIRECT in the last 72 hours. Thyroid Function Tests: No results for input(s): TSH, T4TOTAL, FREET4, T3FREE, THYROIDAB in the last 72 hours. Anemia Panel: No results for input(s): VITAMINB12, FOLATE, FERRITIN, TIBC, IRON, RETICCTPCT in the last 72 hours. Urine analysis:    Component Value Date/Time   COLORURINE YELLOW 08/23/2016 0847   APPEARANCEUR CLEAR 08/23/2016 0847   LABSPEC 1.015 08/23/2016 0847   PHURINE 8.0 08/23/2016 0847   GLUCOSEU NEGATIVE 08/23/2016 0847   HGBUR NEGATIVE 08/23/2016 0847   BILIRUBINUR NEGATIVE 08/23/2016 0847   KETONESUR NEGATIVE 08/23/2016 0847   PROTEINUR NEGATIVE 08/23/2016 0847   NITRITE NEGATIVE 08/23/2016 0847   LEUKOCYTESUR TRACE (A) 08/23/2016 0847    Radiological Exams on Admission: Dg Foot 2 Views Right  Result Date: 07/22/2017 CLINICAL DATA:  Infection and pain to the right big toe. EXAM: RIGHT FOOT - 2 VIEW COMPARISON:  None. FINDINGS: There is a fragmentation of the distal  aspect of the distal first right phalanx, with overlying soft tissue swelling. Findings are concerning for osteomyelitis. No evidence of soft tissue emphysema. Diffuse soft tissue swelling of the dorsum of the midfoot. IMPRESSION: Fragmentation of the distal aspect of the distal first right phalanx, concerning for osteomyelitis. Electronically Signed   By: Ted Mcalpine M.D.   On: 07/22/2017 20:04    EKG: Independently reviewed.  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 Infected diabetic foot ulcer with probable Osteomyelitis of toe of R great toe - 1. Foot ulcer pathway 2. Rocephin, flagyl, vanc 3. Dr. Ophelia Charter consulted 1. Wants MRI of toe to confirm osteo 2. Ortho will see in AM 3. Sounds like patient has seen Lajoyce Corners in past for ulcer on L toe which ultimately healed. 2. HTN - 1. Continue home meds 3. DM2 - 1. Hold metformin and victoza which he hasnt started yet anyhow 2. A1C pending 3. Mod scale SSI AC 4. Diab coordinator consult as per foot ulcer pathway 4. PAF - 1. Continue xarelto 2. Continue amiodarone PO 3. Continue cardizem PO 5. OSA - continue CPAP  DVT prophylaxis: Xarelto for now Code Status: Full Family Communication: Wife at bedside Disposition Plan: Home after admit Consults called: Dr. Ophelia Charter Admission status: Admit to inpatient - inpatient status for treatment of limb threatening infecion   [name]. DO [facility] Pager [phone]  If 7AM-7PM, please contact day team taking care of patient [email or web address] Password [facility]  07/22/2017, 10:54 PM

## 2017-07-22 NOTE — Progress Notes (Signed)
A consult was received from an ED physician for vancomycin and zosyn per pharmacy dosing.  The patient's profile has been reviewed for ht/wt/allergies/indication/available labs.   A one time order has been placed for vancomycin 1g and zosyn 3.375g.  Further antibiotics/pharmacy consults should be ordered by admitting physician if indicated.                       Thank you, Clance Boll 07/22/2017  7:44 PM

## 2017-07-22 NOTE — ED Provider Notes (Signed)
[facility]-EMERGENCY DEPT Provider Note   CSN: [id number] Arrival date & time: 07/22/17  1401     History   Chief Complaint Chief Complaint  Patient presents with  . Wound Infection    HPI [name] is a 72 y.o. male with a history of DM Type II with peripheral neuropathy, Afib on Eliquis, chronic venous insufficiency, COPD, and rheumatoid arthritis who presents to the emergency department with a chief complaint of constant, worsening right great toe infection.  The patient reports worsening redness, swelling to the right great toe with purulent drainage that began one month ago that has significantly worsened over the last 2 weeks.  He was seen yesterday by his PCP had labs drawn and was started on doxycycline and referred to the wound clinic. His labs showed a leukocytosis of 27 so he came to the ED, but left after waiting for several hours. He followed up earlier today with the wound clinic then represented to the ED. He denies fevers or chills.  He has neuropathy in the foot and denies pain at this time. No aggravating or alleviating factors.  The history is provided by the patient and the spouse. No language interpreter was used.    Past Medical History:  Diagnosis Date  . Chronic anticoagulation   . Chronic back pain   . Chronic chest pain   . Chronic venous insufficiency   . Diabetes mellitus without complication (HCC)   . Emphysema   . HTN (hypertension)   . Obesity   . PAF (paroxysmal atrial fibrillation) (HCC)   . Pneumothorax   . Rheumatoid arthritis(714.0)     Patient Active Problem List   Diagnosis Date Noted  . Diabetic ulcer of right great toe (HCC) 07/22/2017  . Osteomyelitis of toe of right foot (HCC) 07/22/2017  . Foot ulcer, right (HCC) 06/19/2017  . Leukocytosis 09/26/2016  . Chronic ulcer of left foot (HCC) 09/26/2016  . Abnormal nuclear stress test 09/04/2016  . Exertional angina (HCC)   . Acute blood loss anemia 08/21/2016  .  BRBPR (bright red blood per rectum) 08/21/2016  . HTN (hypertension) 08/21/2016  . DM2 (diabetes mellitus, type 2) (HCC) 08/21/2016  . Epistaxis 06/12/2016  . Neck pain on left side 06/12/2016  . Chronic venous insufficiency 10/01/2015  . Respiratory distress 09/19/2013  . GERD (gastroesophageal reflux disease) 09/05/2013  . Pain in limb 11/30/2012  . Varicose veins of lower extremities with other complications 11/30/2012  . COPD (chronic obstructive pulmonary disease) (HCC) 12/12/2010  . OSA (obstructive sleep apnea) 12/12/2010  . Rheumatoid arthritis(714.0) 12/12/2010  . Intercostal pain 10/04/2010  . High risk medications (not anticoagulants) long-term use 10/04/2010  . Dyspnea 10/04/2010  . PAF (paroxysmal atrial fibrillation) (HCC)   . Chronic anticoagulation   . Chronic chest pain   . Obesity     Past Surgical History:  Procedure Laterality Date  . ablation    . CARDIAC CATHETERIZATION  1998   NORMAL  . COLONOSCOPY WITH PROPOFOL N/A 08/25/2016   Procedure: COLONOSCOPY WITH PROPOFOL;  Surgeon: Carman Ching, MD;  Location: [facility];  Service: Endoscopy;  Laterality: N/A;  . HERNIA REPAIR    . LEFT HEART CATH AND CORONARY ANGIOGRAPHY N/A 10/17/2016   Procedure: Left Heart Cath and Coronary Angiography;  Surgeon: Peter M Swaziland, MD;  Location: [facility] INVASIVE CV LAB;  Service: Cardiovascular;  Laterality: N/A;  . PLEURAL SCARIFICATION    . VEIN LIGATION AND STRIPPING  Home Medications    Prior to Admission medications   Medication Sig Start Date End Date Taking? Authorizing Provider  acetaminophen (TYLENOL) 500 MG tablet Take 1,000-1,500 mg by mouth every 6 (six) hours as needed.   Yes [provider]  amiodarone (PACERONE) 200 MG tablet TAKE 1 TABLET BY MOUTH  DAILY 03/11/17  Yes [place], Peter M, MD  ANORO ELLIPTA 62.5-25 MCG/INH AEPB INHALE 1 PUFF INTO THE LUNGS DAILY. 03/23/17  Yes Leslye Peer, MD  atenolol (TENORMIN) 50 MG tablet Take 1 tablet (50  mg total) by mouth 2 (two) times daily. 09/04/16 08/30/17 Yes [place], Peter M, MD  Chlorpheniramine-Acetaminophen (CORICIDIN HBP COLD/FLU PO) Take 1-2 tablets by mouth every 6 (six) hours as needed.   Yes [provider]  diltiazem (CARDIZEM CD) 180 MG 24 hr capsule TAKE 1 CAPSULE BY MOUTH  DAILY 03/11/17  Yes [place], Peter M, MD  doxycycline (VIBRA-TABS) 100 MG tablet Take 100 mg by mouth 2 (two) times daily. 07/21/17  Yes [provider]  furosemide (LASIX) 40 MG tablet TAKE 1 TABLET BY MOUTH  DAILY 03/11/17  Yes [place], Peter M, MD  hydrochlorothiazide (HYDRODIURIL) 25 MG tablet TAKE 1 TABLET BY MOUTH  DAILY 03/11/17  Yes [place], Peter M, MD  metFORMIN (GLUCOPHAGE-XR) 500 MG 24 hr tablet Take 1 tablet by mouth 2 (two) times daily. 10/06/16  Yes [provider]  omeprazole (PRILOSEC) 20 MG capsule TAKE 1 CAPSULE BY MOUTH  DAILY 03/11/17  Yes [place], Peter M, MD  potassium chloride SA (K-DUR,KLOR-CON) 20 MEQ tablet TAKE ONE TABLET BY MOUTH TWICE A DAY 10/28/16  Yes [place], Peter M, MD  PROAIR HFA 108 [phone] Base) MCG/ACT inhaler INHALE TWO PUFFS BY MOUTH EVERY 4 HOURS AS NEEDED FOR WHEEZING AND  SHORTNESS OF BREATH 02/23/17  Yes Leslye Peer, MD  spironolactone (ALDACTONE) 25 MG tablet TAKE 1 TABLET BY MOUTH  DAILY 03/11/17  Yes [place], Peter M, MD  VICTOZA 18 MG/3ML SOPN Inject 0.6-1.2 mg into the skin daily. Inject 0.6 mg [place] daily for 1 week then increase to 1.2 mg [place] daily 07/22/17  Yes [provider]  XARELTO 20 MG TABS tablet TAKE ONE TABLET BY MOUTH DAILY WITH SUPPER 02/25/17  Yes [place], Peter M, MD    Family History Family History  Problem Relation Age of Onset  . Other Father        CEREBRAL THROMBOSIS  . Rheum arthritis Mother   . Bone cancer Mother   . Heart failure Brother 50  . Coronary artery disease Brother 37       PTCA  . ALS Sister   . Rheum arthritis Brother   . Rheum arthritis Brother     Social History Social History   Tobacco Use  .  Smoking status: Former Smoker    Packs/day: 2.00    Years: 30.00    Pack years: 60.00    Types: Cigarettes    Last attempt to quit: 03/14/1996    Years since quitting: 21.3  . Smokeless tobacco: Never Used  Substance Use Topics  . Alcohol use: Yes    Comment: one beer per month  . Drug use: No     Allergies   Patient has no known allergies.   Review of Systems Review of Systems  Constitutional: Negative for activity change, chills and fever.  HENT: Negative for congestion.   Eyes: Negative for visual disturbance.  Respiratory: Negative for shortness of breath.   Cardiovascular: Negative for chest pain.  Gastrointestinal: Negative for abdominal pain, diarrhea, nausea and vomiting.  Genitourinary: Negative for dysuria.  Musculoskeletal: Negative for back pain.  Skin: Positive for color change and wound. Negative for rash.  Allergic/Immunologic: Positive for immunocompromised state.  Hematological: Bruises/bleeds easily.  Psychiatric/Behavioral: Negative for confusion.   Physical Exam Updated Vital Signs BP (!) 152/50   Pulse 65   Resp 16   SpO2 96%   Physical Exam  Constitutional: He appears well-developed.  HENT:  Head: Normocephalic.  Eyes: Conjunctivae are normal.  Neck: Neck supple.  Cardiovascular: Normal rate, regular rhythm, normal heart sounds and intact distal pulses. Exam reveals no gallop and no friction rub.  No murmur heard. Pulmonary/Chest: Effort normal and breath sounds normal. No stridor. No respiratory distress. He has no wheezes. He has no rales. He exhibits no tenderness.  Abdominal: Soft. Bowel sounds are normal. He exhibits no distension. There is no tenderness.  Musculoskeletal:  1+ DP and PT pulses.  Decreased sensation to the bilateral feet, patient's baseline.  Purulent discharge is expressed from an open circular wound at the distal tip of the right great toe.  The digit is erythematous and warm.  Second toe also appears erythematous, but is  not warm to the touch.  Good perfusion to the distal tip.  Chronic lymphedema noted to the bilateral lower extremities however the right lower extremity is warm to the touch and appears acutely erythematous. See additional pictures below.  Neurological: He is alert.  Skin: Skin is warm and dry.  Psychiatric: His behavior is normal.  Nursing note and vitals reviewed.          ED Treatments / Results  Labs (all labs ordered are listed, but only abnormal results are displayed) Labs Reviewed  CBC WITH DIFFERENTIAL/PLATELET - Abnormal; Notable for the following components:      Result Value   WBC 23.9 (*)    Hemoglobin 12.7 (*)    RDW 18.5 (*)    Platelets 103 (*)    Neutro Abs 17.1 (*)    Monocytes Absolute 2.6 (*)    Basophils Absolute 0.2 (*)    All other components within normal limits  PROTIME-INR - Abnormal; Notable for the following components:   Prothrombin Time 16.6 (*)    All other components within normal limits  COMPREHENSIVE METABOLIC PANEL - Abnormal; Notable for the following components:   Chloride 99 (*)    Glucose, Bld 176 (*)    BUN 22 (*)    Creatinine, Ser 1.54 (*)    Total Protein 8.5 (*)    ALT 15 (*)    Total Bilirubin 1.4 (*)    GFR calc non Af Amer 44 (*)    GFR calc Af Amer 51 (*)    All other components within normal limits  CULTURE, BLOOD (ROUTINE X 2)  CULTURE, BLOOD (ROUTINE X 2)  CBC  BASIC METABOLIC PANEL  HEMOGLOBIN A1C  HIV ANTIBODY (ROUTINE TESTING)  SEDIMENTATION RATE  C-REACTIVE PROTEIN  PREALBUMIN    EKG  EKG Interpretation None       Radiology Dg Foot 2 Views Right  Result Date: 07/22/2017 CLINICAL DATA:  Infection and pain to the right big toe. EXAM: RIGHT FOOT - 2 VIEW COMPARISON:  None. FINDINGS: There is a fragmentation of the distal aspect of the distal first right phalanx, with overlying soft tissue swelling. Findings are concerning for osteomyelitis. No evidence of soft tissue emphysema. Diffuse soft tissue  swelling of the dorsum of the midfoot. IMPRESSION: Fragmentation of  the distal aspect of the distal first right phalanx, concerning for osteomyelitis. Electronically Signed   By: Ted Mcalpine M.D.   On: 07/22/2017 20:04    Procedures Procedures (including critical care time)  Medications Ordered in ED Medications  acetaminophen (TYLENOL) tablet 650 mg (not administered)    Or  acetaminophen (TYLENOL) suppository 650 mg (not administered)  ondansetron (ZOFRAN) tablet 4 mg (not administered)    Or  ondansetron (ZOFRAN) injection 4 mg (not administered)  cefTRIAXone (ROCEPHIN) 2 g in dextrose 5 % 50 mL IVPB (not administered)  metroNIDAZOLE (FLAGYL) tablet 500 mg (not administered)  amiodarone (PACERONE) tablet 200 mg (not administered)  umeclidinium-vilanterol (ANORO ELLIPTA) 62.5-25 MCG/INH 1 puff (not administered)  atenolol (TENORMIN) tablet 50 mg (not administered)  diltiazem (CARDIZEM CD) 24 hr capsule 180 mg (not administered)  furosemide (LASIX) tablet 40 mg (not administered)  hydrochlorothiazide (HYDRODIURIL) tablet 25 mg (not administered)  spironolactone (ALDACTONE) tablet 25 mg (not administered)  pantoprazole (PROTONIX) EC tablet 40 mg (not administered)  potassium chloride SA (K-DUR,KLOR-CON) CR tablet 20 mEq (not administered)  vancomycin (VANCOCIN) IVPB 1000 mg/200 mL premix (not administered)  albuterol (PROVENTIL) (2.5 MG/3ML) 0.083% nebulizer solution 2.5 mg (not administered)  insulin aspart (novoLOG) injection 0-15 Units (not administered)  vancomycin (VANCOCIN) IVPB 1000 mg/200 mL premix (0 mg Intravenous Stopped 07/22/17 2307)  piperacillin-tazobactam (ZOSYN) IVPB 3.375 g (0 g Intravenous Stopped 07/22/17 2118)  morphine 4 MG/ML injection 4 mg (4 mg Intravenous Given 07/22/17 2050)     Initial Impression / Assessment and Plan / ED Course  I have reviewed the triage vital signs and the nursing notes.  Pertinent labs & imaging results that were available  during my care of the patient were reviewed by me and considered in my medical decision making (see chart for details).     72 year old male with a history of DM Type II, and Afib on Eliqus presenting with a wound to the right great toe, worsening over the last month.  No constitutional symptoms; patient is afebrile without tachycardia.  Leukocytosis at PCP office yesterday of 27, leukocytosis today of 24.  X-ray of the right foot demonstrating fragmentation of the distal aspect of the distal first right phalanx, concerning for osteomyelitis.  Vancomycin and Zosyn initiated in the ED.  Controlled with morphine.  Spoke with Dr. Ophelia Charter, orthopedic surgery, who will see the patient tomorrow morning and recommended an MRI of the great toe which has been ordered.  Consulted the hospitalist team and spoke with Dr. Julian Reil who will admit the patient for continued evaluation and workup for diabetic ulcer of the great right toe with possible osteomyelitis.  She was seen and evaluated with Dr. Corlis Leak, attending physician. The patient appears reasonably stabilized for admission considering the current resources, flow, and capabilities available in the ED at this time, and I doubt any other [facility] requiring further screening and/or treatment in the ED prior to admission.  Final Clinical Impressions(s) / ED Diagnoses   Final diagnoses:  Diabetic ulcer of right great toe [facility])    ED Discharge Orders    None       Barkley Boards, PA-C 07/22/17 2326    Abelino Derrick, MD 07/22/17 2338

## 2017-07-23 ENCOUNTER — Inpatient Hospital Stay (HOSPITAL_COMMUNITY): Payer: Medicare Other

## 2017-07-23 ENCOUNTER — Other Ambulatory Visit: Payer: Self-pay

## 2017-07-23 ENCOUNTER — Inpatient Hospital Stay (HOSPITAL_COMMUNITY): Payer: Medicare Other | Admitting: Anesthesiology

## 2017-07-23 ENCOUNTER — Encounter (HOSPITAL_COMMUNITY)
Admission: EM | Disposition: A | Discharge status: Home / Self Care | Payer: Self-pay | Source: Home / Self Care | Attending: Internal Medicine

## 2017-07-23 DIAGNOSIS — L97519 Non-pressure chronic ulcer of other part of right foot with unspecified severity: Secondary | ICD-10-CM

## 2017-07-23 DIAGNOSIS — M86171 Other acute osteomyelitis, right ankle and foot: Secondary | ICD-10-CM

## 2017-07-23 DIAGNOSIS — G4733 Obstructive sleep apnea (adult) (pediatric): Secondary | ICD-10-CM

## 2017-07-23 DIAGNOSIS — E11621 Type 2 diabetes mellitus with foot ulcer: Secondary | ICD-10-CM

## 2017-07-23 HISTORY — PX: AMPUTATION: SHX166

## 2017-07-23 LAB — GLUCOSE, CAPILLARY
GLUCOSE-CAPILLARY: 215 mg/dL — AB (ref 65–99)
GLUCOSE-CAPILLARY: 201 mg/dL — AB (ref 65–99)
GLUCOSE-CAPILLARY: 166 mg/dL — AB (ref 65–99)
Glucose-Capillary: 216 mg/dL — ABNORMAL HIGH (ref 65–99)
Glucose-Capillary: 179 mg/dL — ABNORMAL HIGH (ref 65–99)

## 2017-07-23 LAB — SURGICAL PCR SCREEN
MRSA, PCR: NEGATIVE
STAPHYLOCOCCUS AUREUS: NEGATIVE

## 2017-07-23 LAB — APTT: aPTT: 34 seconds (ref 24–36)

## 2017-07-23 LAB — BASIC METABOLIC PANEL
ANION GAP: 8 (ref 5–15)
BUN: 21 mg/dL — ABNORMAL HIGH (ref 6–20)
CO2: 26 mmol/L (ref 22–32)
Calcium: 9 mg/dL (ref 8.9–10.3)
Chloride: 100 mmol/L — ABNORMAL LOW (ref 101–111)
Creatinine, Ser: 1.48 mg/dL — ABNORMAL HIGH (ref 0.61–1.24)
GFR, EST AFRICAN AMERICAN: 53 mL/min — AB (ref >60)
GFR, EST NON AFRICAN AMERICAN: 46 mL/min — AB (ref >60)
GLUCOSE: 209 mg/dL — AB (ref 65–99)
POTASSIUM: 3.3 mmol/L — AB (ref 3.5–5.1)
Sodium: 134 mmol/L — ABNORMAL LOW (ref 135–145)

## 2017-07-23 LAB — HEPARIN LEVEL (UNFRACTIONATED): HEPARIN UNFRACTIONATED: 0.36 [IU]/mL (ref 0.30–0.70)

## 2017-07-23 LAB — CBC
HEMATOCRIT: 36.2 % — AB (ref 39.0–52.0)
Hemoglobin: 11.6 g/dL — ABNORMAL LOW (ref 13.0–17.0)
MCH: 28.6 pg (ref 26.0–34.0)
MCHC: 32 g/dL (ref 30.0–36.0)
MCV: 89.4 fL (ref 78.0–100.0)
PLATELETS: 96 10*3/uL — AB (ref 150–400)
RBC: 4.05 MIL/uL — AB (ref 4.22–5.81)
RDW: 18.6 % — ABNORMAL HIGH (ref 11.5–15.5)
WBC: 22.4 10*3/uL — AB (ref 4.0–10.5)

## 2017-07-23 LAB — PREALBUMIN: Prealbumin: 15.9 mg/dL — ABNORMAL LOW (ref 18–38)

## 2017-07-23 LAB — HEMOGLOBIN A1C
Hgb A1c MFr Bld: 7.6 % — ABNORMAL HIGH (ref 4.8–5.6)
MEAN PLASMA GLUCOSE: 171.42 mg/dL

## 2017-07-23 LAB — C-REACTIVE PROTEIN: CRP: 9.5 mg/dL — ABNORMAL HIGH (ref <1.0)

## 2017-07-23 LAB — SEDIMENTATION RATE: Sed Rate: 48 mm/hr — ABNORMAL HIGH (ref 0–16)

## 2017-07-23 LAB — HIV ANTIBODY (ROUTINE TESTING W REFLEX): HIV SCREEN 4TH GENERATION: NONREACTIVE

## 2017-07-23 SURGERY — AMPUTATION DIGIT
Anesthesia: General | Laterality: Right

## 2017-07-23 MED ORDER — CHLORHEXIDINE GLUCONATE 4 % EX LIQD: 60.0000 mL | Freq: Once | CUTANEOUS | Status: DC

## 2017-07-23 MED ORDER — GADOBENATE DIMEGLUMINE 529 MG/ML IV SOLN
20.0000 mL | Freq: Once | INTRAVENOUS | Status: AC | PRN
  Administered 2017-07-23: 12:00:00 20 mL via INTRAVENOUS

## 2017-07-23 MED ORDER — JUVEN PO PACK
1.0000 | PACK | Freq: Two times a day (BID) | ORAL | Status: DC
  Administered 2017-07-24 – 2017-07-25 (×3): 1 via ORAL
  Filled 2017-07-23 (×5): qty 1

## 2017-07-23 MED ORDER — OXYCODONE-ACETAMINOPHEN 5-325 MG PO TABS
1.0000 | ORAL_TABLET | ORAL | Status: DC | PRN
  Administered 2017-07-24 (×3): 1 via ORAL
  Administered 2017-07-25 (×2): 2 via ORAL
  Filled 2017-07-23: qty 2
  Filled 2017-07-23 (×2): qty 1
  Filled 2017-07-23: qty 2
  Filled 2017-07-23: qty 1

## 2017-07-23 MED ORDER — FENTANYL CITRATE (PF) 100 MCG/2ML IJ SOLN: 25.0000 ug | INTRAMUSCULAR | Status: DC | PRN

## 2017-07-23 MED ORDER — FENTANYL CITRATE (PF) 100 MCG/2ML IJ SOLN
INTRAMUSCULAR | Status: AC
  Filled 2017-07-23: qty 2

## 2017-07-23 MED ORDER — CAMPHOR-MENTHOL 0.5-0.5 % EX LOTN
TOPICAL_LOTION | CUTANEOUS | Status: DC | PRN
  Administered 2017-07-24: 1 via TOPICAL
  Filled 2017-07-23: qty 222

## 2017-07-23 MED ORDER — PROPOFOL 10 MG/ML IV BOLUS
INTRAVENOUS | Status: DC | PRN
  Administered 2017-07-23: 300 mg via INTRAVENOUS

## 2017-07-23 MED ORDER — LACTATED RINGERS IV SOLN
INTRAVENOUS | Status: DC | PRN
  Administered 2017-07-23: 21:00:00 via INTRAVENOUS

## 2017-07-23 MED ORDER — PROPOFOL 10 MG/ML IV BOLUS
INTRAVENOUS | Status: AC
  Filled 2017-07-23: qty 40

## 2017-07-23 MED ORDER — INSULIN DETEMIR 100 UNIT/ML ~~LOC~~ SOLN
10.0000 [IU] | Freq: Every day | SUBCUTANEOUS | Status: DC
  Administered 2017-07-24: 22:00:00 10 [IU] via SUBCUTANEOUS
  Filled 2017-07-23 (×3): qty 0.1

## 2017-07-23 MED ORDER — LIDOCAINE 2% (20 MG/ML) 5 ML SYRINGE
INTRAMUSCULAR | Status: AC
Start: 2017-07-23 — End: 2017-07-23
  Filled 2017-07-23: qty 5

## 2017-07-23 MED ORDER — LIDOCAINE 2% (20 MG/ML) 5 ML SYRINGE
INTRAMUSCULAR | Status: DC | PRN
  Administered 2017-07-23: 100 mg via INTRAVENOUS

## 2017-07-23 MED ORDER — LIP MEDEX EX OINT
TOPICAL_OINTMENT | CUTANEOUS | Status: AC
  Administered 2017-07-23: 1
  Filled 2017-07-23: qty 7

## 2017-07-23 MED ORDER — ONDANSETRON HCL 4 MG/2ML IJ SOLN
INTRAMUSCULAR | Status: AC
  Filled 2017-07-23: qty 2

## 2017-07-23 MED ORDER — VANCOMYCIN HCL 10 G IV SOLR
1250.0000 mg | Freq: Two times a day (BID) | INTRAVENOUS | Status: DC
  Administered 2017-07-23 – 2017-07-25 (×4): 1250 mg via INTRAVENOUS
  Filled 2017-07-23 (×5): qty 1250

## 2017-07-23 MED ORDER — MORPHINE SULFATE (PF) 4 MG/ML IV SOLN
4.0000 mg | Freq: Once | INTRAVENOUS | Status: AC
  Administered 2017-07-23: 4 mg via INTRAVENOUS
  Filled 2017-07-23: qty 1

## 2017-07-23 MED ORDER — POVIDONE-IODINE 10 % EX SWAB: 2.0000 "application " | Freq: Once | CUTANEOUS | Status: DC

## 2017-07-23 MED ORDER — ONDANSETRON HCL 4 MG/2ML IJ SOLN: 4.0000 mg | Freq: Once | INTRAMUSCULAR | Status: DC | PRN

## 2017-07-23 MED ORDER — ONDANSETRON HCL 4 MG/2ML IJ SOLN
INTRAMUSCULAR | Status: DC | PRN
  Administered 2017-07-23: 4 mg via INTRAVENOUS

## 2017-07-23 MED ORDER — FENTANYL CITRATE (PF) 100 MCG/2ML IJ SOLN
INTRAMUSCULAR | Status: DC | PRN
  Administered 2017-07-23 (×2): 50 ug via INTRAVENOUS

## 2017-07-23 MED ORDER — DIPHENHYDRAMINE HCL 25 MG PO CAPS
25.0000 mg | ORAL_CAPSULE | Freq: Three times a day (TID) | ORAL | Status: DC | PRN
  Administered 2017-07-23 – 2017-07-25 (×3): 25 mg via ORAL
  Filled 2017-07-23 (×3): qty 1

## 2017-07-23 MED ORDER — HEPARIN (PORCINE) IN NACL 100-0.45 UNIT/ML-% IJ SOLN
1800.0000 [IU]/h | INTRAMUSCULAR | Status: AC
  Administered 2017-07-23: 1800 [IU]/h via INTRAVENOUS
  Filled 2017-07-23: qty 250

## 2017-07-23 SURGICAL SUPPLY — 33 items
BAG SPEC THK2 15X12 ZIP CLS (MISCELLANEOUS)
BAG ZIPLOCK 12X15 (MISCELLANEOUS) ×1 IMPLANT
BANDAGE ESMARK 6X9 LF (GAUZE/BANDAGES/DRESSINGS) ×1 IMPLANT
BNDG CMPR 9X6 STRL LF SNTH (GAUZE/BANDAGES/DRESSINGS) ×1
BNDG COHESIVE 3X5 TAN STRL LF (GAUZE/BANDAGES/DRESSINGS) ×1 IMPLANT
BNDG COHESIVE 6X5 TAN STRL LF (GAUZE/BANDAGES/DRESSINGS) ×1 IMPLANT
BNDG ESMARK 6X9 LF (GAUZE/BANDAGES/DRESSINGS) ×3
COVER SURGICAL LIGHT HANDLE (MISCELLANEOUS) ×3 IMPLANT
CUFF TOURN SGL QUICK 34 (TOURNIQUET CUFF)
CUFF TRNQT CYL 34X4X40X1 (TOURNIQUET CUFF) ×1 IMPLANT
DRAPE SHEET LG 3/4 BI-LAMINATE (DRAPES) ×1 IMPLANT
DRAPE SURG 17X11 SM STRL (DRAPES) ×2 IMPLANT
DRAPE U-SHAPE 47X51 STRL (DRAPES) ×2 IMPLANT
DRSG ADAPTIC 3X8 NADH LF (GAUZE/BANDAGES/DRESSINGS) ×1 IMPLANT
DURAPREP 26ML APPLICATOR (WOUND CARE) ×3 IMPLANT
ELECT REM PT RETURN 15FT ADLT (MISCELLANEOUS) ×3 IMPLANT
GAUZE SPONGE 4X4 12PLY STRL (GAUZE/BANDAGES/DRESSINGS) ×2 IMPLANT
GLOVE ORTHO TXT STRL SZ7.5 (GLOVE) ×9 IMPLANT
GOWN STRL REUS W/ TWL XL LVL3 (GOWN DISPOSABLE) ×1 IMPLANT
GOWN STRL REUS W/TWL LRG LVL3 (GOWN DISPOSABLE) ×3 IMPLANT
GOWN STRL REUS W/TWL XL LVL3 (GOWN DISPOSABLE) ×3
KIT BASIN OR (CUSTOM PROCEDURE TRAY) ×3 IMPLANT
NS IRRIG 1000ML POUR BTL (IV SOLUTION) ×3 IMPLANT
PACK TOTAL JOINT (CUSTOM PROCEDURE TRAY) ×3 IMPLANT
PAD CAST 4YDX4 CTTN HI CHSV (CAST SUPPLIES) ×1 IMPLANT
PADDING CAST COTTON 4X4 STRL (CAST SUPPLIES)
POSITIONER SURGICAL ARM (MISCELLANEOUS) ×2 IMPLANT
STOCKINETTE 8 INCH (MISCELLANEOUS) ×1 IMPLANT
SUCTION FRAZIER HANDLE 10FR (MISCELLANEOUS)
SUCTION TUBE FRAZIER 10FR DISP (MISCELLANEOUS) ×1 IMPLANT
SUT ETHILON 2 0 PSLX (SUTURE) ×4 IMPLANT
TOWEL OR 17X26 10 PK STRL BLUE (TOWEL DISPOSABLE) ×3 IMPLANT
WATER STERILE IRR 1000ML POUR (IV SOLUTION) ×3 IMPLANT

## 2017-07-23 NOTE — H&P (View-Only) (Signed)
 Reason for Consult: right great toe grade 4 Wagner ulcer with osteomyelitis Referring Physician: C. Madera MD  [name] is an 71 y.o. male.  HPI: 71-year-old male with history of type 2 diabetes, hypertension PAF on Xarelto chronically with nonhealing ulcer of the great toe of the right foot.  He had an MRI scan in August which was negative for osteomyelitis.  He has had increased pain and x-rays on admission with swollen, draining great toe was consistent with osteomyelitis.  MRI scan obtained today confirms osteomyelitis of the distal phalanx.  Past Medical History:  Diagnosis Date  . Chronic anticoagulation   . Chronic back pain   . Chronic chest pain   . Chronic venous insufficiency   . Diabetes mellitus without complication (HCC)   . Emphysema   . HTN (hypertension)   . Obesity   . PAF (paroxysmal atrial fibrillation) (HCC)   . Pneumothorax   . Rheumatoid arthritis(714.0)     Past Surgical History:  Procedure Laterality Date  . ablation    . CARDIAC CATHETERIZATION  1998   NORMAL  . COLONOSCOPY WITH PROPOFOL N/A 08/25/2016   Procedure: COLONOSCOPY WITH PROPOFOL;  Surgeon: James Edwards, MD;  Location: [facility];  Service: Endoscopy;  Laterality: N/A;  . HERNIA REPAIR    . LEFT HEART CATH AND CORONARY ANGIOGRAPHY N/A 10/17/2016   Procedure: Left Heart Cath and Coronary Angiography;  Surgeon: Peter M [name], MD;  Location: [facility];  Service: Cardiovascular;  Laterality: N/A;  . PLEURAL SCARIFICATION    . VEIN LIGATION AND STRIPPING      Family History  Problem Relation Age of Onset  . Other Father        CEREBRAL THROMBOSIS  . Rheum arthritis Mother   . Bone cancer Mother   . Heart failure Brother 50  . Coronary artery disease Brother 62       PTCA  . ALS Sister   . Rheum arthritis Brother   . Rheum arthritis Brother     Social History:  reports that he quit smoking about 21 years ago. His smoking use included cigarettes. He has a 60.00 pack-year  smoking history. he has never used smokeless tobacco. He reports that he drinks alcohol. He reports that he does not use drugs.  Allergies: No Known Allergies  Medications: I have reviewed the patient's current medications.  Results for orders placed or performed during the hospital encounter of 07/22/17 (from the past 48 hour(s))  CBC with Differential     Status: Abnormal   Collection Time: 07/22/17  8:03 PM  Result Value Ref Range   WBC 23.9 (H) 4.0 - 10.5 K/uL    Comment: WHITE COUNT CONFIRMED ON SMEAR   RBC 4.39 4.22 - 5.81 MIL/uL   Hemoglobin 12.7 (L) 13.0 - 17.0 g/dL   HCT 39.2 39.0 - 52.0 %   MCV 89.3 78.0 - 100.0 fL   MCH 28.9 26.0 - 34.0 pg   MCHC 32.4 30.0 - 36.0 g/dL   RDW 18.5 (H) 11.5 - 15.5 %   Platelets 103 (L) 150 - 400 K/uL    Comment: SPECIMEN CHECKED FOR CLOTS REPEATED TO VERIFY PLATELET COUNT CONFIRMED BY SMEAR    Neutrophils Relative % 71 %   Lymphocytes Relative 14 %   Monocytes Relative 11 %   Eosinophils Relative 3 %   Basophils Relative 1 %   Neutro Abs 17.1 (H) 1.7 - 7.7 K/uL   Lymphs Abs 3.3 0.7 - 4.0   K/uL   Monocytes Absolute 2.6 (H) 0.1 - 1.0 K/uL   Eosinophils Absolute 0.7 0.0 - 0.7 K/uL   Basophils Absolute 0.2 (H) 0.0 - 0.1 K/uL   WBC Morphology MILD LEFT SHIFT (1-5% METAS, OCC MYELO, OCC BANDS)   Protime-INR     Status: Abnormal   Collection Time: 07/22/17  8:03 PM  Result Value Ref Range   Prothrombin Time 16.6 (H) 11.4 - 15.2 seconds   INR 1.35   Comprehensive metabolic panel     Status: Abnormal   Collection Time: 07/22/17  8:03 PM  Result Value Ref Range   Sodium 136 135 - 145 mmol/L   Potassium 3.6 3.5 - 5.1 mmol/L   Chloride 99 (L) 101 - 111 mmol/L   CO2 25 22 - 32 mmol/L   Glucose, Bld 176 (H) 65 - 99 mg/dL   BUN 22 (H) 6 - 20 mg/dL   Creatinine, Ser 1.54 (H) 0.61 - 1.24 mg/dL   Calcium 9.5 8.9 - 10.3 mg/dL   Total Protein 8.5 (H) 6.5 - 8.1 g/dL   Albumin 3.8 3.5 - 5.0 g/dL   AST 21 15 - 41 U/L   ALT 15 (L) 17 - 63 U/L    Alkaline Phosphatase 71 38 - 126 U/L   Total Bilirubin 1.4 (H) 0.3 - 1.2 mg/dL   GFR calc non Af Amer 44 (L) >60 mL/min   GFR calc Af Amer 51 (L) >60 mL/min    Comment: (NOTE) The eGFR has been calculated using the CKD EPI equation. This calculation has not been validated in all clinical situations. eGFR's persistently <60 mL/min signify possible Chronic Kidney Disease.    Anion gap 12 5 - 15  CBC     Status: Abnormal   Collection Time: 07/23/17 12:12 AM  Result Value Ref Range   WBC 22.4 (H) 4.0 - 10.5 K/uL    Comment: WHITE COUNT CONFIRMED ON SMEAR   RBC 4.05 (L) 4.22 - 5.81 MIL/uL   Hemoglobin 11.6 (L) 13.0 - 17.0 g/dL   HCT 36.2 (L) 39.0 - 52.0 %   MCV 89.4 78.0 - 100.0 fL   MCH 28.6 26.0 - 34.0 pg   MCHC 32.0 30.0 - 36.0 g/dL   RDW 18.6 (H) 11.5 - 15.5 %   Platelets 96 (L) 150 - 400 K/uL    Comment: SPECIMEN CHECKED FOR CLOTS REPEATED TO VERIFY PLATELET COUNT CONFIRMED BY SMEAR CONSISTENT WITH PREVIOUS RESULT   Basic metabolic panel     Status: Abnormal   Collection Time: 07/23/17 12:12 AM  Result Value Ref Range   Sodium 134 (L) 135 - 145 mmol/L   Potassium 3.3 (L) 3.5 - 5.1 mmol/L   Chloride 100 (L) 101 - 111 mmol/L   CO2 26 22 - 32 mmol/L   Glucose, Bld 209 (H) 65 - 99 mg/dL   BUN 21 (H) 6 - 20 mg/dL   Creatinine, Ser 1.48 (H) 0.61 - 1.24 mg/dL   Calcium 9.0 8.9 - 10.3 mg/dL   GFR calc non Af Amer 46 (L) >60 mL/min   GFR calc Af Amer 53 (L) >60 mL/min    Comment: (NOTE) The eGFR has been calculated using the CKD EPI equation. This calculation has not been validated in all clinical situations. eGFR's persistently <60 mL/min signify possible Chronic Kidney Disease.    Anion gap 8 5 - 15  Hemoglobin A1c     Status: Abnormal   Collection Time: 07/23/17 12:12 AM  Result Value Ref Range     Hgb A1c MFr Bld 7.6 (H) 4.8 - 5.6 %    Comment: (NOTE) Pre diabetes:          5.7%-6.4% Diabetes:              >6.4% Glycemic control for   <7.0% adults with diabetes     Mean Plasma Glucose 171.42 mg/dL    Comment: Performed at [facility] [address], [place], [place] [address]  Sedimentation rate     Status: Abnormal   Collection Time: 07/23/17 12:12 AM  Result Value Ref Range   Sed Rate 48 (H) 0 - 16 mm/hr  C-reactive protein     Status: Abnormal   Collection Time: 07/23/17 12:12 AM  Result Value Ref Range   CRP 9.5 (H) <1.0 mg/dL    Comment: Performed at [facility] [address], [place], [place] [address]  Prealbumin     Status: Abnormal   Collection Time: 07/23/17 12:12 AM  Result Value Ref Range   Prealbumin 15.9 (L) 18 - 38 mg/dL    Comment: Performed at [facility] [address], [place], [place] [address]  Glucose, capillary     Status: Abnormal   Collection Time: 07/23/17  7:10 AM  Result Value Ref Range   Glucose-Capillary 215 (H) 65 - 99 mg/dL  Heparin level (unfractionated)     Status: None   Collection Time: 07/23/17  8:39 AM  Result Value Ref Range   Heparin Unfractionated 0.36 0.30 - 0.70 IU/mL    Comment:        IF HEPARIN RESULTS ARE BELOW EXPECTED VALUES, AND PATIENT DOSAGE HAS BEEN CONFIRMED, SUGGEST FOLLOW UP TESTING OF ANTITHROMBIN III LEVELS.   APTT     Status: None   Collection Time: 07/23/17  8:39 AM  Result Value Ref Range   aPTT 34 24 - 36 seconds  Glucose, capillary     Status: Abnormal   Collection Time: 07/23/17 12:15 PM  Result Value Ref Range   Glucose-Capillary 216 (H) 65 - 99 mg/dL    Mr Toes Right W Wo Contrast  Result Date: 07/23/2017 CLINICAL DATA:  Pain and swelling of the right great toe with evidence of osteomyelitis on x-ray. EXAM: MRI OF THE RIGHT TOES WITHOUT AND WITH CONTRAST TECHNIQUE: Multiplanar, multisequence MR imaging of the right forefoot was performed both before and after administration of intravenous contrast. CONTRAST:  3m MULTIHANCE GADOBENATE DIMEGLUMINE 529 MG/ML IV SOLN COMPARISON:  Right foot x-rays from yesterday. FINDINGS: Bones/Joint/Cartilage  Abnormal increased STIR signal with corresponding T1 hypointensity and cortical destruction in the first distal phalanx, consistent with osteomyelitis. Remaining bone marrow signal is preserved. No fracture or dislocation. Normal alignment. No joint effusion. Ligaments Collateral ligaments are intact. Muscles and Tendons Flexor, peroneal and extensor compartment tendons are intact. Diffuse, severe fatty atrophy of the foot muscles. Soft tissue Skin ulceration at the tip of the great toe, with a draining sinus tract extending to the first distal phalanx. There is a small 1.8 x 0.9 cm rim enhancing fluid collection in the soft tissue just superior to the first distal phalanx tip, consistent with abscess. Moderate dorsal forefoot soft tissue edema without enhancement. IMPRESSION: 1. Skin ulceration at the tip of the great toe with draining sinus tract extending to the first distal phalanx. Osteomyelitis of the first distal phalanx with small 1.8 cm soft tissue abscess just superior to the first distal phalanx tip. 2. Moderate dorsal forefoot soft tissue edema without enhancement, possibly  due to third spacing. Electronically Signed   By: William T Derry M.D.   On: 07/23/2017 12:05   Dg Foot 2 Views Right  Result Date: 07/22/2017 CLINICAL DATA:  Infection and pain to the right big toe. EXAM: RIGHT FOOT - 2 VIEW COMPARISON:  None. FINDINGS: There is a fragmentation of the distal aspect of the distal first right phalanx, with overlying soft tissue swelling. Findings are concerning for osteomyelitis. No evidence of soft tissue emphysema. Diffuse soft tissue swelling of the dorsum of the midfoot. IMPRESSION: Fragmentation of the distal aspect of the distal first right phalanx, concerning for osteomyelitis. Electronically Signed   By: Dobrinka  Dimitrova M.D.   On: 07/22/2017 20:04    ROS point review of systems positive for chronic anticoagulation for paroxysmal atrial fibrillation.  History of venous insufficiency,  chronic back pain, chronic chest pain, type 2 diabetes on insulin, emphysema, hypertension, obesity.  History of pneumothorax.  Total arthritis.  Previous cardiac catheterization.  Hernia repair.  Cardiac catheterization on 10/17/16. Blood pressure (!) 129/51, pulse 69, temperature 98.3 F (36.8 C), temperature source Oral, resp. rate 18, height 6' 6" (1.981 m), weight (!) 347 lb 3.2 oz (157.5 kg), SpO2 94 %. Physical Exam  Constitutional: He is oriented to person, place, and time. He appears well-developed and well-nourished.  HENT:  Head: Normocephalic.  Eyes: Pupils are equal, round, and reactive to light.  Neck: Normal range of motion.  Cardiovascular: Normal rate.  Respiratory: Effort normal. No respiratory distress. He has no wheezes.  GI: Soft.  Neurological: He is oriented to person, place, and time.  Skin:  Right great toe grade 4 Waggoner ulcer at the tip.  Drainage present.  Mild forefoot edema.  0.1 cm ulceration involving the dorsal surface of the great toe with ulceration at the tip.  Wound bed is dark red with drainage with odor brown exudate.  Assessment/Plan: Grade 4 diabetic ulcer with  Left great toe infection osteomyelitis of the distal phalanx.  He will require toe amputation treatment due to the osteomyelitis. My cell [phone]  I discussed with patient MRI results, plan Toe amputation. Risks of nonhealing and possible more proximal amputation if it does not heal discussed. Plan surgery tonight.   Romaldo C Skyeler Smola 07/23/2017, 12:50 PM      

## 2017-07-23 NOTE — Progress Notes (Signed)
Report received from Sarah RN/ED. Pt received to room 1620 via stretcher and transferred self to bed. Pt in NAD at this time. Care plan and education initiated and call bell reviewed. Pt verbalized understanding. Call bell within reach of pt.

## 2017-07-23 NOTE — Progress Notes (Signed)
Patient ID: [name], male   DOB: Dec 29, 1945, 72 y.o.   MRN: [id number] Patient seen and examined.  MRI reviewed from August 2018.  He has a new MRI ordered to rule out osteomyelitis.  Plain x-rays are suggestive but not diagnostic of osteomyelitis.  He weighs 354 pounds and the maximum table wait for the MRI scanner at [facility] is 350 pounds.  They will not allow him to have the scan done here and he will have to be transferred out for the scan and then return to his bed or transferred to [facility].  I reviewed his labs as well as all photographs of his toe.  Full consult to follow later today.  If osteomyelitis is present on the MRI that he will require surgery.  My cell [phone]

## 2017-07-23 NOTE — Interval H&P Note (Signed)
History and Physical Interval Note:  07/23/2017 9:17 PM  [name]  has presented today for surgery, with the diagnosis of left great toe osteomyelitis  The various methods of treatment have been discussed with the patient and family. After consideration of risks, benefits and other options for treatment, the patient has consented to  Procedure(s): left great toe amputation (Left) as a surgical intervention .  The patient's history has been reviewed, patient examined, no change in status, stable for surgery.  I have reviewed the patient's chart and labs.  Questions were answered to the patient's satisfaction.     Eldred Manges

## 2017-07-23 NOTE — Progress Notes (Signed)
[facility] PROGRESS NOTE  [name] WPV:[id number] DOB: Aug 21, 1945 DOA: 07/22/2017 PCP: Fraser Din, PA-C  Interim summary and HPI 72 y.o. male with medical history significant of DM2, HTN, PAF on Xeralto.  Patient presents to the ED with c/o R great toe infection.  Worsening redness, swelling to R great toe with ulcer.  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: -Patient started on vancomycin, Flagyl and Rocephin as per diabetic foot ulcer protocol -Orthopedic has been consulted -MRI of his right food confirming osteomyelitis -Will continue supportive care and analgesics -per orthopedic service rec's, plan is to proceed with right great toe amputation.  2-hypertension -Stable -Continue current antihypertensive regimen  3-type 2 diabetes with peripheral vascular disease -Follow A1c -Continue sliding scale insulin and low dose of levemir -Holding metformin and Victoza 1 inpatient -Diabetic coordinator has been consulted as per foot ulcer protocol -Will follow CBG and adjust hypoglycemic regimen as needed.  4-OSA -continue CPAP  5-morbid obesity  -continue CPAP  6-hx of PAF -holding xarelto due to anticipated surgery -using heparin per pharmacy -continue rate control agents (atenolol and amiodarone) -monitoring him on telemetry   7-morbid obesity -Body mass index is 40.12 kg/m. -low calorie diet and exercise discussed with patient    Code Status: Full Family Communication: Wife at bedside Disposition Plan: Remains inpatient, continue IV antibiotics, follow orthopedic service recommendations.   Consultants:  Orthopedic service  Procedures:  See below for x-ray report  Antibiotics:  Vancomycin, Flagyl and Rocephin 07/23/17  HPI/Subjective: Afebrile, no nausea, no vomiting, no shortness of breath, no chest pain.  Patient is in no acute distress  currently.  Objective: Vitals:   07/23/17 0850 07/23/17 1017  BP:  (!) 129/51  Pulse:  69  Resp:    Temp:    SpO2: 94%     Intake/Output Summary (Last 24 hours) at 07/23/2017 1757 Last data filed at 07/23/2017 1318 Gross per 24 hour  Intake 1220 ml  Output 450 ml  Net 770 ml   Filed Weights   07/23/17 0028 07/23/17 0827  Weight: (!) 160.6 kg (354 lb) (!) 157.5 kg (347 lb 3.2 oz)    Exam:   General: Afebrile, no chest pain, no shortness of breath, reports some drainage out of his right great toe, but denies pain.  Cardiovascular: S1 and S2, no rubs, no gallops; rate controlled.  No JVD.  Respiratory: Good air movement bilaterally, normal respiratory effort, no requiring oxygen supplementation.  Abdomen: Soft, nontender, nondistended, positive bowel sounds  Musculoskeletal: With trace to 1+ edema affecting right lower extremity; also with erythema, purulent drainage and open ulcer in his great toe.  Left lower extremity with trace edema, no open wounds, no cyanosis.  Data Reviewed: Basic Metabolic Panel: Recent Labs  Lab 07/21/17 1759 07/22/17 2003 07/23/17 0012  NA 134* 136 134*  K 3.6 3.6 3.3*  CL 98* 99* 100*  CO2 26 25 26   GLUCOSE 198* 176* 209*  BUN 21* 22* 21*  CREATININE 1.40* 1.54* 1.48*  CALCIUM 9.5 9.5 9.0   Liver Function Tests: Recent Labs  Lab 07/21/17 1759 07/22/17 2003  AST 20 21  ALT 16* 15*  ALKPHOS 68 71  BILITOT 1.2 1.4*  PROT 8.5* 8.5*  ALBUMIN 3.8 3.8   CBC: Recent Labs  Lab 07/21/17 1759 07/22/17 2003 07/23/17 0012  WBC 26.9* 23.9* 22.4*  NEUTROABS 20.2* 17.1*  --   HGB 12.4* 12.7* 11.6*  HCT 39.2 39.2 36.2*  MCV 89.3 89.3 89.4  PLT 125* 103* 96*   CBG: Recent Labs  Lab 07/23/17 0710 07/23/17 1215 07/23/17 1700  GLUCAP 215* 216* 201*    Recent Results (from the past 240 hour(s))  Blood culture (routine x 2)     Status: None (Preliminary result)   Collection Time: 07/22/17  8:03 PM  Result Value Ref Range  Status   Specimen Description BLOOD RIGHT FOREARM  Final   Special Requests   Final    BOTTLES DRAWN AEROBIC AND ANAEROBIC Blood Culture adequate volume   Culture   Final    NO GROWTH < 24 HOURS Performed at [facility], [address], [place], [place] [address]    Report Status PENDING  Incomplete  Surgical pcr screen     Status: None   Collection Time: 07/23/17  3:02 PM  Result Value Ref Range Status   MRSA, PCR NEGATIVE NEGATIVE Final   Staphylococcus aureus NEGATIVE NEGATIVE Final    Comment: (NOTE) The Xpert SA Assay (FDA approved for NASAL specimens in patients 82 years of age and older), is one component of a comprehensive surveillance program. It is not intended to diagnose infection nor to guide or monitor treatment.      Studies: Mr [place] Right W Wo Contrast  Result Date: 07/23/2017 CLINICAL DATA:  Pain and swelling of the right great toe with evidence of osteomyelitis on x-ray. EXAM: MRI OF THE RIGHT TOES WITHOUT AND WITH CONTRAST TECHNIQUE: Multiplanar, multisequence MR imaging of the right forefoot was performed both before and after administration of intravenous contrast. CONTRAST:  39mL MULTIHANCE GADOBENATE DIMEGLUMINE 529 MG/ML IV SOLN COMPARISON:  Right foot x-rays from yesterday. FINDINGS: Bones/Joint/Cartilage Abnormal increased STIR signal with corresponding T1 hypointensity and cortical destruction in the first distal phalanx, consistent with osteomyelitis. Remaining bone marrow signal is preserved. No fracture or dislocation. Normal alignment. No joint effusion. Ligaments Collateral ligaments are intact. Muscles and Tendons Flexor, peroneal and extensor compartment tendons are intact. Diffuse, severe fatty atrophy of the foot muscles. Soft tissue Skin ulceration at the tip of the great toe, with a draining sinus tract extending to the first distal phalanx. There is a small 1.8 x 0.9 cm rim enhancing fluid collection in the soft tissue just superior to the first  distal phalanx tip, consistent with abscess. Moderate dorsal forefoot soft tissue edema without enhancement. IMPRESSION: 1. Skin ulceration at the tip of the great toe with draining sinus tract extending to the first distal phalanx. Osteomyelitis of the first distal phalanx with small 1.8 cm soft tissue abscess just superior to the first distal phalanx tip. 2. Moderate dorsal forefoot soft tissue edema without enhancement, possibly due to third spacing. Electronically Signed   By: Obie Dredge M.D.   On: 07/23/2017 12:05   Dg Foot 2 Views Right  Result Date: 07/22/2017 CLINICAL DATA:  Infection and pain to the right big toe. EXAM: RIGHT FOOT - 2 VIEW COMPARISON:  None. FINDINGS: There is a fragmentation of the distal aspect of the distal first right phalanx, with overlying soft tissue swelling. Findings are concerning for osteomyelitis. No evidence of soft tissue emphysema. Diffuse soft tissue swelling of the dorsum of the midfoot. IMPRESSION: Fragmentation of the distal aspect of the distal first right phalanx, concerning for osteomyelitis. Electronically Signed   By: Ted Mcalpine M.D.   On: 07/22/2017 20:04    Scheduled Meds: . amiodarone  200 mg Oral Daily  . atenolol  50  mg Oral BID  . chlorhexidine  60 mL Topical Once  . diltiazem  180 mg Oral Daily  . furosemide  40 mg Oral Daily  . hydrochlorothiazide  25 mg Oral Daily  . insulin aspart  0-15 Units Subcutaneous TID WC  . metroNIDAZOLE  500 mg Oral Q8H  . nutrition supplement (JUVEN)  1 packet Oral BID BM  . pantoprazole  40 mg Oral Daily  . potassium chloride SA  20 mEq Oral BID  . povidone-iodine  2 application Topical Once  . spironolactone  25 mg Oral Daily  . umeclidinium-vilanterol  1 puff Inhalation Daily   Continuous Infusions: . cefTRIAXone (ROCEPHIN)  IV Stopped (07/23/17 0236)  . heparin Stopped (07/23/17 1730)  . vancomycin 1,250 mg (07/23/17 1736)    Time spent: 30 minutes    Vassie Loll  [facility] Pager [phone]. If 7PM-7AM, please contact night-coverage at [email or web address], password [email or web address] 07/23/2017, 5:57 PM  LOS: 1 day

## 2017-07-23 NOTE — Anesthesia Procedure Notes (Signed)
Procedure Name: LMA Insertion Date/Time: 07/23/2017 9:25 PM Performed by: Doran Clay, CRNA Pre-anesthesia Checklist: Patient identified, Emergency Drugs available, Suction available and Patient being monitored Patient Re-evaluated:Patient Re-evaluated prior to induction Oxygen Delivery Method: Circle system utilized Preoxygenation: Pre-oxygenation with 100% oxygen Induction Type: IV induction LMA: LMA inserted LMA Size: 5.0 Number of attempts: 1 Placement Confirmation: positive ETCO2 and breath sounds checked- equal and bilateral Tube secured with: Tape Dental Injury: Teeth and Oropharynx as per pre-operative assessment

## 2017-07-23 NOTE — Anesthesia Postprocedure Evaluation (Signed)
Anesthesia Post Note  Patient: [name]  Procedure(s) Performed: right great toe amputation (Right )     Patient location during evaluation: PACU Anesthesia Type: General Level of consciousness: awake and alert Pain management: pain level controlled Vital Signs Assessment: post-procedure vital signs reviewed and stable Respiratory status: spontaneous breathing, nonlabored ventilation, respiratory function stable and patient connected to nasal cannula oxygen Cardiovascular status: blood pressure returned to baseline and stable Postop Assessment: no apparent nausea or vomiting Anesthetic complications: no    Last Vitals:  Vitals:   07/23/17 2206 07/23/17 2215  BP: 135/62 (!) 128/57  Pulse: 63 60  Resp:  (!) 21  Temp: 36.7 C   SpO2: 98% 99%    Last Pain:  Vitals:   07/23/17 2215  TempSrc:   PainSc: 0-No pain                 Kennieth Rad

## 2017-07-23 NOTE — Transfer of Care (Signed)
Immediate Anesthesia Transfer of Care Note  Patient: [name]  Procedure(s) Performed: right great toe amputation (Right )  Patient Location: PACU  Anesthesia Type:General  Level of Consciousness: sedated  Airway & Oxygen Therapy: Patient Spontanous Breathing and Patient connected to face mask oxygen  Post-op Assessment: Report given to RN and Post -op Vital signs reviewed and stable  Post vital signs: Reviewed and stable  Last Vitals:  Vitals:   07/23/17 0850 07/23/17 1017  BP:  (!) 129/51  Pulse:  69  Resp:    Temp:    SpO2: 94%     Last Pain:  Vitals:   07/23/17 1312  TempSrc:   PainSc: 0-No pain      Patients Stated Pain Goal: 2 (07/23/17 1211)  Complications: No apparent anesthesia complications

## 2017-07-23 NOTE — Op Note (Signed)
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  Surgeon Annell Greening MD  Anesthesia General  Tourniquet Esmarch tourniquet at the ankle  EBL: See anesthetic record.  Brief history 72 year old diabetic type II with ABIs greater than 1 and with several month history of an ulcer at the tip of the great toe.  He had chronic venous stasis problems on both legs and had an MRI scan several months ago which was negative for osteo-.  He represented with increased drainage with x-rays changes suggestive of osteo-and MRI confirming osteomyelitis of the distal phalanx.  Procedure after induction of anesthesia prepping with DuraPrep extremity sheets and drapes applied timeout procedure completed racquet handle incision was made after sterile skin marker elevation and dividing the extensor and flexor tendons and falling the proximal phalanx back to the metatarsal medical metatarsal phalangeal joint and amputating through that joint.  Sesamoids were left.  There was cautery for the digital artery lateral and medial.  Irrigation tissue at this level looked good no evidence of purulence no infection extended up the flexor tendon at this level nor on the extensor surface.  After copious irrigation wound was reapproximated with 2-0 nylon interrupted sutures Xeroform 4 x 4's Kerlix and Ace wrap are applied for postoperative dressing patient was transferred to recovery room.

## 2017-07-23 NOTE — Progress Notes (Signed)
Pharmacy Antibiotic Note  [name] is a 71 y.o. male admitted on 07/22/2017 with Wound infection.  Pharmacy has been consulted for Vancomycin dosing.  Plan: Vancomycin 1gm iv x1, then another 1gm to make 2gm total, then 1250mg  iv q12hr  Goal AUC = 400 - 500 for all indications, except meningitis (goal AUC > 500 and Cmin 15-20 mcg/mL)   Height: 6\' 6"  (198.1 cm) Weight: (!) 354 lb (160.6 kg) IBW/kg (Calculated) : 91.4  Temp (24hrs), Avg:98.3 F (36.8 C), Min:98.3 F (36.8 C), Max:98.3 F (36.8 C)  Recent Labs  Lab 07/21/17 1759 07/21/17 1811 07/22/17 2003 07/23/17 0012  WBC 26.9*  --  23.9* 22.4*  CREATININE 1.40*  --  1.54* 1.48*  LATICACIDVEN  --  1.90  --   --     Estimated Creatinine Clearance: 77.1 mL/min (A) (by C-G formula based on SCr of 1.48 mg/dL (H)).    No Known Allergies  Antimicrobials this admission: Vancomycin 07/22/2017 >> Zosyn 07/22/2017 x1 Ceftriaxone 07/22/2017 >> Flagyl 07/22/2017 >>  Dose adjustments this admission: -  Microbiology results: pending  Thank you for allowing pharmacy to be a part of this patient's care.  09/19/2017 [place] 07/23/2017 4:32 AM

## 2017-07-23 NOTE — Plan of Care (Signed)
Initiated plan of care.

## 2017-07-23 NOTE — Consult Note (Signed)
WOC Nurse wound consult note Reason for Consult: left great toe wound Wound type: neuropathic, questionably traumatic Pressure Injury POA: NA Measurement: 3cm x 2cm x 0.1cm ulcerative area with entire dorsal surface of great toe with erythema measuring 5cm Wound bed: dark red Drainage (amount, consistency, odor) malodorous, brown exudate Periwound: edematous, with erythema Dressing procedure/placement/frequency: I have provided nurses with orders for NS moist to dry dressings until MRI results and Ortho Consult is complete then Ortho orders will supersede. Patient with elevated HgA1c and low Pre-albumin, both deterrents to healing.Incidentally, pt weight noted today at 247 lbs which would be able to be performed in [facility], will discuss with bedside RN. We will not follow, but will remain available to this patient, to nursing, and the medical and/or surgical teams.  Please re-consult if we need to assist further.   Barnett Hatter, RN-C, WTA-C Wound Treatment Associate

## 2017-07-23 NOTE — Progress Notes (Signed)
Bilateral ABIs completed. ABIs and Doppler waveforms are within normal limits bilaterally at rest. ABI - Rt 1.03 Lt 1.02 [employer], RVS 07/23/2017 2:48 PM

## 2017-07-23 NOTE — Progress Notes (Signed)
ANTICOAGULATION CONSULT NOTE - Initial Consult  Pharmacy Consult for heparin Indication: atrial fibrillation  No Known Allergies  Patient Measurements: Height: 6\' 6"  (198.1 cm) Weight: (!) 347 lb 3.2 oz (157.5 kg) IBW/kg (Calculated) : 91.4 Heparin Dosing Weight: 128.1 kg  Vital Signs: Temp: 98.3 F (36.8 C) (01/10 0511) Temp Source: Oral (01/10 0511) BP: 133/47 (01/10 0511) Pulse Rate: 69 (01/10 0511)  Labs: Recent Labs    07/21/17 1759 07/22/17 2003 07/23/17 0012  HGB 12.4* 12.7* 11.6*  HCT 39.2 39.2 36.2*  PLT 125* 103* 96*  LABPROT  --  16.6*  --   INR  --  1.35  --   CREATININE 1.40* 1.54* 1.48*    Estimated Creatinine Clearance: 76.3 mL/min (A) (by C-G formula based on SCr of 1.48 mg/dL (H)).   Medical History: Past Medical History:  Diagnosis Date  . Chronic anticoagulation   . Chronic back pain   . Chronic chest pain   . Chronic venous insufficiency   . Diabetes mellitus without complication (HCC)   . Emphysema   . HTN (hypertension)   . Obesity   . PAF (paroxysmal atrial fibrillation) (HCC)   . Pneumothorax   . Rheumatoid arthritis(714.0)     Medications:  Medications Prior to Admission  Medication Sig Dispense Refill Last Dose  . acetaminophen (TYLENOL) 500 MG tablet Take 1,000-1,500 mg by mouth every 6 (six) hours as needed.   Past Week at Unknown time  . amiodarone (PACERONE) 200 MG tablet TAKE 1 TABLET BY MOUTH  DAILY 90 tablet 1 07/21/2017 at Unknown time  . ANORO ELLIPTA 62.5-25 MCG/INH AEPB INHALE 1 PUFF INTO THE LUNGS DAILY. 60 each 3 07/22/2017 at Unknown time  . atenolol (TENORMIN) 50 MG tablet Take 1 tablet (50 mg total) by mouth 2 (two) times daily. 180 tablet 3 07/22/2017 at 0600  . Chlorpheniramine-Acetaminophen (CORICIDIN HBP COLD/FLU PO) Take 1-2 tablets by mouth every 6 (six) hours as needed.   Past Month at Unknown time  . diltiazem (CARDIZEM CD) 180 MG 24 hr capsule TAKE 1 CAPSULE BY MOUTH  DAILY 90 capsule 1 07/21/2017 at Unknown time   . doxycycline (VIBRA-TABS) 100 MG tablet Take 100 mg by mouth 2 (two) times daily.     . furosemide (LASIX) 40 MG tablet TAKE 1 TABLET BY MOUTH  DAILY 90 tablet 1 07/22/2017 at Unknown time  . hydrochlorothiazide (HYDRODIURIL) 25 MG tablet TAKE 1 TABLET BY MOUTH  DAILY 90 tablet 1 07/22/2017 at Unknown time  . metFORMIN (GLUCOPHAGE-XR) 500 MG 24 hr tablet Take 1 tablet by mouth 2 (two) times daily.   07/22/2017 at Unknown time  . omeprazole (PRILOSEC) 20 MG capsule TAKE 1 CAPSULE BY MOUTH  DAILY 90 capsule 1 07/22/2017 at Unknown time  . potassium chloride SA (K-DUR,KLOR-CON) 20 MEQ tablet TAKE ONE TABLET BY MOUTH TWICE A DAY 180 tablet 2 07/22/2017 at Unknown time  . PROAIR HFA 108 (90 Base) MCG/ACT inhaler INHALE TWO PUFFS BY MOUTH EVERY 4 HOURS AS NEEDED FOR WHEEZING AND  SHORTNESS OF BREATH 8.5 each 2 Past Week at Unknown time  . spironolactone (ALDACTONE) 25 MG tablet TAKE 1 TABLET BY MOUTH  DAILY 90 tablet 1 07/22/2017 at Unknown time  . VICTOZA 18 MG/3ML SOPN Inject 0.6-1.2 mg into the skin daily. Inject 0.6 mg [place] daily for 1 week then increase to 1.2 mg [place] daily     . XARELTO 20 MG TABS tablet TAKE ONE TABLET BY MOUTH DAILY WITH SUPPER 90 tablet 1  07/21/2017 at 0530    Assessment: 72 yo on xarelto PTA for afib.  Pharmacy consulted for bridge therapy heparin for upcoming ortho surgery.   TBW 157.5 kg, HDW 128.1 kg.  Xarelto 20 qday - last dose 1/8 at 0530 am.  Scr 1.48, PLTC low at 96. MD request no bolus.  Goal of Therapy:  Heparin level 0.3-0.7 units/ml aPTT 66-102 seconds Monitor platelets by anticoagulation protocol: Yes   Plan:  Draw baseline aPTT/heparin level to evaluate effect of previous xarelto doses No bolus per MD request Start heparin drip at 1800 units/hr and check 8 hr aPTT/HL Daily HL/CBC  Herby Abraham, Pharm.D. [phone] 07/23/2017 8:47 AM      Len Childs T 07/23/2017,8:33 AM

## 2017-07-23 NOTE — Anesthesia Preprocedure Evaluation (Addendum)
Anesthesia Evaluation  Patient identified by MRN, date of birth, ID band Patient awake    Reviewed: Allergy & Precautions, NPO status , Patient's Chart, lab work & pertinent test results  Airway Mallampati: III  TM Distance: >3 FB Neck ROM: Full    Dental  (+) Dental Advisory Given   Pulmonary shortness of breath, sleep apnea , COPD, former smoker,    breath sounds clear to auscultation       Cardiovascular hypertension, Pt. on medications and Pt. on home beta blockers + Peripheral Vascular Disease  + dysrhythmias Atrial Fibrillation  Rhythm:Regular Rate:Normal  Minimal CAD on 10/2016 Cath. Nl EF   Neuro/Psych negative neurological ROS     GI/Hepatic Neg liver ROS, GERD  ,  Endo/Other  diabetes, Type 2  Renal/GU CRFRenal disease     Musculoskeletal   Abdominal   Peds  Hematology  (+) anemia ,   Anesthesia Other Findings   Reproductive/Obstetrics                            Lab Results  Component Value Date   WBC 22.4 (H) 07/23/2017   HGB 11.6 (L) 07/23/2017   HCT 36.2 (L) 07/23/2017   MCV 89.4 07/23/2017   PLT 96 (L) 07/23/2017   Lab Results  Component Value Date   CREATININE 1.48 (H) 07/23/2017   BUN 21 (H) 07/23/2017   NA 134 (L) 07/23/2017   K 3.3 (L) 07/23/2017   CL 100 (L) 07/23/2017   CO2 26 07/23/2017    Anesthesia Physical Anesthesia Plan  ASA: III and emergent  Anesthesia Plan: General   Post-op Pain Management:    Induction: Intravenous  PONV Risk Score and Plan: 2 and Ondansetron, Dexamethasone and Treatment may vary due to age or medical condition  Airway Management Planned: LMA  Additional Equipment:   Intra-op Plan:   Post-operative Plan: Extubation in OR  Informed Consent: I have reviewed the patients History and Physical, chart, labs and discussed the procedure including the risks, benefits and alternatives for the proposed anesthesia with the  patient or authorized representative who has indicated his/her understanding and acceptance.   Dental advisory given  Plan Discussed with: CRNA  Anesthesia Plan Comments:         Anesthesia Quick Evaluation

## 2017-07-23 NOTE — Progress Notes (Signed)
Inpatient Diabetes Program Recommendations  AACE/ADA: New Consensus Statement on Inpatient Glycemic Control (2015)  Target Ranges:  Prepandial:   less than 140 mg/dL      Peak postprandial:   less than 180 mg/dL (1-2 hours)      Critically ill patients:  140 - 180 mg/dL   Lab Results  Component Value Date   GLUCAP 215 (H) 07/23/2017   HGBA1C 7.6 (H) 07/23/2017    Review of Glycemic Control  Diabetes history: DM2 Outpatient Diabetes medications: metformin 500 mg bid, Victoza 1.2 mg QD (has not started) Current orders for Inpatient glycemic control: Novolog 0-15 units tidwc  HgbA1C 7.6% - fairly good glycemic control FBS > 180 mg/dL. May benefit from addition of basal insulin  Inpatient Diabetes Program Recommendations:     Add Lantus 15 units QHS Add HS correction May need meal coverage insulin if post-prandial blood sugars > 180 mg/dL. Will follow daily.  Thank you. Ailene Ards, RD, LDN, CDE Inpatient Diabetes Coordinator [phone]

## 2017-07-23 NOTE — Consult Note (Signed)
Reason for Consult: right great toe grade 4 Wagner ulcer with osteomyelitis Referring Physician: Reino Bellis MD  [name] is an 72 y.o. male.  HPI: 72 year old male with history of type 2 diabetes, hypertension PAF on Xarelto chronically with nonhealing ulcer of the great toe of the right foot.  He had an MRI scan in August which was negative for osteomyelitis.  He has had increased pain and x-rays on admission with swollen, draining great toe was consistent with osteomyelitis.  MRI scan obtained today confirms osteomyelitis of the distal phalanx.  Past Medical History:  Diagnosis Date  . Chronic anticoagulation   . Chronic back pain   . Chronic chest pain   . Chronic venous insufficiency   . Diabetes mellitus without complication ([place])   . Emphysema   . HTN (hypertension)   . Obesity   . PAF (paroxysmal atrial fibrillation) ([place])   . Pneumothorax   . Rheumatoid arthritis(714.0)     Past Surgical History:  Procedure Laterality Date  . ablation    . CARDIAC CATHETERIZATION  1998   NORMAL  . COLONOSCOPY WITH PROPOFOL N/A 08/25/2016   Procedure: COLONOSCOPY WITH PROPOFOL;  Surgeon: Laurence Spates, MD;  Location: [facility];  Service: Endoscopy;  Laterality: N/A;  . HERNIA REPAIR    . LEFT HEART CATH AND CORONARY ANGIOGRAPHY N/A 10/17/2016   Procedure: Left Heart Cath and Coronary Angiography;  Surgeon: Peter M Martinique, MD;  Location: [facility];  Service: Cardiovascular;  Laterality: N/A;  . PLEURAL SCARIFICATION    . VEIN LIGATION AND STRIPPING      Family History  Problem Relation Age of Onset  . Other Father        CEREBRAL THROMBOSIS  . Rheum arthritis Mother   . Bone cancer Mother   . Heart failure Brother 50  . Coronary artery disease Brother 76       PTCA  . ALS Sister   . Rheum arthritis Brother   . Rheum arthritis Brother     Social History:  reports that he quit smoking about 21 years ago. His smoking use included cigarettes. He has a 60.00 pack-year  smoking history. he has never used smokeless tobacco. He reports that he drinks alcohol. He reports that he does not use drugs.  Allergies: No Known Allergies  Medications: I have reviewed the patient's current medications.  Results for orders placed or performed during the hospital encounter of 07/22/17 (from the past 48 hour(s))  CBC with Differential     Status: Abnormal   Collection Time: 07/22/17  8:03 PM  Result Value Ref Range   WBC 23.9 (H) 4.0 - 10.5 K/uL    Comment: WHITE COUNT CONFIRMED ON SMEAR   RBC 4.39 4.22 - 5.81 MIL/uL   Hemoglobin 12.7 (L) 13.0 - 17.0 g/dL   HCT 39.2 39.0 - 52.0 %   MCV 89.3 78.0 - 100.0 fL   MCH 28.9 26.0 - 34.0 pg   MCHC 32.4 30.0 - 36.0 g/dL   RDW 18.5 (H) 11.5 - 15.5 %   Platelets 103 (L) 150 - 400 K/uL    Comment: SPECIMEN CHECKED FOR CLOTS REPEATED TO VERIFY PLATELET COUNT CONFIRMED BY SMEAR    Neutrophils Relative % 71 %   Lymphocytes Relative 14 %   Monocytes Relative 11 %   Eosinophils Relative 3 %   Basophils Relative 1 %   Neutro Abs 17.1 (H) 1.7 - 7.7 K/uL   Lymphs Abs 3.3 0.7 - 4.0  K/uL   Monocytes Absolute 2.6 (H) 0.1 - 1.0 K/uL   Eosinophils Absolute 0.7 0.0 - 0.7 K/uL   Basophils Absolute 0.2 (H) 0.0 - 0.1 K/uL   WBC Morphology MILD LEFT SHIFT (1-5% METAS, OCC MYELO, OCC BANDS)   Protime-INR     Status: Abnormal   Collection Time: 07/22/17  8:03 PM  Result Value Ref Range   Prothrombin Time 16.6 (H) 11.4 - 15.2 seconds   INR 1.35   Comprehensive metabolic panel     Status: Abnormal   Collection Time: 07/22/17  8:03 PM  Result Value Ref Range   Sodium 136 135 - 145 mmol/L   Potassium 3.6 3.5 - 5.1 mmol/L   Chloride 99 (L) 101 - 111 mmol/L   CO2 25 22 - 32 mmol/L   Glucose, Bld 176 (H) 65 - 99 mg/dL   BUN 22 (H) 6 - 20 mg/dL   Creatinine, Ser 1.54 (H) 0.61 - 1.24 mg/dL   Calcium 9.5 8.9 - 10.3 mg/dL   Total Protein 8.5 (H) 6.5 - 8.1 g/dL   Albumin 3.8 3.5 - 5.0 g/dL   AST 21 15 - 41 U/L   ALT 15 (L) 17 - 63 U/L    Alkaline Phosphatase 71 38 - 126 U/L   Total Bilirubin 1.4 (H) 0.3 - 1.2 mg/dL   GFR calc non Af Amer 44 (L) >60 mL/min   GFR calc Af Amer 51 (L) >60 mL/min    Comment: (NOTE) The eGFR has been calculated using the CKD EPI equation. This calculation has not been validated in all clinical situations. eGFR's persistently <60 mL/min signify possible Chronic Kidney Disease.    Anion gap 12 5 - 15  CBC     Status: Abnormal   Collection Time: 07/23/17 12:12 AM  Result Value Ref Range   WBC 22.4 (H) 4.0 - 10.5 K/uL    Comment: WHITE COUNT CONFIRMED ON SMEAR   RBC 4.05 (L) 4.22 - 5.81 MIL/uL   Hemoglobin 11.6 (L) 13.0 - 17.0 g/dL   HCT 36.2 (L) 39.0 - 52.0 %   MCV 89.4 78.0 - 100.0 fL   MCH 28.6 26.0 - 34.0 pg   MCHC 32.0 30.0 - 36.0 g/dL   RDW 18.6 (H) 11.5 - 15.5 %   Platelets 96 (L) 150 - 400 K/uL    Comment: SPECIMEN CHECKED FOR CLOTS REPEATED TO VERIFY PLATELET COUNT CONFIRMED BY SMEAR CONSISTENT WITH PREVIOUS RESULT   Basic metabolic panel     Status: Abnormal   Collection Time: 07/23/17 12:12 AM  Result Value Ref Range   Sodium 134 (L) 135 - 145 mmol/L   Potassium 3.3 (L) 3.5 - 5.1 mmol/L   Chloride 100 (L) 101 - 111 mmol/L   CO2 26 22 - 32 mmol/L   Glucose, Bld 209 (H) 65 - 99 mg/dL   BUN 21 (H) 6 - 20 mg/dL   Creatinine, Ser 1.48 (H) 0.61 - 1.24 mg/dL   Calcium 9.0 8.9 - 10.3 mg/dL   GFR calc non Af Amer 46 (L) >60 mL/min   GFR calc Af Amer 53 (L) >60 mL/min    Comment: (NOTE) The eGFR has been calculated using the CKD EPI equation. This calculation has not been validated in all clinical situations. eGFR's persistently <60 mL/min signify possible Chronic Kidney Disease.    Anion gap 8 5 - 15  Hemoglobin A1c     Status: Abnormal   Collection Time: 07/23/17 12:12 AM  Result Value Ref Range  Hgb A1c MFr Bld 7.6 (H) 4.8 - 5.6 %    Comment: (NOTE) Pre diabetes:          5.7%-6.4% Diabetes:              >6.4% Glycemic control for   <7.0% adults with diabetes     Mean Plasma Glucose 171.42 mg/dL    Comment: Performed at [facility] [address], [place], [place] [address]  Sedimentation rate     Status: Abnormal   Collection Time: 07/23/17 12:12 AM  Result Value Ref Range   Sed Rate 48 (H) 0 - 16 mm/hr  C-reactive protein     Status: Abnormal   Collection Time: 07/23/17 12:12 AM  Result Value Ref Range   CRP 9.5 (H) <1.0 mg/dL    Comment: Performed at [facility] [address], [place], [place] [address]  Prealbumin     Status: Abnormal   Collection Time: 07/23/17 12:12 AM  Result Value Ref Range   Prealbumin 15.9 (L) 18 - 38 mg/dL    Comment: Performed at [facility] [address], [place], [place] [address]  Glucose, capillary     Status: Abnormal   Collection Time: 07/23/17  7:10 AM  Result Value Ref Range   Glucose-Capillary 215 (H) 65 - 99 mg/dL  Heparin level (unfractionated)     Status: None   Collection Time: 07/23/17  8:39 AM  Result Value Ref Range   Heparin Unfractionated 0.36 0.30 - 0.70 IU/mL    Comment:        IF HEPARIN RESULTS ARE BELOW EXPECTED VALUES, AND PATIENT DOSAGE HAS BEEN CONFIRMED, SUGGEST FOLLOW UP TESTING OF ANTITHROMBIN III LEVELS.   APTT     Status: None   Collection Time: 07/23/17  8:39 AM  Result Value Ref Range   aPTT 34 24 - 36 seconds  Glucose, capillary     Status: Abnormal   Collection Time: 07/23/17 12:15 PM  Result Value Ref Range   Glucose-Capillary 216 (H) 65 - 99 mg/dL    Mr Toes Right W Wo Contrast  Result Date: 07/23/2017 CLINICAL DATA:  Pain and swelling of the right great toe with evidence of osteomyelitis on x-ray. EXAM: MRI OF THE RIGHT TOES WITHOUT AND WITH CONTRAST TECHNIQUE: Multiplanar, multisequence MR imaging of the right forefoot was performed both before and after administration of intravenous contrast. CONTRAST:  3m MULTIHANCE GADOBENATE DIMEGLUMINE 529 MG/ML IV SOLN COMPARISON:  Right foot x-rays from yesterday. FINDINGS: Bones/Joint/Cartilage  Abnormal increased STIR signal with corresponding T1 hypointensity and cortical destruction in the first distal phalanx, consistent with osteomyelitis. Remaining bone marrow signal is preserved. No fracture or dislocation. Normal alignment. No joint effusion. Ligaments Collateral ligaments are intact. Muscles and Tendons Flexor, peroneal and extensor compartment tendons are intact. Diffuse, severe fatty atrophy of the foot muscles. Soft tissue Skin ulceration at the tip of the great toe, with a draining sinus tract extending to the first distal phalanx. There is a small 1.8 x 0.9 cm rim enhancing fluid collection in the soft tissue just superior to the first distal phalanx tip, consistent with abscess. Moderate dorsal forefoot soft tissue edema without enhancement. IMPRESSION: 1. Skin ulceration at the tip of the great toe with draining sinus tract extending to the first distal phalanx. Osteomyelitis of the first distal phalanx with small 1.8 cm soft tissue abscess just superior to the first distal phalanx tip. 2. Moderate dorsal forefoot soft tissue edema without enhancement, possibly  due to third spacing. Electronically Signed   By: Titus Dubin M.D.   On: 07/23/2017 12:05   Dg Foot 2 Views Right  Result Date: 07/22/2017 CLINICAL DATA:  Infection and pain to the right big toe. EXAM: RIGHT FOOT - 2 VIEW COMPARISON:  None. FINDINGS: There is a fragmentation of the distal aspect of the distal first right phalanx, with overlying soft tissue swelling. Findings are concerning for osteomyelitis. No evidence of soft tissue emphysema. Diffuse soft tissue swelling of the dorsum of the midfoot. IMPRESSION: Fragmentation of the distal aspect of the distal first right phalanx, concerning for osteomyelitis. Electronically Signed   By: Fidela Salisbury M.D.   On: 07/22/2017 20:04    ROS point review of systems positive for chronic anticoagulation for paroxysmal atrial fibrillation.  History of venous insufficiency,  chronic back pain, chronic chest pain, type 2 diabetes on insulin, emphysema, hypertension, obesity.  History of pneumothorax.  Total arthritis.  Previous cardiac catheterization.  Hernia repair.  Cardiac catheterization on 10/17/16. Blood pressure (!) 129/51, pulse 69, temperature 98.3 F (36.8 C), temperature source Oral, resp. rate 18, height 6' 6" (1.981 m), weight (!) 347 lb 3.2 oz (157.5 kg), SpO2 94 %. Physical Exam  Constitutional: He is oriented to person, place, and time. He appears well-developed and well-nourished.  HENT:  Head: Normocephalic.  Eyes: Pupils are equal, round, and reactive to light.  Neck: Normal range of motion.  Cardiovascular: Normal rate.  Respiratory: Effort normal. No respiratory distress. He has no wheezes.  GI: Soft.  Neurological: He is oriented to person, place, and time.  Skin:  Right great toe grade 4 Waggoner ulcer at the tip.  Drainage present.  Mild forefoot edema.  0.1 cm ulceration involving the dorsal surface of the great toe with ulceration at the tip.  Wound bed is dark red with drainage with odor brown exudate.  Assessment/Plan: Grade 4 diabetic ulcer with  Left great toe infection osteomyelitis of the distal phalanx.  He will require toe amputation treatment due to the osteomyelitis. My cell [phone]  I discussed with patient MRI results, plan Toe amputation. Risks of nonhealing and possible more proximal amputation if it does not heal discussed. Plan surgery tonight.   HEARL HEIKES 07/23/2017, 12:50 PM

## 2017-07-23 NOTE — Progress Notes (Signed)
Nutrition Brief Note  Consult received for wound healing.   Wt Readings from Last 15 Encounters:  07/23/17 (!) 347 lb 3.2 oz (157.5 kg)  06/19/17 (!) 352 lb 9.6 oz (159.9 kg)  04/16/17 (!) 348 lb (157.9 kg)  02/27/17 (!) 348 lb 4.8 oz (158 kg)  11/28/16 (!) 346 lb 4.8 oz (157.1 kg)  11/25/16 (!) 350 lb 6.4 oz (158.9 kg)  10/17/16 (!) 348 lb (157.9 kg)  10/03/16 (!) 354 lb (160.6 kg)  09/26/16 (!) 346 lb 8 oz (157.2 kg)  09/04/16 (!) 342 lb (155.1 kg)  09/04/16 (!) 342 lb 9.6 oz (155.4 kg)  08/25/16 (!) 345 lb (156.5 kg)  07/10/16 (!) 343 lb 12.8 oz (155.9 kg)  06/12/16 (!) 353 lb (160.1 kg)  12/11/15 (!) 349 lb (158.3 kg)    Body mass index is 40.12 kg/m. Patient meets criteria for morbidly obese based on current BMI. He has a DM ulcer on R great toe. Pt with hx of Type 2 DM, HTN, and afib. He arrived to the ED with c/o R great toe redness, swelling x1 month which worsened in the 2 weeks PTA.   Current diet order is Carb Modified and patient is consuming mainly 100% of meals at this time.  Medications reviewed; 40 mg oral Lasix/day, 25 mg Hydrodiuril/day, sliding scale Novolog, 40 mg oral Protonix/day, 20 mEq oral KCl BID, 25 mg Aldactone/day.  Labs reviewed; Na: 134 mmol/L, K: 3.3 mmol/L, Cl: 100 mmol/L, creatinine: 1.48 mg/dL, GFR: 46 mL/min.   Will order Juven BID, each packet provides 80 kcal and 14 grams of amino acids. No additional nutrition interventions warranted at this time. If nutrition issues arise, please consult RD.      Trenton Gammon, MS, RD, LDN, [facility] Inpatient Clinical Dietitian Pager # [phone] After hours/weekend pager # [phone]

## 2017-07-23 NOTE — Progress Notes (Signed)
RT placed patient on CPAP. Patient setting is auto 10-23 cmH2O. Patient is tolerating well.

## 2017-07-24 ENCOUNTER — Encounter (HOSPITAL_COMMUNITY): Payer: Self-pay | Admitting: Orthopaedic Surgery

## 2017-07-24 DIAGNOSIS — E1122 Type 2 diabetes mellitus with diabetic chronic kidney disease: Secondary | ICD-10-CM

## 2017-07-24 DIAGNOSIS — N183 Chronic kidney disease, stage 3 (moderate): Secondary | ICD-10-CM

## 2017-07-24 LAB — CBC
HCT: 38.5 % — ABNORMAL LOW (ref 39.0–52.0)
Hemoglobin: 12.5 g/dL — ABNORMAL LOW (ref 13.0–17.0)
MCH: 29.3 pg (ref 26.0–34.0)
MCHC: 32.5 g/dL (ref 30.0–36.0)
MCV: 90.2 fL (ref 78.0–100.0)
PLATELETS: 146 10*3/uL — AB (ref 150–400)
RBC: 4.27 MIL/uL (ref 4.22–5.81)
RDW: 18.5 % — AB (ref 11.5–15.5)
WBC: 16.3 10*3/uL — AB (ref 4.0–10.5)

## 2017-07-24 LAB — GLUCOSE, CAPILLARY
GLUCOSE-CAPILLARY: 197 mg/dL — AB (ref 65–99)
GLUCOSE-CAPILLARY: 190 mg/dL — AB (ref 65–99)
Glucose-Capillary: 220 mg/dL — ABNORMAL HIGH (ref 65–99)
Glucose-Capillary: 221 mg/dL — ABNORMAL HIGH (ref 65–99)

## 2017-07-24 MED ORDER — RIVAROXABAN 10 MG PO TABS
20.0000 mg | ORAL_TABLET | Freq: Every day | ORAL | Status: DC
Start: 2017-07-24 — End: 2017-07-25
  Administered 2017-07-24: 19:00:00 20 mg via ORAL
  Filled 2017-07-24: qty 2

## 2017-07-24 MED ORDER — DIPHENHYDRAMINE-ZINC ACETATE 2-0.1 % EX CREA
TOPICAL_CREAM | Freq: Three times a day (TID) | CUTANEOUS | Status: DC | PRN
  Filled 2017-07-24: qty 28

## 2017-07-24 MED ORDER — ATENOLOL 25 MG PO TABS
25.0000 mg | ORAL_TABLET | Freq: Two times a day (BID) | ORAL | Status: DC
  Administered 2017-07-24 – 2017-07-25 (×2): 25 mg via ORAL
  Filled 2017-07-24 (×2): qty 1

## 2017-07-24 NOTE — Progress Notes (Signed)
Nurse paged Dr. Gwenlyn Perking to make provider aware of BP 127/40, P 59. Patient has cardiac meds due. Waiting on return phone call before giving meds.

## 2017-07-24 NOTE — Progress Notes (Signed)
[facility] PROGRESS NOTE  [name] UDJ:[id number] DOB: Dec 13, 1945 DOA: 07/22/2017 PCP: Ricky Din, PA-C  Interim summary and HPI 72 y.o. male with medical history significant of DM2, HTN, PAF on Xeralto.  Patient presents to the ED with c/o R great toe infection.  Worsening redness, swelling to R great toe with ulcer.  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: -MRI of his right food confirming osteomyelitis -Will continue supportive care and analgesics -S/P right great toe amputation -will follow orthopedic surgery post operative instructions -continue antibiotics for 48 hours post surgery -PT evaluation; using Darco shoe   2-hypertension -Soft, but stable -will hold some of his antihypertensive meds for now   3-type 2 diabetes with peripheral vascular disease -A1c 7.6 -Continue sliding scale insulin and low dose of levemir -Holding metformin and Victoza while inpatient -Diabetic coordinator has been consulted as per foot ulcer protocol -Will follow CBG and adjust hypoglycemic regimen as needed. -continue modified carb diet   4-OSA -continue CPAP QHS  5-hx of PAF -per orthopedic service is ok to resume xarelto tonight  -rate is well controlled -continue amiodarone and atenolol (last one, dose adjusted due to soft BP) -monitoring him on telemetry   6-morbid obesity -Body mass index is 40.12 kg/m. -low calorie diet and exercise discussed with patient   7-skin rash -continue Sarna lotion and PRN benadryl  -patient encourage not to scratch   8-CKD stage 3 -stable overall -will monitor renal function    Code Status: Full Family Communication: Wife at bedside Disposition Plan: Remains inpatient, continue IV antibiotics, follow orthopedic service recommendations.   Consultants:  Orthopedic service  Procedures:  See below for x-ray report  Right great toe  amputation 07/23/17  Antibiotics:  Vancomycin, Flagyl and Rocephin 07/23/17  HPI/Subjective: No fever, no CP, no SOB. Patient with some hives and redness on the base of his neck. Reporting pain in his right foot.  Objective: Vitals:   07/24/17 1126 07/24/17 1325  BP: (!) 127/40 (!) 119/42  Pulse: (!) 59 63  Resp: 16 16  Temp: 98.3 F (36.8 C) 98.3 F (36.8 C)  SpO2: 96% 94%    Intake/Output Summary (Last 24 hours) at 07/24/2017 1524 Last data filed at 07/24/2017 0907 Gross per 24 hour  Intake 1440 ml  Output 1625 ml  Net -185 ml   Filed Weights   07/23/17 0028 07/23/17 0827  Weight: (!) 160.6 kg (354 lb) (!) 157.5 kg (347 lb 3.2 oz)    Exam:   General: no fever, no CP, no nausea, no vomiting. Reports having patient in his right foot.   Cardiovascular: S1 and S2, no rubs, no gallops, no JVD   Respiratory: good air movement, no wheezing, no crackles, normal resp effort .  Abdomen: obese, soft, NT, ND, positive BS.  Musculoskeletal: trace edema bilaterally (R > L); right toe with dressing in place. no cyanosis.   Data Reviewed: Basic Metabolic Panel: Recent Labs  Lab 07/21/17 1759 07/22/17 2003 07/23/17 0012  NA 134* 136 134*  K 3.6 3.6 3.3*  CL 98* 99* 100*  CO2 26 25 26   GLUCOSE 198* 176* 209*  BUN 21* 22* 21*  CREATININE 1.40* 1.54* 1.48*  CALCIUM 9.5 9.5 9.0   Liver Function Tests: Recent Labs  Lab 07/21/17 1759 07/22/17 2003  AST 20 21  ALT 16* 15*  ALKPHOS 68 71  BILITOT 1.2 1.4*  PROT 8.5* 8.5*  ALBUMIN  3.8 3.8   CBC: Recent Labs  Lab 07/21/17 1759 07/22/17 2003 07/23/17 0012 07/24/17 0541  WBC 26.9* 23.9* 22.4* 16.3*  NEUTROABS 20.2* 17.1*  --   --   HGB 12.4* 12.7* 11.6* 12.5*  HCT 39.2 39.2 36.2* 38.5*  MCV 89.3 89.3 89.4 90.2  PLT 125* 103* 96* 146*   CBG: Recent Labs  Lab 07/23/17 1700 07/23/17 1916 07/23/17 2208 07/24/17 0715 07/24/17 1134  GLUCAP 201* 166* 179* 197* 220*    Recent Results (from the past 240  hour(s))  Blood culture (routine x 2)     Status: None (Preliminary result)   Collection Time: 07/22/17  8:03 PM  Result Value Ref Range Status   Specimen Description BLOOD RIGHT FOREARM  Final   Special Requests   Final    BOTTLES DRAWN AEROBIC AND ANAEROBIC Blood Culture adequate volume   Culture   Final    NO GROWTH 2 DAYS Performed at [facility], [address] [place], [place] [address]    Report Status PENDING  Incomplete  Blood culture (routine x 2)     Status: None (Preliminary result)   Collection Time: 07/23/17 12:12 AM  Result Value Ref Range Status   Specimen Description BLOOD LEFT ARM  Final   Special Requests   Final    BOTTLES DRAWN AEROBIC AND ANAEROBIC Blood Culture adequate volume   Culture   Final    NO GROWTH 1 DAY Performed at [facility], [address], [place], [place] [address]    Report Status PENDING  Incomplete  Surgical pcr screen     Status: None   Collection Time: 07/23/17  3:02 PM  Result Value Ref Range Status   MRSA, PCR NEGATIVE NEGATIVE Final   Staphylococcus aureus NEGATIVE NEGATIVE Final    Comment: (NOTE) The Xpert SA Assay (FDA approved for NASAL specimens in patients 51 years of age and older), is one component of a comprehensive surveillance program. It is not intended to diagnose infection nor to guide or monitor treatment.      Studies: Mr [place] Right W Wo Contrast  Result Date: 07/23/2017 CLINICAL DATA:  Pain and swelling of the right great toe with evidence of osteomyelitis on x-ray. EXAM: MRI OF THE RIGHT TOES WITHOUT AND WITH CONTRAST TECHNIQUE: Multiplanar, multisequence MR imaging of the right forefoot was performed both before and after administration of intravenous contrast. CONTRAST:  61mL MULTIHANCE GADOBENATE DIMEGLUMINE 529 MG/ML IV SOLN COMPARISON:  Right foot x-rays from yesterday. FINDINGS: Bones/Joint/Cartilage Abnormal increased STIR signal with corresponding T1 hypointensity and cortical destruction in  the first distal phalanx, consistent with osteomyelitis. Remaining bone marrow signal is preserved. No fracture or dislocation. Normal alignment. No joint effusion. Ligaments Collateral ligaments are intact. Muscles and Tendons Flexor, peroneal and extensor compartment tendons are intact. Diffuse, severe fatty atrophy of the foot muscles. Soft tissue Skin ulceration at the tip of the great toe, with a draining sinus tract extending to the first distal phalanx. There is a small 1.8 x 0.9 cm rim enhancing fluid collection in the soft tissue just superior to the first distal phalanx tip, consistent with abscess. Moderate dorsal forefoot soft tissue edema without enhancement. IMPRESSION: 1. Skin ulceration at the tip of the great toe with draining sinus tract extending to the first distal phalanx. Osteomyelitis of the first distal phalanx with small 1.8 cm soft tissue abscess just superior to the first distal phalanx tip. 2. Moderate dorsal forefoot soft tissue edema without enhancement,  possibly due to third spacing. Electronically Signed   By: Obie Dredge M.D.   On: 07/23/2017 12:05   Dg Foot 2 Views Right  Result Date: 07/22/2017 CLINICAL DATA:  Infection and pain to the right big toe. EXAM: RIGHT FOOT - 2 VIEW COMPARISON:  None. FINDINGS: There is a fragmentation of the distal aspect of the distal first right phalanx, with overlying soft tissue swelling. Findings are concerning for osteomyelitis. No evidence of soft tissue emphysema. Diffuse soft tissue swelling of the dorsum of the midfoot. IMPRESSION: Fragmentation of the distal aspect of the distal first right phalanx, concerning for osteomyelitis. Electronically Signed   By: Ted Mcalpine M.D.   On: 07/22/2017 20:04    Scheduled Meds: . amiodarone  200 mg Oral Daily  . atenolol  25 mg Oral BID  . diltiazem  180 mg Oral Daily  . furosemide  40 mg Oral Daily  . insulin aspart  0-15 Units Subcutaneous TID WC  . insulin detemir  10 Units  Subcutaneous QHS  . metroNIDAZOLE  500 mg Oral Q8H  . nutrition supplement (JUVEN)  1 packet Oral BID BM  . pantoprazole  40 mg Oral Daily  . potassium chloride SA  20 mEq Oral BID  . rivaroxaban  20 mg Oral Q supper  . umeclidinium-vilanterol  1 puff Inhalation Daily   Continuous Infusions: . cefTRIAXone (ROCEPHIN)  IV Stopped (07/23/17 0236)  . vancomycin Stopped (07/24/17 0815)    Time spent: 25 minutes    Vassie Loll  [facility] Pager [phone]. If 7PM-7AM, please contact night-coverage at [email or web address], password [facility] 07/24/2017, 3:24 PM  LOS: 2 days

## 2017-07-24 NOTE — Discharge Instructions (Signed)
Use darco thoe with walking. Use walker to help with balance and fall prevention. Keep dressing on until you see Dr. Ophelia Charter next week . Office # is [phone]

## 2017-07-24 NOTE — Progress Notes (Signed)
Patient has small, red bumps in neck area. Has one large knot in the middle of the neck. Patient describes them as itchy. Dr. Gwenlyn Perking aware and will assess patient.

## 2017-07-25 DIAGNOSIS — I4891 Unspecified atrial fibrillation: Secondary | ICD-10-CM

## 2017-07-25 DIAGNOSIS — E1122 Type 2 diabetes mellitus with diabetic chronic kidney disease: Secondary | ICD-10-CM

## 2017-07-25 DIAGNOSIS — E1169 Type 2 diabetes mellitus with other specified complication: Principal | ICD-10-CM

## 2017-07-25 DIAGNOSIS — N183 Chronic kidney disease, stage 3 (moderate): Secondary | ICD-10-CM

## 2017-07-25 LAB — GLUCOSE, CAPILLARY
Glucose-Capillary: 209 mg/dL — ABNORMAL HIGH (ref 65–99)
Glucose-Capillary: 223 mg/dL — ABNORMAL HIGH (ref 65–99)

## 2017-07-25 MED ORDER — DOXYCYCLINE HYCLATE 100 MG PO TABS: 100.0000 mg | ORAL_TABLET | Freq: Two times a day (BID) | ORAL | 0 refills | Status: DC

## 2017-07-25 MED ORDER — DOXYCYCLINE HYCLATE 100 MG PO TABS
100.0000 mg | ORAL_TABLET | Freq: Two times a day (BID) | ORAL | Status: DC
  Administered 2017-07-25: 100 mg via ORAL
  Filled 2017-07-25: qty 1

## 2017-07-25 MED ORDER — ATENOLOL 50 MG PO TABS: 25.0000 mg | ORAL_TABLET | Freq: Two times a day (BID) | ORAL | Status: DC

## 2017-07-25 MED ORDER — OXYCODONE HCL 5 MG PO TABS: 5.0000 mg | ORAL_TABLET | Freq: Four times a day (QID) | ORAL | 0 refills | Status: AC | PRN

## 2017-07-25 NOTE — Progress Notes (Signed)
Patient placed self on CPAP. 

## 2017-07-25 NOTE — Discharge Summary (Signed)
Physician Discharge Summary  TEREN NHAN ZOX:[id number] DOB: 10-Nov-1945 DOA: 07/22/2017  PCP: Fraser Din, PA-C  Admit date: 07/22/2017 Discharge date: 07/25/2017  Time spent: 35 minutes  Recommendations for Outpatient Follow-up:  1. Repeat basic metabolic panel to follow electrolytes and renal function 2. Reassess blood pressure and further adjust antihypertensive regimen as needed   Discharge Diagnoses:  Principal Problem:   Osteomyelitis of toe of right foot (HCC) Active Problems:   PAF (paroxysmal atrial fibrillation) (HCC)   Chronic anticoagulation   OSA (obstructive sleep apnea)   HTN (hypertension)   DM2 (diabetes mellitus, type 2) (HCC)   Diabetic ulcer of right great toe (HCC)   CKD stage 3 due to type 2 diabetes mellitus (HCC)   Discharge Condition: Stable and improved.  Patient discharged home with instruction to follow-up with orthopedic service in 1 week and with PCP in 10 days.  Diet recommendation: Heart healthy diet and modified carbohydrates.  Filed Weights   07/23/17 0028 07/23/17 0827  Weight: (!) 160.6 kg (354 lb) (!) 157.5 kg (347 lb 3.2 oz)    History of present illness:  72 y.o.malewith medical history significant ofDM2, HTN, PAF on Xeralto. Patient presents to the ED with c/o R great toe infection. Worsening redness, swelling to R great toe with ulcer. 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 Cleophas Dunker (Dr. Ophelia Charter partner) patient will be discharged on doxycycline by mouth for 10 days; follow up in 1 week with orthopedic service as an outpatient. -PRN oxycodone for severe pain prescribed -PT evaluation appreciated, no assistance devices has been recommended no home health needs. -Darco shoe  provided prior to discharge.  2-hypertension -Soft, but  stable -will hold some of his antihypertensive meds for now (spironolactone and hydrochlorothiazide; atenolol dose cut in half).   -Recommended blood pressure to be reassess at his follow-up visit with PCP to resume or further adjust antihypertensive regimen as needed -Patient advised to follow low-sodium diet   3-type 2 diabetes with peripheral vascular disease -A1c 7.6 -Diabetic coordinator has been consulted as per foot ulcer protocol; recommendations and education provided to the patient appreciated. -Will resume home hypoglycemic regimen -Patient encouraged to follow modified carbohydrate diet.  4-OSA -continue CPAP QHS  5-hx of PAF -rate is well controlled -continue amiodarone, Cardizem and atenolol (last one, dose adjusted due to soft BP) -No abnormalities appreciated on telemetry -Patient will continue Xarelto for secondary prevention.  6-morbid obesity -Body mass index is 40.12 kg/m. -low calorie diet and exercise discussed with patient   7-skin rash -Improving/resolved by time of discharge -Instructed to use Sarna lotion and PRN benadryl  -patient encourage not to scratch   8-CKD stage 3 -stable and essentially at baseline  -will recommend basic metabolic panel at follow-up to assess renal function.     Procedures:  See below for x-ray reports  Right great toe amputation by Dr. Ophelia Charter 07/23/17  Consultations:  Orthopedic service  Discharge Exam: Vitals:   07/25/17 1342 07/25/17 1345  BP: (!) 115/39 (!) 123/44  Pulse: 60   Resp: 15   Temp: 97.8 F (36.6 C)   SpO2: 91%     General: no fever, no CP, no nausea, no vomiting. Reports having moderate pain in his right foot and experiencing intermittently itching and numbness on the toe that has been amputated.   Cardiovascular:  S1 and S2, no rubs, no gallops, no JVD   Respiratory: good air movement, no wheezing, no crackles, normal resp effort .  Abdomen: obese, soft, NT, ND, positive  BS.  Musculoskeletal: trace edema bilaterally (R > L) and with stasis dermatitis changes; right toe with dressing in place and wearing Darco shoe. No cyanosis.   Discharge Instructions   Discharge Instructions    Diet - low sodium heart healthy   Complete by:  As directed    Diet Carb Modified   Complete by:  As directed    Discharge instructions   Complete by:  As directed    Take medications as prescribed Please hold hydrochlorothiazide and spironolactone on to follow-up with your primary care physician Follow heart healthy and modified carbohydrate diet Keep yourself well-hydrated Follow-up with Dr. Ophelia Charter (orthopedic service) as instructed.     Allergies as of 07/25/2017   No Known Allergies     Medication List    STOP taking these medications   acetaminophen 500 MG tablet Commonly known as:  TYLENOL   CORICIDIN HBP COLD/FLU PO   hydrochlorothiazide 25 MG tablet Commonly known as:  HYDRODIURIL   spironolactone 25 MG tablet Commonly known as:  ALDACTONE     TAKE these medications   amiodarone 200 MG tablet Commonly known as:  PACERONE TAKE 1 TABLET BY MOUTH  DAILY   ANORO ELLIPTA 62.5-25 MCG/INH Aepb Generic drug:  umeclidinium-vilanterol INHALE 1 PUFF INTO THE LUNGS DAILY.   atenolol 50 MG tablet Commonly known as:  TENORMIN Take 0.5 tablets (25 mg total) by mouth 2 (two) times daily. What changed:  how much to take   diltiazem 180 MG 24 hr capsule Commonly known as:  CARDIZEM CD TAKE 1 CAPSULE BY MOUTH  DAILY   doxycycline 100 MG tablet Commonly known as:  VIBRA-TABS Take 1 tablet (100 mg total) by mouth every 12 (twelve) hours. What changed:  when to take this   furosemide 40 MG tablet Commonly known as:  LASIX TAKE 1 TABLET BY MOUTH  DAILY   metFORMIN 500 MG 24 hr tablet Commonly known as:  GLUCOPHAGE-XR Take 1 tablet by mouth 2 (two) times daily.   omeprazole 20 MG capsule Commonly known as:  PRILOSEC TAKE 1 CAPSULE BY MOUTH  DAILY    oxyCODONE 5 MG immediate release tablet Commonly known as:  ROXICODONE Take 1 tablet (5 mg total) by mouth every 6 (six) hours as needed for severe pain.   potassium chloride SA 20 MEQ tablet Commonly known as:  K-DUR,KLOR-CON TAKE ONE TABLET BY MOUTH TWICE A DAY   PROAIR HFA 108 (90 Base) MCG/ACT inhaler Generic drug:  albuterol INHALE TWO PUFFS BY MOUTH EVERY 4 HOURS AS NEEDED FOR WHEEZING AND  SHORTNESS OF BREATH   VICTOZA 18 MG/3ML Sopn Generic drug:  liraglutide Inject 0.6-1.2 mg into the skin daily. Inject 0.6 mg [place] daily for 1 week then increase to 1.2 mg  daily   XARELTO 20 MG Tabs tablet Generic drug:  rivaroxaban TAKE ONE TABLET BY MOUTH DAILY WITH SUPPER      No Known Allergies Follow-up Information    Eldred Manges, MD Follow up in 1 week(s).   Specialty:  Orthopedic Surgery Contact information: [address] [place] [address] [phone]        Fraser Din, [place]. Schedule an appointment as soon as possible for a visit in 10 day(s).   Specialty:  Physician Field seismologist information: [address] [place]  [address] [phone]            The results of significant diagnostics from this hospitalization (including imaging, microbiology, ancillary and laboratory) are listed below for reference.    Significant Diagnostic Studies: Mr Toes Right W Wo Contrast  Result Date: 07/23/2017 CLINICAL DATA:  Pain and swelling of the right great toe with evidence of osteomyelitis on x-ray. EXAM: MRI OF THE RIGHT TOES WITHOUT AND WITH CONTRAST TECHNIQUE: Multiplanar, multisequence MR imaging of the right forefoot was performed both before and after administration of intravenous contrast. CONTRAST:  23mL MULTIHANCE GADOBENATE DIMEGLUMINE 529 MG/ML IV SOLN COMPARISON:  Right foot x-rays from yesterday. FINDINGS: Bones/Joint/Cartilage Abnormal increased STIR signal with corresponding T1 hypointensity and cortical destruction in the  first distal phalanx, consistent with osteomyelitis. Remaining bone marrow signal is preserved. No fracture or dislocation. Normal alignment. No joint effusion. Ligaments Collateral ligaments are intact. Muscles and Tendons Flexor, peroneal and extensor compartment tendons are intact. Diffuse, severe fatty atrophy of the foot muscles. Soft tissue Skin ulceration at the tip of the great toe, with a draining sinus tract extending to the first distal phalanx. There is a small 1.8 x 0.9 cm rim enhancing fluid collection in the soft tissue just superior to the first distal phalanx tip, consistent with abscess. Moderate dorsal forefoot soft tissue edema without enhancement. IMPRESSION: 1. Skin ulceration at the tip of the great toe with draining sinus tract extending to the first distal phalanx. Osteomyelitis of the first distal phalanx with small 1.8 cm soft tissue abscess just superior to the first distal phalanx tip. 2. Moderate dorsal forefoot soft tissue edema without enhancement, possibly due to third spacing. Electronically Signed   By: Obie Dredge M.D.   On: 07/23/2017 12:05   Dg Foot 2 Views Right  Result Date: 07/22/2017 CLINICAL DATA:  Infection and pain to the right big toe. EXAM: RIGHT FOOT - 2 VIEW COMPARISON:  None. FINDINGS: There is a fragmentation of the distal aspect of the distal first right phalanx, with overlying soft tissue swelling. Findings are concerning for osteomyelitis. No evidence of soft tissue emphysema. Diffuse soft tissue swelling of the dorsum of the midfoot. IMPRESSION: Fragmentation of the distal aspect of the distal first right phalanx, concerning for osteomyelitis. Electronically Signed   By: Ted Mcalpine M.D.   On: 07/22/2017 20:04    Microbiology: Recent Results (from the past 240 hour(s))  Blood culture (routine x 2)     Status: None (Preliminary result)   Collection Time: 07/22/17  8:03 PM  Result Value Ref Range Status   Specimen Description BLOOD RIGHT  FOREARM  Final   Special Requests   Final    BOTTLES DRAWN AEROBIC AND ANAEROBIC Blood Culture adequate volume   Culture   Final    NO GROWTH 3 DAYS Performed at [facility], [address] [place], [place] [address]    Report Status PENDING  Incomplete  Blood culture (routine x 2)     Status: None (Preliminary result)   Collection Time: 07/23/17 12:12 AM  Result Value Ref Range Status   Specimen Description BLOOD LEFT ARM  Final   Special Requests   Final    BOTTLES DRAWN AEROBIC AND ANAEROBIC Blood Culture adequate volume   Culture   Final    NO GROWTH 2 DAYS Performed at [facility], [address] [place], [place] [address]    Report Status PENDING  Incomplete  Surgical pcr screen     Status:  None   Collection Time: 07/23/17  3:02 PM  Result Value Ref Range Status   MRSA, PCR NEGATIVE NEGATIVE Final   Staphylococcus aureus NEGATIVE NEGATIVE Final    Comment: (NOTE) The Xpert SA Assay (FDA approved for NASAL specimens in patients 74 years of age and older), is one component of a comprehensive surveillance program. It is not intended to diagnose infection nor to guide or monitor treatment.      Labs: Basic Metabolic Panel: Recent Labs  Lab 07/21/17 1759 07/22/17 2003 07/23/17 0012  NA 134* 136 134*  K 3.6 3.6 3.3*  CL 98* 99* 100*  CO2 26 25 26   GLUCOSE 198* 176* 209*  BUN 21* 22* 21*  CREATININE 1.40* 1.54* 1.48*  CALCIUM 9.5 9.5 9.0   Liver Function Tests: Recent Labs  Lab 07/21/17 1759 07/22/17 2003  AST 20 21  ALT 16* 15*  ALKPHOS 68 71  BILITOT 1.2 1.4*  PROT 8.5* 8.5*  ALBUMIN 3.8 3.8   CBC: Recent Labs  Lab 07/21/17 1759 07/22/17 2003 07/23/17 0012 07/24/17 0541  WBC 26.9* 23.9* 22.4* 16.3*  NEUTROABS 20.2* 17.1*  --   --   HGB 12.4* 12.7* 11.6* 12.5*  HCT 39.2 39.2 36.2* 38.5*  MCV 89.3 89.3 89.4 90.2  PLT 125* 103* 96* 146*    CBG: Recent Labs  Lab 07/24/17 1134 07/24/17 1742 07/24/17 2107 07/25/17 0754  07/25/17 1153  GLUCAP 220* 190* 221* 209* 223*   Signed:  09/22/17 MD.  [facility] 07/25/2017, 2:13 PM

## 2017-07-25 NOTE — Progress Notes (Signed)
Subjective: 2 Days Post-Op Procedure(s) (LRB): right great toe amputation (Right) Patient reports pain as mild.    Objective: Vital signs in last 24 hours: Temp:  [98.2 F (36.8 C)-98.4 F (36.9 C)] 98.4 F (36.9 C) (01/12 0549) Pulse Rate:  [57-66] 63 (01/12 0828) Resp:  [15-17] 16 (01/12 0738) BP: (119-138)/(40-52) 138/50 (01/12 0828) SpO2:  [92 %-100 %] 92 % (01/12 0738)  Intake/Output from previous day: 01/11 0701 - 01/12 0700 In: 720 [P.O.:720] Out: 600 [Urine:600] Intake/Output this shift: Total I/O In: 240 [P.O.:240] Out: -   Recent Labs    07/22/17 2003 07/23/17 0012 07/24/17 0541  HGB 12.7* 11.6* 12.5*   Recent Labs    07/23/17 0012 07/24/17 0541  WBC 22.4* 16.3*  RBC 4.05* 4.27  HCT 36.2* 38.5*  PLT 96* 146*   Recent Labs    07/22/17 2003 07/23/17 0012  NA 136 134*  K 3.6 3.3*  CL 99* 100*  CO2 25 26  BUN 22* 21*  CREATININE 1.54* 1.48*  GLUCOSE 176* 209*  CALCIUM 9.5 9.0   Recent Labs    07/22/17 2003  INR 1.35    Compartment soft, dressing left foot dry  Assessment/Plan: 2 Days Post-Op Procedure(s) (LRB): right great toe amputation (Right) Discharge to home-f/u with Dr Ophelia Charter mid to end of next week  [name] 07/25/2017, 10:30 AM

## 2017-07-25 NOTE — Progress Notes (Signed)
Patient ID: [name], male   DOB: Mar 06, 1946, 72 y.o.   MRN: [id number] 48 hr IV  ABX post op then OK for D/C home and office followup in one week.

## 2017-07-25 NOTE — Care Management Note (Signed)
72 yo M s/p R great toe amputation. Hx of DM, RA, and PAF. Received referral to assist pt with [facility] and DME. Met with pt at bedside. He plans to return home with the support of his wife. He has a cane and a walker. He denies any D/C needs at this time.

## 2017-07-25 NOTE — Evaluation (Signed)
Physical Therapy Evaluation Patient Details Name: [name] MRN: [id number] DOB: [birth year] Today's Date: 07/25/2017   History of Present Illness  Pt s/p R great toe amputation and with hx fo DM, RA, and PAF  Clinical Impression  Pt admitted as above and currently mobilizing at MOD I level and with good awareness of deficits and safety.  Pt hopes for dc home this date.  No further PT needs identified and PT services will be dc.    Follow Up Recommendations No PT follow up    Equipment Recommendations  None recommended by PT    Recommendations for Other Services       Precautions / Restrictions Precautions Precautions: Fall Restrictions Weight Bearing Restrictions: No Other Position/Activity Restrictions: Darko shoe for WB      Mobility  Bed Mobility Overal bed mobility: Modified Independent                Transfers Overall transfer level: Modified independent                  Ambulation/Gait Ambulation/Gait assistance: Supervision;Modified independent (Device/Increase time) Ambulation Distance (Feet): 130 Feet Assistive device: Rolling walker (2 wheeled) Gait Pattern/deviations: Step-through pattern;Decreased step length - left;Decreased step length - right;Wide base of support;Shuffle Gait velocity: mod pace   General Gait Details: Antalgic gait with Darko shoe in place on R but with good awareness and stability using RW.  Discussed shoe on opposite foot to reduce leg length descrepancy  Stairs Stairs: Yes Stairs assistance: Min guard Stair Management: One rail Right;Forwards;Alternating pattern Number of Stairs: 5    Wheelchair Mobility    Modified Rankin (Stroke Patients Only)       Balance Overall balance assessment: No apparent balance deficits (not formally assessed)                                           Pertinent Vitals/Pain Pain Assessment: 0-10 Pain Score: 4  Pain Location: R foot Pain Descriptors /  Indicators: Sore;Aching Pain Intervention(s): Limited activity within patient's tolerance;Monitored during session;Patient requesting pain meds-RN notified    Home Living Family/patient expects to be discharged to:: Private residence Living Arrangements: Spouse/significant other Available Help at Discharge: Family Type of Home: House Home Access: Stairs to enter Entrance Stairs-Rails: None(has posts to hang onto) Secretary/administrator of Steps: 4 Home Layout: One level Home Equipment: Environmental consultant - 2 wheels;Cane - single point      Prior Function Level of Independence: Independent               Hand Dominance        Extremity/Trunk Assessment   Upper Extremity Assessment Upper Extremity Assessment: Overall WFL for tasks assessed    Lower Extremity Assessment Lower Extremity Assessment: Overall WFL for tasks assessed    Cervical / Trunk Assessment Cervical / Trunk Assessment: Kyphotic  Communication   Communication: No difficulties  Cognition Arousal/Alertness: Awake/alert Behavior During Therapy: WFL for tasks assessed/performed Overall Cognitive Status: Within Functional Limits for tasks assessed                                        General Comments      Exercises     Assessment/Plan    PT Assessment Patient needs continued PT services  PT Problem List Decreased  activity tolerance;Obesity;Pain       PT Treatment Interventions DME instruction;Gait training;Stair training;Functional mobility training;Patient/family education    PT Goals (Current goals can be found in the Care Plan section)  Acute Rehab PT Goals Patient Stated Goal: HOME and to the beach next week PT Goal Formulation: All assessment and education complete, DC therapy    Frequency Min 1X/week   Barriers to discharge        Co-evaluation               AM-PAC PT "6 Clicks" Daily Activity  Outcome Measure Difficulty turning over in bed (including adjusting  bedclothes, sheets and blankets)?: A Little Difficulty moving from lying on back to sitting on the side of the bed? : A Little Difficulty sitting down on and standing up from a chair with arms (e.g., wheelchair, bedside commode, etc,.)?: A Little Help needed moving to and from a bed to chair (including a wheelchair)?: None Help needed walking in hospital room?: None Help needed climbing 3-5 steps with a railing? : A Little 6 Click Score: 20    End of Session   Activity Tolerance: Patient tolerated treatment well Patient left: in bed;with call bell/phone within reach;with nursing/sitter in room Nurse Communication: Mobility status PT Visit Diagnosis: Difficulty in walking, not elsewhere classified (R26.2)    Time: 0973-5329 PT Time Calculation (min) (ACUTE ONLY): 19 min   Charges:   PT Evaluation $PT Eval Low Complexity: 1 Low     PT G Codes:        Pg [phone]   [name] 07/25/2017, 8:41 AM

## 2017-07-25 NOTE — Progress Notes (Signed)
Discharged from floor via w/c for transport home by car. Spouse & belongings with pt. No changes in assessment. [name]  

## 2017-07-27 LAB — CULTURE, BLOOD (ROUTINE X 2)
Culture: NO GROWTH
SPECIAL REQUESTS: ADEQUATE

## 2017-07-28 LAB — CULTURE, BLOOD (ROUTINE X 2)
Culture: NO GROWTH
Special Requests: ADEQUATE

## 2017-08-05 ENCOUNTER — Encounter (INDEPENDENT_AMBULATORY_CARE_PROVIDER_SITE_OTHER): Payer: Self-pay | Admitting: Orthopaedic Surgery

## 2017-08-05 ENCOUNTER — Ambulatory Visit (INDEPENDENT_AMBULATORY_CARE_PROVIDER_SITE_OTHER): Payer: Medicare Other | Admitting: Orthopaedic Surgery

## 2017-08-05 VITALS — BP 147/63 | HR 59 | Ht 78.0 in | Wt 366.0 lb

## 2017-08-05 DIAGNOSIS — M869 Osteomyelitis, unspecified: Secondary | ICD-10-CM

## 2017-08-05 MED ORDER — HYDROCODONE-ACETAMINOPHEN 5-325 MG PO TABS: 1.0000 | ORAL_TABLET | Freq: Two times a day (BID) | ORAL | 0 refills | Status: DC | PRN

## 2017-08-05 NOTE — Progress Notes (Signed)
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  He is on his feet a lot.  Serous drainage is occurring between the sutures.  He has venous stasis problems with 4+ edema of his legs.  He needs to be off his foot keep foot elevated above his heart to decrease the swelling to allow the wound to heal otherwise he will end up with infection and wound up with a more proximal amputation.  He was given some doxycycline by his PCP and I recommend he take it and I will recheck him in 2 weeks he will stay off his foot other than getting up to go to the bathroom.  Chief Complaint:  Chief Complaint  Patient presents with  . Right Great Toe - Routine Post Op   Visit Diagnoses:  1. 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  Follow-Up Instructions: Return in about 2 weeks (around 08/19/2017).   Orders:  No orders of the defined types were placed in this encounter.  No orders of the defined types were placed in this encounter.   Imaging: No results found.  PMFS History: Patient Active Problem List   Diagnosis Date Noted  . CKD stage 3 due to type 2 diabetes mellitus (HCC)   . Diabetic ulcer of right great toe (HCC) 07/22/2017  . Osteomyelitis of toe of right foot (HCC) 07/22/2017  . Foot ulcer, right (HCC) 06/19/2017  . Leukocytosis 09/26/2016  . Chronic ulcer of left foot (HCC) 09/26/2016  . Abnormal nuclear stress test 09/04/2016  . Exertional angina (HCC)   . Acute blood loss anemia 08/21/2016  . BRBPR (bright red blood per rectum) 08/21/2016  . HTN (hypertension) 08/21/2016  . DM2 (diabetes mellitus, type 2) (HCC) 08/21/2016  . Epistaxis 06/12/2016  . Neck pain on left side 06/12/2016  . Chronic venous insufficiency 10/01/2015  . Respiratory  distress 09/19/2013  . GERD (gastroesophageal reflux disease) 09/05/2013  . Pain in limb 11/30/2012  . Varicose veins of lower extremities with other complications 11/30/2012  . COPD (chronic obstructive pulmonary disease) (HCC) 12/12/2010  . OSA (obstructive sleep apnea) 12/12/2010  . Rheumatoid arthritis(714.0) 12/12/2010  . Intercostal pain 10/04/2010  . High risk medications (not anticoagulants) long-term use 10/04/2010  . Dyspnea 10/04/2010  . PAF (paroxysmal atrial fibrillation) (HCC)   . Chronic anticoagulation   . Chronic chest pain   . Obesity    Past Medical History:  Diagnosis Date  . Chronic anticoagulation   . Chronic back pain   . Chronic chest pain   . Chronic venous insufficiency   . Diabetes mellitus without complication (HCC)   . Emphysema   . HTN (hypertension)   . Obesity   . PAF (paroxysmal atrial fibrillation) (HCC)   . Pneumothorax   . Rheumatoid arthritis(714.0)     Family History  Problem Relation Age of Onset  . Other Father        CEREBRAL THROMBOSIS  . Rheum arthritis Mother   . Bone cancer Mother   . Heart failure Brother 50  . Coronary artery disease Brother 68       PTCA  . ALS Sister   . Rheum  arthritis Brother   . Rheum arthritis Brother     Past Surgical History:  Procedure Laterality Date  . ablation    . AMPUTATION Right 07/23/2017   Procedure: right great toe amputation;  Surgeon: Eldred Manges, MD;  Location: [facility];  Service: Orthopedics;  Laterality: Right;  . CARDIAC CATHETERIZATION  1998   NORMAL  . COLONOSCOPY WITH PROPOFOL N/A 08/25/2016   Procedure: COLONOSCOPY WITH PROPOFOL;  Surgeon: Carman Ching, MD;  Location: [facility];  Service: Endoscopy;  Laterality: N/A;  . HERNIA REPAIR    . LEFT HEART CATH AND CORONARY ANGIOGRAPHY N/A 10/17/2016   Procedure: Left Heart Cath and Coronary Angiography;  Surgeon: Peter M Swaziland, MD;  Location: [facility] INVASIVE CV LAB;  Service: Cardiovascular;  Laterality: N/A;  . PLEURAL  SCARIFICATION    . VEIN LIGATION AND STRIPPING     Social History   Occupational History  . Occupation: Occupational psychologist: [employer]  Tobacco Use  . Smoking status: Former Smoker    Packs/day: 2.00    Years: 30.00    Pack years: 60.00    Types: Cigarettes    Last attempt to quit: 03/14/1996    Years since quitting: 21.4  . Smokeless tobacco: Never Used  Substance and Sexual Activity  . Alcohol use: Yes    Comment: one beer per month  . Drug use: No  . Sexual activity: No

## 2017-08-05 NOTE — Addendum Note (Signed)
Addended by: Rogers Seeds on: 08/05/2017 10:49 AM   Modules accepted: Orders

## 2017-08-13 ENCOUNTER — Other Ambulatory Visit: Payer: Self-pay | Admitting: Cardiology

## 2017-08-14 ENCOUNTER — Other Ambulatory Visit: Payer: Self-pay | Admitting: Emergency Medicine

## 2017-08-14 NOTE — Telephone Encounter (Signed)
REFILL 

## 2017-08-18 ENCOUNTER — Encounter (INDEPENDENT_AMBULATORY_CARE_PROVIDER_SITE_OTHER): Payer: Self-pay | Admitting: Orthopaedic Surgery

## 2017-08-18 ENCOUNTER — Ambulatory Visit (INDEPENDENT_AMBULATORY_CARE_PROVIDER_SITE_OTHER): Payer: Medicare Other | Admitting: Orthopaedic Surgery

## 2017-08-18 VITALS — BP 138/69 | HR 63

## 2017-08-18 DIAGNOSIS — S98111A Complete traumatic amputation of right great toe, initial encounter: Secondary | ICD-10-CM

## 2017-08-18 DIAGNOSIS — Z89411 Acquired absence of right great toe: Secondary | ICD-10-CM

## 2017-08-18 MED ORDER — DOXYCYCLINE HYCLATE 100 MG PO TABS: 100.0000 mg | ORAL_TABLET | Freq: Two times a day (BID) | ORAL | 0 refills | Status: DC

## 2017-08-18 NOTE — Progress Notes (Signed)
Post-Op Visit Note   Patient: [name]           Date of Birth: Jul 03, 1946           MRN: [id number] Visit Date: 08/18/2017 PCP: Fraser Din, PA-C   Assessment & Plan: Postop toe amputation right great toe.  He has venous insufficiency with lower extremity swelling eschar is present along the lateral aspect of the incision underneath there is some purulence underneath the eschar but the wound remains together.  No cellulitis.  He needs to take a shower apply soap water dry his toes carefully and then apply lotion and then reapply a support stocking.  I plan to recheck him again in 2 weeks.  Chief Complaint:  Chief Complaint  Patient presents with  . Right Foot - Routine Post Op   Visit Diagnoses:  1. Amputated great toe of right foot (HCC)     Plan: Recheck 2 weeks.  He is off doxycycline since he ran out and we will send in a refill.  Recheck 2 weeks.  Follow-Up Instructions: Return in about 2 weeks (around 09/01/2017).   Orders:  No orders of the defined types were placed in this encounter.  No orders of the defined types were placed in this encounter.   Imaging: No results found.  PMFS History: Patient Active Problem List   Diagnosis Date Noted  . CKD stage 3 due to type 2 diabetes mellitus (HCC)   . Diabetic ulcer of right great toe (HCC) 07/22/2017  . Osteomyelitis of toe of right foot (HCC) 07/22/2017  . Foot ulcer, right (HCC) 06/19/2017  . Leukocytosis 09/26/2016  . Chronic ulcer of left foot (HCC) 09/26/2016  . Abnormal nuclear stress test 09/04/2016  . Exertional angina (HCC)   . Acute blood loss anemia 08/21/2016  . BRBPR (bright red blood per rectum) 08/21/2016  . HTN (hypertension) 08/21/2016  . DM2 (diabetes mellitus, type 2) (HCC) 08/21/2016  . Epistaxis 06/12/2016  . Neck pain on left side 06/12/2016  . Chronic venous insufficiency 10/01/2015  . Respiratory distress 09/19/2013  . GERD (gastroesophageal reflux disease) 09/05/2013  . Pain in  limb 11/30/2012  . Varicose veins of lower extremities with other complications 11/30/2012  . COPD (chronic obstructive pulmonary disease) (HCC) 12/12/2010  . OSA (obstructive sleep apnea) 12/12/2010  . Rheumatoid arthritis(714.0) 12/12/2010  . Intercostal pain 10/04/2010  . High risk medications (not anticoagulants) long-term use 10/04/2010  . Dyspnea 10/04/2010  . PAF (paroxysmal atrial fibrillation) (HCC)   . Chronic anticoagulation   . Chronic chest pain   . Obesity    Past Medical History:  Diagnosis Date  . Chronic anticoagulation   . Chronic back pain   . Chronic chest pain   . Chronic venous insufficiency   . Diabetes mellitus without complication (HCC)   . Emphysema   . HTN (hypertension)   . Obesity   . PAF (paroxysmal atrial fibrillation) (HCC)   . Pneumothorax   . Rheumatoid arthritis(714.0)     Family History  Problem Relation Age of Onset  . Other Father        CEREBRAL THROMBOSIS  . Rheum arthritis Mother   . Bone cancer Mother   . Heart failure Brother 50  . Coronary artery disease Brother 80       PTCA  . ALS Sister   . Rheum arthritis Brother   . Rheum arthritis Brother     Past Surgical History:  Procedure Laterality Date  . ablation    .  AMPUTATION Right 07/23/2017   Procedure: right great toe amputation;  Surgeon: Eldred Manges, MD;  Location: [facility];  Service: Orthopedics;  Laterality: Right;  . CARDIAC CATHETERIZATION  1998   NORMAL  . COLONOSCOPY WITH PROPOFOL N/A 08/25/2016   Procedure: COLONOSCOPY WITH PROPOFOL;  Surgeon: Carman Ching, MD;  Location: [facility];  Service: Endoscopy;  Laterality: N/A;  . HERNIA REPAIR    . LEFT HEART CATH AND CORONARY ANGIOGRAPHY N/A 10/17/2016   Procedure: Left Heart Cath and Coronary Angiography;  Surgeon: Peter M Swaziland, MD;  Location: [facility] INVASIVE CV LAB;  Service: Cardiovascular;  Laterality: N/A;  . PLEURAL SCARIFICATION    . VEIN LIGATION AND STRIPPING     Social History   Occupational History  .  Occupation: Occupational psychologist: [employer]  Tobacco Use  . Smoking status: Former Smoker    Packs/day: 2.00    Years: 30.00    Pack years: 60.00    Types: Cigarettes    Last attempt to quit: 03/14/1996    Years since quitting: 21.4  . Smokeless tobacco: Never Used  Substance and Sexual Activity  . Alcohol use: Yes    Comment: one beer per month  . Drug use: No  . Sexual activity: No

## 2017-09-01 ENCOUNTER — Encounter (INDEPENDENT_AMBULATORY_CARE_PROVIDER_SITE_OTHER): Payer: Self-pay | Admitting: Orthopaedic Surgery

## 2017-09-01 ENCOUNTER — Ambulatory Visit (INDEPENDENT_AMBULATORY_CARE_PROVIDER_SITE_OTHER): Payer: Medicare Other | Admitting: Orthopaedic Surgery

## 2017-09-01 VITALS — BP 138/71 | HR 67

## 2017-09-01 DIAGNOSIS — S98131A Complete traumatic amputation of one right lesser toe, initial encounter: Secondary | ICD-10-CM | POA: Insufficient documentation

## 2017-09-01 DIAGNOSIS — Z89421 Acquired absence of other right toe(s): Secondary | ICD-10-CM

## 2017-09-01 NOTE — Progress Notes (Signed)
Post-Op Visit Note   Patient: [name]           Date of Birth: [birth year]           MRN: [id number] Visit Date: 09/01/2017 PCP: Fraser Din, PA-C   Assessment & Plan: Follow-up great toe amputation on the right.  Incision is no longer draining.  We will defer suture removal.  Continue foot elevation for his venous stasis problems.  He has Victoza at home in the refrigerator and we discussed starting to use it as instructed.  We discussed the importance of elevation of his foot for the swelling and I will recheck him again in 3 weeks.  We will consider suture removal at that time.  Chief Complaint:  Chief Complaint  Patient presents with  . Right Great Toe - Routine Post Op   Visit Diagnoses:  1. Amputated toe of right foot (HCC)     Plan: Continue elevation recheck 3 weeks.  Follow-Up Instructions: No Follow-up on file.   Orders:  No orders of the defined types were placed in this encounter.  No orders of the defined types were placed in this encounter.   Imaging: No results found.  PMFS History: Patient Active Problem List   Diagnosis Date Noted  . CKD stage 3 due to type 2 diabetes mellitus (HCC)   . Diabetic ulcer of right great toe (HCC) 07/22/2017  . Osteomyelitis of toe of right foot (HCC) 07/22/2017  . Foot ulcer, right (HCC) 06/19/2017  . Leukocytosis 09/26/2016  . Chronic ulcer of left foot (HCC) 09/26/2016  . Abnormal nuclear stress test 09/04/2016  . Exertional angina (HCC)   . Acute blood loss anemia 08/21/2016  . BRBPR (bright red blood per rectum) 08/21/2016  . HTN (hypertension) 08/21/2016  . DM2 (diabetes mellitus, type 2) (HCC) 08/21/2016  . Epistaxis 06/12/2016  . Neck pain on left side 06/12/2016  . Chronic venous insufficiency 10/01/2015  . Respiratory distress 09/19/2013  . GERD (gastroesophageal reflux disease) 09/05/2013  . Pain in limb 11/30/2012  . Varicose veins of lower extremities with other complications 11/30/2012  . COPD  (chronic obstructive pulmonary disease) (HCC) 12/12/2010  . OSA (obstructive sleep apnea) 12/12/2010  . Rheumatoid arthritis(714.0) 12/12/2010  . Intercostal pain 10/04/2010  . High risk medications (not anticoagulants) long-term use 10/04/2010  . Dyspnea 10/04/2010  . PAF (paroxysmal atrial fibrillation) (HCC)   . Chronic anticoagulation   . Chronic chest pain   . Obesity    Past Medical History:  Diagnosis Date  . Chronic anticoagulation   . Chronic back pain   . Chronic chest pain   . Chronic venous insufficiency   . Diabetes mellitus without complication (HCC)   . Emphysema   . HTN (hypertension)   . Obesity   . PAF (paroxysmal atrial fibrillation) (HCC)   . Pneumothorax   . Rheumatoid arthritis(714.0)     Family History  Problem Relation Age of Onset  . Other Father        CEREBRAL THROMBOSIS  . Rheum arthritis Mother   . Bone cancer Mother   . Heart failure Brother 50  . Coronary artery disease Brother 42       PTCA  . ALS Sister   . Rheum arthritis Brother   . Rheum arthritis Brother     Past Surgical History:  Procedure Laterality Date  . ablation    . AMPUTATION Right 07/23/2017   Procedure: right great toe amputation;  Surgeon: Eldred Manges,  MD;  Location: [facility];  Service: Orthopedics;  Laterality: Right;  . CARDIAC CATHETERIZATION  1998   NORMAL  . COLONOSCOPY WITH PROPOFOL N/A 08/25/2016   Procedure: COLONOSCOPY WITH PROPOFOL;  Surgeon: Carman Ching, MD;  Location: [facility];  Service: Endoscopy;  Laterality: N/A;  . HERNIA REPAIR    . LEFT HEART CATH AND CORONARY ANGIOGRAPHY N/A 10/17/2016   Procedure: Left Heart Cath and Coronary Angiography;  Surgeon: Peter M Swaziland, MD;  Location: [facility] INVASIVE CV LAB;  Service: Cardiovascular;  Laterality: N/A;  . PLEURAL SCARIFICATION    . VEIN LIGATION AND STRIPPING     Social History   Occupational History  . Occupation: Occupational psychologist: [employer]  Tobacco Use  . Smoking status: Former Smoker     Packs/day: 2.00    Years: 30.00    Pack years: 60.00    Types: Cigarettes    Last attempt to quit: 03/14/1996    Years since quitting: 21.4  . Smokeless tobacco: Never Used  Substance and Sexual Activity  . Alcohol use: Yes    Comment: one beer per month  . Drug use: No  . Sexual activity: No

## 2017-09-10 ENCOUNTER — Telehealth (INDEPENDENT_AMBULATORY_CARE_PROVIDER_SITE_OTHER): Payer: Self-pay

## 2017-09-10 NOTE — Telephone Encounter (Signed)
Pt states below his stitches his skin is starting to split open some. "Clearish yellow liquid" coming out. Keeping sock on it and washing it daily. Wants to know if there is anything else he should be doing?

## 2017-09-10 NOTE — Telephone Encounter (Signed)
I called patient and advised to elevate. Working him in to schedule to see Dr. Ophelia Charter tomorrow afternoon at 3pm in [place] office.  Patient aware.  Could you please add to the schedule?

## 2017-09-11 ENCOUNTER — Encounter (INDEPENDENT_AMBULATORY_CARE_PROVIDER_SITE_OTHER): Payer: Self-pay | Admitting: Orthopaedic Surgery

## 2017-09-11 ENCOUNTER — Ambulatory Visit (INDEPENDENT_AMBULATORY_CARE_PROVIDER_SITE_OTHER): Payer: Medicare Other | Admitting: Orthopaedic Surgery

## 2017-09-11 VITALS — BP 138/67 | HR 62

## 2017-09-11 DIAGNOSIS — M869 Osteomyelitis, unspecified: Secondary | ICD-10-CM

## 2017-09-11 MED ORDER — DOXYCYCLINE HYCLATE 100 MG PO TABS: 100.0000 mg | ORAL_TABLET | Freq: Two times a day (BID) | ORAL | 0 refills | Status: DC

## 2017-09-11 NOTE — Progress Notes (Signed)
Post-Op Visit Note   Patient: [name]           Date of Birth: Sep 06, 1945           MRN: [id number] Visit Date: 09/11/2017 PCP: Fraser Din, PA-C   Assessment & Plan: Patient states he noticed a little bit of purulent drainage that he squeezed out.  Since that time is gotten better he applied some powder seem to have dried it up.  He did finish off his antibiotics.  Medial aspect of the great toe amputation is healed and we harvested half the sutures along the medial aspect.  He will continue to keep his foot elevated and with his history of having a little bit of purulent drainage we will reorder his doxycycline.  Recheck 2 weeks as already scheduled.  Chief Complaint:  Chief Complaint  Patient presents with  . Right Great Toe - Wound Check   Visit Diagnoses:  1. Osteomyelitis of toe of right foot (HCC)        Postop right great toe amputation for osteomyelitis. Plan: Continue doxycycline.  Recheck 313/19 as scheduled.  Continue elevation.  Follow-Up Instructions: Return in about 2 weeks (around 09/25/2017).   Orders:  No orders of the defined types were placed in this encounter.  Meds ordered this encounter  Medications  . doxycycline (VIBRA-TABS) 100 MG tablet    Sig: Take 1 tablet (100 mg total) by mouth every 12 (twelve) hours.    Dispense:  20 tablet    Refill:  0    Imaging: No results found.  PMFS History: Patient Active Problem List   Diagnosis Date Noted  . Amputated toe of right foot (HCC) 09/01/2017  . CKD stage 3 due to type 2 diabetes mellitus (HCC)   . Diabetic ulcer of right great toe (HCC) 07/22/2017  . Osteomyelitis of toe of right foot (HCC) 07/22/2017  . Foot ulcer, right (HCC) 06/19/2017  . Leukocytosis 09/26/2016  . Chronic ulcer of left foot (HCC) 09/26/2016  . Abnormal nuclear stress test 09/04/2016  . Exertional angina (HCC)   . Acute blood loss anemia 08/21/2016  . BRBPR (bright red blood per rectum) 08/21/2016  . HTN  (hypertension) 08/21/2016  . DM2 (diabetes mellitus, type 2) (HCC) 08/21/2016  . Epistaxis 06/12/2016  . Neck pain on left side 06/12/2016  . Chronic venous insufficiency 10/01/2015  . Respiratory distress 09/19/2013  . GERD (gastroesophageal reflux disease) 09/05/2013  . Pain in limb 11/30/2012  . Varicose veins of lower extremities with other complications 11/30/2012  . COPD (chronic obstructive pulmonary disease) (HCC) 12/12/2010  . OSA (obstructive sleep apnea) 12/12/2010  . Rheumatoid arthritis(714.0) 12/12/2010  . Intercostal pain 10/04/2010  . High risk medications (not anticoagulants) long-term use 10/04/2010  . Dyspnea 10/04/2010  . PAF (paroxysmal atrial fibrillation) (HCC)   . Chronic anticoagulation   . Chronic chest pain   . Obesity    Past Medical History:  Diagnosis Date  . Chronic anticoagulation   . Chronic back pain   . Chronic chest pain   . Chronic venous insufficiency   . Diabetes mellitus without complication (HCC)   . Emphysema   . HTN (hypertension)   . Obesity   . PAF (paroxysmal atrial fibrillation) (HCC)   . Pneumothorax   . Rheumatoid arthritis(714.0)     Family History  Problem Relation Age of Onset  . Other Father        CEREBRAL THROMBOSIS  . Rheum arthritis Mother   .  Bone cancer Mother   . Heart failure Brother 50  . Coronary artery disease Brother 88       PTCA  . ALS Sister   . Rheum arthritis Brother   . Rheum arthritis Brother     Past Surgical History:  Procedure Laterality Date  . ablation    . AMPUTATION Right 07/23/2017   Procedure: right great toe amputation;  Surgeon: Eldred Manges, MD;  Location: [facility];  Service: Orthopedics;  Laterality: Right;  . CARDIAC CATHETERIZATION  1998   NORMAL  . COLONOSCOPY WITH PROPOFOL N/A 08/25/2016   Procedure: COLONOSCOPY WITH PROPOFOL;  Surgeon: Carman Ching, MD;  Location: [facility];  Service: Endoscopy;  Laterality: N/A;  . HERNIA REPAIR    . LEFT HEART CATH AND CORONARY  ANGIOGRAPHY N/A 10/17/2016   Procedure: Left Heart Cath and Coronary Angiography;  Surgeon: Peter M Swaziland, MD;  Location: [facility] INVASIVE CV LAB;  Service: Cardiovascular;  Laterality: N/A;  . PLEURAL SCARIFICATION    . VEIN LIGATION AND STRIPPING     Social History   Occupational History  . Occupation: Occupational psychologist: [employer]  Tobacco Use  . Smoking status: Former Smoker    Packs/day: 2.00    Years: 30.00    Pack years: 60.00    Types: Cigarettes    Last attempt to quit: 03/14/1996    Years since quitting: 21.5  . Smokeless tobacco: Never Used  Substance and Sexual Activity  . Alcohol use: Yes    Comment: one beer per month  . Drug use: No  . Sexual activity: No

## 2017-09-11 NOTE — Addendum Note (Signed)
Addended by: Rogers Seeds on: 09/11/2017 04:21 PM   Modules accepted: Orders

## 2017-09-13 ENCOUNTER — Other Ambulatory Visit: Payer: Self-pay | Admitting: Cardiology

## 2017-09-15 ENCOUNTER — Other Ambulatory Visit: Payer: Self-pay | Admitting: Cardiology

## 2017-09-17 ENCOUNTER — Telehealth: Payer: Self-pay

## 2017-09-17 ENCOUNTER — Other Ambulatory Visit: Payer: Self-pay | Admitting: Hematology and Oncology

## 2017-09-17 ENCOUNTER — Inpatient Hospital Stay: Payer: Medicare Other | Attending: Hematology and Oncology

## 2017-09-17 DIAGNOSIS — D500 Iron deficiency anemia secondary to blood loss (chronic): Secondary | ICD-10-CM | POA: Diagnosis not present

## 2017-09-17 DIAGNOSIS — K625 Hemorrhage of anus and rectum: Secondary | ICD-10-CM | POA: Diagnosis not present

## 2017-09-17 DIAGNOSIS — D62 Acute posthemorrhagic anemia: Secondary | ICD-10-CM

## 2017-09-17 LAB — CBC WITH DIFFERENTIAL/PLATELET
Basophils Absolute: 0.2 10*3/uL — ABNORMAL HIGH (ref 0.0–0.1)
Basophils Relative: 1 %
EOS PCT: 3 %
Eosinophils Absolute: 0.6 10*3/uL — ABNORMAL HIGH (ref 0.0–0.5)
HEMATOCRIT: 41.9 % (ref 38.4–49.9)
HEMOGLOBIN: 13.4 g/dL (ref 13.0–17.1)
LYMPHS ABS: 2.8 10*3/uL (ref 0.9–3.3)
LYMPHS PCT: 15 %
MCH: 28.9 pg (ref 27.2–33.4)
MCHC: 32 g/dL (ref 32.0–36.0)
MCV: 90.5 fL (ref 79.3–98.0)
Monocytes Absolute: 1.9 10*3/uL — ABNORMAL HIGH (ref 0.1–0.9)
Monocytes Relative: 10 %
NEUTROS PCT: 71 %
NRBC: 1 /100{WBCs} — AB
Neutro Abs: 13 10*3/uL — ABNORMAL HIGH (ref 1.5–6.5)
Platelets: 124 10*3/uL — ABNORMAL LOW (ref 140–400)
RBC: 4.63 MIL/uL (ref 4.20–5.82)
RDW: 18.7 % — ABNORMAL HIGH (ref 11.0–14.6)
WBC: 18.5 10*3/uL — AB (ref 4.0–10.3)

## 2017-09-17 LAB — IRON AND TIBC
Iron: 62 ug/dL (ref 42–163)
Saturation Ratios: 18 % — ABNORMAL LOW (ref 42–163)
TIBC: 344 ug/dL (ref 202–409)
UIBC: 281 ug/dL

## 2017-09-17 LAB — FERRITIN: Ferritin: 68 ng/mL (ref 22–316)

## 2017-09-17 NOTE — Telephone Encounter (Signed)
Called and given below message. Verbalized understanding. He states that he will follow up with his PCP.

## 2017-09-17 NOTE — Telephone Encounter (Signed)
-----   Message from Artis Delay, MD sent at 09/17/2017  3:36 PM EST ----- Regarding: labs pls let him know he is not anemic. Iron study is good Recommend recheck in 3 months here or with PCP If he wants it here, pls send scheduling msg ----- Message ----- From: Interface, Lab In [place] Sent: 09/17/2017  12:02 PM To: Artis Delay, MD

## 2017-09-21 NOTE — Progress Notes (Signed)
History of Present Illness: [name] is seen today for follow up PAfib. He has a history of recurrent paroxysmal atrial fibrillation. He has been on chronic amiodarone therapy since September of 2011.    He also has a history of chronic chest pain with extensive evaluation in the past including cardiac cath, CT, and stress tests. Last stress Myoview was in 2012. He has a history of chronic venous stasis disease and had prior vein stripping .  He was admitted  in February 2018 with iron deficiency anemia and BRBPR. Hgb dropped to 8 and remained stable. He had recently been switched from coumadin to Xarelto. Colonoscopy showed internal hemorrhoids and tics but no active bleeding. He was given Fe transfusion. He complained of chest tightness and a Myoview study was done showing a small area of apical ischemia. EF 60%. This was felt to be low risk. Echo was unremarkable. Xarelto was resumed at DC.  After his Hgb had stabilized we proceeded with Cardiac cath on 10/17/16. This showed no CAD and normal LV function. LVEDP was mildly elevated. Medical therapy recommended.   He was admitted in January 2019 with infection and osteomyelitis of the right great toe. He underwent amputation of the toe and received antibiotics for 3 months. ABI was normal. Followed closely by Dr. Ophelia Charter. Patient notes he may require further amputation since the wound will not heal.   He is doing well from a cardiac standpoint. Denies any current chest pain or dyspnea. No palpitations.  He is planning on moving to the [facility] area in the next couple of years.   Current Outpatient Medications on File Prior to Visit  Medication Sig Dispense Refill  . amiodarone (PACERONE) 200 MG tablet TAKE 1 TABLET BY MOUTH  DAILY 90 tablet 1  . ANORO ELLIPTA 62.5-25 MCG/INH AEPB INHALE 1 PUFF INTO THE LUNGS DAILY. 60 each 5  . atenolol (TENORMIN) 50 MG tablet Take 0.5 tablets (25 mg total) by mouth 2 (two) times daily.    Marland Kitchen diltiazem (CARDIZEM CD) 180  MG 24 hr capsule TAKE 1 CAPSULE BY MOUTH  DAILY 90 capsule 1  . doxycycline (VIBRA-TABS) 100 MG tablet Take 1 tablet (100 mg total) by mouth every 12 (twelve) hours. 20 tablet 0  . furosemide (LASIX) 40 MG tablet TAKE 1 TABLET BY MOUTH  DAILY 90 tablet 1  . hydrochlorothiazide (HYDRODIURIL) 25 MG tablet TAKE 1 TABLET BY MOUTH  DAILY 90 tablet 3  . HYDROcodone-acetaminophen (NORCO) 5-325 MG tablet Take 1 tablet by mouth 2 (two) times daily as needed for moderate pain. 20 tablet 0  . metFORMIN (GLUCOPHAGE-XR) 500 MG 24 hr tablet Take 1 tablet by mouth 2 (two) times daily.    Marland Kitchen omeprazole (PRILOSEC) 20 MG capsule TAKE 1 CAPSULE BY MOUTH  DAILY 90 capsule 1  . oxyCODONE (ROXICODONE) 5 MG immediate release tablet Take 1 tablet (5 mg total) by mouth every 6 (six) hours as needed for severe pain. 20 tablet 0  . potassium chloride SA (K-DUR,KLOR-CON) 20 MEQ tablet Take 1 tablet (20 mEq total) by mouth 2 (two) times daily. NEED OV. 180 tablet 0  . PROAIR HFA 108 (90 Base) MCG/ACT inhaler INHALE TWO PUFFS BY MOUTH EVERY 4 HOURS AS NEEDED FOR WHEEZING AND  SHORTNESS OF BREATH 8.5 each 2  . spironolactone (ALDACTONE) 25 MG tablet TAKE 1 TABLET BY MOUTH  DAILY 90 tablet 3  . VICTOZA 18 MG/3ML SOPN Inject 0.6-1.2 mg into the skin daily. Inject 0.6 mg [place] daily for  1 week then increase to 1.2 mg [place] daily    . XARELTO 20 MG TABS tablet TAKE ONE TABLET BY MOUTH DAILY WITH SUPPER 30 tablet 1   No current facility-administered medications on file prior to visit.     No Known Allergies  Past Medical History:  Diagnosis Date  . Chronic anticoagulation   . Chronic back pain   . Chronic chest pain   . Chronic venous insufficiency   . Diabetes mellitus without complication (HCC)   . Emphysema   . HTN (hypertension)   . Obesity   . PAF (paroxysmal atrial fibrillation) (HCC)   . Pneumothorax   . Rheumatoid arthritis(714.0)     Past Surgical History:  Procedure Laterality Date  . ablation    . AMPUTATION  Right 07/23/2017   Procedure: right great toe amputation;  Surgeon: Eldred Manges, MD;  Location: [facility];  Service: Orthopedics;  Laterality: Right;  . CARDIAC CATHETERIZATION  1998   NORMAL  . COLONOSCOPY WITH PROPOFOL N/A 08/25/2016   Procedure: COLONOSCOPY WITH PROPOFOL;  Surgeon: Carman Ching, MD;  Location: [facility];  Service: Endoscopy;  Laterality: N/A;  . HERNIA REPAIR    . LEFT HEART CATH AND CORONARY ANGIOGRAPHY N/A 10/17/2016   Procedure: Left Heart Cath and Coronary Angiography;  Surgeon: Peter M Swaziland, MD;  Location: [facility] INVASIVE CV LAB;  Service: Cardiovascular;  Laterality: N/A;  . PLEURAL SCARIFICATION    . VEIN LIGATION AND STRIPPING      Social History   Tobacco Use  Smoking Status Former Smoker  . Packs/day: 2.00  . Years: 30.00  . Pack years: 60.00  . Types: Cigarettes  . Last attempt to quit: 03/14/1996  . Years since quitting: 21.5  Smokeless Tobacco Never Used    Social History   Substance and Sexual Activity  Alcohol Use Yes   Comment: one beer per month    Family History  Problem Relation Age of Onset  . Other Father        CEREBRAL THROMBOSIS  . Rheum arthritis Mother   . Bone cancer Mother   . Heart failure Brother 50  . Coronary artery disease Brother 28       PTCA  . ALS Sister   . Rheum arthritis Brother   . Rheum arthritis Brother     Review of Systems: As noted in history of present illness. All other systems were reviewed and are negative.  Physical Exam: BP (!) 136/58   Pulse 63   Ht 6\' 6"  (1.981 m)   Wt (!) 340 lb (154.2 kg)   BMI 39.29 kg/m  GENERAL:  Well appearing WM in NAD HEENT:  PERRL, EOMI, sclera are clear. Oropharynx is clear. NECK:  No jugular venous distention, carotid upstroke brisk and symmetric, no bruits, no thyromegaly or adenopathy LUNGS:  Clear to auscultation bilaterally CHEST:  Unremarkable HEART:  RRR,  PMI not displaced or sustained,S1 and S2 within normal limits, no S3, no S4: no clicks, no rubs, no  murmurs ABD:  Soft, nontender. BS +, no masses or bruits. No hepatomegaly, no splenomegaly EXT:  2 + pulses throughout, chronic venous stasis with support hose in place.  SKIN:  Warm and dry.  No rashes NEURO:  Alert and oriented x 3. Cranial nerves II through XII intact. PSYCH:  Cognitively intact    Laboratory data:   Lab Results  Component Value Date   WBC 18.5 (H) 09/17/2017   HGB 13.4 09/17/2017   HCT 41.9 09/17/2017  PLT 124 (L) 09/17/2017   GLUCOSE 209 (H) 07/23/2017   CHOL 163 09/24/2015   TRIG 174 (H) 09/24/2015   HDL 29 (L) 09/24/2015   LDLCALC 99 09/24/2015   ALT 15 (L) 07/22/2017   AST 21 07/22/2017   NA 134 (L) 07/23/2017   K 3.3 (L) 07/23/2017   CL 100 (L) 07/23/2017   CREATININE 1.48 (H) 07/23/2017   BUN 21 (H) 07/23/2017   CO2 26 07/23/2017   TSH 3.51 09/24/2015   INR 1.35 07/22/2017   HGBA1C 7.6 (H) 07/23/2017    Reviewed from primary care dated 08/20/16: A1c 9.1% CMET normal.   Ecg today shows NSR with rate 63. LAFB. No acute change. QTc 470 msec. I have personally reviewed and interpreted this study.  Assessment / Plan: 1. Paroxysmal atrial fibrillation. This is well controlled on amiodarone. We will continue amiodarone 100 mg daily and atenolol. On Xarelto for anticoagulation.   2. Anticoagulation. on Xarelto.  3. Morbid obesity with sleep apnea. Continue efforts at weight loss.   4. Chronic venous insufficiency with  chronic edema.  Continue diuretic therapy with Lasix, HCTZ, and Aldactone.   5. COPD/emphysema.   6. Chest pain.  Long history of atypical chest pain in the past. Symptoms exacerbated by anemia.  Myoview showed small area of apical ischemia. EF normal. Cardiac cath showed no significant CAD. Patient reassured.   7. S/p right great toe amputation for osteomyelitis. May require further surgery.

## 2017-09-23 ENCOUNTER — Ambulatory Visit (INDEPENDENT_AMBULATORY_CARE_PROVIDER_SITE_OTHER): Payer: Medicare Other

## 2017-09-23 ENCOUNTER — Ambulatory Visit (INDEPENDENT_AMBULATORY_CARE_PROVIDER_SITE_OTHER): Payer: Medicare Other | Admitting: Orthopaedic Surgery

## 2017-09-23 ENCOUNTER — Encounter (INDEPENDENT_AMBULATORY_CARE_PROVIDER_SITE_OTHER): Payer: Self-pay | Admitting: Orthopaedic Surgery

## 2017-09-23 VITALS — BP 133/59 | HR 58 | Ht 78.0 in | Wt 350.0 lb

## 2017-09-23 DIAGNOSIS — M79671 Pain in right foot: Secondary | ICD-10-CM

## 2017-09-23 NOTE — Progress Notes (Addendum)
Post-Op Visit Note   Patient: [name]           Date of Birth: 09/09/1945           MRN: [id number] Visit Date: 09/23/2017 PCP: Fraser Din, PA-C   Assessment & Plan: We will order an MRI scan right foot due to his new finding on the second metatarsal head which looks more like stress fracture that then displaced with his neuropathy than osteomyelitis.  MRI scan will give additional information.  Patient's white count was 22.4K at time of surgery and then dropped to 16 K and is gone back up to 18.5 K.  Sedimentation rate is gradually decreasing is gone from 73-63-48 last on 3/7.  Patient uses walker minimal weight-bear keep his foot elevated more get back in his Darco shoe to protect the second metatarsal head fracture.  Follow-up after MRI scan.  Chief Complaint:  Chief Complaint  Patient presents with  . Right Foot - Routine Post Op   Visit Diagnoses:  1. Pain in right foot     Plan: Patient had some increased swelling in his foot.  Follow-Up Instructions: No follow-ups on file.   Orders:  Orders Placed This Encounter  Procedures  . XR Foot Complete Right  . MR Foot Right w/o contrast   No orders of the defined types were placed in this encounter.   Imaging: Three-view x-rays left foot obtained and reviewed.  No obvious osteomyelitis.  Previous great toe amputation.  Shortening callus formation around the second metatarsal neck more consistent with second metatarsal head/neck fracture than osteomyelitis.  MRI recommended.  MRI scan ordered.   PMFS History: Patient Active Problem List   Diagnosis Date Noted  . Amputated toe of right foot (HCC) 09/01/2017  . CKD stage 3 due to type 2 diabetes mellitus (HCC)   . Diabetic ulcer of right great toe (HCC) 07/22/2017  . Osteomyelitis of toe of right foot (HCC) 07/22/2017  . Foot ulcer, right (HCC) 06/19/2017  . Leukocytosis 09/26/2016  . Chronic ulcer of left foot (HCC) 09/26/2016  . Abnormal nuclear stress test  09/04/2016  . Exertional angina (HCC)   . Acute blood loss anemia 08/21/2016  . BRBPR (bright red blood per rectum) 08/21/2016  . HTN (hypertension) 08/21/2016  . DM2 (diabetes mellitus, type 2) (HCC) 08/21/2016  . Epistaxis 06/12/2016  . Neck pain on left side 06/12/2016  . Chronic venous insufficiency 10/01/2015  . Respiratory distress 09/19/2013  . GERD (gastroesophageal reflux disease) 09/05/2013  . Pain in limb 11/30/2012  . Varicose veins of lower extremities with other complications 11/30/2012  . COPD (chronic obstructive pulmonary disease) (HCC) 12/12/2010  . OSA (obstructive sleep apnea) 12/12/2010  . Rheumatoid arthritis(714.0) 12/12/2010  . Intercostal pain 10/04/2010  . High risk medications (not anticoagulants) long-term use 10/04/2010  . Dyspnea 10/04/2010  . PAF (paroxysmal atrial fibrillation) (HCC)   . Chronic anticoagulation   . Chronic chest pain   . Obesity    Past Medical History:  Diagnosis Date  . Chronic anticoagulation   . Chronic back pain   . Chronic chest pain   . Chronic venous insufficiency   . Diabetes mellitus without complication (HCC)   . Emphysema   . HTN (hypertension)   . Obesity   . PAF (paroxysmal atrial fibrillation) (HCC)   . Pneumothorax   . Rheumatoid arthritis(714.0)     Family History  Problem Relation Age of Onset  . Other Father  CEREBRAL THROMBOSIS  . Rheum arthritis Mother   . Bone cancer Mother   . Heart failure Brother 50  . Coronary artery disease Brother 68       PTCA  . ALS Sister   . Rheum arthritis Brother   . Rheum arthritis Brother     Past Surgical History:  Procedure Laterality Date  . ablation    . AMPUTATION Right 07/23/2017   Procedure: right great toe amputation;  Surgeon: Eldred Manges, MD;  Location: [facility];  Service: Orthopedics;  Laterality: Right;  . CARDIAC CATHETERIZATION  1998   NORMAL  . COLONOSCOPY WITH PROPOFOL N/A 08/25/2016   Procedure: COLONOSCOPY WITH PROPOFOL;  Surgeon: Carman Ching, MD;  Location: [facility];  Service: Endoscopy;  Laterality: N/A;  . HERNIA REPAIR    . LEFT HEART CATH AND CORONARY ANGIOGRAPHY N/A 10/17/2016   Procedure: Left Heart Cath and Coronary Angiography;  Surgeon: Peter M Swaziland, MD;  Location: [facility] INVASIVE CV LAB;  Service: Cardiovascular;  Laterality: N/A;  . PLEURAL SCARIFICATION    . VEIN LIGATION AND STRIPPING     Social History   Occupational History  . Occupation: Occupational psychologist: [employer]  Tobacco Use  . Smoking status: Former Smoker    Packs/day: 2.00    Years: 30.00    Pack years: 60.00    Types: Cigarettes    Last attempt to quit: 03/14/1996    Years since quitting: 21.5  . Smokeless tobacco: Never Used  Substance and Sexual Activity  . Alcohol use: Yes    Comment: one beer per month  . Drug use: No  . Sexual activity: Never

## 2017-09-23 NOTE — Addendum Note (Signed)
Addended by: Rogers Seeds on: 09/23/2017 11:36 AM   Modules accepted: Orders

## 2017-09-25 ENCOUNTER — Encounter: Payer: Self-pay | Admitting: Cardiology

## 2017-09-25 ENCOUNTER — Ambulatory Visit: Payer: Medicare Other | Admitting: Cardiology

## 2017-09-25 VITALS — BP 136/58 | HR 63 | Ht 78.0 in | Wt 340.0 lb

## 2017-09-25 DIAGNOSIS — G8929 Other chronic pain: Secondary | ICD-10-CM | POA: Diagnosis not present

## 2017-09-25 DIAGNOSIS — Z7901 Long term (current) use of anticoagulants: Secondary | ICD-10-CM | POA: Diagnosis not present

## 2017-09-25 DIAGNOSIS — E119 Type 2 diabetes mellitus without complications: Secondary | ICD-10-CM | POA: Diagnosis not present

## 2017-09-25 DIAGNOSIS — R079 Chest pain, unspecified: Secondary | ICD-10-CM | POA: Diagnosis not present

## 2017-09-25 DIAGNOSIS — I480 Paroxysmal atrial fibrillation: Secondary | ICD-10-CM | POA: Diagnosis not present

## 2017-09-25 NOTE — Patient Instructions (Addendum)
Continue your current therapy  I will see you in 6-8 months. 

## 2017-10-06 ENCOUNTER — Ambulatory Visit
Admission: RE | Admit: 2017-10-06 | Discharge: 2017-10-06 | Disposition: A | Discharge status: Home / Self Care | Payer: Medicare Other | Source: Ambulatory Visit | Attending: Orthopaedic Surgery | Admitting: Orthopaedic Surgery

## 2017-10-06 DIAGNOSIS — M79671 Pain in right foot: Secondary | ICD-10-CM

## 2017-10-07 ENCOUNTER — Encounter (INDEPENDENT_AMBULATORY_CARE_PROVIDER_SITE_OTHER): Payer: Self-pay | Admitting: Orthopaedic Surgery

## 2017-10-07 ENCOUNTER — Ambulatory Visit (INDEPENDENT_AMBULATORY_CARE_PROVIDER_SITE_OTHER): Payer: Medicare Other | Admitting: Orthopaedic Surgery

## 2017-10-07 VITALS — BP 140/63 | HR 64

## 2017-10-07 DIAGNOSIS — M84374A Stress fracture, right foot, initial encounter for fracture: Secondary | ICD-10-CM

## 2017-10-07 DIAGNOSIS — S92321A Displaced fracture of second metatarsal bone, right foot, initial encounter for closed fracture: Secondary | ICD-10-CM

## 2017-10-07 NOTE — Progress Notes (Signed)
Office Visit Note   Patient: [name]           Date of Birth: July 05, 1946           MRN: [id number] Visit Date: 10/07/2017              Requested by: Fraser Din, PA-C [address] [place], [place] [address] PCP: Fraser Din, PA-C   Assessment & Plan: Visit Diagnoses:  1. Closed displaced fracture of second metatarsal bone of right foot, initial encounter   2. Stress fracture of metatarsal bone of right foot, initial encounter     Plan: Post great toe amputation for infection.  He now has second metatarsal impacted fracture stress reaction in the third and subtle early stress reaction is fourth metatarsal.  He will obtain a knee roller and use it.  He will keep his Darco shoe on and not wear his tennis shoe.  We discussed the potential for displacement of metatarsal fractures with loss of foot architecture potential ulceration problems with infection and that this could progress to the point where he needs a below-knee amputation.  Work on weight loss keep his foot elevated use his Darco shoe, diet watch his diabetes carefully and obtain a knee roller.  Plan to recheck him in 1 month.  Single AP x-ray of his foot on return, right.  Pathophysiology of the condition was discussed and outlined treatment plan discussed.  Follow-Up Instructions: No follow-ups on file.   Orders:  No orders of the defined types were placed in this encounter.  No orders of the defined types were placed in this encounter.     Procedures: No procedures performed   Clinical Data: No additional findings.   Subjective: Chief Complaint  Patient presents with  . Right Foot - Follow-up    MRI review    HPI 72 year old male returns post right great toe amputation.  Last 2 sutures were removed today.  Due to persistent swelling of his foot an MRI scan has been obtained.  This shows nondisplaced impacted fracture of the second metatarsal neck with marrow edema.  Severe marrow edema third  metatarsal shaft consistent with stress reaction.  Subtle marrow edema in the fourth metatarsal.  Fifth metatarsal is normal.  Review of Systems updated and unchanged from surgery January this year other than as mentioned in HPI.   Objective: Vital Signs: BP 140/63   Pulse 64   Physical Exam  Constitutional: He is oriented to person, place, and time. He appears well-developed and well-nourished.  HENT:  Head: Normocephalic and atraumatic.  Eyes: Pupils are equal, round, and reactive to light. EOM are normal.  Neck: No tracheal deviation present. No thyromegaly present.  Cardiovascular: Normal rate.  Pulmonary/Chest: Effort normal. He has no wheezes.  Abdominal: Soft. Bowel sounds are normal.  Neurological: He is alert and oriented to person, place, and time.  Skin: Skin is warm and dry. Capillary refill takes less than 2 seconds.  Psychiatric: He has a normal mood and affect. His behavior is normal. Judgment and thought content normal.    Ortho Exam patient has forefoot edema.  No drainage.  No plantar foot ulcers.  Patient has neuropathy decreased sensation.  Specialty Comments:  No specialty comments available.  Imaging: Mr Foot Right W/o Contrast  Result Date: 10/07/2017 CLINICAL DATA:  Right great toe was amputated for infection February 2019. Patient presents for evaluation of persistent pain with possible second metatarsal stress fracture. EXAM: MRI OF THE RIGHT  FOREFOOT WITHOUT CONTRAST TECHNIQUE: Multiplanar, multisequence MR imaging of the right forefoot was performed. No intravenous contrast was administered. COMPARISON:  None. FINDINGS: Bones/Joint/Cartilage Interval amputation of first proximal and distal phalanx with postsurgical changes at the amputation site. Subchondral reactive marrow edema in the plantar medial aspect of first metatarsal head at the articulation with the medial hallux sesamoid. Moderate joint effusion at the articulation of the first metatarsal head  with the medial hallux sesamoid. Nondisplaced impacted fracture of the second metatarsal neck with severe marrow edema in the shaft of the second metatarsal. Severe surrounding soft tissue edema. No cortical destruction to suggest osteomyelitis. Severe marrow edema in the third metatarsal shaft and third metatarsal head concerning for stress reaction with severe surrounding soft tissue edema. Severe marrow edema in the base of the third proximal phalanx with possible subtle linear component most concerning for a nondisplaced stress fracture. Moderate third MTP joint effusion. Subtle marrow edema in the fourth metatarsal head with transverse linear low signal most concerning for a nondisplaced fracture. Marrow edema in the fourth metatarsal shaft concerning for stress reaction. Small fourth MTP joint effusion. Normal alignment.  No aggressive osseous lesion. Ligaments Collateral ligaments are intact.  Lisfranc ligament is intact. Muscles and Tendons Flexor, peroneal and extensor compartment tendons are intact. Mild generalized muscle edema along the plantar and dorsal aspect of the forefoot likely reactive secondary to stress reaction throughout multiple metatarsal bones with an element of underlying neurogenic edema. Soft tissue No fluid collection or hematoma. No soft tissue mass. Generalized soft tissue edema along the dorsal aspect of the forefoot and midfoot likely reactive. IMPRESSION: 1. Nondisplaced impacted fracture of the second metatarsal neck with severe marrow edema in the shaft of the second metatarsal. Severe surrounding soft tissue edema. No cortical destruction to suggest osteomyelitis. 2. Severe marrow edema in the third metatarsal shaft and third metatarsal head concerning for stress reaction with severe surrounding soft tissue edema. Severe marrow edema in the base of the third proximal phalanx with possible subtle linear component most concerning for a nondisplaced stress fracture. Moderate third  MTP joint effusion. 3. Subtle marrow edema in the fourth metatarsal head with transverse linear low signal most concerning for a nondisplaced fracture. Marrow edema in the fourth metatarsal shaft concerning for stress reaction. Small fourth MTP joint effusion. 4. Interval amputation of first proximal and distal phalanx with postsurgical changes at the amputation site. Subchondral reactive marrow edema in the plantar medial aspect of first metatarsal head at the articulation with the medial hallux sesamoid. Moderate joint effusion at the articulation of the first metatarsal head with the medial hallux sesamoid. Electronically Signed   By: Elige Ko   On: 10/07/2017 08:48     PMFS History: Patient Active Problem List   Diagnosis Date Noted  . Amputated toe of right foot (HCC) 09/01/2017  . CKD stage 3 due to type 2 diabetes mellitus (HCC)   . Diabetic ulcer of right great toe (HCC) 07/22/2017  . Osteomyelitis of toe of right foot (HCC) 07/22/2017  . Foot ulcer, right (HCC) 06/19/2017  . Leukocytosis 09/26/2016  . Chronic ulcer of left foot (HCC) 09/26/2016  . Abnormal nuclear stress test 09/04/2016  . Exertional angina (HCC)   . Acute blood loss anemia 08/21/2016  . BRBPR (bright red blood per rectum) 08/21/2016  . HTN (hypertension) 08/21/2016  . DM2 (diabetes mellitus, type 2) (HCC) 08/21/2016  . Epistaxis 06/12/2016  . Neck pain on left side 06/12/2016  . Chronic venous insufficiency 10/01/2015  .  Respiratory distress 09/19/2013  . GERD (gastroesophageal reflux disease) 09/05/2013  . Pain in limb 11/30/2012  . Varicose veins of lower extremities with other complications 11/30/2012  . COPD (chronic obstructive pulmonary disease) (HCC) 12/12/2010  . OSA (obstructive sleep apnea) 12/12/2010  . Rheumatoid arthritis(714.0) 12/12/2010  . Intercostal pain 10/04/2010  . High risk medications (not anticoagulants) long-term use 10/04/2010  . Dyspnea 10/04/2010  . PAF (paroxysmal atrial  fibrillation) (HCC)   . Chronic anticoagulation   . Chronic chest pain   . Obesity    Past Medical History:  Diagnosis Date  . Chronic anticoagulation   . Chronic back pain   . Chronic chest pain   . Chronic venous insufficiency   . Diabetes mellitus without complication (HCC)   . Emphysema   . HTN (hypertension)   . Obesity   . PAF (paroxysmal atrial fibrillation) (HCC)   . Pneumothorax   . Rheumatoid arthritis(714.0)     Family History  Problem Relation Age of Onset  . Other Father        CEREBRAL THROMBOSIS  . Rheum arthritis Mother   . Bone cancer Mother   . Heart failure Brother 50  . Coronary artery disease Brother 45       PTCA  . ALS Sister   . Rheum arthritis Brother   . Rheum arthritis Brother     Past Surgical History:  Procedure Laterality Date  . ablation    . AMPUTATION Right 07/23/2017   Procedure: right great toe amputation;  Surgeon: Eldred Manges, MD;  Location: [facility];  Service: Orthopedics;  Laterality: Right;  . CARDIAC CATHETERIZATION  1998   NORMAL  . COLONOSCOPY WITH PROPOFOL N/A 08/25/2016   Procedure: COLONOSCOPY WITH PROPOFOL;  Surgeon: Carman Ching, MD;  Location: [facility];  Service: Endoscopy;  Laterality: N/A;  . HERNIA REPAIR    . LEFT HEART CATH AND CORONARY ANGIOGRAPHY N/A 10/17/2016   Procedure: Left Heart Cath and Coronary Angiography;  Surgeon: Peter M Swaziland, MD;  Location: [facility] INVASIVE CV LAB;  Service: Cardiovascular;  Laterality: N/A;  . PLEURAL SCARIFICATION    . VEIN LIGATION AND STRIPPING     Social History   Occupational History  . Occupation: Occupational psychologist: [employer]  Tobacco Use  . Smoking status: Former Smoker    Packs/day: 2.00    Years: 30.00    Pack years: 60.00    Types: Cigarettes    Last attempt to quit: 03/14/1996    Years since quitting: 21.5  . Smokeless tobacco: Never Used  Substance and Sexual Activity  . Alcohol use: Yes    Comment: one beer per month  . Drug use: No  . Sexual  activity: Never

## 2017-10-12 ENCOUNTER — Other Ambulatory Visit: Payer: Self-pay | Admitting: Cardiology

## 2017-10-12 NOTE — Telephone Encounter (Signed)
REFILL 

## 2017-10-19 ENCOUNTER — Ambulatory Visit: Payer: Medicare Other | Admitting: Emergency Medicine

## 2017-10-19 ENCOUNTER — Encounter: Payer: Self-pay | Admitting: Emergency Medicine

## 2017-10-19 VITALS — BP 132/76 | HR 67 | Ht 78.0 in | Wt 337.0 lb

## 2017-10-19 DIAGNOSIS — J449 Chronic obstructive pulmonary disease, unspecified: Secondary | ICD-10-CM

## 2017-10-19 DIAGNOSIS — J309 Allergic rhinitis, unspecified: Secondary | ICD-10-CM | POA: Insufficient documentation

## 2017-10-19 DIAGNOSIS — G4733 Obstructive sleep apnea (adult) (pediatric): Secondary | ICD-10-CM | POA: Diagnosis not present

## 2017-10-19 DIAGNOSIS — J301 Allergic rhinitis due to pollen: Secondary | ICD-10-CM | POA: Diagnosis not present

## 2017-10-19 NOTE — Assessment & Plan Note (Signed)
-   Continue zyrtec prn

## 2017-10-19 NOTE — Patient Instructions (Signed)
We will work on getting you a new biPAP machine that provides heated humidity Please continue Anoro as you have been taking it. Keep albuterol available to use 2 puffs if needed for shortness of breath, chest tightness, wheezing. Continue to use your Zyrtec as needed. Follow with Dr Delton Coombes in 3 months or sooner if you have any problems.

## 2017-10-19 NOTE — Assessment & Plan Note (Signed)
Continue Anoro, albuterol prn

## 2017-10-19 NOTE — Assessment & Plan Note (Signed)
He had to stop using his BiPAP about 4 months ago due to nasal burning, dryness.  Apparently his humidifier broke on his machine.  Talked about using nasal saline, saline gel.  I will also try to get him a new device with intact humidifier.  He was on 18/14.

## 2017-10-19 NOTE — Progress Notes (Signed)
  Subjective:    Patient ID: [name], male    DOB: October 02, 1945, 72 y.o.   MRN: [id number]   HPI  ROV 04/16/17 -- Patient has a history of COPD and obstructive sleep apnea on CPAP. Also with rheumatoid arthritis,Diabetes, hypertension, atrial fibrillation on anticoagulation.  He has been rx for Fe deficiency anemia, likely hemorrhoids. He was treated with abx, unsure why or which one. He is on Anoro, tolerates. No flares since last time. He is using albuterol frequently every day. Unsure whether it helps him. Flu shot up to date. Has a lot of dry cough. Occasionally productive. He is on omeprazole. He remains on BiPAP, tolerates it well but has mouth leak  CAT score today 22.   ROV 10/19/17 --22 year old gentleman with a history of obstructive sleep apnea formerly on CPAP, COPD, rheumatoid arthritis, hypertension, atrial fibrillation on anticoagulation, diabetes and iron deficiency anemia.  And ambulatory oximetry at his last visit in October 2018 did not show desaturation although he did had to stop due to fatigue and dyspnea. He had a recent URI / viral syndrome. He is improving now. Uses zyrtec prn. He had a R toe sgy for a chronic infxn. He uses Anoro reliably, uses albuterol 1x a day. He stopped using BiPAP about 4 months  because it was burning his mucous membranes, nose. The humidity on his device broke, no humidity.    Objective:   Physical Exam Vitals:   10/19/17 0947 10/19/17 0948  BP:  132/76  Pulse:  67  SpO2:  96%  Weight: (!) 337 lb (152.9 kg)   Height: 6\' 6"  (1.981 m)    Gen: Pleasant, obese, in no distress,  normal affect  ENT: No lesions,  mouth clear,  oropharynx clear, no postnasal drip  Neck: No JVD, no stridor  Lungs: No use of accessory muscles, no wheeze on regular breath or on forced exp  Cardiovascular: RRR, systolic M, B compression stockings in place.   Musculoskeletal: No deformities, no cyanosis or clubbing  Neuro: alert, non focal  Skin: Warm, no  lesions or rashes   Assessment & Plan:  OSA (obstructive sleep apnea) He had to stop using his BiPAP about 4 months ago due to nasal burning, dryness.  Apparently his humidifier broke on his machine.  Talked about using nasal saline, saline gel.  I will also try to get him a new device with intact humidifier.  He was on 18/14.   COPD (chronic obstructive pulmonary disease) (HCC) Continue Anoro, albuterol prn  Allergic rhinitis Continue zyrtec prn  08-21-1989, MD, PhD 10/19/2017, 10:23 AM [facility] [phone] or if no answer [phone]

## 2017-11-02 ENCOUNTER — Telehealth: Payer: Self-pay

## 2017-11-02 NOTE — Telephone Encounter (Signed)
Received records from [facility] on cover sheet a question asking if any reason patient should not be on a ace or statin.Dr.[name] advised ok to be on both.

## 2017-11-10 ENCOUNTER — Other Ambulatory Visit: Payer: Self-pay | Admitting: Cardiology

## 2017-11-11 NOTE — Telephone Encounter (Signed)
Rx(s) sent to pharmacy electronically.  

## 2017-11-18 ENCOUNTER — Encounter (INDEPENDENT_AMBULATORY_CARE_PROVIDER_SITE_OTHER): Payer: Self-pay | Admitting: Orthopaedic Surgery

## 2017-11-18 ENCOUNTER — Ambulatory Visit (INDEPENDENT_AMBULATORY_CARE_PROVIDER_SITE_OTHER): Payer: Medicare Other

## 2017-11-18 ENCOUNTER — Ambulatory Visit (INDEPENDENT_AMBULATORY_CARE_PROVIDER_SITE_OTHER): Payer: Medicare Other | Admitting: Orthopaedic Surgery

## 2017-11-18 DIAGNOSIS — S92321A Displaced fracture of second metatarsal bone, right foot, initial encounter for closed fracture: Secondary | ICD-10-CM | POA: Diagnosis not present

## 2017-11-18 DIAGNOSIS — M84374A Stress fracture, right foot, initial encounter for fracture: Secondary | ICD-10-CM | POA: Diagnosis not present

## 2017-11-18 NOTE — Progress Notes (Signed)
Office Visit Note   Patient: [name]           Date of Birth: Feb 04, 1946           MRN: [id number] Visit Date: 11/18/2017              Requested by: Fraser Din, PA-C [address] [place] [address] PCP: System, Pcp Not In   Assessment & Plan: Visit Diagnoses:  1. Closed displaced fracture of second metatarsal bone of right foot, initial encounter   2. Stress fracture of metatarsal bone of right foot, initial encounter     Plan: recheck 6 wks prescription for diabetic shoes with Plastizote inserts given.  Follow-Up Instructions: Return in about 6 weeks (around 12/30/2017).   Orders:  Orders Placed This Encounter  Procedures  . XR Foot 2 Views Right   No orders of the defined types were placed in this encounter.     Procedures: No procedures performed   Clinical Data: No additional findings.   Subjective: Chief Complaint  Patient presents with  . 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  He has been nonweightbearing in a cam boot using a knee roller.  He is lost a little bit of weight.  He is going to the beach for vacation shortly and plans to resume diet when he gets back.  Review of Systems 14 point review of systems updated unchanged since his surgery in January 2019 other than mentioned in HPI.   Objective: Vital Signs: There were no vitals taken for this visit.  Physical Exam  Constitutional: He is oriented to person, place, and time. He appears well-developed and well-nourished.  HENT:  Head: Normocephalic and atraumatic.  Eyes: Pupils are equal, round, and reactive to light. EOM are normal.  Neck: No tracheal deviation present. No thyromegaly present.  Cardiovascular: Normal rate.  Pulmonary/Chest: Effort normal. He has no wheezes.  Abdominal: Soft. Bowel sounds are normal.    Neurological: He is alert and oriented to person, place, and time.  Skin: Skin is warm and dry. Capillary refill takes less than 2 seconds.  Psychiatric: He has a normal mood and affect. His behavior is normal. Judgment and thought content normal.    Ortho Exam swelling is down in his right forefoot.  Incision from first ray amputation is healed.  No plantar foot lesions.  Skin over the heel is normal.  Specialty Comments:  No specialty comments available.  Imaging: Xr Foot 2 Views Right  Result Date: 11/18/2017 Three-view x-rays right foot obtained and reviewed.  Patient had first ray amputation.  Second metatarsal neck and head fracture has healed with satisfactory callus formation.  Irregularity and osteopenia of the third and fourth metatarsal head consistent with stress reaction areas on previous MRI scan from October 07, 2017 for comparison. Impression: Healed second metatarsal neck and head fracture.  Remodeling third and fourth head and neck without displacement or angulation.    PMFS History: Patient Active Problem List   Diagnosis Date Noted  . Allergic rhinitis 10/19/2017  . Closed displaced fracture of second metatarsal bone of right foot 10/07/2017  . Metatarsal stress fracture of right foot 10/07/2017  . Amputated toe of right foot (HCC) 09/01/2017  . CKD stage 3 due to type 2 diabetes mellitus (HCC)   . Diabetic ulcer of right great  toe (HCC) 07/22/2017  . Osteomyelitis of toe of right foot (HCC) 07/22/2017  . Foot ulcer, right (HCC) 06/19/2017  . Leukocytosis 09/26/2016  . Chronic ulcer of left foot (HCC) 09/26/2016  . Abnormal nuclear stress test 09/04/2016  . Exertional angina (HCC)   . Acute blood loss anemia 08/21/2016  . BRBPR (bright red blood per rectum) 08/21/2016  . HTN (hypertension) 08/21/2016  . DM2 (diabetes mellitus, type 2) (HCC) 08/21/2016  . Epistaxis 06/12/2016  . Neck pain on left side 06/12/2016  . Chronic venous insufficiency 10/01/2015  .  Respiratory distress 09/19/2013  . GERD (gastroesophageal reflux disease) 09/05/2013  . Pain in limb 11/30/2012  . Varicose veins of lower extremities with other complications 11/30/2012  . COPD (chronic obstructive pulmonary disease) (HCC) 12/12/2010  . OSA (obstructive sleep apnea) 12/12/2010  . Rheumatoid arthritis(714.0) 12/12/2010  . Intercostal pain 10/04/2010  . High risk medications (not anticoagulants) long-term use 10/04/2010  . Dyspnea 10/04/2010  . PAF (paroxysmal atrial fibrillation) (HCC)   . Chronic anticoagulation   . Chronic chest pain   . Obesity    Past Medical History:  Diagnosis Date  . Chronic anticoagulation   . Chronic back pain   . Chronic chest pain   . Chronic venous insufficiency   . Diabetes mellitus without complication (HCC)   . Emphysema   . HTN (hypertension)   . Obesity   . PAF (paroxysmal atrial fibrillation) (HCC)   . Pneumothorax   . Rheumatoid arthritis(714.0)     Family History  Problem Relation Age of Onset  . Other Father        CEREBRAL THROMBOSIS  . Rheum arthritis Mother   . Bone cancer Mother   . Heart failure Brother 50  . Coronary artery disease Brother 7       PTCA  . ALS Sister   . Rheum arthritis Brother   . Rheum arthritis Brother     Past Surgical History:  Procedure Laterality Date  . ablation    . AMPUTATION Right 07/23/2017   Procedure: right great toe amputation;  Surgeon: Eldred Manges, MD;  Location: [facility];  Service: Orthopedics;  Laterality: Right;  . CARDIAC CATHETERIZATION  1998   NORMAL  . COLONOSCOPY WITH PROPOFOL N/A 08/25/2016   Procedure: COLONOSCOPY WITH PROPOFOL;  Surgeon: Carman Ching, MD;  Location: [facility];  Service: Endoscopy;  Laterality: N/A;  . HERNIA REPAIR    . LEFT HEART CATH AND CORONARY ANGIOGRAPHY N/A 10/17/2016   Procedure: Left Heart Cath and Coronary Angiography;  Surgeon: Peter M Swaziland, MD;  Location: [facility] INVASIVE CV LAB;  Service: Cardiovascular;  Laterality: N/A;  . PLEURAL  SCARIFICATION    . VEIN LIGATION AND STRIPPING     Social History   Occupational History  . Occupation: Occupational psychologist: [employer]  Tobacco Use  . Smoking status: Former Smoker    Packs/day: 2.00    Years: 30.00    Pack years: 60.00    Types: Cigarettes    Last attempt to quit: 03/14/1996    Years since quitting: 21.6  . Smokeless tobacco: Never Used  Substance and Sexual Activity  . Alcohol use: Yes    Comment: one beer per month  . Drug use: No  . Sexual activity: Never

## 2017-11-29 ENCOUNTER — Encounter: Payer: Self-pay | Admitting: Emergency Medicine

## 2017-12-30 ENCOUNTER — Ambulatory Visit: Payer: Medicare Other | Admitting: Adult Health

## 2017-12-30 ENCOUNTER — Encounter: Payer: Self-pay | Admitting: Adult Health

## 2017-12-30 DIAGNOSIS — G4733 Obstructive sleep apnea (adult) (pediatric): Secondary | ICD-10-CM

## 2017-12-30 DIAGNOSIS — J449 Chronic obstructive pulmonary disease, unspecified: Secondary | ICD-10-CM

## 2017-12-30 NOTE — Patient Instructions (Signed)
Continue on BiPAP at bedtime Keep up the good work Do not drive if  sleepy Work on healthy weight Continue on Anoro daily, rinse after use Follow-up with Dr. Delton Coombes in 3 to 4 months and as needed

## 2017-12-30 NOTE — Progress Notes (Signed)
@Patient  ID: , male    DOB: 1946-07-07, 72 y.o.   MRN: [id number]  Chief Complaint  Patient presents with  . Follow-up    OSA     Referring provider: No ref. provider found  HPI: 72 year old male followed for COPD, obstructive sleep apnea on BiPAP Past medical history significant for rheumatoid arthritis, A. fib on anticoagulation  12/30/2017 Follow up : OSA  Patient presents for a follow-up visit for sleep apnea.  Patient is recently gotten a new BiPAP machine.  He has had broken.  Patient says he is doing much better on his new BiPAP.  He wears it every night.  He feels more rested.  With decreased daytime sleepiness. Download shows excellent compliance with average usage at 6 hours.  Patient is on IPAP 18, EPAP 14, pressure support 4 cm H2O.  AHI is 10.7.  Minimal leaks.  Patient does have underlying COPD.   remains on Anoro daily.  Denies any flare of cough or wheezing..  Working on weight loss. Down 37 lbs over last 6 months .      No Known Allergies  Immunization History  Administered Date(s) Administered  . Influenza Split 07/09/2012  . Influenza Whole 10/21/2011  . Influenza, High Dose Seasonal PF 06/12/2015, 06/12/2016, 04/08/2017  . Influenza,inj,Quad PF,6+ Mos 07/09/2012, 04/13/2013, 04/07/2014, 06/12/2015, 06/12/2016    Past Medical History:  Diagnosis Date  . Chronic anticoagulation   . Chronic back pain   . Chronic chest pain   . Chronic venous insufficiency   . Diabetes mellitus without complication (HCC)   . Emphysema   . HTN (hypertension)   . Obesity   . PAF (paroxysmal atrial fibrillation) (HCC)   . Pneumothorax   . Rheumatoid arthritis(714.0)     Tobacco History: Social History   Tobacco Use  Smoking Status Former Smoker  . Packs/day: 2.00  . Years: 30.00  . Pack years: 60.00  . Types: Cigarettes  . Last attempt to quit: 03/14/1996  . Years since quitting: 21.8  Smokeless Tobacco Never Used   Counseling given: Not  Answered   Outpatient Encounter Medications as of 12/30/2017  Medication Sig  . amiodarone (PACERONE) 200 MG tablet TAKE 1 TABLET BY MOUTH  DAILY  . ANORO ELLIPTA 62.5-25 MCG/INH AEPB INHALE 1 PUFF INTO THE LUNGS DAILY.  08-12-1985 atenolol (TENORMIN) 50 MG tablet TAKE ONE TABLET BY MOUTH TWICE A DAY  . diltiazem (CARDIZEM CD) 180 MG 24 hr capsule TAKE 1 CAPSULE BY MOUTH  DAILY  . furosemide (LASIX) 40 MG tablet TAKE 1 TABLET BY MOUTH  DAILY  . hydrochlorothiazide (HYDRODIURIL) 25 MG tablet TAKE 1 TABLET BY MOUTH  DAILY  . HYDROcodone-acetaminophen (NORCO) 5-325 MG tablet Take 1 tablet by mouth 2 (two) times daily as needed for moderate pain.  . metFORMIN (GLUCOPHAGE-XR) 500 MG 24 hr tablet Take 1 tablet by mouth 2 (two) times daily.  Marland Kitchen omeprazole (PRILOSEC) 20 MG capsule TAKE 1 CAPSULE BY MOUTH  DAILY  . oxyCODONE (ROXICODONE) 5 MG immediate release tablet Take 1 tablet (5 mg total) by mouth every 6 (six) hours as needed for severe pain.  . potassium chloride SA (K-DUR,KLOR-CON) 20 MEQ tablet TAKE ONE TABLET BY MOUTH TWO TIMES A DAY  . PROAIR HFA 108 (90 Base) MCG/ACT inhaler INHALE TWO PUFFS BY MOUTH EVERY 4 HOURS AS NEEDED FOR WHEEZING AND  SHORTNESS OF BREATH  . spironolactone (ALDACTONE) 25 MG tablet TAKE 1 TABLET BY MOUTH  DAILY  . XARELTO 20 MG  TABS tablet TAKE ONE TABLET BY MOUTH DAILY WITH SUPPER  . ciprofloxacin (CIPRO) 500 MG tablet    No facility-administered encounter medications on file as of 12/30/2017.      Review of Systems  Constitutional:   No  weight loss, night sweats,  Fevers, chills, + fatigue, or  lassitude.  HEENT:   No headaches,  Difficulty swallowing,  Tooth/dental problems, or  Sore throat,                No sneezing, itching, ear ache, nasal congestion, post nasal drip,   CV:  No chest pain,  Orthopnea, PND, swelling in lower extremities, anasarca, dizziness, palpitations, syncope.   GI  No heartburn, indigestion, abdominal pain, nausea, vomiting, diarrhea,  change in bowel habits, loss of appetite, bloody stools.   Resp: No shortness of breath with exertion or at rest.  No excess mucus, no productive cough,  No non-productive cough,  No coughing up of blood.  No change in color of mucus.  No wheezing.  No chest wall deformity  Skin: no rash or lesions.  GU: no dysuria, change in color of urine, no urgency or frequency.  No flank pain, no hematuria   MS:  No joint pain or swelling.  No decreased range of motion.  No back pain.    Physical Exam  BP 118/70 (BP Location: Left Arm, Cuff Size: Normal)   Pulse 67   Ht 6\' 6"  (1.981 m)   Wt (!) 329 lb (149.2 kg)   SpO2 95%   BMI 38.02 kg/m   GEN: A/Ox3; pleasant , NAD, obese    HEENT:  [place]/AT,  EACs-clear, TMs-wnl, NOSE-clear, THROAT-clear, no lesions, no postnasal drip or exudate noted. Class 2 MP airway   NECK:  Supple w/ fair ROM; no JVD; normal carotid impulses w/o bruits; no thyromegaly or nodules palpated; no lymphadenopathy.    RESP  Clear  P & A; w/o, wheezes/ rales/ or rhonchi. no accessory muscle use, no dullness to percussion  CARD:  RRR, no m/r/g, no peripheral edema, pulses intact, no cyanosis or clubbing.  GI:   Soft & nt; nml bowel sounds; no organomegaly or masses detected.   Musco: Warm bil, no deformities or joint swelling noted.   Neuro: alert, no focal deficits noted.    Skin: Warm, no lesions or rashes    Lab Results:  CBC  BMET  BNP  ProBNP Imaging: No results found.   Assessment & Plan:   OSA (obstructive sleep apnea) Doing well on new BIPAP machine   Plan  Patient Instructions  Continue on BiPAP at bedtime Keep up the good work Do not drive if  sleepy Work on healthy weight Continue on Anoro daily, rinse after use Follow-up with Dr. in 3 to 4 months and as needed      COPD (chronic obstructive pulmonary disease) (HCC) Cont on [facility] daily      Tammy Parrett, NP 12/30/2017

## 2017-12-30 NOTE — Assessment & Plan Note (Signed)
Doing well on new BIPAP machine   Plan  Patient Instructions  Continue on BiPAP at bedtime Keep up the good work Do not drive if  sleepy Work on healthy weight Continue on Anoro daily, rinse after use Follow-up with Dr. Delton Coombes in 3 to 4 months and as needed

## 2017-12-30 NOTE — Assessment & Plan Note (Signed)
Cont on [place] daily

## 2018-01-05 ENCOUNTER — Encounter (INDEPENDENT_AMBULATORY_CARE_PROVIDER_SITE_OTHER): Payer: Self-pay | Admitting: Orthopaedic Surgery

## 2018-01-05 ENCOUNTER — Ambulatory Visit (INDEPENDENT_AMBULATORY_CARE_PROVIDER_SITE_OTHER): Payer: Medicare Other | Admitting: Orthopaedic Surgery

## 2018-01-05 ENCOUNTER — Ambulatory Visit (INDEPENDENT_AMBULATORY_CARE_PROVIDER_SITE_OTHER): Payer: Self-pay

## 2018-01-05 VITALS — BP 125/66 | HR 66 | Ht 78.0 in | Wt 329.0 lb

## 2018-01-05 DIAGNOSIS — S92321A Displaced fracture of second metatarsal bone, right foot, initial encounter for closed fracture: Secondary | ICD-10-CM

## 2018-01-05 NOTE — Progress Notes (Signed)
Office Visit Note   Patient: [name]           Date of Birth: [birth year]           MRN: [id number] Visit Date: 01/05/2018              Requested by: No referring provider defined for this encounter. PCP: System, Pcp Not In   Assessment & Plan: Visit Diagnoses:  1. Closed displaced fracture of second metatarsal bone of right foot, initial encounter     Plan: Patient can resume going to the gym using the pool starting with the bicycle and then gradually increasing his activities.  He has issues with Plastizote inserts.  We discussed the importance of diabetic control to lessen his chance of  further ulcerations further surgery on his feet.  He has some weight he could lose to help unload his feet and improve his diabetes with his BMI of 38.  We will check him back again on a as needed basis.  Follow-Up Instructions: Return if symptoms worsen or fail to improve.   Orders:  Orders Placed This Encounter  Procedures  . XR Foot 2 Views Right   No orders of the defined types were placed in this encounter.     Procedures: No procedures performed   Clinical Data: No additional findings.   Subjective: Chief Complaint  Patient presents with  . Right Foot - Fracture, Follow-up    HPI follow-up first ray amputation 07/23/2017 with early increased ambulation and second metatarsal neck fracture.  He did not have any significant pain due to his diabetic neuropathy.  Initially the MRI of the foot 10/07/2017 showed nondisplaced fracture and then with increased ambulation the fracture is shortened and angulated.  He returns today for follow-up.  No skin problems no fever or chills he is not on antibiotics.  Review of Systems 14 point review of systems updated unchanged.   Objective: Vital Signs: BP 125/66   Pulse 66   Ht 6\' 6"  (1.981 m)   Wt (!) 329 lb (149.2 kg)   BMI 38.02 kg/m   Physical Exam  Constitutional: He is oriented to person, place, and time. He appears  well-developed and well-nourished.  HENT:  Head: Normocephalic and atraumatic.  Eyes: Pupils are equal, round, and reactive to light. EOM are normal.  Neck: No tracheal deviation present. No thyromegaly present.  Cardiovascular: Normal rate.  Pulmonary/Chest: Effort normal. He has no wheezes.  Abdominal: Soft. Bowel sounds are normal.  Neurological: He is alert and oriented to person, place, and time.  Skin: Skin is warm and dry. Capillary refill takes less than 2 seconds.  Psychiatric: He has a normal mood and affect. His behavior is normal. Judgment and thought content normal.    Ortho Exam increased BMI of 38.  Foot is well-healed.  There is some skin discoloration on the dorsum darkening consistent with the venous stasis.  No plantar foot lesions.  First ray amputation incisions completely healed.  Callus is noted over the second distal metatarsal bone but no motion at the fracture site.  Specialty Comments:  No specialty comments available.  Imaging: Xr Foot 2 Views Right  Result Date: 01/05/2018 2 view x-rays right foot obtained and reviewed.  This shows abundant callus formation of the second metatarsal neck fracture.  No new fractures are noted. Impression: Healing of second metatarsal neck fracture with shortening and mild angulation.    PMFS History: Patient Active Problem List   Diagnosis Date Noted  .  Allergic rhinitis 10/19/2017  . Closed displaced fracture of second metatarsal bone of right foot 10/07/2017  . Metatarsal stress fracture of right foot 10/07/2017  . Amputated toe of right foot (HCC) 09/01/2017  . CKD stage 3 due to type 2 diabetes mellitus (HCC)   . Diabetic ulcer of right great toe (HCC) 07/22/2017  . Osteomyelitis of toe of right foot (HCC) 07/22/2017  . Foot ulcer, right (HCC) 06/19/2017  . Leukocytosis 09/26/2016  . Chronic ulcer of left foot (HCC) 09/26/2016  . Abnormal nuclear stress test 09/04/2016  . Exertional angina (HCC)   . Acute blood  loss anemia 08/21/2016  . BRBPR (bright red blood per rectum) 08/21/2016  . HTN (hypertension) 08/21/2016  . DM2 (diabetes mellitus, type 2) (HCC) 08/21/2016  . Epistaxis 06/12/2016  . Neck pain on left side 06/12/2016  . Chronic venous insufficiency 10/01/2015  . Respiratory distress 09/19/2013  . GERD (gastroesophageal reflux disease) 09/05/2013  . Pain in limb 11/30/2012  . Varicose veins of lower extremities with other complications 11/30/2012  . COPD (chronic obstructive pulmonary disease) (HCC) 12/12/2010  . OSA (obstructive sleep apnea) 12/12/2010  . Rheumatoid arthritis(714.0) 12/12/2010  . Intercostal pain 10/04/2010  . High risk medications (not anticoagulants) long-term use 10/04/2010  . Dyspnea 10/04/2010  . PAF (paroxysmal atrial fibrillation) (HCC)   . Chronic anticoagulation   . Chronic chest pain   . Obesity    Past Medical History:  Diagnosis Date  . Chronic anticoagulation   . Chronic back pain   . Chronic chest pain   . Chronic venous insufficiency   . Diabetes mellitus without complication (HCC)   . Emphysema   . HTN (hypertension)   . Obesity   . PAF (paroxysmal atrial fibrillation) (HCC)   . Pneumothorax   . Rheumatoid arthritis(714.0)     Family History  Problem Relation Age of Onset  . Other Father        CEREBRAL THROMBOSIS  . Rheum arthritis Mother   . Bone cancer Mother   . Heart failure Brother 50  . Coronary artery disease Brother 75       PTCA  . ALS Sister   . Rheum arthritis Brother   . Rheum arthritis Brother     Past Surgical History:  Procedure Laterality Date  . ablation    . AMPUTATION Right 07/23/2017   Procedure: right great toe amputation;  Surgeon: Eldred Manges, MD;  Location: [facility];  Service: Orthopedics;  Laterality: Right;  . CARDIAC CATHETERIZATION  1998   NORMAL  . COLONOSCOPY WITH PROPOFOL N/A 08/25/2016   Procedure: COLONOSCOPY WITH PROPOFOL;  Surgeon: Carman Ching, MD;  Location: [facility];  Service:  Endoscopy;  Laterality: N/A;  . HERNIA REPAIR    . LEFT HEART CATH AND CORONARY ANGIOGRAPHY N/A 10/17/2016   Procedure: Left Heart Cath and Coronary Angiography;  Surgeon: Peter M Swaziland, MD;  Location: [facility] INVASIVE CV LAB;  Service: Cardiovascular;  Laterality: N/A;  . PLEURAL SCARIFICATION    . VEIN LIGATION AND STRIPPING     Social History   Occupational History  . Occupation: Occupational psychologist: [employer]  Tobacco Use  . Smoking status: Former Smoker    Packs/day: 2.00    Years: 30.00    Pack years: 60.00    Types: Cigarettes    Last attempt to quit: 03/14/1996    Years since quitting: 21.8  . Smokeless tobacco: Never Used  Substance and Sexual Activity  . Alcohol use: Yes  Comment: one beer per month  . Drug use: No  . Sexual activity: Never

## 2018-01-09 ENCOUNTER — Other Ambulatory Visit: Payer: Self-pay | Admitting: Cardiology

## 2018-02-26 ENCOUNTER — Other Ambulatory Visit: Payer: Self-pay | Admitting: Cardiology

## 2018-02-26 ENCOUNTER — Other Ambulatory Visit: Payer: Self-pay | Admitting: Emergency Medicine

## 2018-03-03 ENCOUNTER — Other Ambulatory Visit: Payer: Self-pay | Admitting: Cardiology

## 2018-03-31 ENCOUNTER — Other Ambulatory Visit: Payer: Self-pay | Admitting: Family Medicine

## 2018-03-31 DIAGNOSIS — Z87891 Personal history of nicotine dependence: Secondary | ICD-10-CM

## 2018-04-01 ENCOUNTER — Ambulatory Visit: Payer: Medicare Other | Admitting: Emergency Medicine

## 2018-04-01 ENCOUNTER — Encounter: Payer: Self-pay | Admitting: Emergency Medicine

## 2018-04-01 DIAGNOSIS — J449 Chronic obstructive pulmonary disease, unspecified: Secondary | ICD-10-CM | POA: Diagnosis not present

## 2018-04-01 DIAGNOSIS — Z23 Encounter for immunization: Secondary | ICD-10-CM

## 2018-04-01 DIAGNOSIS — G4733 Obstructive sleep apnea (adult) (pediatric): Secondary | ICD-10-CM | POA: Diagnosis not present

## 2018-04-01 NOTE — Assessment & Plan Note (Signed)
  Continue Anoro once daily. Keep your albuterol available to use 2 puffs if needed for shortness of breath, chest tightness, wheezing. Flu shot today. Follow with Dr Delton Coombes in 6 months or sooner if you have any problems

## 2018-04-01 NOTE — Progress Notes (Signed)
  Subjective:    Patient ID: [name], male    DOB: Nov 13, 1945, 72 y.o.   MRN: [id number]   HPI  ROV 04/16/17 -- Patient has a history of COPD and obstructive sleep apnea on CPAP. Also with rheumatoid arthritis,Diabetes, hypertension, atrial fibrillation on anticoagulation.  He has been rx for Fe deficiency anemia, likely hemorrhoids. He was treated with abx, unsure why or which one. He is on Anoro, tolerates. No flares since last time. He is using albuterol frequently every day. Unsure whether it helps him. Flu shot up to date. Has a lot of dry cough. Occasionally productive. He is on omeprazole. He remains on BiPAP, tolerates it well but has mouth leak  CAT score today 22.   ROV 10/19/17 --50 year old gentleman with a history of obstructive sleep apnea formerly on CPAP, COPD, rheumatoid arthritis, hypertension, atrial fibrillation on anticoagulation, diabetes and iron deficiency anemia.  And ambulatory oximetry at his last visit in October 2018 did not show desaturation although he did had to stop due to fatigue and dyspnea. He had a recent URI / viral syndrome. He is improving now. Uses zyrtec prn. He had a R toe sgy for a chronic infxn. He uses Anoro reliably, uses albuterol 1x a day. He stopped using BiPAP about 4 months  because it was burning his mucous membranes, nose. The humidity on his device broke, no humidity.   ROV 04/01/18 --72 year old obese gentleman with a history of obstructive sleep apnea, COPD, rheumatoid arthritis, hypertension, atrial fibrillation on anticoagulation, diabetes, iron deficiency anemia.  His COPD has been managed on Anoro.  Since my last visit with him he had to obtain a new BiPAP machine, had not been able to use humidity.  He is currently using 18/14, pressure support 4. Uses a mas that is combo nasal pillows + mouth covered.  He has a lot of sneezing, coughs. Last week his chest was bothering him, pressure that happened when sitting still. Didn';t try albuterol.     Objective:   Physical Exam Vitals:   04/01/18 0855  BP: 122/76  Pulse: 69  SpO2: 95%  Weight: (!) 334 lb (151.5 kg)  Height: 6\' 6"  (1.981 m)   Gen: Pleasant, obese, in no distress,  normal affect  ENT: No lesions,  mouth clear,  oropharynx clear, no postnasal drip  Neck: No JVD, no stridor  Lungs: No use of accessory muscles, no wheeze, or crackles.   Cardiovascular: RRR, systolic M  Musculoskeletal: No deformities, no cyanosis or clubbing  Neuro: alert, non focal  Skin: Warm, no lesions or rashes   Assessment & Plan:  COPD (chronic obstructive pulmonary disease) (HCC)  Continue Anoro once daily. Keep your albuterol available to use 2 puffs if needed for shortness of breath, chest tightness, wheezing. Flu shot today. Follow with Dr in 6 months or sooner if you have any problems  OSA (obstructive sleep apnea) He is doing much better since his BiPAP was changed and now has humidity.  He has a mask that includes nasal pillows and also covers his mouth.  Settings are 18/14.  Great compliance based on his download information available today, 29 of 30 days over 4 hours usage.  He has a clinical benefit, much less sleepy much more energy.  Ricky Coombes, MD, PhD 04/01/2018, 9:17 AM [facility] [phone] or if no answer [phone]

## 2018-04-01 NOTE — Assessment & Plan Note (Signed)
He is doing much better since his BiPAP was changed and now has humidity.  He has a mask that includes nasal pillows and also covers his mouth.  Settings are 18/14.  Great compliance based on his download information available today, 29 of 30 days over 4 hours usage.  He has a clinical benefit, much less sleepy much more energy.

## 2018-04-01 NOTE — Patient Instructions (Signed)
Please continue to use her BiPAP every night as you have been doing it. Continue Anoro once daily. Keep your albuterol available to use 2 puffs if needed for shortness of breath, chest tightness, wheezing. Flu shot today. If you continue to have sneezing and cough we could try restarting allergy medication including antihistamine or nasal spray.  Let us know if you feel like this would be helpful. Follow with Dr Delton Coombes in 6 months or sooner if you have any problems

## 2018-04-09 ENCOUNTER — Ambulatory Visit
Admission: RE | Admit: 2018-04-09 | Discharge: 2018-04-09 | Disposition: A | Discharge status: Home / Self Care | Payer: Medicare Other | Source: Ambulatory Visit | Attending: Family Medicine | Admitting: Family Medicine

## 2018-04-09 DIAGNOSIS — Z87891 Personal history of nicotine dependence: Secondary | ICD-10-CM

## 2018-04-13 DIAGNOSIS — K760 Fatty (change of) liver, not elsewhere classified: Secondary | ICD-10-CM | POA: Insufficient documentation

## 2018-05-03 ENCOUNTER — Ambulatory Visit: Payer: Medicare Other | Admitting: Podiatry

## 2018-05-03 DIAGNOSIS — Z89619 Acquired absence of unspecified leg above knee: Secondary | ICD-10-CM | POA: Diagnosis not present

## 2018-05-03 DIAGNOSIS — E11621 Type 2 diabetes mellitus with foot ulcer: Secondary | ICD-10-CM | POA: Insufficient documentation

## 2018-05-03 DIAGNOSIS — B351 Tinea unguium: Secondary | ICD-10-CM

## 2018-05-03 DIAGNOSIS — L97511 Non-pressure chronic ulcer of other part of right foot limited to breakdown of skin: Secondary | ICD-10-CM | POA: Diagnosis not present

## 2018-05-03 DIAGNOSIS — L97521 Non-pressure chronic ulcer of other part of left foot limited to breakdown of skin: Secondary | ICD-10-CM

## 2018-05-03 DIAGNOSIS — L97529 Non-pressure chronic ulcer of other part of left foot with unspecified severity: Secondary | ICD-10-CM

## 2018-05-03 DIAGNOSIS — E1169 Type 2 diabetes mellitus with other specified complication: Secondary | ICD-10-CM | POA: Diagnosis not present

## 2018-05-03 NOTE — Progress Notes (Signed)
Subjective:  Patient ID: [name], male    DOB: 1946/04/29,  MRN: [id number]  Chief Complaint  Patient presents with  . Toe Pain    left great toe is painful    72 y.o. male presents  for diabetic foot care. Last AMBS was unkown, but "high." Reports injury to the left great toe for a cuple months. Denies pain.  States that he had a blister that popped. Reports numbness and tingling in their feet. Denies cramping in legs and thighs.  Review of Systems: Negative except as noted in the HPI. Denies N/V/F/Ch.  Past Medical History:  Diagnosis Date  . Chronic anticoagulation   . Chronic back pain   . Chronic chest pain   . Chronic venous insufficiency   . Diabetes mellitus without complication (HCC)   . Emphysema   . HTN (hypertension)   . Obesity   . PAF (paroxysmal atrial fibrillation) (HCC)   . Pneumothorax   . Rheumatoid arthritis(714.0)     Current Outpatient Medications:  .  amiodarone (PACERONE) 200 MG tablet, TAKE 1 TABLET BY MOUTH  DAILY, Disp: 90 tablet, Rfl: 1 .  ANORO ELLIPTA 62.5-25 MCG/INH AEPB, INHALE 1 PUFF INTO THE LUNGS DAILY., Disp: 60 each, Rfl: 5 .  atenolol (TENORMIN) 50 MG tablet, TAKE ONE TABLET BY MOUTH TWICE A DAY, Disp: 180 tablet, Rfl: 2 .  diltiazem (CARDIZEM CD) 180 MG 24 hr capsule, TAKE 1 CAPSULE BY MOUTH  DAILY, Disp: 90 capsule, Rfl: 1 .  hydrochlorothiazide (HYDRODIURIL) 25 MG tablet, TAKE 1 TABLET BY MOUTH  DAILY, Disp: 90 tablet, Rfl: 3 .  HYDROcodone-acetaminophen (NORCO) 5-325 MG tablet, Take 1 tablet by mouth 2 (two) times daily as needed for moderate pain., Disp: 20 tablet, Rfl: 0 .  metFORMIN (GLUCOPHAGE-XR) 500 MG 24 hr tablet, Take 1 tablet by mouth 2 (two) times daily., Disp: , Rfl:  .  omeprazole (PRILOSEC) 20 MG capsule, TAKE 1 CAPSULE BY MOUTH  DAILY, Disp: 90 capsule, Rfl: 1 .  oxyCODONE (ROXICODONE) 5 MG immediate release tablet, Take 1 tablet (5 mg total) by mouth every 6 (six) hours as needed for severe pain., Disp: 20 tablet,  Rfl: 0 .  potassium chloride SA (K-DUR,KLOR-CON) 20 MEQ tablet, TAKE ONE TABLET BY MOUTH TWO TIMES A DAY, Disp: 180 tablet, Rfl: 3 .  PROAIR HFA 108 (90 Base) MCG/ACT inhaler, INHALE TWO PUFFS BY MOUTH EVERY 4 HOURS AS NEEDED FOR WHEEZING AND  SHORTNESS OF BREATH, Disp: 8.5 each, Rfl: 2 .  spironolactone (ALDACTONE) 25 MG tablet, TAKE 1 TABLET BY MOUTH  DAILY, Disp: 90 tablet, Rfl: 3 .  XARELTO 20 MG TABS tablet, TAKE ONE TABLET BY MOUTH DAILY WITH SUPPER, Disp: 30 tablet, Rfl: 3  Social History   Tobacco Use  Smoking Status Former Smoker  . Packs/day: 2.00  . Years: 30.00  . Pack years: 60.00  . Types: Cigarettes  . Last attempt to quit: 03/14/1996  . Years since quitting: 22.1  Smokeless Tobacco Never Used    No Known Allergies Objective:  There were no vitals filed for this visit. There is no height or weight on file to calculate BMI. Constitutional Well developed. Well nourished.  Vascular Dorsalis pedis pulses present 1+ bilaterally  Posterior tibial pulses absent bilaterally  Pedal hair growth absent. Capillary refill normal to all digits.  No cyanosis or clubbing noted.  Neurologic Normal speech. Oriented to person, place, and time. Epicritic sensation to light touch grossly present bilaterally. Protective sensation with 5.07 monofilament  absent bilaterally.  Dermatologic Nails elongated, thickened, dystrophic. Superficial ulcer right 2nd toe PIPJ. No probe to bone. Ulcer L hallux with onycholysis. No warmth or erythema. No signs of acute infection.  Orthopedic: Normal joint ROM without pain or crepitus bilaterally. No bony tenderness. Amputation R hallux noted. Hammertoes bilat   Assessment:   1. History of diabetes-related lower limb amputation (HCC)   2. Onychomycosis   3. Skin ulcer of second toe of right foot, limited to breakdown of skin (HCC)   4. Ulcer of great toe, left, limited to breakdown of skin [facility])    Plan:  Patient was evaluated and treated and  all questions answered.  Diabetes with Amputation Hx, Onychomycosis -Educated on diabetic footcare. Diabetic risk level 3 -Nails x9 debrided sharply and manually with large nail nipper and rotary burr.   Procedure: Nail Debridement Rationale: Patient meets criteria for routine foot care due to Amputation hx Type of Debridement: manual, sharp debridement. Instrumentation: Nail nipper, rotary burr. Number of Nails: 9   Wound L Hallux -Excisional debridement of wound  Procedure: Excisional Debridement of Wound Rationale: Removal of non-viable soft tissue from the wound to promote healing.  Anesthesia: none Pre-Debridement Wound Measurements: overlying hyperkeratosis Post-Debridement Wound Measurements: 0.2 cm x 0.2 cm x 0.1 cm  Type of Debridement: Sharp Excisional Tissue Removed: Non-viable soft tissue Depth of Debridement: subcutaneous tissue. Technique: Sharp excisional debridement to bleeding, viable wound base.  Dressing: Dry, sterile, compression dressing. Disposition: Patient tolerated procedure well. Patient to return in 2 weeks for follow-up.  Wound R 2nd toe -No debridement performed today. -Dressed with povidone ointment and band-aid. -Advised to dress daily.  Return in about 2 weeks (around 05/17/2018) for Wound Care, Bilateral.

## 2018-05-11 ENCOUNTER — Ambulatory Visit: Payer: Medicare Other | Admitting: Orthotics

## 2018-05-11 DIAGNOSIS — L97521 Non-pressure chronic ulcer of other part of left foot limited to breakdown of skin: Secondary | ICD-10-CM

## 2018-05-11 DIAGNOSIS — E1142 Type 2 diabetes mellitus with diabetic polyneuropathy: Secondary | ICD-10-CM

## 2018-05-11 DIAGNOSIS — E1169 Type 2 diabetes mellitus with other specified complication: Secondary | ICD-10-CM

## 2018-05-11 DIAGNOSIS — S98131A Complete traumatic amputation of one right lesser toe, initial encounter: Secondary | ICD-10-CM

## 2018-05-11 DIAGNOSIS — L97519 Non-pressure chronic ulcer of other part of right foot with unspecified severity: Secondary | ICD-10-CM

## 2018-05-11 DIAGNOSIS — Z89619 Acquired absence of unspecified leg above knee: Principal | ICD-10-CM

## 2018-05-11 DIAGNOSIS — E11621 Type 2 diabetes mellitus with foot ulcer: Secondary | ICD-10-CM

## 2018-05-11 NOTE — Progress Notes (Signed)
Patient was measured for med necessity extra depth shoes (x2) w/ 3 custom f/o l and one L5000 TF R.   Patient was measured w/ brannock to determine size and width.  Foam impression mold was achieved and deemed appropriate for fabrication of  cmfo.   See DPM chart notes for further documentation and dx codes for determination of medical necessity.  Appropriate forms will be sent to PCP to verify and sign off on medical necessity.

## 2018-05-20 ENCOUNTER — Ambulatory Visit: Payer: Medicare Other | Admitting: Podiatry

## 2018-05-20 DIAGNOSIS — L97521 Non-pressure chronic ulcer of other part of left foot limited to breakdown of skin: Secondary | ICD-10-CM

## 2018-05-20 DIAGNOSIS — L928 Other granulomatous disorders of the skin and subcutaneous tissue: Secondary | ICD-10-CM

## 2018-05-20 NOTE — Progress Notes (Signed)
Subjective:  Patient ID: [name], male    DOB: 1946/03/06,  MRN: [id number]  Chief Complaint  Patient presents with  . Diabetic Ulcer    bilateral - 3 week follow up    72 y.o. male presents  for wound check. Thinks the wound is improving.  Denies new issues. Review of Systems: Negative except as noted in the HPI. Denies N/V/F/Ch.  Past Medical History:  Diagnosis Date  . Chronic anticoagulation   . Chronic back pain   . Chronic chest pain   . Chronic venous insufficiency   . Diabetes mellitus without complication (HCC)   . Emphysema   . HTN (hypertension)   . Obesity   . PAF (paroxysmal atrial fibrillation) (HCC)   . Pneumothorax   . Rheumatoid arthritis(714.0)     Current Outpatient Medications:  .  amiodarone (PACERONE) 200 MG tablet, TAKE 1 TABLET BY MOUTH  DAILY, Disp: 90 tablet, Rfl: 1 .  ANORO ELLIPTA 62.5-25 MCG/INH AEPB, INHALE 1 PUFF INTO THE LUNGS DAILY., Disp: 60 each, Rfl: 5 .  atenolol (TENORMIN) 50 MG tablet, TAKE ONE TABLET BY MOUTH TWICE A DAY, Disp: 180 tablet, Rfl: 2 .  diltiazem (CARDIZEM CD) 180 MG 24 hr capsule, TAKE 1 CAPSULE BY MOUTH  DAILY, Disp: 90 capsule, Rfl: 1 .  hydrochlorothiazide (HYDRODIURIL) 25 MG tablet, TAKE 1 TABLET BY MOUTH  DAILY, Disp: 90 tablet, Rfl: 3 .  HYDROcodone-acetaminophen (NORCO) 5-325 MG tablet, Take 1 tablet by mouth 2 (two) times daily as needed for moderate pain., Disp: 20 tablet, Rfl: 0 .  metFORMIN (GLUCOPHAGE-XR) 500 MG 24 hr tablet, Take 1 tablet by mouth 2 (two) times daily., Disp: , Rfl:  .  omeprazole (PRILOSEC) 20 MG capsule, TAKE 1 CAPSULE BY MOUTH  DAILY, Disp: 90 capsule, Rfl: 1 .  oxyCODONE (ROXICODONE) 5 MG immediate release tablet, Take 1 tablet (5 mg total) by mouth every 6 (six) hours as needed for severe pain., Disp: 20 tablet, Rfl: 0 .  potassium chloride SA (K-DUR,KLOR-CON) 20 MEQ tablet, TAKE ONE TABLET BY MOUTH TWO TIMES A DAY, Disp: 180 tablet, Rfl: 3 .  PROAIR HFA 108 (90 Base) MCG/ACT inhaler,  INHALE TWO PUFFS BY MOUTH EVERY 4 HOURS AS NEEDED FOR WHEEZING AND  SHORTNESS OF BREATH, Disp: 8.5 each, Rfl: 2 .  spironolactone (ALDACTONE) 25 MG tablet, TAKE 1 TABLET BY MOUTH  DAILY, Disp: 90 tablet, Rfl: 3 .  XARELTO 20 MG TABS tablet, TAKE ONE TABLET BY MOUTH DAILY WITH SUPPER, Disp: 30 tablet, Rfl: 3  Social History   Tobacco Use  Smoking Status Former Smoker  . Packs/day: 2.00  . Years: 30.00  . Pack years: 60.00  . Types: Cigarettes  . Last attempt to quit: 03/14/1996  . Years since quitting: 22.1  Smokeless Tobacco Never Used    No Known Allergies Objective:  There were no vitals filed for this visit. There is no height or weight on file to calculate BMI. Constitutional Well developed. Well nourished.  Vascular Dorsalis pedis pulses present 1+ bilaterally  Posterior tibial pulses absent bilaterally  Pedal hair growth absent. Capillary refill normal to all digits.  No cyanosis or clubbing noted.  Neurologic Normal speech. Oriented to person, place, and time. Epicritic sensation to light touch grossly present bilaterally. Protective sensation with 5.07 monofilament  absent bilaterally.  Dermatologic Nails elongated, thickened, dystrophic. Superficial ulcer right 2nd toe PIPJ. No probe to bone. Ulcer L hallux with wholly granular base, small amount of hyperkeratosis  Orthopedic: Normal joint ROM without pain or crepitus bilaterally. No bony tenderness. Amputation R hallux noted. Hammertoes bilat   Assessment:   1. Ulcer of great toe, left, limited to breakdown of skin [facility])    Plan:  Patient was evaluated and treated and all questions answered.   Wound L Hallux -Wound base cauterized with silver nitrate to promote epithelization.  Procedure: Chemical Cauterization of Granulation Tissue Rationale: Cauterize granular wound base to promote healing.  Wound Measurements: 1 cm x 1 cm x 0.1 cm  Instrumentation: Silver nitrate stick x2 Dressing: Dry, sterile,  compression dressing. Disposition: Patient tolerated procedure well. Patient to return in 1 week for follow-up.  Return in about 3 weeks (around 06/10/2018) for Wound Care, Left.

## 2018-06-17 ENCOUNTER — Other Ambulatory Visit: Payer: Self-pay

## 2018-06-17 ENCOUNTER — Ambulatory Visit (INDEPENDENT_AMBULATORY_CARE_PROVIDER_SITE_OTHER): Payer: Medicare Other

## 2018-06-17 ENCOUNTER — Encounter (HOSPITAL_COMMUNITY): Payer: Self-pay | Admitting: General Practice

## 2018-06-17 ENCOUNTER — Inpatient Hospital Stay (HOSPITAL_COMMUNITY)
Admission: AD | Admit: 2018-06-17 | Discharge: 2018-06-27 | DRG: 616 | Disposition: A | Discharge status: Home / Self Care | Payer: Medicare Other | Attending: Internal Medicine | Admitting: Internal Medicine

## 2018-06-17 ENCOUNTER — Ambulatory Visit: Payer: Medicare Other | Admitting: Podiatry

## 2018-06-17 ENCOUNTER — Ambulatory Visit (INDEPENDENT_AMBULATORY_CARE_PROVIDER_SITE_OTHER): Payer: Medicare Other | Admitting: Orthotics

## 2018-06-17 DIAGNOSIS — E11621 Type 2 diabetes mellitus with foot ulcer: Principal | ICD-10-CM | POA: Diagnosis present

## 2018-06-17 DIAGNOSIS — M545 Low back pain: Secondary | ICD-10-CM | POA: Diagnosis present

## 2018-06-17 DIAGNOSIS — L97529 Non-pressure chronic ulcer of other part of left foot with unspecified severity: Secondary | ICD-10-CM | POA: Diagnosis not present

## 2018-06-17 DIAGNOSIS — D62 Acute posthemorrhagic anemia: Secondary | ICD-10-CM | POA: Diagnosis present

## 2018-06-17 DIAGNOSIS — Z6838 Body mass index (BMI) 38.0-38.9, adult: Secondary | ICD-10-CM

## 2018-06-17 DIAGNOSIS — L98494 Non-pressure chronic ulcer of skin of other sites with necrosis of bone: Secondary | ICD-10-CM | POA: Diagnosis present

## 2018-06-17 DIAGNOSIS — Z89412 Acquired absence of left great toe: Secondary | ICD-10-CM | POA: Diagnosis not present

## 2018-06-17 DIAGNOSIS — M2040 Other hammer toe(s) (acquired), unspecified foot: Secondary | ICD-10-CM | POA: Diagnosis present

## 2018-06-17 DIAGNOSIS — Z7901 Long term (current) use of anticoagulants: Secondary | ICD-10-CM | POA: Diagnosis not present

## 2018-06-17 DIAGNOSIS — L97524 Non-pressure chronic ulcer of other part of left foot with necrosis of bone: Secondary | ICD-10-CM

## 2018-06-17 DIAGNOSIS — M869 Osteomyelitis, unspecified: Secondary | ICD-10-CM | POA: Diagnosis present

## 2018-06-17 DIAGNOSIS — I872 Venous insufficiency (chronic) (peripheral): Secondary | ICD-10-CM | POA: Diagnosis present

## 2018-06-17 DIAGNOSIS — M86172 Other acute osteomyelitis, left ankle and foot: Secondary | ICD-10-CM

## 2018-06-17 DIAGNOSIS — E1169 Type 2 diabetes mellitus with other specified complication: Secondary | ICD-10-CM | POA: Diagnosis present

## 2018-06-17 DIAGNOSIS — B954 Other streptococcus as the cause of diseases classified elsewhere: Secondary | ICD-10-CM | POA: Diagnosis present

## 2018-06-17 DIAGNOSIS — J9601 Acute respiratory failure with hypoxia: Secondary | ICD-10-CM | POA: Diagnosis not present

## 2018-06-17 DIAGNOSIS — L97521 Non-pressure chronic ulcer of other part of left foot limited to breakdown of skin: Secondary | ICD-10-CM | POA: Diagnosis not present

## 2018-06-17 DIAGNOSIS — E1142 Type 2 diabetes mellitus with diabetic polyneuropathy: Secondary | ICD-10-CM

## 2018-06-17 DIAGNOSIS — I480 Paroxysmal atrial fibrillation: Secondary | ICD-10-CM | POA: Diagnosis present

## 2018-06-17 DIAGNOSIS — Z8261 Family history of arthritis: Secondary | ICD-10-CM

## 2018-06-17 DIAGNOSIS — J690 Pneumonitis due to inhalation of food and vomit: Secondary | ICD-10-CM | POA: Diagnosis not present

## 2018-06-17 DIAGNOSIS — S98131A Complete traumatic amputation of one right lesser toe, initial encounter: Secondary | ICD-10-CM | POA: Diagnosis not present

## 2018-06-17 DIAGNOSIS — J449 Chronic obstructive pulmonary disease, unspecified: Secondary | ICD-10-CM | POA: Diagnosis present

## 2018-06-17 DIAGNOSIS — N189 Chronic kidney disease, unspecified: Secondary | ICD-10-CM

## 2018-06-17 DIAGNOSIS — K5900 Constipation, unspecified: Secondary | ICD-10-CM | POA: Diagnosis not present

## 2018-06-17 DIAGNOSIS — L03119 Cellulitis of unspecified part of limb: Secondary | ICD-10-CM | POA: Diagnosis present

## 2018-06-17 DIAGNOSIS — E119 Type 2 diabetes mellitus without complications: Secondary | ICD-10-CM

## 2018-06-17 DIAGNOSIS — R0602 Shortness of breath: Secondary | ICD-10-CM

## 2018-06-17 DIAGNOSIS — Z8249 Family history of ischemic heart disease and other diseases of the circulatory system: Secondary | ICD-10-CM

## 2018-06-17 DIAGNOSIS — J439 Emphysema, unspecified: Secondary | ICD-10-CM | POA: Diagnosis present

## 2018-06-17 DIAGNOSIS — I129 Hypertensive chronic kidney disease with stage 1 through stage 4 chronic kidney disease, or unspecified chronic kidney disease: Secondary | ICD-10-CM | POA: Diagnosis present

## 2018-06-17 DIAGNOSIS — L97519 Non-pressure chronic ulcer of other part of right foot with unspecified severity: Secondary | ICD-10-CM | POA: Diagnosis not present

## 2018-06-17 DIAGNOSIS — E1122 Type 2 diabetes mellitus with diabetic chronic kidney disease: Secondary | ICD-10-CM | POA: Diagnosis not present

## 2018-06-17 DIAGNOSIS — E11628 Type 2 diabetes mellitus with other skin complications: Secondary | ICD-10-CM | POA: Diagnosis not present

## 2018-06-17 DIAGNOSIS — L03032 Cellulitis of left toe: Secondary | ICD-10-CM

## 2018-06-17 DIAGNOSIS — R0600 Dyspnea, unspecified: Secondary | ICD-10-CM | POA: Diagnosis present

## 2018-06-17 DIAGNOSIS — M069 Rheumatoid arthritis, unspecified: Secondary | ICD-10-CM | POA: Diagnosis present

## 2018-06-17 DIAGNOSIS — E669 Obesity, unspecified: Secondary | ICD-10-CM | POA: Diagnosis present

## 2018-06-17 DIAGNOSIS — N179 Acute kidney failure, unspecified: Secondary | ICD-10-CM | POA: Diagnosis not present

## 2018-06-17 DIAGNOSIS — N183 Chronic kidney disease, stage 3 unspecified: Secondary | ICD-10-CM | POA: Diagnosis present

## 2018-06-17 DIAGNOSIS — M8580 Other specified disorders of bone density and structure, unspecified site: Secondary | ICD-10-CM

## 2018-06-17 DIAGNOSIS — M7989 Other specified soft tissue disorders: Secondary | ICD-10-CM | POA: Diagnosis not present

## 2018-06-17 DIAGNOSIS — Z9889 Other specified postprocedural states: Secondary | ICD-10-CM

## 2018-06-17 DIAGNOSIS — E08621 Diabetes mellitus due to underlying condition with foot ulcer: Secondary | ICD-10-CM

## 2018-06-17 DIAGNOSIS — L97509 Non-pressure chronic ulcer of other part of unspecified foot with unspecified severity: Secondary | ICD-10-CM | POA: Diagnosis not present

## 2018-06-17 DIAGNOSIS — G8929 Other chronic pain: Secondary | ICD-10-CM | POA: Diagnosis present

## 2018-06-17 DIAGNOSIS — J181 Lobar pneumonia, unspecified organism: Secondary | ICD-10-CM | POA: Diagnosis not present

## 2018-06-17 DIAGNOSIS — R040 Epistaxis: Secondary | ICD-10-CM | POA: Diagnosis present

## 2018-06-17 DIAGNOSIS — Z8701 Personal history of pneumonia (recurrent): Secondary | ICD-10-CM

## 2018-06-17 DIAGNOSIS — K219 Gastro-esophageal reflux disease without esophagitis: Secondary | ICD-10-CM | POA: Diagnosis present

## 2018-06-17 DIAGNOSIS — Z79899 Other long term (current) drug therapy: Secondary | ICD-10-CM

## 2018-06-17 DIAGNOSIS — I130 Hypertensive heart and chronic kidney disease with heart failure and stage 1 through stage 4 chronic kidney disease, or unspecified chronic kidney disease: Secondary | ICD-10-CM | POA: Diagnosis not present

## 2018-06-17 DIAGNOSIS — Z87891 Personal history of nicotine dependence: Secondary | ICD-10-CM

## 2018-06-17 DIAGNOSIS — I509 Heart failure, unspecified: Secondary | ICD-10-CM

## 2018-06-17 DIAGNOSIS — Z800 Family history of malignant neoplasm of digestive organs: Secondary | ICD-10-CM

## 2018-06-17 DIAGNOSIS — Z89619 Acquired absence of unspecified leg above knee: Secondary | ICD-10-CM

## 2018-06-17 DIAGNOSIS — Z794 Long term (current) use of insulin: Secondary | ICD-10-CM

## 2018-06-17 DIAGNOSIS — G4733 Obstructive sleep apnea (adult) (pediatric): Secondary | ICD-10-CM | POA: Diagnosis present

## 2018-06-17 DIAGNOSIS — I5033 Acute on chronic diastolic (congestive) heart failure: Secondary | ICD-10-CM | POA: Diagnosis present

## 2018-06-17 HISTORY — DX: Pneumonia, unspecified organism: J18.9

## 2018-06-17 HISTORY — DX: Type 2 diabetes mellitus without complications: E11.9

## 2018-06-17 HISTORY — DX: Personal history of other diseases of the musculoskeletal system and connective tissue: Z87.39

## 2018-06-17 HISTORY — DX: Headache, unspecified: R51.9

## 2018-06-17 HISTORY — DX: Rheumatoid arthritis, unspecified: M06.9

## 2018-06-17 HISTORY — DX: Other chronic pain: G89.29

## 2018-06-17 HISTORY — DX: Obstructive sleep apnea (adult) (pediatric): G47.33

## 2018-06-17 HISTORY — DX: Headache: R51

## 2018-06-17 HISTORY — DX: Low back pain: M54.5

## 2018-06-17 HISTORY — DX: Low back pain, unspecified: M54.50

## 2018-06-17 HISTORY — DX: Dependence on other enabling machines and devices: Z99.89

## 2018-06-17 LAB — GLUCOSE, CAPILLARY: Glucose-Capillary: 125 mg/dL — ABNORMAL HIGH (ref 70–99)

## 2018-06-17 MED ORDER — ALBUTEROL SULFATE (2.5 MG/3ML) 0.083% IN NEBU: 2.5000 mg | INHALATION_SOLUTION | Freq: Four times a day (QID) | RESPIRATORY_TRACT | Status: DC | PRN

## 2018-06-17 MED ORDER — ONDANSETRON HCL 4 MG PO TABS: 4.0000 mg | ORAL_TABLET | Freq: Four times a day (QID) | ORAL | Status: DC | PRN

## 2018-06-17 MED ORDER — VANCOMYCIN HCL 10 G IV SOLR
2000.0000 mg | Freq: Once | INTRAVENOUS | Status: AC
  Administered 2018-06-18: 2000 mg via INTRAVENOUS
  Filled 2018-06-17: qty 2000

## 2018-06-17 MED ORDER — OXYCODONE HCL 5 MG PO TABS
5.0000 mg | ORAL_TABLET | ORAL | Status: DC | PRN
  Administered 2018-06-17 – 2018-06-22 (×10): 5 mg via ORAL
  Filled 2018-06-17 (×11): qty 1

## 2018-06-17 MED ORDER — ACETAMINOPHEN 325 MG PO TABS
650.0000 mg | ORAL_TABLET | Freq: Four times a day (QID) | ORAL | Status: DC | PRN
  Administered 2018-06-22 – 2018-06-26 (×4): 650 mg via ORAL
  Filled 2018-06-17 (×4): qty 2

## 2018-06-17 MED ORDER — ONDANSETRON HCL 4 MG/2ML IJ SOLN
4.0000 mg | Freq: Four times a day (QID) | INTRAMUSCULAR | Status: DC | PRN
  Administered 2018-06-20 – 2018-06-21 (×2): 4 mg via INTRAVENOUS
  Filled 2018-06-17 (×2): qty 2

## 2018-06-17 MED ORDER — INSULIN ASPART 100 UNIT/ML ~~LOC~~ SOLN
0.0000 [IU] | Freq: Three times a day (TID) | SUBCUTANEOUS | Status: DC
  Administered 2018-06-19 (×3): 1 [IU] via SUBCUTANEOUS
  Administered 2018-06-20: 2 [IU] via SUBCUTANEOUS
  Administered 2018-06-20: 1 [IU] via SUBCUTANEOUS
  Administered 2018-06-20 – 2018-06-21 (×4): 2 [IU] via SUBCUTANEOUS
  Administered 2018-06-22: 1 [IU] via SUBCUTANEOUS
  Administered 2018-06-22: 2 [IU] via SUBCUTANEOUS
  Administered 2018-06-22 – 2018-06-23 (×2): 1 [IU] via SUBCUTANEOUS
  Administered 2018-06-23: 2 [IU] via SUBCUTANEOUS
  Administered 2018-06-23 – 2018-06-25 (×6): 1 [IU] via SUBCUTANEOUS
  Administered 2018-06-25 – 2018-06-27 (×6): 2 [IU] via SUBCUTANEOUS

## 2018-06-17 MED ORDER — SODIUM CHLORIDE 0.9 % IV SOLN
2.0000 g | Freq: Three times a day (TID) | INTRAVENOUS | Status: DC
  Administered 2018-06-18: 2 g via INTRAVENOUS
  Filled 2018-06-17 (×2): qty 2

## 2018-06-17 MED ORDER — ACETAMINOPHEN 650 MG RE SUPP: 650.0000 mg | Freq: Four times a day (QID) | RECTAL | Status: DC | PRN

## 2018-06-17 MED ORDER — ENSURE ENLIVE PO LIQD
237.0000 mL | Freq: Two times a day (BID) | ORAL | Status: DC
  Administered 2018-06-19 – 2018-06-27 (×15): 237 mL via ORAL

## 2018-06-17 NOTE — H&P (View-Only) (Signed)
  Subjective:  Patient ID: [name], male    DOB: June 07, 1946,  MRN: [id number]  Chief Complaint  Patient presents with  . Foot Ulcer    left great toe - very swollen, very red - patient very concerned about infection    72 y.o. male presents  for wound check. States about two weeks ago a blister appeared on his foot and popped and the sore got much worse. Did not attempt coming in sooner. Endorses recent nausea, vomiting, diarrhea and general malaise for the past three days. Has not taken any antibiotics or sought care prior to today.  Past Medical History:  Diagnosis Date  . Chronic anticoagulation   . Chronic back pain   . Chronic chest pain   . Chronic venous insufficiency   . Diabetes mellitus without complication (HCC)   . Emphysema   . HTN (hypertension)   . Obesity   . PAF (paroxysmal atrial fibrillation) (HCC)   . Pneumothorax   . Rheumatoid arthritis(714.0)    No current outpatient medications on file.  Social History   Tobacco Use  Smoking Status Former Smoker  . Packs/day: 2.00  . Years: 30.00  . Pack years: 60.00  . Types: Cigarettes  . Last attempt to quit: 03/14/1996  . Years since quitting: 22.2  Smokeless Tobacco Never Used    No Known Allergies Objective:  There were no vitals filed for this visit. There is no height or weight on file to calculate BMI. Constitutional Well developed. Well nourished.  Vascular Dorsalis pedis pulses present 1+ bilaterally  Posterior tibial pulses absent bilaterally  Pedal hair growth absent. Capillary refill normal to all digits.  No cyanosis or clubbing noted.  Neurologic Normal speech. Oriented to person, place, and time. Epicritic sensation to light touch grossly present bilaterally. Protective sensation with 5.07 monofilament  absent bilaterally.  Dermatologic Nails elongated, thickened, dystrophic. Superficial ulcer right 2nd toe PIPJ. No probe to bone. Ulcer L hallux with necrosis, edema, probe to bone.  Erythema extending to the MPJ.  Orthopedic: Normal joint ROM without pain or crepitus bilaterally. No bony tenderness. Amputation R hallux noted. Hammertoes bilat   Assessment:   1. Diabetic ulcer of toe of left foot associated with diabetes mellitus due to underlying condition, with necrosis of bone (HCC)   2. Acute osteomyelitis of toe, left (HCC)   3. Bone erosion determined by x-ray   4. Cellulitis of toe, left   5. BMI 38.0-38.9,adult    Plan:  Patient was evaluated and treated and all questions answered.   Ulceration Right Hallux with Osteomyelitis -XR taken and reviewed showing osseous erosions -Clinical infection of the hallux noted today. Would benefit from admission for IV antibiotics and amputation of the digit. Patient understands plan and agrees to proceed. -Facilitated direct admission to the hospital for IV abx and for amputation. Will plan for procedure tomorrow. -Plan for right hallux amputation tomorrow.  40 minutes of face to face time were spent with the patient. >50% of this was spent on counseling and coordination of care. Specifically arranged direct admission and plans for surgery tomorrow.  No follow-ups on file.

## 2018-06-17 NOTE — H&P (Addendum)
History and Physical    Ricky SUSTAITA QMG:[id number] DOB: 06/15/1946 DOA: 06/17/2018  Referring MD/NP/PA: Merton Border, MD PCP: System, Pcp Not In  Patient coming from: Direct admission  Chief Complaint: Foot ulcer  I have personally briefly reviewed patient's old medical records in [place]   HPI: [name] is a 72 y.o. male with medical history significant of DM type II, HTN, COPD, PAF on xarelto, chronic venous insufficiency , and previous amputation of the right great toe; who presents with complaints of a left great toe ulcer.  Symptoms initially presented 2 weeks ago and the wound started as a blister that popped and has progressively gotten worse.  Reports wound wound with foul-smelling drainage.  Complains of a "heavy" pain in his left leg with progressively worsening swelling and erythema streaking up his inner thigh.  For the last 3 days or so patient reports having subjective fever, chills, nausea, vomiting, diarrhea, and general malaise.  Patient felt as though he may be coming down with the flu.  He had not been on any antibiotics.    Patient was seen by Dr. Jari Pigg of podiatry today for the ulceration on the left hallux for which the wound was able to be probed down to the bone.  X-rays were obtained showing osseous erosion concern for osteomyelitis of the left hallux.  Patient was sent to the hospital for further evaluation and IV antibiotics.  ED Course: As seen above  Review of Systems  Constitutional: Positive for chills and fever.  HENT: Negative for congestion and nosebleeds.   Eyes: Negative for photophobia and pain.  Respiratory: Positive for cough. Negative for shortness of breath.   Cardiovascular: Positive for leg swelling. Negative for chest pain.  Gastrointestinal: Positive for diarrhea, nausea and vomiting. Negative for blood in stool.  Genitourinary: Negative for dysuria and hematuria.  Musculoskeletal: Positive for joint pain and myalgias.  Skin:        Positive for skin color change  Neurological: Positive for sensory change. Negative for tremors and loss of consciousness.  Psychiatric/Behavioral: Negative for memory loss and substance abuse.    Past Medical History:  Diagnosis Date  . Chronic anticoagulation   . Chronic back pain   . Chronic chest pain   . Chronic venous insufficiency   . Diabetes mellitus without complication ([place])   . Emphysema   . HTN (hypertension)   . Obesity   . PAF (paroxysmal atrial fibrillation) ([place])   . Pneumothorax   . Rheumatoid arthritis(714.0)     Past Surgical History:  Procedure Laterality Date  . ablation    . AMPUTATION Right 07/23/2017   Procedure: right great toe amputation;  Surgeon: Marybelle Killings, MD;  Location: [facility];  Service: Orthopedics;  Laterality: Right;  . CARDIAC CATHETERIZATION  1998   NORMAL  . COLONOSCOPY WITH PROPOFOL N/A 08/25/2016   Procedure: COLONOSCOPY WITH PROPOFOL;  Surgeon: Laurence Spates, MD;  Location: [facility];  Service: Endoscopy;  Laterality: N/A;  . HERNIA REPAIR    . LEFT HEART CATH AND CORONARY ANGIOGRAPHY N/A 10/17/2016   Procedure: Left Heart Cath and Coronary Angiography;  Surgeon: Peter M Martinique, MD;  Location: [facility];  Service: Cardiovascular;  Laterality: N/A;  . PLEURAL SCARIFICATION    . VEIN LIGATION AND STRIPPING       reports that he quit smoking about 22 years ago. His smoking use included cigarettes. He has a 60.00 pack-year smoking history. He has never used smokeless  tobacco. He reports that he drinks alcohol. He reports that he does not use drugs.  No Known Allergies  Family History  Problem Relation Age of Onset  . Other Father        CEREBRAL THROMBOSIS  . Rheum arthritis Mother   . Bone cancer Mother   . Heart failure Brother 50  . Coronary artery disease Brother 45       PTCA  . ALS Sister   . Rheum arthritis Brother   . Rheum arthritis Brother     Prior to Admission medications   Medication Sig Start  Date End Date Taking? Authorizing Provider  amiodarone (PACERONE) 200 MG tablet TAKE 1 TABLET BY MOUTH  DAILY 03/03/18   Martinique, Peter M, MD  ANORO ELLIPTA 62.5-25 MCG/INH AEPB INHALE 1 PUFF INTO THE LUNGS DAILY. 02/26/18   Collene Gobble, MD  atenolol (TENORMIN) 50 MG tablet TAKE ONE TABLET BY MOUTH TWICE A DAY 01/11/18   Martinique, Peter M, MD  benazepril-hydrochlorthiazide (LOTENSIN HCT) 10-12.5 MG tablet  04/21/18   [provider]  diltiazem (CARDIZEM CD) 180 MG 24 hr capsule TAKE 1 CAPSULE BY MOUTH  DAILY 03/03/18   Martinique, Peter M, MD  furosemide (LASIX) 40 MG tablet  06/14/18   [provider]  hydrochlorothiazide (HYDRODIURIL) 25 MG tablet TAKE 1 TABLET BY MOUTH  DAILY 09/15/17   Martinique, Peter M, MD  HYDROcodone-acetaminophen (NORCO) 5-325 MG tablet Take 1 tablet by mouth 2 (two) times daily as needed for moderate pain. 08/05/17   Marybelle Killings, MD  metFORMIN (GLUCOPHAGE-XR) 500 MG 24 hr tablet Take 1 tablet by mouth 2 (two) times daily. 10/06/16   [provider]  omeprazole (PRILOSEC) 20 MG capsule TAKE 1 CAPSULE BY MOUTH  DAILY 03/03/18   Martinique, Peter M, MD  oxyCODONE (ROXICODONE) 5 MG immediate release tablet Take 1 tablet (5 mg total) by mouth every 6 (six) hours as needed for severe pain. 07/25/17 07/25/18  Barton Dubois, MD  potassium chloride SA (K-DUR,KLOR-CON) 20 MEQ tablet TAKE ONE TABLET BY MOUTH TWO TIMES A DAY 11/11/17   Martinique, Peter M, MD  PROAIR HFA 108 [phone] Base) MCG/ACT inhaler INHALE TWO PUFFS BY MOUTH EVERY 4 HOURS AS NEEDED FOR WHEEZING AND  SHORTNESS OF BREATH 02/23/17   Collene Gobble, MD  rosuvastatin (CRESTOR) 5 MG tablet  05/03/18   [provider]  XARELTO 20 MG TABS tablet TAKE ONE TABLET BY MOUTH DAILY WITH SUPPER 02/26/18   Martinique, Peter M, MD    Physical Exam:  Constitutional: Obese elderly male who appears to be in no acute distress at this time Vitals:   06/17/18 2016 06/17/18 2018  BP: (!) 118/48   Pulse: 63   Resp: 18   Temp:  98.5 F (36.9 C)   TempSrc: Oral   SpO2: 100%   Weight:  (!) 151 kg  Height:  _0  (1.981 m)   Eyes: PERRL, lids and conjunctivae normal ENMT: Mucous membranes are dry. Posterior pharynx clear of any exudate or lesions.  Neck: normal, supple, no masses, no thyromegaly Respiratory: clear to auscultation bilaterally, no wheezing, no crackles. Normal respiratory effort. No accessory muscle use.  Cardiovascular: Regular rate and rhythm, no murmurs / rubs / gallops.  +1 edema of the bilateral lower extremities. 1+ pedal pulses. No carotid bruits.  Abdomen: no tenderness, no masses palpated. No hepatosplenomegaly. Bowel sounds positive.  Musculoskeletal: no clubbing / cyanosis.  Right great toe amputation  Skin: Venous stasis noted  of the bilateral lower extremities. Ulceration of the left great toe with foul odor and erythema tracking up the leg.   Neurologic: CN 2-12 grossly intact. Sensation intact, DTR normal. Strength 5/5 in all 4.  Psychiatric: Normal judgment and insight. Alert and oriented x 3. Normal mood.     Labs on Admission: I have personally reviewed following labs and imaging studies  CBC: No results for input(s): WBC, NEUTROABS, HGB, HCT, MCV, PLT in the last 168 hours. Basic Metabolic Panel: No results for input(s): NA, K, CL, CO2, GLUCOSE, BUN, CREATININE, CALCIUM, MG, PHOS in the last 168 hours. GFR: CrCl cannot be calculated (Patient's most recent lab result is older than the maximum 21 days allowed.). Liver Function Tests: No results for input(s): AST, ALT, ALKPHOS, BILITOT, PROT, ALBUMIN in the last 168 hours. No results for input(s): LIPASE, AMYLASE in the last 168 hours. No results for input(s): AMMONIA in the last 168 hours. Coagulation Profile: No results for input(s): INR, PROTIME in the last 168 hours. Cardiac Enzymes: No results for input(s): CKTOTAL, CKMB, CKMBINDEX, TROPONINI in the last 168 hours. BNP (last 3 results) No results for input(s): PROBNP  in the last 8760 hours. HbA1C: No results for input(s): HGBA1C in the last 72 hours. CBG: No results for input(s): GLUCAP in the last 168 hours. Lipid Profile: No results for input(s): CHOL, HDL, LDLCALC, TRIG, CHOLHDL, LDLDIRECT in the last 72 hours. Thyroid Function Tests: No results for input(s): TSH, T4TOTAL, FREET4, T3FREE, THYROIDAB in the last 72 hours. Anemia Panel: No results for input(s): VITAMINB12, FOLATE, FERRITIN, TIBC, IRON, RETICCTPCT in the last 72 hours. Urine analysis:    Component Value Date/Time   COLORURINE YELLOW 08/23/2016 [place] 08/23/2016 0847   LABSPEC 1.015 08/23/2016 0847   PHURINE 8.0 08/23/2016 [place] 08/23/2016 0847   HGBUR NEGATIVE 08/23/2016 [place] 08/23/2016 0847   [place] 08/23/2016 0847   PROTEINUR NEGATIVE 08/23/2016 0847   NITRITE NEGATIVE 08/23/2016 0847   LEUKOCYTESUR TRACE (A) 08/23/2016 0847   Sepsis Labs: No results found for this or any previous visit (from the past 240 hour(s)).   Radiological Exams on Admission: Dg Foot Complete Left  Result Date: 06/17/2018 Please see detailed radiograph report in office note.   Reviewed outside records  Assessment/Plan  Diabetic foot ulcer of left great toe with osteomyelitis and cellulitis: Acute.  Patient presents as a direct admission with complaints of a 2-week history of worsening left great toe ulceration now with erythema.  Imaging studies obtained prior reveal changes suggestive of osteomyelitis. - Admit to a MedSurg bed - Lower extremity wound order set initiated - Check CBC, BMP, ESR, CRP, hemoglobin A1c, and prealbumin, - Check blood cultures - Empiric antibiotics of vancomycin and cefepime  - Discussed with patient's podiatrist in a.m.  Leukocytosis: WBC elevated to 29 on admission. - Check lactic acid and repeat CBC in a.m.  Acute blood loss anemia: On admission after initial lab work obtained hemoglobin  noted to be 9.7 previously noted to be 13.4. - Type and screen for possible need of blood products - Check stool guaiac - Recheck CBC this a.m. - Transfuse blood products as needed  Acute kidney injury on chronic kidney disease: Baseline kidney function previously noted to be around 1.4-1.5, but patient presents with a creatinine of 2.15 with BUN 43.  Suspect prerenal cause given recent report of nausea, vomiting, and diarrhea is likely cause - Bolus 1 L normal saline  IV fluids, and then place on rate of 100 mL/h - Check kidney function and  Nausea vomiting and diarrhea: Patient reports symptoms have started to improve. - Continue monitor intake and output  Diabetes mellitus type 2: Hemoglobin A1c noted to be 6.4.  Patient appears to be well controlled on oral medications. - Hypoglycemic protocol - Hold metformin - CBGs q. before meals with sensitive sliding scale insulin  Paroxysmal atrial fibrillation on chronic anticoagulant: Patient on Xarelto. - Hold Xarelto  Essential hypertension:  Patient presents with somewhat soft pressures. - Initially held blood pressure medications due to soft pressures - Reassess and start rate control/blood pressure medications when medically appropriate   OSA on CPAP - CPAP qhs  DVT prophylaxis: SCD Code Status: full Family Communication: No family present at bedside Disposition Plan: To be determined Consults called: none  Admission status: Inpatient  Norval Morton MD [facility] Pager [phone]   If 7PM-7AM, please contact night-coverage [email or web address] Password [facility]  06/17/2018, 9:43 PM

## 2018-06-17 NOTE — Progress Notes (Signed)
  Subjective:  Patient ID: [name], male    DOB: 10/19/1945,  MRN: [id number]  Chief Complaint  Patient presents with  . Foot Ulcer    left great toe - very swollen, very red - patient very concerned about infection    72 y.o. male presents  for wound check. States about two weeks ago a blister appeared on his foot and popped and the sore got much worse. Did not attempt coming in sooner. Endorses recent nausea, vomiting, diarrhea and general malaise for the past three days. Has not taken any antibiotics or sought care prior to today.  Past Medical History:  Diagnosis Date  . Chronic anticoagulation   . Chronic back pain   . Chronic chest pain   . Chronic venous insufficiency   . Diabetes mellitus without complication (H[facility])   . Emphysema   . HTN (hypertension)   . Obesity   . PAF (paroxysmal atrial fibrillation) ([facility])   . Pneumothorax   . Rheumatoid arthritis(714.0)    No current outpatient medications on file.  Social History   Tobacco Use  Smoking Status Former Smoker  . Packs/day: 2.00  . Years: 30.00  . Pack years: 60.00  . Types: Cigarettes  . Last attempt to quit: 03/14/1996  . Years since quitting: 22.2  Smokeless Tobacco Never Used    No Known Allergies Objective:  There were no vitals filed for this visit. There is no height or weight on file to calculate BMI. Constitutional Well developed. Well nourished.  Vascular Dorsalis pedis pulses present 1+ bilaterally  Posterior tibial pulses absent bilaterally  Pedal hair growth absent. Capillary refill normal to all digits.  No cyanosis or clubbing noted.  Neurologic Normal speech. Oriented to person, place, and time. Epicritic sensation to light touch grossly present bilaterally. Protective sensation with 5.07 monofilament  absent bilaterally.  Dermatologic Nails elongated, thickened, dystrophic. Superficial ulcer right 2nd toe PIPJ. No probe to bone. Ulcer L hallux with necrosis, edema, probe to bone.  Erythema extending to the MPJ.  Orthopedic: Normal joint ROM without pain or crepitus bilaterally. No bony tenderness. Amputation R hallux noted. Hammertoes bilat   Assessment:   1. Diabetic ulcer of toe of left foot associated with diabetes mellitus due to underlying condition, with necrosis of bone (HCC)   2. Acute osteomyelitis of toe, left (HCC)   3. Bone erosion determined by x-ray   4. Cellulitis of toe, left   5. BMI 38.0-38.9,adult    Plan:  Patient was evaluated and treated and all questions answered.   Ulceration Right Hallux with Osteomyelitis -XR taken and reviewed showing osseous erosions -Clinical infection of the hallux noted today. Would benefit from admission for IV antibiotics and amputation of the digit. Patient understands plan and agrees to proceed. -Facilitated direct admission to the hospital for IV abx and for amputation. Will plan for procedure tomorrow. -Plan for right hallux amputation tomorrow.  40 minutes of face to face time were spent with the patient. >50% of this was spent on counseling and coordination of care. Specifically arranged direct admission and plans for surgery tomorrow.  No follow-ups on file. 

## 2018-06-17 NOTE — Progress Notes (Signed)
Patient came in today to pick up diabetic shoes and custom inserts ALSO L5000 R.  Same was well pleased with fit and function.   The patient could ambulate without any discomfort; there were no signs of any quality issues. The foot ortheses offered full contact with plantar surface and contoured the arch well.   The shoes fit well with no heel slippage and areas of pressure concern.   Patient advised to contact us if any problems arise.  Patient also advised on how to report any issues.

## 2018-06-18 ENCOUNTER — Encounter (HOSPITAL_COMMUNITY)
Admission: AD | Disposition: A | Discharge status: Home / Self Care | Payer: Self-pay | Source: Home / Self Care | Attending: Internal Medicine

## 2018-06-18 ENCOUNTER — Encounter (HOSPITAL_COMMUNITY): Payer: Self-pay | Admitting: *Deleted

## 2018-06-18 ENCOUNTER — Inpatient Hospital Stay (HOSPITAL_COMMUNITY): Payer: Medicare Other

## 2018-06-18 ENCOUNTER — Inpatient Hospital Stay (HOSPITAL_COMMUNITY): Payer: Medicare Other | Admitting: Certified Registered Nurse Anesthetist

## 2018-06-18 ENCOUNTER — Inpatient Hospital Stay (HOSPITAL_COMMUNITY): Admission: AD | Admit: 2018-06-18 | Payer: Medicare Other | Source: Home / Self Care | Admitting: Podiatry

## 2018-06-18 ENCOUNTER — Telehealth: Payer: Self-pay | Admitting: Podiatry

## 2018-06-18 DIAGNOSIS — L03119 Cellulitis of unspecified part of limb: Secondary | ICD-10-CM

## 2018-06-18 DIAGNOSIS — M869 Osteomyelitis, unspecified: Secondary | ICD-10-CM | POA: Diagnosis present

## 2018-06-18 DIAGNOSIS — N179 Acute kidney failure, unspecified: Secondary | ICD-10-CM | POA: Diagnosis present

## 2018-06-18 DIAGNOSIS — L97524 Non-pressure chronic ulcer of other part of left foot with necrosis of bone: Secondary | ICD-10-CM

## 2018-06-18 DIAGNOSIS — N189 Chronic kidney disease, unspecified: Secondary | ICD-10-CM

## 2018-06-18 DIAGNOSIS — E08621 Diabetes mellitus due to underlying condition with foot ulcer: Secondary | ICD-10-CM

## 2018-06-18 DIAGNOSIS — E11628 Type 2 diabetes mellitus with other skin complications: Secondary | ICD-10-CM | POA: Diagnosis present

## 2018-06-18 HISTORY — PX: AMPUTATION TOE: SHX6595

## 2018-06-18 HISTORY — PX: BONE BIOPSY: SHX375

## 2018-06-18 HISTORY — PX: AMPUTATION: SHX166

## 2018-06-18 LAB — GLUCOSE, CAPILLARY
GLUCOSE-CAPILLARY: 109 mg/dL — AB (ref 70–99)
Glucose-Capillary: 123 mg/dL — ABNORMAL HIGH (ref 70–99)
Glucose-Capillary: 115 mg/dL — ABNORMAL HIGH (ref 70–99)
Glucose-Capillary: 114 mg/dL — ABNORMAL HIGH (ref 70–99)
Glucose-Capillary: 113 mg/dL — ABNORMAL HIGH (ref 70–99)

## 2018-06-18 LAB — SURGICAL PCR SCREEN
MRSA, PCR: NEGATIVE
Staphylococcus aureus: NEGATIVE

## 2018-06-18 LAB — CBC
HCT: 30.2 % — ABNORMAL LOW (ref 39.0–52.0)
Hemoglobin: 8.9 g/dL — ABNORMAL LOW (ref 13.0–17.0)
MCH: 26.3 pg (ref 26.0–34.0)
MCHC: 29.5 g/dL — ABNORMAL LOW (ref 30.0–36.0)
MCV: 89.3 fL (ref 80.0–100.0)
Platelets: 189 10*3/uL (ref 150–400)
RBC: 3.38 MIL/uL — ABNORMAL LOW (ref 4.22–5.81)
RDW: 17.4 % — AB (ref 11.5–15.5)
WBC: 24.5 10*3/uL — ABNORMAL HIGH (ref 4.0–10.5)
nRBC: 0.1 % (ref 0.0–0.2)

## 2018-06-18 LAB — CBC WITH DIFFERENTIAL/PLATELET
BAND NEUTROPHILS: 0 %
Basophils Absolute: 0 10*3/uL (ref 0.0–0.1)
Basophils Relative: 0 %
Blasts: 0 %
EOS ABS: 0.6 10*3/uL — AB (ref 0.0–0.5)
Eosinophils Relative: 2 %
HCT: 31.8 % — ABNORMAL LOW (ref 39.0–52.0)
Hemoglobin: 9.7 g/dL — ABNORMAL LOW (ref 13.0–17.0)
Lymphocytes Relative: 10 %
Lymphs Abs: 2.9 10*3/uL (ref 0.7–4.0)
MCH: 27.1 pg (ref 26.0–34.0)
MCHC: 30.5 g/dL (ref 30.0–36.0)
MCV: 88.8 fL (ref 80.0–100.0)
Metamyelocytes Relative: 0 %
Monocytes Absolute: 2.3 10*3/uL — ABNORMAL HIGH (ref 0.1–1.0)
Monocytes Relative: 8 %
Myelocytes: 0 %
Neutro Abs: 23.2 10*3/uL — ABNORMAL HIGH (ref 1.7–7.7)
Neutrophils Relative %: 80 %
Other: 0 %
Platelets: 206 10*3/uL (ref 150–400)
Promyelocytes Relative: 0 %
RBC: 3.58 MIL/uL — ABNORMAL LOW (ref 4.22–5.81)
RDW: 17.3 % — ABNORMAL HIGH (ref 11.5–15.5)
WBC: 29 10*3/uL — ABNORMAL HIGH (ref 4.0–10.5)
nRBC: 0 /100 WBC
nRBC: 0.1 % (ref 0.0–0.2)

## 2018-06-18 LAB — BASIC METABOLIC PANEL
Anion gap: 13 (ref 5–15)
Anion gap: 13 (ref 5–15)
BUN: 45 mg/dL — ABNORMAL HIGH (ref 8–23)
BUN: 43 mg/dL — AB (ref 8–23)
CALCIUM: 8.6 mg/dL — AB (ref 8.9–10.3)
CO2: 20 mmol/L — ABNORMAL LOW (ref 22–32)
CO2: 20 mmol/L — ABNORMAL LOW (ref 22–32)
CREATININE: 2.15 mg/dL — AB (ref 0.61–1.24)
Calcium: 9 mg/dL (ref 8.9–10.3)
Chloride: 104 mmol/L (ref 98–111)
Chloride: 107 mmol/L (ref 98–111)
Creatinine, Ser: 2.06 mg/dL — ABNORMAL HIGH (ref 0.61–1.24)
GFR calc Af Amer: 34 mL/min — ABNORMAL LOW (ref >60)
GFR calc Af Amer: 36 mL/min — ABNORMAL LOW (ref >60)
GFR calc non Af Amer: 30 mL/min — ABNORMAL LOW (ref >60)
GFR calc non Af Amer: 31 mL/min — ABNORMAL LOW (ref >60)
Glucose, Bld: 124 mg/dL — ABNORMAL HIGH (ref 70–99)
Glucose, Bld: 128 mg/dL — ABNORMAL HIGH (ref 70–99)
Potassium: 4.3 mmol/L (ref 3.5–5.1)
Potassium: 4.5 mmol/L (ref 3.5–5.1)
Sodium: 137 mmol/L (ref 135–145)
Sodium: 140 mmol/L (ref 135–145)

## 2018-06-18 LAB — LACTIC ACID, PLASMA
Lactic Acid, Venous: 1.3 mmol/L (ref 0.5–1.9)
Lactic Acid, Venous: 2 mmol/L (ref 0.5–1.9)

## 2018-06-18 LAB — HEMOGLOBIN A1C
Hgb A1c MFr Bld: 6.4 % — ABNORMAL HIGH (ref 4.8–5.6)
Mean Plasma Glucose: 136.98 mg/dL

## 2018-06-18 LAB — PREALBUMIN: PREALBUMIN: 15.4 mg/dL — AB (ref 18–38)

## 2018-06-18 LAB — ABO/RH: ABO/RH(D): A NEG

## 2018-06-18 LAB — PROTIME-INR
INR: 2.5
Prothrombin Time: 26.6 seconds — ABNORMAL HIGH (ref 11.4–15.2)

## 2018-06-18 LAB — INFLUENZA PANEL BY PCR (TYPE A & B)
INFLAPCR: NEGATIVE
Influenza B By PCR: NEGATIVE

## 2018-06-18 LAB — SEDIMENTATION RATE: Sed Rate: 84 mm/hr — ABNORMAL HIGH (ref 0–16)

## 2018-06-18 LAB — C-REACTIVE PROTEIN: CRP: 16.8 mg/dL — AB (ref <1.0)

## 2018-06-18 LAB — TYPE AND SCREEN
ABO/RH(D): A NEG
Antibody Screen: NEGATIVE

## 2018-06-18 LAB — APTT: aPTT: 51 seconds — ABNORMAL HIGH (ref 24–36)

## 2018-06-18 SURGERY — AMPUTATION DIGIT
Anesthesia: Monitor Anesthesia Care | Site: Toe | Laterality: Left

## 2018-06-18 MED ORDER — SODIUM CHLORIDE 0.9 % IV BOLUS
1000.0000 mL | Freq: Once | INTRAVENOUS | Status: AC
  Administered 2018-06-18: 1000 mL via INTRAVENOUS

## 2018-06-18 MED ORDER — 0.9 % SODIUM CHLORIDE (POUR BTL) OPTIME
TOPICAL | Status: DC | PRN
  Administered 2018-06-18: 1000 mL

## 2018-06-18 MED ORDER — OXYCODONE HCL 5 MG PO TABS: 5.0000 mg | ORAL_TABLET | Freq: Four times a day (QID) | ORAL | Status: DC | PRN

## 2018-06-18 MED ORDER — VANCOMYCIN HCL 10 G IV SOLR
1750.0000 mg | INTRAVENOUS | Status: DC
  Administered 2018-06-18 – 2018-06-20 (×3): 1750 mg via INTRAVENOUS
  Filled 2018-06-18 (×3): qty 1750

## 2018-06-18 MED ORDER — ONDANSETRON HCL 4 MG/2ML IJ SOLN
INTRAMUSCULAR | Status: AC
  Filled 2018-06-18: qty 2

## 2018-06-18 MED ORDER — SODIUM CHLORIDE 0.9 % IV SOLN
INTRAVENOUS | Status: DC | PRN
  Administered 2018-06-18: 25 ug/min via INTRAVENOUS
  Administered 2018-06-18: 55 ug/min via INTRAVENOUS
  Administered 2018-06-18: 45 ug/min via INTRAVENOUS

## 2018-06-18 MED ORDER — SODIUM CHLORIDE 0.9 % IV SOLN
2.0000 g | Freq: Two times a day (BID) | INTRAVENOUS | Status: DC
  Administered 2018-06-18 – 2018-06-21 (×7): 2 g via INTRAVENOUS
  Filled 2018-06-18 (×7): qty 2

## 2018-06-18 MED ORDER — AMIODARONE HCL 100 MG PO TABS
200.0000 mg | ORAL_TABLET | Freq: Every day | ORAL | Status: DC
  Administered 2018-06-19 – 2018-06-27 (×9): 200 mg via ORAL
  Filled 2018-06-18 (×9): qty 2

## 2018-06-18 MED ORDER — ONDANSETRON HCL 4 MG/2ML IJ SOLN
INTRAMUSCULAR | Status: DC | PRN
  Administered 2018-06-18: 4 mg via INTRAVENOUS

## 2018-06-18 MED ORDER — FENTANYL CITRATE (PF) 250 MCG/5ML IJ SOLN
INTRAMUSCULAR | Status: AC
  Filled 2018-06-18: qty 5

## 2018-06-18 MED ORDER — ADULT MULTIVITAMIN W/MINERALS CH
1.0000 | ORAL_TABLET | Freq: Every day | ORAL | Status: DC
  Administered 2018-06-19 – 2018-06-27 (×8): 1 via ORAL
  Filled 2018-06-18 (×9): qty 1

## 2018-06-18 MED ORDER — FENTANYL CITRATE (PF) 100 MCG/2ML IJ SOLN
25.0000 ug | INTRAMUSCULAR | Status: DC | PRN
  Administered 2018-06-18: 50 ug via INTRAVENOUS

## 2018-06-18 MED ORDER — PROPOFOL 500 MG/50ML IV EMUL
INTRAVENOUS | Status: DC | PRN
  Administered 2018-06-18: 150 ug/kg/min via INTRAVENOUS

## 2018-06-18 MED ORDER — ROSUVASTATIN CALCIUM 5 MG PO TABS
5.0000 mg | ORAL_TABLET | Freq: Every day | ORAL | Status: DC
  Administered 2018-06-18 – 2018-06-26 (×9): 5 mg via ORAL
  Filled 2018-06-18 (×9): qty 1

## 2018-06-18 MED ORDER — BUPIVACAINE HCL 0.5 % IJ SOLN
INTRAMUSCULAR | Status: DC | PRN
  Administered 2018-06-18: 10 mL

## 2018-06-18 MED ORDER — LIDOCAINE HCL (CARDIAC) PF 100 MG/5ML IV SOSY
PREFILLED_SYRINGE | INTRAVENOUS | Status: DC | PRN
  Administered 2018-06-18: 60 mg via INTRATRACHEAL

## 2018-06-18 MED ORDER — UMECLIDINIUM-VILANTEROL 62.5-25 MCG/INH IN AEPB
1.0000 | INHALATION_SPRAY | Freq: Every day | RESPIRATORY_TRACT | Status: DC
  Administered 2018-06-20 – 2018-06-24 (×5): 1 via RESPIRATORY_TRACT
  Filled 2018-06-18: qty 14

## 2018-06-18 MED ORDER — OXYCODONE HCL 5 MG/5ML PO SOLN: 5.0000 mg | Freq: Once | ORAL | Status: DC | PRN

## 2018-06-18 MED ORDER — LORAZEPAM 1 MG PO TABS: 1.0000 mg | ORAL_TABLET | Freq: Four times a day (QID) | ORAL | Status: AC | PRN

## 2018-06-18 MED ORDER — FENTANYL CITRATE (PF) 250 MCG/5ML IJ SOLN
INTRAMUSCULAR | Status: DC | PRN
  Administered 2018-06-18: 12.5 ug via INTRAVENOUS

## 2018-06-18 MED ORDER — FOLIC ACID 1 MG PO TABS
1.0000 mg | ORAL_TABLET | Freq: Every day | ORAL | Status: DC
  Administered 2018-06-19 – 2018-06-27 (×9): 1 mg via ORAL
  Filled 2018-06-18 (×9): qty 1

## 2018-06-18 MED ORDER — LORAZEPAM 2 MG/ML IJ SOLN: 1.0000 mg | Freq: Four times a day (QID) | INTRAMUSCULAR | Status: AC | PRN

## 2018-06-18 MED ORDER — FENTANYL CITRATE (PF) 100 MCG/2ML IJ SOLN
INTRAMUSCULAR | Status: AC
  Filled 2018-06-18: qty 2

## 2018-06-18 MED ORDER — PHENYLEPHRINE HCL 10 MG/ML IJ SOLN
INTRAMUSCULAR | Status: DC | PRN
  Administered 2018-06-18: 40 ug via INTRAVENOUS
  Administered 2018-06-18: 80 ug via INTRAVENOUS

## 2018-06-18 MED ORDER — LIDOCAINE 2% (20 MG/ML) 5 ML SYRINGE
INTRAMUSCULAR | Status: AC
  Filled 2018-06-18: qty 5

## 2018-06-18 MED ORDER — SODIUM CHLORIDE 0.9 % IV SOLN
INTRAVENOUS | Status: DC
  Administered 2018-06-18 – 2018-06-22 (×7): via INTRAVENOUS

## 2018-06-18 MED ORDER — OXYCODONE HCL 5 MG PO TABS: 5.0000 mg | ORAL_TABLET | Freq: Once | ORAL | Status: DC | PRN

## 2018-06-18 MED ORDER — EPHEDRINE SULFATE 50 MG/ML IJ SOLN
INTRAMUSCULAR | Status: DC | PRN
  Administered 2018-06-18: 5 mg via INTRAVENOUS

## 2018-06-18 MED ORDER — VITAMIN B-1 100 MG PO TABS
100.0000 mg | ORAL_TABLET | Freq: Every day | ORAL | Status: DC
  Administered 2018-06-19 – 2018-06-27 (×9): 100 mg via ORAL
  Filled 2018-06-18 (×9): qty 1

## 2018-06-18 MED ORDER — DILTIAZEM HCL ER COATED BEADS 180 MG PO CP24
180.0000 mg | ORAL_CAPSULE | Freq: Every day | ORAL | Status: DC
  Administered 2018-06-19 – 2018-06-27 (×9): 180 mg via ORAL
  Filled 2018-06-18 (×9): qty 1

## 2018-06-18 MED ORDER — LACTATED RINGERS IV SOLN
INTRAVENOUS | Status: DC
  Administered 2018-06-18: 13:00:00 via INTRAVENOUS

## 2018-06-18 MED ORDER — VANCOMYCIN HCL 1000 MG IV SOLR
INTRAVENOUS | Status: DC | PRN
  Administered 2018-06-18: 1000 mg via TOPICAL

## 2018-06-18 MED ORDER — MORPHINE SULFATE (PF) 2 MG/ML IV SOLN: 2.0000 mg | INTRAVENOUS | Status: DC | PRN

## 2018-06-18 MED ORDER — THIAMINE HCL 100 MG/ML IJ SOLN
100.0000 mg | Freq: Every day | INTRAMUSCULAR | Status: DC
  Administered 2018-06-18: 100 mg via INTRAVENOUS
  Filled 2018-06-18: qty 2

## 2018-06-18 MED ORDER — ONDANSETRON HCL 4 MG/2ML IJ SOLN: 4.0000 mg | Freq: Four times a day (QID) | INTRAMUSCULAR | Status: DC | PRN

## 2018-06-18 SURGICAL SUPPLY — 39 items
BANDAGE ACE 4X5 VEL STRL LF (GAUZE/BANDAGES/DRESSINGS) ×3 IMPLANT
BANDAGE ELASTIC 4 VELCRO ST LF (GAUZE/BANDAGES/DRESSINGS) ×2 IMPLANT
BLADE LONG MED 31MMX9MM (MISCELLANEOUS) ×1
BLADE LONG MED 31X9 (MISCELLANEOUS) ×1 IMPLANT
BNDG CMPR MED 10X6 ELC LF (GAUZE/BANDAGES/DRESSINGS) ×1
BNDG ELASTIC 6X10 VLCR STRL LF (GAUZE/BANDAGES/DRESSINGS) ×2 IMPLANT
BNDG GAUZE ELAST 4 BULKY (GAUZE/BANDAGES/DRESSINGS) ×3 IMPLANT
COVER SURGICAL LIGHT HANDLE (MISCELLANEOUS) ×3 IMPLANT
COVER WAND RF STERILE (DRAPES) ×3 IMPLANT
DRSG EMULSION OIL 3X3 NADH (GAUZE/BANDAGES/DRESSINGS) ×3 IMPLANT
ELECT CAUTERY BLADE 6.4 (BLADE) ×3 IMPLANT
ELECT REM PT RETURN 9FT ADLT (ELECTROSURGICAL) ×3
ELECTRODE REM PT RTRN 9FT ADLT (ELECTROSURGICAL) ×1 IMPLANT
GAUZE SPONGE 4X4 12PLY STRL (GAUZE/BANDAGES/DRESSINGS) ×3 IMPLANT
GAUZE SPONGE 4X4 12PLY STRL LF (GAUZE/BANDAGES/DRESSINGS) ×2 IMPLANT
GAUZE XEROFORM 1X8 LF (GAUZE/BANDAGES/DRESSINGS) ×2 IMPLANT
GLOVE BIO SURGEON STRL SZ7.5 (GLOVE) ×3 IMPLANT
GLOVE BIOGEL PI IND STRL 8 (GLOVE) ×1 IMPLANT
GLOVE BIOGEL PI INDICATOR 8 (GLOVE) ×2
GOWN STRL REUS W/ TWL LRG LVL3 (GOWN DISPOSABLE) ×1 IMPLANT
GOWN STRL REUS W/ TWL XL LVL3 (GOWN DISPOSABLE) ×1 IMPLANT
GOWN STRL REUS W/TWL LRG LVL3 (GOWN DISPOSABLE) ×3
GOWN STRL REUS W/TWL XL LVL3 (GOWN DISPOSABLE) ×3
KIT BASIN OR (CUSTOM PROCEDURE TRAY) ×3 IMPLANT
KIT TURNOVER KIT B (KITS) ×3 IMPLANT
NDL HYPO 25GX1X1/2 BEV (NEEDLE) ×1 IMPLANT
NEEDLE HYPO 25GX1X1/2 BEV (NEEDLE) ×3 IMPLANT
NS IRRIG 1000ML POUR BTL (IV SOLUTION) ×3 IMPLANT
PACK ORTHO EXTREMITY (CUSTOM PROCEDURE TRAY) ×3 IMPLANT
PAD ARMBOARD 7.5X6 YLW CONV (MISCELLANEOUS) ×3 IMPLANT
PADDING CAST COTTON 6X4 STRL (CAST SUPPLIES) ×2 IMPLANT
SET CYSTO W/LG BORE CLAMP LF (SET/KITS/TRAYS/PACK) ×2 IMPLANT
SOL PREP POV-IOD 4OZ 10% (MISCELLANEOUS) ×3 IMPLANT
SUT ETHILON 3 0 PS 1 (SUTURE) ×3 IMPLANT
SYR CONTROL 10ML LL (SYRINGE) ×3 IMPLANT
TOWEL OR 17X26 10 PK STRL BLUE (TOWEL DISPOSABLE) ×3 IMPLANT
TUBE CONNECTING 12'X1/4 (SUCTIONS) ×2
TUBE CONNECTING 12X1/4 (SUCTIONS) ×3 IMPLANT
YANKAUER SUCT BULB TIP NO VENT (SUCTIONS) ×3 IMPLANT

## 2018-06-18 NOTE — Progress Notes (Signed)
Pt returned to room 5N10 after surgery. Received report from [place], [place] in PACU. See reassessment. Will continue to monitor.

## 2018-06-18 NOTE — Progress Notes (Signed)
Office for Samuella Cota, MD called regarding pt's surgical plan. Awaiting MD response. Will continue to monitor.

## 2018-06-18 NOTE — Interval H&P Note (Signed)
History and Physical Interval Note:  06/18/2018 1:34 PM  [name]  has presented today for surgery, with the diagnosis of Osteomyelitis of great toe of left foot   The various methods of treatment have been discussed with the patient and family. After consideration of risks, benefits and other options for treatment, the patient has consented to  Procedure(s): AMPUTATION LEFT GREAT TOE (Left) as a surgical intervention .  The patient's history has been reviewed, patient examined, no change in status, stable for surgery.  I have reviewed the patient's chart and labs.  Questions were answered to the patient's satisfaction.     [name]

## 2018-06-18 NOTE — Brief Op Note (Signed)
06/18/2018  2:23 PM  PATIENT:  Carie Caddy  72 y.o. male  PRE-OPERATIVE DIAGNOSIS:  Osteomyelitis of great toe of left foot   POST-OPERATIVE DIAGNOSIS:  Osteomyelitis of great toe of left foot   PROCEDURE:  Procedure(s): AMPUTATION LEFT GREAT TOE INTERPHALYNGEAL JOINT (Left) PROXIMAL PHALYNX RESECTION (Left) BONE BIOPSY LEFT GREAT TOE (Left)  SURGEON:  Surgeon(s) and Role:    * Evelina Bucy, DPM - Primary  PHYSICIAN ASSISTANT:   ASSISTANTS: none   ANESTHESIA:   local and MAC  EBL:  20 mL   BLOOD ADMINISTERED:none  DRAINS: none   LOCAL MEDICATIONS USED:  MARCAINE    and Amount: 10 ml  SPECIMEN:   ID Type Source Tests Collected by Time Destination  1 : left great toe bone biopsy Tissue Bone SURGICAL PATHOLOGY Evelina Bucy, DPM 06/18/2018 1347   2 : left partial great toe Amputation Toe, Left SURGICAL PATHOLOGY Evelina Bucy, DPM 06/18/2018 1352   A : soft tissue left foot Amputation Toe, Left CULTURE, FUNGUS WITHOUT SMEAR, AEROBIC/ANAEROBIC CULTURE (SURGICAL/DEEP WOUND) Evelina Bucy, DPM 06/18/2018 1353       DISPOSITION OF SPECIMEN:  path and micro  COUNTS:  YES  TOURNIQUET:  * No tourniquets in log *  DICTATION: .Note written in EPIC  PLAN OF CARE: Transfer to floor  PATIENT DISPOSITION:  PACU - hemodynamically stable.   Delay start of Pharmacological VTE agent (>24hrs) due to surgical blood loss or risk of bleeding: no

## 2018-06-18 NOTE — Consult Note (Signed)
WOC Nurse wound consult note Reason for Consult: Consult received for left great toe wound. Patient with osteomyelitis and is scheduled for amputation of this digit, however this procedure may or may not occur today. I will provide conservative care orders until surgery occurs. Wound type: Infectious, neuropathic Pressure Injury POA: N/A Measurement: 2cm x 2.6cm x 0.1cm with red, moist wound bed, nongranulating Wound bed:As described above Drainage (amount, consistency, odor): small amount serosanguinous exudate on old dressing Periwound: edematous, erythematous, warm Dressing procedure/placement/frequency: I will provide Nursing with orders for twice daily wound care until surgical procedure is preformed consisting of cleansing with NS, patting dry and covering with a piece of xeroform gauze prior to dressing with dry dressings and topping with an ACE wrap.  I performed the wound care this morning.  WOC nursing team will not follow, but will remain available to this patient, the nursing and medical teams.  Please re-consult if needed. Thanks, Ladona Mow, MSN, RN, GNP, [place]  Pager# [phone]

## 2018-06-18 NOTE — Anesthesia Preprocedure Evaluation (Signed)
Anesthesia Evaluation  Patient identified by MRN, date of birth, ID band Patient awake    Reviewed: Allergy & Precautions, H&P , NPO status , Patient's Chart, lab work & pertinent test results  Airway Mallampati: II   Neck ROM: full    Dental   Pulmonary shortness of breath, sleep apnea , COPD, former smoker,    breath sounds clear to auscultation       Cardiovascular hypertension,  Rhythm:regular Rate:Normal     Neuro/Psych  Headaches,    GI/Hepatic GERD  ,  Endo/Other  diabetes, Type 2obese  Renal/GU Renal InsufficiencyRenal disease     Musculoskeletal  (+) Arthritis ,   Abdominal   Peds  Hematology   Anesthesia Other Findings   Reproductive/Obstetrics                             Anesthesia Physical Anesthesia Plan  ASA: III  Anesthesia Plan: MAC   Post-op Pain Management:    Induction: Intravenous  PONV Risk Score and Plan: 1 and Propofol infusion, Ondansetron and Treatment may vary due to age or medical condition  Airway Management Planned: Simple Face Mask  Additional Equipment:   Intra-op Plan:   Post-operative Plan:   Informed Consent: I have reviewed the patients History and Physical, chart, labs and discussed the procedure including the risks, benefits and alternatives for the proposed anesthesia with the patient or authorized representative who has indicated his/her understanding and acceptance.     Plan Discussed with: CRNA, Anesthesiologist and Surgeon  Anesthesia Plan Comments:         Anesthesia Quick Evaluation

## 2018-06-18 NOTE — Progress Notes (Signed)
Initial Nutrition Assessment  DOCUMENTATION CODES:   Obesity unspecified  INTERVENTION:    Continue Ensure Enlive po BID, each supplement provides 350 kcal and 20 grams of protein  Continue MVI daily  NUTRITION DIAGNOSIS:   Increased nutrient needs related to wound healing as evidenced by estimated needs.  GOAL:   Patient will meet greater than or equal to 90% of their needs  MONITOR:   PO intake, Supplement acceptance, Skin  REASON FOR ASSESSMENT:   Malnutrition Screening Tool    ASSESSMENT:   72 yo male with PMH of PAF, HTN, obesity, COPD, OSA, chronic venous insufficiency, R great toe amputation, RA, and DM-2 who was admitted on 12/5 with osteomyelitis of L great toe ulcer.  Plans for amputation of L great toe today. Patient currently in surgery, unable to speak with him or complete nutrition focused physical exam at this time.  Labs reviewed. Lactic acid 1.3-->2 CBG's: 123-115 Medications reviewed and include folic acid, novolog, MVI, thiamine, IV vancomycin.  Per review of weight encounters, no significant weight changes within the past year.    NUTRITION - FOCUSED PHYSICAL EXAM:  unable to complete NFPE at this time  Diet Order:   Diet Order            Diet NPO time specified Except for: Sips with Meds  Diet effective midnight              EDUCATION NEEDS:   Not appropriate for education at this time  Skin:  Skin Assessment: Skin Integrity Issues: Skin Integrity Issues:: Diabetic Ulcer Diabetic Ulcer: L great toe  Last BM:  12/5  Height:   Ht Readings from Last 1 Encounters:  06/18/18 6\' 6"  (1.981 m)    Weight:   Wt Readings from Last 1 Encounters:  06/18/18 (!) 151 kg    Ideal Body Weight:  97.3 kg  BMI:  Body mass index is 38.47 kg/m.  Estimated Nutritional Needs:   Kcal:  2400-2600  Protein:  150-170 gm  Fluid:  >/= 2.5 L    14/06/19, RD, LDN, CNSC Pager [phone] After Hours Pager [phone]

## 2018-06-18 NOTE — Progress Notes (Signed)
Pharmacy Antibiotic Note  [name] is a 72 y.o. male admitted on 06/17/2018 with osteomyelitis.  Pharmacy has been consulted for Vancomycin/Cefepime dosing. WBC elevated. Noted renal dysfunction. Planning for right hallux amputation.   Plan: Vancomycin 2000 mg IV x 1, then given 1750 mg IV q24h -Estimated AUC: 503 Cefepime 2g IV q12h Trend WBC, temp, renal function  F/U infectious work-up Drug levels as indicated   Height: 6\' 6"  (198.1 cm) Weight: (!) 332 lb 14.3 oz (151 kg) IBW/kg (Calculated) : 91.4  Temp (24hrs), Avg:98.5 F (36.9 C), Min:98.4 F (36.9 C), Max:98.5 F (36.9 C)  Recent Labs  Lab 06/17/18 2330  WBC 29.0*  CREATININE 2.15*    Estimated Creatinine Clearance: 50.6 mL/min (A) (by C-G formula based on SCr of 2.15 mg/dL (H)).    No Known Allergies  14/05/19 06/18/2018 1:27 AM

## 2018-06-18 NOTE — Progress Notes (Signed)
Moderate drainage noted to LLE surgical site. ABD and ACE wrap applied. Pt tolerated well.  Attempted to call office for Samuella Cota, MD, but was unsuccessful. Janee Morn, MD paged. Awaiting MD response. Will continue to monitor.

## 2018-06-18 NOTE — Op Note (Signed)
Patient Name: [name] DOB: May 31, 1946  MRN: [id number]   Date of Service: 06/18/18    Surgeon: Dr. Hardie Pulley, DPM Assistants: None Pre-operative Diagnosis: Osteomyelitis Left Great Toe, Diabetic Ulcer with Necrosis of Bone Post-operative Diagnosis: same Procedures: 1) Bone Biopsy Left Great Toe 2) Amputation Left Great Toe - Interphalangeal Joint 3) Left Proximal Phalanx Resection  Pathology/Specimens: ID Type Source Tests Collected by Time Destination  1 : left great toe bone biopsy Tissue Bone SURGICAL PATHOLOGY Evelina Bucy, DPM 06/18/2018 1347   2 : left partial great toe Amputation Toe, Left SURGICAL PATHOLOGY Evelina Bucy, DPM 06/18/2018 1352   A : soft tissue left foot Amputation Toe, Left CULTURE, FUNGUS WITHOUT SMEAR, AEROBIC/ANAEROBIC CULTURE (SURGICAL/DEEP WOUND) Evelina Bucy, DPM 06/18/2018 1353    Anesthesia: MAC/local Hemostasis: Anatomic Estimated Blood Loss: 20 ml Materials: None Medications: 1 g Vancomycin powder Complications: None  Indications for Procedure:  This is a 72 y.o. male with a worsening diabetic ulcer to the left great toe.  He presented to the office yesterday with signs of osteomyelitis.  He was sent to the hospital for admission IV antibiotics and amputation.   Procedure in Detail: Patient was identified in pre-operative holding area. Formal consent was signed and the left lower extremity was marked. Patient was brought back to the operating room and placed on the operating room table in the supine position. Anesthesia was induced.    The extremity was prepped and draped in the usual sterile fashion. Timeout was taken to confirm patient name, laterality, and procedure prior to incision. Attention was then directed to the left great toe where a diabetic ulcer was noted to the distal tip of the toe.  The ulcer probed to bone.  A rongeur was used to biopsy the distal aspect of the bone to confirm osteomyelitis.  A fishmouth incision  was then made about the toe.  Dissection was carried down to bone.  The toe was disarticulated at the interphalangeal joint and the distal phalanx was collected for pathology.  The remaining bone appeared healthy and viable.  The wound edges were not able to be reapproximated and this phalangeal resection was indicated.  The phalanx was resected approximate midway with a sagittal saw.  The bone edges were rasped smooth.  The wound was then irrigated with approximately 2 L of normal saline.  1 g of vancomycin powder was then packed in the wound.  The wound was then closed with 3-0 nylon and skin staples. The foot was then dressed with xeroform, 4x4, kerlix, and ACE bandage. Patient tolerated the procedure well.   Disposition: Following a period of post-operative monitoring, patient will be transferred back to the floor.  He will likely be okay for discharge tomorrow after 24 hours of IV antibiotics.  Believed surgical cure of osteomyelitis with today's procedure.

## 2018-06-18 NOTE — Plan of Care (Signed)
  Problem: Pain Managment: Goal: General experience of comfort will improve Outcome: Progressing   Problem: Safety: Goal: Ability to remain free from injury will improve Outcome: Progressing   

## 2018-06-18 NOTE — Progress Notes (Signed)
PROGRESS NOTE    [name]  IZT:[id number] DOB: [birth year] DOA: 06/17/2018 PCP: [name]   Brief Narrative: Patient is a 72 year old gentleman history of type 2 diabetes, hypertension, COPD, paroxysmal atrial fibrillation on Xarelto, chronic venous insufficiency prior history of amputation of the right great toe presenting with complaints of left great toe ulcer.  Patient being followed by podiatry.  Patient saw a podiatrist on the day of admission for ulceration on the left hallux for which the wound was able to be probed down to the bone.  X-rays were obtained showing osseous erosion concerning for osteomyelitis of the left hallux.  Patient subsequently sent to the ED to be admitted for IV antibiotics and further management by podiatry.   Assessment & Plan:   Principal Problem:   Osteomyelitis of great toe of left foot (HCC) Active Problems:   Chronic anticoagulation   Acute blood loss anemia   DM2 (diabetes mellitus, type 2) (HCC)   Osteomyelitis (HCC)   Cellulitis in diabetic foot (HCC)   Acute kidney injury superimposed on chronic kidney disease (HCC)  1 diabetic foot ulcer of the left great toe with osteomyelitis and cellulitis Patient presented as a direct admission with complaints of 2-week history of worsening left great toe ulceration with erythema.  Concern for developing cellulitis.  Images obtained prior to admission suggestive of osteomyelitis.  Prealbumin level at 15.4.  CRP elevated at 16.8.  Sed rate pending.  To have a significant leukocytosis white count of 29.0.  Lactic acid now elevated at 2.  MRSA PCR negative.  Blood cultures obtained and are pending.  Continue empiric IV vancomycin and IV cefepime.  Patient is being followed by podiatry, Dr. Samuella Cota who is planning for amputation of the left hallux today 06/18/2018.  Will consult with ID for antibiotic recommendations and duration.  Podiatry following.  2.  Diabetes mellitus type  2 Hemoglobin A1c 6.4 on 06/17/2018.  CBG this morning was 123.  Continue sliding scale insulin.  Follow.  3.  Acute blood loss anemia Hemoglobin noted to be 9.7 on admission previously was 13.4.  Patient with no overt bleeding.  He had 8.9 this morning.  Follow H&H.  Transfusion threshold hemoglobin less than 7.  4.  Acute kidney injury on chronic kidney disease stage III Baseline kidney function previously noted to be 1.4-1.5.  Creatinine on admission noted to be 2.15.  Likely of prerenal azotemia in the setting of ACE inhibitor and diuretics.  Patient also noted to have some nausea emesis and diarrhea a few days prior to admission..  Creatinine down to 2.06 this morning.  ACE inhibitor and diuretics on hold.  Continue hydration.  Follow.  5.  Paroxysmal atrial fibrillation on chronic anticoagulation Continue home regimen of amiodarone and Cardizem for rate control.  Xarelto on hold in anticipation of surgery.  6.  Hypertension Patient noted to have borderline blood pressure on admission.  Antihypertensive medications on hold.  We will follow.  7.  Leukocytosis Likely secondary to problem #1.  Check a UA with cultures and sensitivities.  Blood cultures pending.  Continue empiric IV antibiotics.  Patient for left great toe amputation today per podiatry.  Follow.   DVT prophylaxis: Per podiatry.  Xarelto on hold. Code Status: Full Family Communication: Updated patient and wife at bedside. Disposition Plan: To be determined.   Consultants:   Podiatry: Dr. Samuella Cota  Procedures:   Plain films of the left foot 06/17/2018    Antimicrobials:  IV cefepime  06/18/2018  IV vancomycin 06/18/2018   Subjective: Patient sitting up in bed.  Denies any chest pain no shortness of breath.  Awaiting surgery.  Denies any bleeding.  States he has been through this before in the past.  Objective: Vitals:   06/17/18 2018 06/18/18 0032 06/18/18 0349 06/18/18 0829  BP:  (!) 115/47 (!) 111/39 (!)  112/41  Pulse:  64 66 65  Resp:  14 14 18   Temp:  98.4 F (36.9 C) 98.5 F (36.9 C) 98.6 F (37 C)  TempSrc:  Oral Oral   SpO2:  98% 93% 98%  Weight: (!) 151 kg     Height: 6\' 6"  (1.981 m)      No intake or output data in the 24 hours ending 06/18/18 1216 Filed Weights   06/17/18 2018  Weight: (!) 151 kg    Examination:  General exam: Appears calm and comfortable  Respiratory system: Clear to auscultation. Respiratory effort normal. Cardiovascular system: S1 & S2 heard, RRR. No JVD, murmurs, rubs, gallops or clicks. No pedal edema. Gastrointestinal system: Abdomen is nondistended, soft and nontender. No organomegaly or masses felt. Normal bowel sounds heard. Central nervous system: Alert and oriented. No focal neurological deficits. Extremities: Bilateral lower extremities with chronic venous stasis changes noted.  Left lower extremity wrapped in bandage.  2+ left lower extremity edema.  Trace to 1+ right lower extremity edema. Skin: Bilateral lower extremity chronic venous stasis changes.   Psychiatry: Judgement and insight appear normal. Mood & affect appropriate.     Data Reviewed: I have personally reviewed following labs and imaging studies  CBC: Recent Labs  Lab 06/17/18 2330 06/18/18 0614  WBC 29.0* 24.5*  NEUTROABS 23.2*  --   HGB 9.7* 8.9*  HCT 31.8* 30.2*  MCV 88.8 89.3  PLT 206 189   Basic Metabolic Panel: Recent Labs  Lab 06/17/18 2330 06/18/18 0614  NA 137 140  K 4.3 4.5  CL 104 107  CO2 20* 20*  GLUCOSE 128* 124*  BUN 43* 45*  CREATININE 2.15* 2.06*  CALCIUM 9.0 8.6*   GFR: Estimated Creatinine Clearance: 52.8 mL/min (A) (by C-G formula based on SCr of 2.06 mg/dL (H)). Liver Function Tests: No results for input(s): AST, ALT, ALKPHOS, BILITOT, PROT, ALBUMIN in the last 168 hours. No results for input(s): LIPASE, AMYLASE in the last 168 hours. No results for input(s): AMMONIA in the last 168 hours. Coagulation Profile: Recent Labs  Lab  06/17/18 2330  INR 2.50   Cardiac Enzymes: No results for input(s): CKTOTAL, CKMB, CKMBINDEX, TROPONINI in the last 168 hours. BNP (last 3 results) No results for input(s): PROBNP in the last 8760 hours. HbA1C: Recent Labs    06/17/18 2330  HGBA1C 6.4*   CBG: Recent Labs  Lab 06/17/18 2208 06/18/18 0637 06/18/18 1127  GLUCAP 125* 123* 115*   Lipid Profile: No results for input(s): CHOL, HDL, LDLCALC, TRIG, CHOLHDL, LDLDIRECT in the last 72 hours. Thyroid Function Tests: No results for input(s): TSH, T4TOTAL, FREET4, T3FREE, THYROIDAB in the last 72 hours. Anemia Panel: No results for input(s): VITAMINB12, FOLATE, FERRITIN, TIBC, IRON, RETICCTPCT in the last 72 hours. Sepsis Labs: Recent Labs  Lab 06/18/18 3474 06/18/18 0851  LATICACIDVEN 1.3 2.0*    Recent Results (from the past 240 hour(s))  Surgical pcr screen     Status: None   Collection Time: 06/18/18  7:09 AM  Result Value Ref Range Status   MRSA, PCR NEGATIVE NEGATIVE Final   Staphylococcus aureus NEGATIVE NEGATIVE Final  Comment: (NOTE) The Xpert SA Assay (FDA approved for NASAL specimens in patients 15 years of age and older), is one component of a comprehensive surveillance program. It is not intended to diagnose infection nor to guide or monitor treatment. Performed at [facility], [address], [place], [place] [address]          Radiology Studies: Dg Foot Complete Left  Result Date: 06/17/2018 Please see detailed radiograph report in office note.       Scheduled Meds: . feeding supplement (ENSURE ENLIVE)  237 mL Oral BID BM  . folic acid  1 mg Oral Daily  . insulin aspart  0-9 Units Subcutaneous TID WC  . multivitamin with minerals  1 tablet Oral Daily  . thiamine  100 mg Oral Daily   Or  . thiamine  100 mg Intravenous Daily   Continuous Infusions: . sodium chloride 100 mL/hr at 06/18/18 0641  . ceFEPime (MAXIPIME) IV 2 g (06/18/18 1050)  . vancomycin       LOS:  1 day    Time spent: 35 minutes    Ramiro Harvest, MD [facility] Pager [phone]  If 7PM-7AM, please contact night-coverage [email or web address] Password [email or web address] 06/18/2018, 12:16 PM

## 2018-06-18 NOTE — Telephone Encounter (Signed)
[name] from [place] called wanting to check to see if Dr. Samuella Cota was still thinking of doing surgery today on pt. She stated it had been mentioned but wasn't sure. Please give her a call back.

## 2018-06-18 NOTE — Progress Notes (Addendum)
CRITICAL VALUE ALERT  Critical Value:  Lactic acid 2.0  Date & Time Notied:  06/18/2018 at 1025  Provider Notified: Janee Morn, MD  Orders Received/Actions taken: awaiting MD response  1154: Janee Morn, MD returned page. Will plan to recheck bloodwork tomorrow am and continue IVF. Will continue to monitor.

## 2018-06-18 NOTE — Plan of Care (Signed)

## 2018-06-18 NOTE — Anesthesia Postprocedure Evaluation (Signed)
Anesthesia Post Note  Patient: [name]  Procedure(s) Performed: AMPUTATION LEFT GREAT TOE INTERPHALYNGEAL JOINT (Left Toe) PROXIMAL PHALYNX RESECTION (Left Toe) BONE BIOPSY LEFT GREAT TOE (Left Toe)     Patient location during evaluation: PACU Anesthesia Type: MAC Level of consciousness: awake and alert, awake and oriented Pain management: pain level controlled Vital Signs Assessment: post-procedure vital signs reviewed and stable Respiratory status: spontaneous breathing, nonlabored ventilation and respiratory function stable Cardiovascular status: stable and blood pressure returned to baseline Postop Assessment: no apparent nausea or vomiting Anesthetic complications: no    Last Vitals:  Vitals:   06/18/18 1455 06/18/18 1510  BP: (!) 121/46 (!) 117/47  Pulse: (!) 59 (!) 57  Resp: 17 16  Temp:  (!) 36.3 C  SpO2: 97% 95%    Last Pain:  Vitals:   06/18/18 1455  TempSrc:   PainSc: 3                  Catalina Gravel

## 2018-06-18 NOTE — Consult Note (Signed)
       [facility]    Date of Admission:  06/17/2018   Total days of antibiotics 2        Day 2 vancomycin         Day 2 cefepime                Reason for Consult: L great toe osteomyelitis     Referring Provider: [place]  Primary Care Provider: [place]   Assessment: 72 y.o. diabetic male now POD 0 following L partial great toe amputation due to acute osteomyelitis in the setting of deep ulceration with 3 week duration that probed to bone. Intraop bone biopsy/cultures were obtained, no gram stain back at this point to guide. Will continue IV cefepime and vancomycin for now pending these results.  Watch creatinine with vancomycin. Hoping to D/C this tomorrow and convert to oral therapy once we have some preliminary micro data back. Keeping in mind he unfortunately received pre-op antibiotics. Will plan on a few weeks of PO antibiotics to mop up any potential residual infection, however Dr. Price seems very hopeful that all infected bone has been removed.   Would D/C droplet precautions as his symptoms resolved and flu panel negative.   Plan: 1. Continue vanco + cefepime  2. Follow micro data to choose oral therapy  3. D/c droplet precautions   Will follow micro data and adjust PO therapy over weekend and provide final recommendations.    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 amiodarone  200 mg Oral Daily  . diltiazem  180 mg Oral Daily  . feeding supplement (ENSURE ENLIVE)  237 mL Oral BID BM  . fentaNYL      . folic acid   1 mg Oral Daily  . insulin aspart  0-9 Units Subcutaneous TID WC  . multivitamin with minerals  1 tablet Oral Daily  . rosuvastatin  5 mg Oral q1800  . thiamine  100 mg Oral Daily   Or  . thiamine  100 mg Intravenous Daily  . umeclidinium-vilanterol  1 puff Inhalation Daily    HPI: [name] is a 72 y.o. male with pmhx significant for T2DM, HTN, COPD, atrial fibrillation on chronic xarelto, chronic venous insufficiency, h/o R toe amputation.   Presented to hospital on 12/06 with complaints of left great toe ulcer. He reports that this has been a problem for 3 weeks now and started as a blister. Progressively has worsened with swelling, pain and erythema. He is followed by Dr. Price with podiatry and was seen the day of admission; the wound was found to probe down to bone, x-rays concerning for erosions d/t osteomyelitis.   Hospital work up revealed elevated CRP 16.8 ESR 86, leukocytosis 29K and lactic acid 2. Blood cultures were drawn and no growth to date. He was started on empiric vancomycin and cefepime IV. He was taken to the OR today by Dr. Price - operative note reviewed and feels like all infected bone involving the R great toe was removed. He   is starting to experience more pain following his surgery. Reports some fevers prior to admission and cough/runny nose however the URI symptoms have resolved.   Lab Results  Component Value Date   HGBA1C 6.4 (H) 06/17/2018    Review of Systems: Review of Systems  Constitutional: Positive for fever. Negative for chills, diaphoresis, malaise/fatigue and weight loss.  HENT:       PTA a few days of runny nose, sore throat and cough - all now resolved  Respiratory: Negative for cough.   Cardiovascular: Positive for leg swelling. Negative for chest pain.  Gastrointestinal: Negative for abdominal pain and diarrhea.  Genitourinary: Negative for dysuria and flank pain.  Musculoskeletal: Negative for myalgias.  Skin: Negative for rash.    Neurological: Negative for dizziness.  Psychiatric/Behavioral: The patient is not nervous/anxious.     Past Medical History:  Diagnosis Date  . Chronic anticoagulation   . Chronic chest pain   . Chronic lower back pain   . Chronic venous insufficiency   . Emphysema   . Headache    "couple times/week" (06/17/2018)  . History of gout   . HTN (hypertension)   . Obesity   . OSA on CPAP   . PAF (paroxysmal atrial fibrillation) ([place])   . Pneumonia ~ 1981 X 1  . Pneumothorax   . Rheumatoid arthritis ([place])   . Type II diabetes mellitus (HCC)     Social History   Tobacco Use  . Smoking status: Former Smoker    Packs/day: 2.00    Years: 30.00    Pack years: 60.00    Types: Cigarettes    Last attempt to quit: 03/14/1996    Years since quitting: 22.2  . Smokeless tobacco: Never Used  Substance Use Topics  . Alcohol use: Yes    Comment: 06/17/2018 "sip of mixed drink couple times/year"  . Drug use: No    Family History  Problem Relation Age of Onset  . Other Father        CEREBRAL THROMBOSIS  . Rheum arthritis Mother   . Bone cancer Mother   . Heart failure Brother 50  . Coronary artery disease Brother 42       PTCA  . ALS Sister   . Rheum arthritis Brother   . Rheum arthritis Brother    No Known Allergies  OBJECTIVE: Blood pressure (!) 106/44, pulse (!) 57, temperature (!) 97.3 F (36.3 C), resp. rate 17, height 6' 6" (1.981 m), weight (!) 151 kg, SpO2 97 %.  Physical Exam  Constitutional: He is oriented to person, place, and time. He appears well-developed.  Resting in bed. Appears uncomfortable.   HENT:  Mouth/Throat: Oropharynx is clear and moist and mucous membranes are normal. Normal dentition. No dental abscesses.  Eyes: No scleral icterus.  Neck: Normal range of motion.  Cardiovascular: Normal rate, regular rhythm and normal heart sounds.  Pulmonary/Chest: Effort normal and breath sounds normal. He has no wheezes.  Abdominal: Soft. He exhibits no  distension. There is no tenderness.  Musculoskeletal:  L foot wrapped in surgical ACE - bleeding noted from site.   Neurological: He is alert and oriented to person, place, and time.  Skin: Skin is warm and dry. No rash noted.  Psychiatric: He has a normal mood and affect. Judgment normal.    Lab Results Lab Results  Component Value Date   WBC 24.5 (H) 06/18/2018   HGB 8.9 (L) 06/18/2018   HCT 30.2 (L) 06/18/2018   MCV 89.3 06/18/2018  PLT 189 06/18/2018    Lab Results  Component Value Date   CREATININE 2.06 (H) 06/18/2018   BUN 45 (H) 06/18/2018   NA 140 06/18/2018   K 4.5 06/18/2018   CL 107 06/18/2018   CO2 20 (L) 06/18/2018    Lab Results  Component Value Date   ALT 15 (L) 07/22/2017   AST 21 07/22/2017   ALKPHOS 71 07/22/2017   BILITOT 1.4 (H) 07/22/2017     Microbiology: Recent Results (from the past 240 hour(s))  Blood Cultures x 2 sites     Status: None (Preliminary result)   Collection Time: 06/17/18 11:35 PM  Result Value Ref Range Status   Specimen Description BLOOD LEFT FOREARM  Final   Special Requests   Final    BOTTLES DRAWN AEROBIC AND ANAEROBIC Blood Culture adequate volume   Culture   Final    NO GROWTH < 24 HOURS Performed at [facility], [address] [place], [place] [address]    Report Status PENDING  Incomplete  Blood Cultures x 2 sites     Status: None (Preliminary result)   Collection Time: 06/17/18 11:47 PM  Result Value Ref Range Status   Specimen Description BLOOD RIGHT WRIST  Final   Special Requests   Final    BOTTLES DRAWN AEROBIC ONLY Blood Culture adequate volume   Culture   Final    NO GROWTH < 24 HOURS Performed at [facility], [address] [place], [place] [address]    Report Status PENDING  Incomplete  Surgical pcr screen     Status: None   Collection Time: 06/18/18  7:09 AM  Result Value Ref Range Status   MRSA, PCR NEGATIVE NEGATIVE Final   Staphylococcus aureus NEGATIVE NEGATIVE Final     Comment: (NOTE) The Xpert SA Assay (FDA approved for NASAL specimens in patients 22 years of age and older), is one component of a comprehensive surveillance program. It is not intended to diagnose infection nor to guide or monitor treatment. Performed at  Hospital Lab, [address] [place], [place] [address]     [name] Dixon, MSN, NP-C [facility] Cell: [phone] Pager: [phone]  06/18/2018 4:46 PM    

## 2018-06-18 NOTE — Transfer of Care (Signed)
Immediate Anesthesia Transfer of Care Note  Patient: [name]  Procedure(s) Performed: AMPUTATION LEFT GREAT TOE INTERPHALYNGEAL JOINT (Left Toe) PROXIMAL PHALYNX RESECTION (Left Toe) BONE BIOPSY LEFT GREAT TOE (Left Toe)  Patient Location: PACU  Anesthesia Type:MAC  Level of Consciousness: awake and patient cooperative  Airway & Oxygen Therapy: Patient Spontanous Breathing and Patient connected to face mask oxygen  Post-op Assessment: Report given to RN and Post -op Vital signs reviewed and stable  Post vital signs: Reviewed and stable  Last Vitals:  Vitals Value Taken Time  BP 112/47 06/18/2018  2:26 PM  Temp    Pulse 62 06/18/2018  2:27 PM  Resp 16 06/18/2018  2:27 PM  SpO2 95 % 06/18/2018  2:27 PM  Vitals shown include unvalidated device data.  Last Pain:  Vitals:   06/18/18 0815  TempSrc:   PainSc: 0-No pain      Patients Stated Pain Goal: 3 (09/81/19 1478)  Complications: No apparent anesthesia complications

## 2018-06-19 ENCOUNTER — Encounter (HOSPITAL_COMMUNITY): Payer: Self-pay | Admitting: Podiatry

## 2018-06-19 DIAGNOSIS — M869 Osteomyelitis, unspecified: Secondary | ICD-10-CM

## 2018-06-19 LAB — CBC WITH DIFFERENTIAL/PLATELET
ABS IMMATURE GRANULOCYTES: 0.44 10*3/uL — AB (ref 0.00–0.07)
BASOS PCT: 1 %
Basophils Absolute: 0.1 10*3/uL (ref 0.0–0.1)
Eosinophils Absolute: 0.5 10*3/uL (ref 0.0–0.5)
Eosinophils Relative: 3 %
HCT: 31 % — ABNORMAL LOW (ref 39.0–52.0)
Hemoglobin: 9 g/dL — ABNORMAL LOW (ref 13.0–17.0)
Immature Granulocytes: 2 %
Lymphocytes Relative: 12 %
Lymphs Abs: 2.3 10*3/uL (ref 0.7–4.0)
MCH: 26.5 pg (ref 26.0–34.0)
MCHC: 29 g/dL — ABNORMAL LOW (ref 30.0–36.0)
MCV: 91.2 fL (ref 80.0–100.0)
Monocytes Absolute: 1.4 10*3/uL — ABNORMAL HIGH (ref 0.1–1.0)
Monocytes Relative: 8 %
Neutro Abs: 14.2 10*3/uL — ABNORMAL HIGH (ref 1.7–7.7)
Neutrophils Relative %: 74 %
PLATELETS: 185 10*3/uL (ref 150–400)
RBC: 3.4 MIL/uL — ABNORMAL LOW (ref 4.22–5.81)
RDW: 17.3 % — ABNORMAL HIGH (ref 11.5–15.5)
WBC: 18.9 10*3/uL — ABNORMAL HIGH (ref 4.0–10.5)
nRBC: 0 % (ref 0.0–0.2)

## 2018-06-19 LAB — BASIC METABOLIC PANEL
Anion gap: 8 (ref 5–15)
BUN: 34 mg/dL — ABNORMAL HIGH (ref 8–23)
CO2: 23 mmol/L (ref 22–32)
Calcium: 8.8 mg/dL — ABNORMAL LOW (ref 8.9–10.3)
Chloride: 109 mmol/L (ref 98–111)
Creatinine, Ser: 1.72 mg/dL — ABNORMAL HIGH (ref 0.61–1.24)
GFR calc Af Amer: 45 mL/min — ABNORMAL LOW (ref >60)
GFR, EST NON AFRICAN AMERICAN: 39 mL/min — AB (ref >60)
Glucose, Bld: 140 mg/dL — ABNORMAL HIGH (ref 70–99)
Potassium: 5 mmol/L (ref 3.5–5.1)
SODIUM: 140 mmol/L (ref 135–145)

## 2018-06-19 LAB — GLUCOSE, CAPILLARY
GLUCOSE-CAPILLARY: 119 mg/dL — AB (ref 70–99)
Glucose-Capillary: 133 mg/dL — ABNORMAL HIGH (ref 70–99)
Glucose-Capillary: 142 mg/dL — ABNORMAL HIGH (ref 70–99)
Glucose-Capillary: 134 mg/dL — ABNORMAL HIGH (ref 70–99)

## 2018-06-19 LAB — LIPID PANEL
CHOLESTEROL: 84 mg/dL (ref 0–200)
HDL: 17 mg/dL — ABNORMAL LOW (ref >40)
LDL Cholesterol: 40 mg/dL (ref 0–99)
Total CHOL/HDL Ratio: 4.9 RATIO
Triglycerides: 134 mg/dL (ref <150)
VLDL: 27 mg/dL (ref 0–40)

## 2018-06-19 LAB — LACTIC ACID, PLASMA: Lactic Acid, Venous: 1.1 mmol/L (ref 0.5–1.9)

## 2018-06-19 NOTE — Progress Notes (Signed)
Monitoring cultures and no growth to date.  WBC improved.  Now S/p ampuation and cure.  Continue vanco/cefepime for another 24 hours.  Unless new growth, can go out with 5 days of Augmentin and stop.  Gardiner Barefoot, MD

## 2018-06-19 NOTE — Progress Notes (Addendum)
Patient Demographics:    [name], is a 72 y.o. male, DOB - 01/12/1946, [id number]  Admit date - 06/17/2018   Admitting Physician Clydie Braun, MD  Outpatient Primary MD for the patient is [name]  LOS - 2   No chief complaint on file.       Subjective:    [name] today has no fevers, no emesis,  No chest pain,  Voiding ok,   Assessment  & Plan :    Principal Problem:   Osteomyelitis of great toe of left foot (HCC) Active Problems:   Chronic anticoagulation   Acute blood loss anemia   DM2 (diabetes mellitus, type 2) (HCC)   Osteomyelitis (HCC)   Cellulitis in diabetic foot (HCC)   Acute kidney injury superimposed on chronic kidney disease (HCC)   Diabetic ulcer of toe of left foot associated with diabetes mellitus due to underlying condition, with necrosis of bone [facility])  Brief Summary 72 y.o. male with medical history significant of DM type II, HTN, COPD, PAF on xarelto, chronic venous insufficiency , and previous amputation of the right great toe admitted on 06/17/2018 with infected left great toe diabetic ulcer and osteomyelitis of Lt halux   Plan:- 1)Osteomyelitis Left Great Toe, Diabetic Ulcer with Necrosis of Bone-----status post amputation of left great toe on 06/18/2018, ????  Surgical cure of osteomyelitis with amputation, continue Vanco and cefepime for now, ID consult appreciated, surgical cultures pending, white count is 18.9  2)AKI----acute kidney injury on CKD stage - III    creatinine on admission= 2.1  ,   baseline creatinine =1.5    , creatinine is now= 1.72      , renally adjust medications, avoid nephrotoxic agents/dehydration/hypotension   3) acute blood loss anemia----baseline hemoglobin around 12, admission hemoglobin 9.7.Marland KitchenMarland Kitchen Hemoglobin is now down to 9.0 suspect some degree of hemodilution on postop blood loss... Monitor closely,  patient complains of some orthostatic dizziness, however no baseline tachycardia,  transfuse if more symptomatic or if hemoglobin drops close to 7  4)PAFib--- continue amiodarone and Cardizem for rate control, atenolol on hold, Xarelto on hold  5)DM2-last A1c 6.4, reflecting excellent diabetic control, metformin on hold, continue sliding scale insulin coverage  Disposition/Need for in-Hospital Stay- patient unable to be discharged at this time due to IV Vanco and Zosyn as recommended by infectious disease consultant pending culture results, also needs to be monitored for drop in H&H with orthostatic dizziness may need transfusion, patient also has AKI requiring continuous IV fluids*  Code Status : full  Disposition Plan  : home ??? HH RN   Consults  :  Podiatry/ID   DVT Prophylaxis  :   Lab Results  Component Value Date   PLT 185 06/19/2018    Inpatient Medications  Scheduled Meds: . amiodarone  200 mg Oral Daily  . diltiazem  180 mg Oral Daily  . feeding supplement (ENSURE ENLIVE)  237 mL Oral BID BM  . folic acid  1 mg Oral Daily  . insulin aspart  0-9 Units Subcutaneous TID WC  . multivitamin with minerals  1 tablet Oral Daily  . rosuvastatin  5 mg Oral q1800  . thiamine  100 mg Oral Daily   Or  .  thiamine  100 mg Intravenous Daily  . umeclidinium-vilanterol  1 puff Inhalation Daily   Continuous Infusions: . sodium chloride Stopped (06/19/18 0847)  . ceFEPime (MAXIPIME) IV Stopped (06/19/18 1058)  . lactated ringers 10 mL/hr at 06/18/18 1325  . vancomycin Stopped (06/19/18 0353)   PRN Meds:.acetaminophen **OR** acetaminophen, albuterol, LORazepam **OR** LORazepam, morphine injection, ondansetron **OR** ondansetron (ZOFRAN) IV, oxyCODONE    Anti-infectives (From admission, onward)   Start     Dose/Rate Route Frequency Ordered Stop   06/18/18 2200  vancomycin (VANCOCIN) 1,750 mg in sodium chloride 0.9 % 500 mL IVPB     1,750 mg 250 mL/hr over 120 Minutes  Intravenous Every 24 hours 06/18/18 0127     06/18/18 1413  vancomycin (VANCOCIN) powder  Status:  Discontinued       As needed 06/18/18 1413 06/18/18 1422   06/18/18 1000  ceFEPIme (MAXIPIME) 2 g in sodium chloride 0.9 % 100 mL IVPB     2 g 200 mL/hr over 30 Minutes Intravenous Every 12 hours 06/18/18 0127     06/18/18 0000  vancomycin (VANCOCIN) 2,000 mg in sodium chloride 0.9 % 500 mL IVPB     2,000 mg 250 mL/hr over 120 Minutes Intravenous  Once 06/17/18 2350 06/18/18 0326   06/18/18 0000  ceFEPIme (MAXIPIME) 2 g in sodium chloride 0.9 % 100 mL IVPB  Status:  Discontinued     2 g 200 mL/hr over 30 Minutes Intravenous Every 8 hours 06/17/18 2350 06/18/18 0127        Objective:   Vitals:   06/18/18 2121 06/19/18 0029 06/19/18 0449 06/19/18 1413  BP: (!) 98/42 (!) 113/49 90/72 (!) 130/45  Pulse: (!) 59 61 62 68  Resp:    18  Temp: 98.1 F (36.7 C)  98.6 F (37 C) 98.7 F (37.1 C)  TempSrc: Oral  Oral Oral  SpO2: 99% 100% 97% 97%  Weight:      Height:        Wt Readings from Last 3 Encounters:  06/18/18 (!) 151 kg  04/01/18 (!) 151.5 kg  01/05/18 (!) 149.2 kg     Intake/Output Summary (Last 24 hours) at 06/19/2018 1435 Last data filed at 06/19/2018 1100 Gross per 24 hour  Intake 2026.58 ml  Output 450 ml  Net 1576.58 ml     Physical Exam Patient is examined daily including today on 06/19/18 , exams remain the same as of yesterday except that has changed   Gen:- Awake Alert,  obese HEENT:- [place].AT, No sclera icterus Neck-Supple Neck,No JVD,.  Lungs-  CTAB , fair symmetrical air movement CV- S1, S2 normal, regular  Abd-  +ve B.Sounds, Abd Soft, No tenderness,    Extremity/Skin:-  pedal pulses present  Psych-affect is appropriate, oriented x3 Neuro-no new focal deficits, no tremors MSK-status post prior right big toe amputation, some left leg swelling, left foot postop area with dressing (status post Lt big toe amputation)   Data Review:   Micro Results Recent  Results (from the past 240 hour(s))  Blood Cultures x 2 sites     Status: None (Preliminary result)   Collection Time: 06/17/18 11:35 PM  Result Value Ref Range Status   Specimen Description BLOOD LEFT FOREARM  Final   Special Requests   Final    BOTTLES DRAWN AEROBIC AND ANAEROBIC Blood Culture adequate volume   Culture   Final    NO GROWTH < 24 HOURS Performed at [facility], [address] [place], [place]  [address]    Report Status PENDING  Incomplete  Blood Cultures x 2 sites     Status: None (Preliminary result)   Collection Time: 06/17/18 11:47 PM  Result Value Ref Range Status   Specimen Description BLOOD RIGHT WRIST  Final   Special Requests   Final    BOTTLES DRAWN AEROBIC ONLY Blood Culture adequate volume   Culture   Final    NO GROWTH < 24 HOURS Performed at [facility], [address], [place], [place] [address]    Report Status PENDING  Incomplete  Surgical pcr screen     Status: None   Collection Time: 06/18/18  7:09 AM  Result Value Ref Range Status   MRSA, PCR NEGATIVE NEGATIVE Final   Staphylococcus aureus NEGATIVE NEGATIVE Final    Comment: (NOTE) The Xpert SA Assay (FDA approved for NASAL specimens in patients 24 years of age and older), is one component of a comprehensive surveillance program. It is not intended to diagnose infection nor to guide or monitor treatment. Performed at [facility], [address] [place], [place] [address]   Aerobic/Anaerobic Culture (surgical/deep wound)     Status: None (Preliminary result)   Collection Time: 06/18/18  1:53 PM  Result Value Ref Range Status   Specimen Description TOE LEFT  Final   Special Requests NONE  Final   Gram Stain NO WBC SEEN NO ORGANISMS SEEN   Final   Culture   Final    TOO YOUNG TO READ Performed at [facility], [address], [place], [place] [address]    Report Status PENDING  Incomplete    Radiology Reports Dg Foot 2 Views Left  Result Date:  06/18/2018 CLINICAL DATA:  Post-operative state EXAM: LEFT FOOT - 2 VIEW COMPARISON:  None. FINDINGS: Status post amputation at the first proximal phalanx. Expected postsurgical changes within the surrounding soft tissues. Remainder of the osseous structures of the LEFT foot appear intact and normal in mineralization. IMPRESSION: Status post amputation at the first proximal phalanx. No evidence of surgical complicating feature. Electronically Signed   By: Bary Richard M.D.   On: 06/18/2018 15:02   Dg Foot Complete Left  Result Date: 06/17/2018 Please see detailed radiograph report in office note.    CBC Recent Labs  Lab 06/17/18 2330 06/18/18 0614 06/19/18 0534  WBC 29.0* 24.5* 18.9*  HGB 9.7* 8.9* 9.0*  HCT 31.8* 30.2* 31.0*  PLT 206 189 185  MCV 88.8 89.3 91.2  MCH 27.1 26.3 26.5  MCHC 30.5 29.5* 29.0*  RDW 17.3* 17.4* 17.3*  LYMPHSABS 2.9  --  2.3  MONOABS 2.3*  --  1.4*  EOSABS 0.6*  --  0.5  BASOSABS 0.0  --  0.1    Chemistries  Recent Labs  Lab 06/17/18 2330 06/18/18 0614 06/19/18 0534  NA 137 140 140  K 4.3 4.5 5.0  CL 104 107 109  CO2 20* 20* 23  GLUCOSE 128* 124* 140*  BUN 43* 45* 34*  CREATININE 2.15* 2.06* 1.72*  CALCIUM 9.0 8.6* 8.8*   ------------------------------------------------------------------------------------------------------------------ Recent Labs    06/19/18 0534  CHOL 84  HDL 17*  LDLCALC 40  TRIG 295  CHOLHDL 4.9    Lab Results  Component Value Date   HGBA1C 6.4 (H) 06/17/2018   ------------------------------------------------------------------------------------------------------------------ No results for input(s): TSH, T4TOTAL, T3FREE, THYROIDAB in the last 72 hours.  Invalid input(s): FREET3 ------------------------------------------------------------------------------------------------------------------ No results for input(s): VITAMINB12, FOLATE, FERRITIN, TIBC, IRON, RETICCTPCT in the  last 72 hours.  Coagulation  profile Recent Labs  Lab 06/17/18 2330  INR 2.50   ------------------------------------------------------------------------------------------------------------------    Component Value Date/Time   BNP 80.9 10/04/2010 1255     Cloey Sferrazza M.D on 06/19/2018 at 2:35 PM  Pager---[phone] Go to [email or web address] - password [email or web address] for contact info  [facility] - Office  [phone]

## 2018-06-19 NOTE — Plan of Care (Signed)
  Problem: Education: Goal: Knowledge of General Education information will improve Description: Including pain rating scale, medication(s)/side effects and non-pharmacologic comfort measures Outcome: Progressing   Problem: Health Behavior/Discharge Planning: Goal: Ability to manage health-related needs will improve Outcome: Progressing   Problem: Clinical Measurements: Goal: Will remain free from infection Outcome: Progressing   Problem: Nutrition: Goal: Adequate nutrition will be maintained Outcome: Progressing   Problem: Pain Managment: Goal: General experience of comfort will improve Outcome: Progressing   

## 2018-06-19 NOTE — Progress Notes (Signed)
Pt arrived. Direct admit. MD notified. Pt alert, able to make needs known. Wife at bedside.  Cindra Presume, RN

## 2018-06-20 LAB — CBC
HCT: 30.7 % — ABNORMAL LOW (ref 39.0–52.0)
Hemoglobin: 9.1 g/dL — ABNORMAL LOW (ref 13.0–17.0)
MCH: 26.6 pg (ref 26.0–34.0)
MCHC: 29.6 g/dL — ABNORMAL LOW (ref 30.0–36.0)
MCV: 89.8 fL (ref 80.0–100.0)
Platelets: 234 10*3/uL (ref 150–400)
RBC: 3.42 MIL/uL — ABNORMAL LOW (ref 4.22–5.81)
RDW: 17.2 % — ABNORMAL HIGH (ref 11.5–15.5)
WBC: 25 10*3/uL — ABNORMAL HIGH (ref 4.0–10.5)
nRBC: 0.1 % (ref 0.0–0.2)

## 2018-06-20 LAB — BASIC METABOLIC PANEL
Anion gap: 10 (ref 5–15)
BUN: 27 mg/dL — ABNORMAL HIGH (ref 8–23)
CO2: 20 mmol/L — ABNORMAL LOW (ref 22–32)
Calcium: 8.7 mg/dL — ABNORMAL LOW (ref 8.9–10.3)
Chloride: 104 mmol/L (ref 98–111)
Creatinine, Ser: 1.54 mg/dL — ABNORMAL HIGH (ref 0.61–1.24)
GFR calc Af Amer: 51 mL/min — ABNORMAL LOW (ref >60)
GFR, EST NON AFRICAN AMERICAN: 44 mL/min — AB (ref >60)
GLUCOSE: 171 mg/dL — AB (ref 70–99)
Potassium: 4.4 mmol/L (ref 3.5–5.1)
Sodium: 134 mmol/L — ABNORMAL LOW (ref 135–145)

## 2018-06-20 LAB — URINALYSIS, ROUTINE W REFLEX MICROSCOPIC
Bilirubin Urine: NEGATIVE
Glucose, UA: NEGATIVE mg/dL
Hgb urine dipstick: NEGATIVE
Ketones, ur: NEGATIVE mg/dL
Leukocytes, UA: NEGATIVE
Nitrite: NEGATIVE
Protein, ur: NEGATIVE mg/dL
Specific Gravity, Urine: 1.014 (ref 1.005–1.030)
pH: 5 (ref 5.0–8.0)

## 2018-06-20 LAB — GLUCOSE, CAPILLARY
Glucose-Capillary: 128 mg/dL — ABNORMAL HIGH (ref 70–99)
Glucose-Capillary: 158 mg/dL — ABNORMAL HIGH (ref 70–99)
Glucose-Capillary: 167 mg/dL — ABNORMAL HIGH (ref 70–99)
Glucose-Capillary: 150 mg/dL — ABNORMAL HIGH (ref 70–99)

## 2018-06-20 NOTE — Progress Notes (Signed)
Orthopedic Tech Progress Note Patient Details:  [name] 1945/07/25 [id number]  Ortho Devices Type of Ortho Device: Postop shoe/boot Ortho Device/Splint Location: lle Ortho Device/Splint Interventions: Ordered, Application, Adjustment   Post Interventions Patient Tolerated: Well Instructions Provided: Care of device, Adjustment of device   Trinna Post 06/20/2018, 4:52 PM

## 2018-06-20 NOTE — Progress Notes (Signed)
Patient Demographics:    [name], is a 72 y.o. male, DOB - 1946/03/04, FMB:[id number]  Admit date - 06/17/2018   Admitting Physician Clydie Braun, MD  Outpatient Primary MD for the patient is [name]  LOS - 3   No chief complaint on file.       Subjective:    [name] today has no fevers, no emesis,  No chest pain, complains of chills,  Assessment  & Plan :    Principal Problem:   Osteomyelitis of great toe of left foot (HCC) Active Problems:   Chronic anticoagulation   Acute blood loss anemia   DM2 (diabetes mellitus, type 2) (HCC)   Osteomyelitis (HCC)   Cellulitis in diabetic foot (HCC)   Acute kidney injury superimposed on chronic kidney disease (HCC)   Diabetic ulcer of toe of left foot associated with diabetes mellitus due to underlying condition, with necrosis of bone [facility])  Brief Summary 72 y.o. male with medical history significant of DM type II, HTN, COPD, PAF on xarelto, chronic venous insufficiency , and previous amputation of the right great toe admitted on 06/17/2018 with infected left great toe diabetic ulcer and osteomyelitis of Lt halux   Plan:- 1)Osteomyelitis Left Great Toe, Diabetic Ulcer with Necrosis of Bone-----status post amputation of left great toe on 06/18/2018, ????  Surgical cure of osteomyelitis with amputation, continue Vanco and cefepime for now, ID consult appreciated, surgical cultures pending, white count is up to 25 k from 18.9, podiatrist request further inpatient iv antibiotic treatment given worsening leukocytosis pending cultures  2)AKI----acute kidney injury on CKD stage - III    creatinine on admission= 2.1  ,   baseline creatinine =1.5    , creatinine is now= 1.54   , renally adjust medications, avoid nephrotoxic agents/dehydration/hypotension   3) acute blood loss anemia----baseline hemoglobin around 12, admission  hemoglobin 9.7.Marland KitchenMarland Kitchen Hemoglobin is now 9.1 suspect some degree of hemodilution on postop blood loss... Monitor closely, patient complains of some orthostatic dizziness, however no baseline tachycardia,  transfuse if more symptomatic or if hemoglobin drops close to 7  4)PAFib--- continue amiodarone and Cardizem for rate control, atenolol on hold, Xarelto on hold  5)DM2-last A1c 6.4, reflecting excellent diabetic control, metformin on hold, continue sliding scale insulin coverage  Disposition/Need for in-Hospital Stay- patient unable to be discharged at this time due to worsening leukocytosis, continue IV Vanco and cefepime as recommended by infectious disease consultant pending culture results,  white count is up to 25 k from 18.9, podiatrist request further inpatient iv antibiotic treatment given worsening leukocytosis pending cultures   Code Status : full  Disposition Plan  : home ??? HH RN   Consults  :  Podiatry/ID  DVT Prophylaxis  :   Lab Results  Component Value Date   PLT 234 06/20/2018    Inpatient Medications  Scheduled Meds: . amiodarone  200 mg Oral Daily  . diltiazem  180 mg Oral Daily  . feeding supplement (ENSURE ENLIVE)  237 mL Oral BID BM  . folic acid  1 mg Oral Daily  . insulin aspart  0-9 Units Subcutaneous TID WC  . multivitamin with minerals  1 tablet Oral Daily  . rosuvastatin  5 mg Oral  q1800  . thiamine  100 mg Oral Daily   Or  . thiamine  100 mg Intravenous Daily  . umeclidinium-vilanterol  1 puff Inhalation Daily   Continuous Infusions: . sodium chloride 100 mL/hr at 06/20/18 0404  . ceFEPime (MAXIPIME) IV 2 g (06/20/18 0945)  . lactated ringers 10 mL/hr at 06/18/18 1325  . vancomycin 1,750 mg (06/19/18 2150)   PRN Meds:.acetaminophen **OR** acetaminophen, albuterol, LORazepam **OR** LORazepam, morphine injection, ondansetron **OR** ondansetron (ZOFRAN) IV, oxyCODONE    Anti-infectives (From admission, onward)   Start     Dose/Rate Route Frequency  Ordered Stop   06/18/18 2200  vancomycin (VANCOCIN) 1,750 mg in sodium chloride 0.9 % 500 mL IVPB     1,750 mg 250 mL/hr over 120 Minutes Intravenous Every 24 hours 06/18/18 0127     06/18/18 1413  vancomycin (VANCOCIN) powder  Status:  Discontinued       As needed 06/18/18 1413 06/18/18 1422   06/18/18 1000  ceFEPIme (MAXIPIME) 2 g in sodium chloride 0.9 % 100 mL IVPB     2 g 200 mL/hr over 30 Minutes Intravenous Every 12 hours 06/18/18 0127     06/18/18 0000  vancomycin (VANCOCIN) 2,000 mg in sodium chloride 0.9 % 500 mL IVPB     2,000 mg 250 mL/hr over 120 Minutes Intravenous  Once 06/17/18 2350 06/18/18 0326   06/18/18 0000  ceFEPIme (MAXIPIME) 2 g in sodium chloride 0.9 % 100 mL IVPB  Status:  Discontinued     2 g 200 mL/hr over 30 Minutes Intravenous Every 8 hours 06/17/18 2350 06/18/18 0127        Objective:   Vitals:   06/20/18 0100 06/20/18 0608 06/20/18 0921 06/20/18 1524  BP:  (!) 127/42  (!) 117/44  Pulse:  66  73  Resp:    18  Temp: 99.5 F (37.5 C) 98.1 F (36.7 C)  99 F (37.2 C)  TempSrc: Axillary Oral  Rectal  SpO2:  93% 95% 98%  Weight:      Height:        Wt Readings from Last 3 Encounters:  06/18/18 (!) 151 kg  04/01/18 (!) 151.5 kg  01/05/18 (!) 149.2 kg     Intake/Output Summary (Last 24 hours) at 06/20/2018 1555 Last data filed at 06/20/2018 0200 Gross per 24 hour  Intake -  Output 950 ml  Net -950 ml     Physical Exam Patient is examined daily including today on 06/20/18 , exams remain the same as of yesterday except that has changed   Gen:- Awake Alert,  obese HEENT:- [place].AT, No sclera icterus Neck-Supple Neck,No JVD,.  Lungs-  CTAB , fair symmetrical air movement CV- S1, S2 normal, regular  Abd-  +ve B.Sounds, Abd Soft, No tenderness,    Extremity/Skin:-  pedal pulses present  Psych-affect is appropriate, oriented x3 Neuro-no new focal deficits, no tremors MSK-status post prior right big toe amputation, some left leg swelling, left  foot postop area with dressing (status post Lt big toe amputation)   Data Review:   Micro Results Recent Results (from the past 240 hour(s))  Blood Cultures x 2 sites     Status: None (Preliminary result)   Collection Time: 06/17/18 11:35 PM  Result Value Ref Range Status   Specimen Description BLOOD LEFT FOREARM  Final   Special Requests   Final    BOTTLES DRAWN AEROBIC AND ANAEROBIC Blood Culture adequate volume   Culture   Final    NO GROWTH  2 DAYS Performed at [facility], [address], [place], [place] [address]    Report Status PENDING  Incomplete  Blood Cultures x 2 sites     Status: None (Preliminary result)   Collection Time: 06/17/18 11:47 PM  Result Value Ref Range Status   Specimen Description BLOOD RIGHT WRIST  Final   Special Requests   Final    BOTTLES DRAWN AEROBIC ONLY Blood Culture adequate volume   Culture   Final    NO GROWTH 2 DAYS Performed at [facility], [address], [place], [place] [address]    Report Status PENDING  Incomplete  Surgical pcr screen     Status: None   Collection Time: 06/18/18  7:09 AM  Result Value Ref Range Status   MRSA, PCR NEGATIVE NEGATIVE Final   Staphylococcus aureus NEGATIVE NEGATIVE Final    Comment: (NOTE) The Xpert SA Assay (FDA approved for NASAL specimens in patients 62 years of age and older), is one component of a comprehensive surveillance program. It is not intended to diagnose infection nor to guide or monitor treatment. Performed at [facility], [address], [place], [place] [address]   Culture, fungus without smear     Status: None (Preliminary result)   Collection Time: 06/18/18  1:53 PM  Result Value Ref Range Status   Specimen Description TOE LEFT  Final   Special Requests NONE  Final   Culture   Final    NO FUNGUS ISOLATED AFTER 2 DAYS Performed at [facility], [address], [place], [place] [address]    Report Status PENDING  Incomplete  Aerobic/Anaerobic Culture  (surgical/deep wound)     Status: None (Preliminary result)   Collection Time: 06/18/18  1:53 PM  Result Value Ref Range Status   Specimen Description TOE LEFT  Final   Special Requests NONE  Final   Gram Stain   Final    NO WBC SEEN NO ORGANISMS SEEN Performed at [facility], [address] [place], [place] [address]    Culture   Final    CULTURE REINCUBATED FOR BETTER GROWTH NO ANAEROBES ISOLATED; CULTURE IN PROGRESS FOR 5 DAYS    Report Status PENDING  Incomplete    Radiology Reports Dg Foot 2 Views Left  Result Date: 06/18/2018 CLINICAL DATA:  Post-operative state EXAM: LEFT FOOT - 2 VIEW COMPARISON:  None. FINDINGS: Status post amputation at the first proximal phalanx. Expected postsurgical changes within the surrounding soft tissues. Remainder of the osseous structures of the LEFT foot appear intact and normal in mineralization. IMPRESSION: Status post amputation at the first proximal phalanx. No evidence of surgical complicating feature. Electronically Signed   By: Bary Richard M.D.   On: 06/18/2018 15:02   Dg Foot Complete Left  Result Date: 06/17/2018 Please see detailed radiograph report in office note.    CBC Recent Labs  Lab 06/17/18 2330 06/18/18 0614 06/19/18 0534 06/20/18 0213  WBC 29.0* 24.5* 18.9* 25.0*  HGB 9.7* 8.9* 9.0* 9.1*  HCT 31.8* 30.2* 31.0* 30.7*  PLT 206 189 185 234  MCV 88.8 89.3 91.2 89.8  MCH 27.1 26.3 26.5 26.6  MCHC 30.5 29.5* 29.0* 29.6*  RDW 17.3* 17.4* 17.3* 17.2*  LYMPHSABS 2.9  --  2.3  --   MONOABS 2.3*  --  1.4*  --   EOSABS 0.6*  --  0.5  --   BASOSABS 0.0  --  0.1  --  Chemistries  Recent Labs  Lab 06/17/18 2330 06/18/18 0614 06/19/18 0534 06/20/18 0213  NA 137 140 140 134*  K 4.3 4.5 5.0 4.4  CL 104 107 109 104  CO2 20* 20* 23 20*  GLUCOSE 128* 124* 140* 171*  BUN 43* 45* 34* 27*  CREATININE 2.15* 2.06* 1.72* 1.54*  CALCIUM 9.0 8.6* 8.8* 8.7*    ------------------------------------------------------------------------------------------------------------------ Recent Labs    06/19/18 0534  CHOL 84  HDL 17*  LDLCALC 40  TRIG 829  CHOLHDL 4.9    Lab Results  Component Value Date   HGBA1C 6.4 (H) 06/17/2018   Coagulation profile Recent Labs  Lab 06/17/18 2330  INR 2.50   ------------------------------------------------------------------------------------------------------------------    Component Value Date/Time   BNP 80.9 10/04/2010 1255     Veora Fonte M.D on 06/20/2018 at 3:55 PM  Pager---[phone] Go to [email or web address] - password [email or web address] for contact info  [facility] - Office  [phone]

## 2018-06-20 NOTE — Progress Notes (Signed)
Subjective:  Patient ID: [name], male    DOB: 15-Jul-1945,  MRN: [id number]  Seen at bedside. States he didn't have a good night, reports nausea and vomiting and feeling febrile (though Tmax 99.5).   Objective:   Vitals:   06/20/18 0608 06/20/18 0921  BP: (!) 127/42   Pulse: 66   Resp:    Temp: 98.1 F (36.7 C)   SpO2: 93% 95%   General AA&O x3. Normal mood and affect.  Vascular Dorsalis pedis and posterior tibial pulses 2/4 bilat. Brisk capillary refill to all digits. Pedal hair present.  Neurologic Epicritic sensation grossly intact.  Dermatologic Surgical site healing well. Slight edema, no purulence, no active erythema along incision. No crepitus or fluctuance. Left leg edema, cellulitis continued  Orthopedic: MMT 5/5 in dorsiflexion, plantarflexion, inversion, and eversion. Normal joint ROM without pain or crepitus.   Results for orders placed or performed during the hospital encounter of 06/17/18 (from the past 24 hour(s))  Glucose, capillary     Status: Abnormal   Collection Time: 06/19/18 11:44 AM  Result Value Ref Range   Glucose-Capillary 142 (H) 70 - 99 mg/dL  Glucose, capillary     Status: Abnormal   Collection Time: 06/19/18  3:59 PM  Result Value Ref Range   Glucose-Capillary 134 (H) 70 - 99 mg/dL  Glucose, capillary     Status: Abnormal   Collection Time: 06/19/18  9:37 PM  Result Value Ref Range   Glucose-Capillary 119 (H) 70 - 99 mg/dL   Comment 1 Document in Chart   CBC     Status: Abnormal   Collection Time: 06/20/18  2:13 AM  Result Value Ref Range   WBC 25.0 (H) 4.0 - 10.5 K/uL   RBC 3.42 (L) 4.22 - 5.81 MIL/uL   Hemoglobin 9.1 (L) 13.0 - 17.0 g/dL   HCT 51.8 (L) 84.1 - 66.0 %   MCV 89.8 80.0 - 100.0 fL   MCH 26.6 26.0 - 34.0 pg   MCHC 29.6 (L) 30.0 - 36.0 g/dL   RDW 63.0 (H) 16.0 - 10.9 %   Platelets 234 150 - 400 K/uL   nRBC 0.1 0.0 - 0.2 %  Basic metabolic panel     Status: Abnormal   Collection Time: 06/20/18  2:13 AM  Result Value  Ref Range   Sodium 134 (L) 135 - 145 mmol/L   Potassium 4.4 3.5 - 5.1 mmol/L   Chloride 104 98 - 111 mmol/L   CO2 20 (L) 22 - 32 mmol/L   Glucose, Bld 171 (H) 70 - 99 mg/dL   BUN 27 (H) 8 - 23 mg/dL   Creatinine, Ser 3.23 (H) 0.61 - 1.24 mg/dL   Calcium 8.7 (L) 8.9 - 10.3 mg/dL   GFR calc non Af Amer 44 (L) >60 mL/min   GFR calc Af Amer 51 (L) >60 mL/min   Anion gap 10 5 - 15  Urinalysis, Routine w reflex microscopic     Status: None   Collection Time: 06/20/18  4:09 AM  Result Value Ref Range   Color, Urine YELLOW YELLOW   APPearance CLEAR CLEAR   Specific Gravity, Urine 1.014 1.005 - 1.030   pH 5.0 5.0 - 8.0   Glucose, UA NEGATIVE NEGATIVE mg/dL   Hgb urine dipstick NEGATIVE NEGATIVE   Bilirubin Urine NEGATIVE NEGATIVE   Ketones, ur NEGATIVE NEGATIVE mg/dL   Protein, ur NEGATIVE NEGATIVE mg/dL   Nitrite NEGATIVE NEGATIVE   Leukocytes, UA NEGATIVE NEGATIVE  Glucose, capillary  Status: Abnormal   Collection Time: 06/20/18  6:11 AM  Result Value Ref Range   Glucose-Capillary 128 (H) 70 - 99 mg/dL   Comment 1 Document in Chart    Results for orders placed or performed during the hospital encounter of 06/17/18  Blood Cultures x 2 sites     Status: None (Preliminary result)   Collection Time: 06/17/18 11:35 PM  Result Value Ref Range Status   Specimen Description BLOOD LEFT FOREARM  Final   Special Requests   Final    BOTTLES DRAWN AEROBIC AND ANAEROBIC Blood Culture adequate volume   Culture   Final    NO GROWTH 1 DAY Performed at [facility], [address], [place], [place] [address]    Report Status PENDING  Incomplete  Blood Cultures x 2 sites     Status: None (Preliminary result)   Collection Time: 06/17/18 11:47 PM  Result Value Ref Range Status   Specimen Description BLOOD RIGHT WRIST  Final   Special Requests   Final    BOTTLES DRAWN AEROBIC ONLY Blood Culture adequate volume   Culture   Final    NO GROWTH 1 DAY Performed at [facility],  [address], [place], [place] [address]    Report Status PENDING  Incomplete  Surgical pcr screen     Status: None   Collection Time: 06/18/18  7:09 AM  Result Value Ref Range Status   MRSA, PCR NEGATIVE NEGATIVE Final   Staphylococcus aureus NEGATIVE NEGATIVE Final    Comment: (NOTE) The Xpert SA Assay (FDA approved for NASAL specimens in patients 54 years of age and older), is one component of a comprehensive surveillance program. It is not intended to diagnose infection nor to guide or monitor treatment. Performed at [facility], [address], [place], [place] [address]   Aerobic/Anaerobic Culture (surgical/deep wound)     Status: None (Preliminary result)   Collection Time: 06/18/18  1:53 PM  Result Value Ref Range Status   Specimen Description TOE LEFT  Final   Special Requests NONE  Final   Gram Stain NO WBC SEEN NO ORGANISMS SEEN   Final   Culture   Final    TOO YOUNG TO READ Performed at [facility], [address] [place], [place] [address]    Report Status PENDING  Incomplete    Assessment & Plan:  Patient was evaluated and treated and all questions answered.  Left Great Toe Infection, s/p Partial Amputation -Surgical site well appearing. Sanguinous drainage no purulence. Slight edema. -Still with some leg cellulitis. -Cultures pending not finalized. -Path pending. -Trending WBC - 29<24.5<18.9>25. Would continue to trend. -Continue empiric abx. -Would recommend continued monitoring to ensure WBC lowers prior to discharge. Patient amenable to staying another night if approved by primary. -Will continue to follow.

## 2018-06-21 DIAGNOSIS — Z89412 Acquired absence of left great toe: Secondary | ICD-10-CM

## 2018-06-21 DIAGNOSIS — B954 Other streptococcus as the cause of diseases classified elsewhere: Secondary | ICD-10-CM

## 2018-06-21 DIAGNOSIS — E1169 Type 2 diabetes mellitus with other specified complication: Secondary | ICD-10-CM

## 2018-06-21 LAB — GLUCOSE, CAPILLARY
GLUCOSE-CAPILLARY: 162 mg/dL — AB (ref 70–99)
Glucose-Capillary: 172 mg/dL — ABNORMAL HIGH (ref 70–99)
Glucose-Capillary: 138 mg/dL — ABNORMAL HIGH (ref 70–99)

## 2018-06-21 LAB — BASIC METABOLIC PANEL
ANION GAP: 9 (ref 5–15)
BUN: 18 mg/dL (ref 8–23)
CO2: 20 mmol/L — ABNORMAL LOW (ref 22–32)
Calcium: 8.7 mg/dL — ABNORMAL LOW (ref 8.9–10.3)
Chloride: 107 mmol/L (ref 98–111)
Creatinine, Ser: 1.52 mg/dL — ABNORMAL HIGH (ref 0.61–1.24)
GFR calc Af Amer: 52 mL/min — ABNORMAL LOW (ref >60)
GFR calc non Af Amer: 45 mL/min — ABNORMAL LOW (ref >60)
Glucose, Bld: 165 mg/dL — ABNORMAL HIGH (ref 70–99)
Potassium: 4.3 mmol/L (ref 3.5–5.1)
Sodium: 136 mmol/L (ref 135–145)

## 2018-06-21 LAB — CBC
HEMATOCRIT: 27.7 % — AB (ref 39.0–52.0)
Hemoglobin: 8.3 g/dL — ABNORMAL LOW (ref 13.0–17.0)
MCH: 26.9 pg (ref 26.0–34.0)
MCHC: 30 g/dL (ref 30.0–36.0)
MCV: 89.6 fL (ref 80.0–100.0)
Platelets: 231 10*3/uL (ref 150–400)
RBC: 3.09 MIL/uL — ABNORMAL LOW (ref 4.22–5.81)
RDW: 17.1 % — ABNORMAL HIGH (ref 11.5–15.5)
WBC: 27.6 10*3/uL — ABNORMAL HIGH (ref 4.0–10.5)
nRBC: 0.3 % — ABNORMAL HIGH (ref 0.0–0.2)

## 2018-06-21 MED ORDER — BISACODYL 10 MG RE SUPP
10.0000 mg | Freq: Once | RECTAL | Status: DC
  Filled 2018-06-21: qty 1

## 2018-06-21 MED ORDER — LACTULOSE 10 GM/15ML PO SOLN
30.0000 g | Freq: Once | ORAL | Status: AC
  Administered 2018-06-21: 30 g via ORAL
  Filled 2018-06-21: qty 45

## 2018-06-21 MED ORDER — AMOXICILLIN-POT CLAVULANATE 875-125 MG PO TABS
1.0000 | ORAL_TABLET | Freq: Two times a day (BID) | ORAL | Status: DC
  Administered 2018-06-21 – 2018-06-22 (×2): 1 via ORAL
  Filled 2018-06-21 (×2): qty 1

## 2018-06-21 NOTE — Care Management Important Message (Signed)
Important Message  Patient Details  Name: [name] MRN: [id number] Date of Birth: 1945-07-21   Medicare Important Message Given:  Yes    Dorena Bodo 06/21/2018, 4:33 PM

## 2018-06-21 NOTE — Progress Notes (Signed)
[facility]  Date of Admission:  06/17/2018     Total days of antibiotics 5         ASSESSMENT/PLAN  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 Clinically doing well. Tolerating antibiotics without adverse side effects. Recheck CBC today. With source control there is no indication for prolonged antibiotics.   1. Discontinue Vancomycin and Cefepime. 2. Change antibiotic therapy to Augmentin.  3. Will monitor WBC count prior to discharge.    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 amiodarone  200 mg Oral Daily  . diltiazem  180 mg Oral Daily  . feeding supplement (ENSURE ENLIVE)  237 mL Oral BID BM  . folic acid  1 mg Oral Daily  . insulin aspart  0-9 Units Subcutaneous TID WC  . multivitamin with minerals  1 tablet Oral Daily  . rosuvastatin  5 mg Oral q1800  . thiamine  100 mg Oral Daily   Or  . thiamine  100 mg Intravenous Daily  . umeclidinium-vilanterol  1 puff Inhalation Daily    SUBJECTIVE:  Afebrile overnight with most recent WBC count of 25 on 12/7 with most recent CBC pending. No organisms seen on gram stain. Cultures with Group G streptococcus.   Pain is well controlled. Denies feelings of fevers. Does have chills at night which is normal for him.  Ready to go home.    No Known Allergies   Review of Systems: Review of Systems  Constitutional: Negative for chills and fever.  Respiratory: Negative for cough, sputum production, shortness of breath  and wheezing.   Cardiovascular: Negative for chest pain and leg swelling.  Gastrointestinal: Negative for abdominal pain, constipation, diarrhea, nausea and vomiting.  Skin: Negative for rash.      OBJECTIVE: Vitals:   06/20/18 1524 06/20/18 2113 06/21/18 0932 06/21/18 1000  BP: (!) 117/44 (!) 140/53  (!) 117/47  Pulse: 73 85 73   Resp: 18  20   Temp: 99 F (37.2 C) 98.3 F (36.8 C)    TempSrc: Rectal Axillary    SpO2: 98% 95% 95%   Weight:      Height:       Body mass index is 38.47 kg/m.  Physical Exam  Constitutional: He is oriented to person, place, and time. He appears well-developed and well-nourished. No distress.  Lying in bed with head of bed elevated.   Cardiovascular: Normal rate, regular rhythm, normal heart sounds and intact distal pulses. Exam reveals no gallop and no friction rub.  No murmur heard. Pulmonary/Chest: Effort normal and breath sounds normal. No stridor. No respiratory distress. He has no wheezes. He has no rales. He exhibits no tenderness.  Musculoskeletal:  Left foot surgical dressing is clean and dry.   Neurological: He is alert and oriented to person, place, and time.  Skin: Skin is warm and dry.  Psychiatric: He has a normal mood and affect.    Lab Results Lab Results  Component Value Date  WBC 25.0 (H) 06/20/2018   HGB 9.1 (L) 06/20/2018   HCT 30.7 (L) 06/20/2018   MCV 89.8 06/20/2018   PLT 234 06/20/2018    Lab Results  Component Value Date   CREATININE 1.52 (H) 06/21/2018   BUN 18 06/21/2018   NA 136 06/21/2018   K 4.3 06/21/2018   CL 107 06/21/2018   CO2 20 (L) 06/21/2018    Lab Results  Component Value Date   ALT 15 (L) 07/22/2017   AST 21 07/22/2017   ALKPHOS 71 07/22/2017   BILITOT 1.4 (H) 07/22/2017     Microbiology: Recent Results (from the past 240 hour(s))  Blood Cultures x 2 sites     Status: None (Preliminary result)   Collection Time: 06/17/18 11:35 PM  Result Value Ref Range Status   Specimen  Description BLOOD LEFT FOREARM  Final   Special Requests   Final    BOTTLES DRAWN AEROBIC AND ANAEROBIC Blood Culture adequate volume   Culture   Final    NO GROWTH 2 DAYS Performed at [facility], [address], [place], [place] [address]    Report Status PENDING  Incomplete  Blood Cultures x 2 sites     Status: None (Preliminary result)   Collection Time: 06/17/18 11:47 PM  Result Value Ref Range Status   Specimen Description BLOOD RIGHT WRIST  Final   Special Requests   Final    BOTTLES DRAWN AEROBIC ONLY Blood Culture adequate volume   Culture   Final    NO GROWTH 2 DAYS Performed at [facility], [address], [place], [place] [address]    Report Status PENDING  Incomplete  Surgical pcr screen     Status: None   Collection Time: 06/18/18  7:09 AM  Result Value Ref Range Status   MRSA, PCR NEGATIVE NEGATIVE Final   Staphylococcus aureus NEGATIVE NEGATIVE Final    Comment: (NOTE) The Xpert SA Assay (FDA approved for NASAL specimens in patients 69 years of age and older), is one component of a comprehensive surveillance program. It is not intended to diagnose infection nor to guide or monitor treatment. Performed at [facility], [address], [place], [place] [address]   Culture, fungus without smear     Status: None (Preliminary result)   Collection Time: 06/18/18  1:53 PM  Result Value Ref Range Status   Specimen Description TOE LEFT  Final   Special Requests NONE  Final   Culture   Final    NO FUNGUS ISOLATED AFTER 2 DAYS Performed at [facility], [address], [place], [place] [address]    Report Status PENDING  Incomplete  Aerobic/Anaerobic Culture (surgical/deep wound)     Status: None (Preliminary result)   Collection Time: 06/18/18  1:53 PM  Result Value Ref Range Status   Specimen Description TOE LEFT  Final   Special Requests NONE  Final   Gram Stain   Final    NO WBC SEEN NO ORGANISMS SEEN Performed at [facility], [address] [place], [place] [address]    Culture   Final    RARE STREPTOCOCCUS GROUP G NO ANAEROBES ISOLATED; CULTURE IN PROGRESS FOR 5 DAYS    Report Status PENDING  Incomplete     Marcos Eke, NP [facility] [phone] Pager  06/21/2018  10:37 AM

## 2018-06-21 NOTE — Progress Notes (Signed)
Subjective:  Patient ID: [name], male    DOB: 12-21-1945,  MRN: [id number]  Seen at bedside. Still feeling sick to his stomach but thinks it could be due to the meds. Endorses shortness of breath.  Objective:   Vitals:   06/21/18 1345 06/21/18 1346  BP: (!) 149/52   Pulse: 75 75  Resp: (!) 24   Temp:  97.9 F (36.6 C)  SpO2: 99% 98%   General AA&O x3. Normal mood and affect.  Vascular Dorsalis pedis and posterior tibial pulses 2/4 bilat. Brisk capillary refill to all digits. Pedal hair present.  Neurologic Epicritic sensation grossly intact.  Dermatologic Surgical site healing well. Slight edema, no purulence, no active erythema along incision. No crepitus or fluctuance. Left leg edema, cellulitis continued  Orthopedic: MMT 5/5 in dorsiflexion, plantarflexion, inversion, and eversion. Normal joint ROM without pain or crepitus.   Results for orders placed or performed during the hospital encounter of 06/17/18 (from the past 24 hour(s))  Glucose, capillary     Status: Abnormal   Collection Time: 06/20/18  9:15 PM  Result Value Ref Range   Glucose-Capillary 150 (H) 70 - 99 mg/dL   Comment 1 Document in Chart   Glucose, capillary     Status: Abnormal   Collection Time: 06/21/18  6:06 AM  Result Value Ref Range   Glucose-Capillary 172 (H) 70 - 99 mg/dL   Comment 1 Document in Chart   CBC     Status: Abnormal   Collection Time: 06/21/18  9:12 AM  Result Value Ref Range   WBC 27.6 (H) 4.0 - 10.5 K/uL   RBC 3.09 (L) 4.22 - 5.81 MIL/uL   Hemoglobin 8.3 (L) 13.0 - 17.0 g/dL   HCT 27.7 (L) 39.0 - 52.0 %   MCV 89.6 80.0 - 100.0 fL   MCH 26.9 26.0 - 34.0 pg   MCHC 30.0 30.0 - 36.0 g/dL   RDW 17.1 (H) 11.5 - 15.5 %   Platelets 231 150 - 400 K/uL   nRBC 0.3 (H) 0.0 - 0.2 %  Basic metabolic panel     Status: Abnormal   Collection Time: 06/21/18  9:12 AM  Result Value Ref Range   Sodium 136 135 - 145 mmol/L   Potassium 4.3 3.5 - 5.1 mmol/L   Chloride 107 98 - 111 mmol/L   CO2 20 (L) 22 - 32 mmol/L   Glucose, Bld 165 (H) 70 - 99 mg/dL   BUN 18 8 - 23 mg/dL   Creatinine, Ser 1.52 (H) 0.61 - 1.24 mg/dL   Calcium 8.7 (L) 8.9 - 10.3 mg/dL   GFR calc non Af Amer 45 (L) >60 mL/min   GFR calc Af Amer 52 (L) >60 mL/min   Anion gap 9 5 - 15  Glucose, capillary     Status: Abnormal   Collection Time: 06/21/18 11:30 AM  Result Value Ref Range   Glucose-Capillary 162 (H) 70 - 99 mg/dL  Glucose, capillary     Status: Abnormal   Collection Time: 06/21/18  3:47 PM  Result Value Ref Range   Glucose-Capillary 138 (H) 70 - 99 mg/dL   Results for orders placed or performed during the hospital encounter of 06/17/18  Blood Cultures x 2 sites     Status: None (Preliminary result)   Collection Time: 06/17/18 11:35 PM  Result Value Ref Range Status   Specimen Description BLOOD LEFT FOREARM  Final   Special Requests   Final    BOTTLES DRAWN AEROBIC  AND ANAEROBIC Blood Culture adequate volume   Culture   Final    NO GROWTH 3 DAYS Performed at [facility], [place] [address] [place], [place] [address]    Report Status PENDING  Incomplete  Blood Cultures x 2 sites     Status: None (Preliminary result)   Collection Time: 06/17/18 11:47 PM  Result Value Ref Range Status   Specimen Description BLOOD RIGHT WRIST  Final   Special Requests   Final    BOTTLES DRAWN AEROBIC ONLY Blood Culture adequate volume   Culture   Final    NO GROWTH 3 DAYS Performed at [facility], [address] [place], [place] [address]    Report Status PENDING  Incomplete  Surgical pcr screen     Status: None   Collection Time: 06/18/18  7:09 AM  Result Value Ref Range Status   MRSA, PCR NEGATIVE NEGATIVE Final   Staphylococcus aureus NEGATIVE NEGATIVE Final    Comment: (NOTE) The Xpert SA Assay (FDA approved for NASAL specimens in patients 77 years of age and older), is one component of a comprehensive surveillance program. It is not intended to diagnose infection nor to guide or  monitor treatment. Performed at [facility], [place] [address] [place], [place] [address]   Culture, fungus without smear     Status: None (Preliminary result)   Collection Time: 06/18/18  1:53 PM  Result Value Ref Range Status   Specimen Description TOE LEFT  Final   Special Requests NONE  Final   Culture   Final    NO FUNGUS ISOLATED AFTER 3 DAYS Performed at [facility], [address], [place], [place] [address]    Report Status PENDING  Incomplete  Aerobic/Anaerobic Culture (surgical/deep wound)     Status: None (Preliminary result)   Collection Time: 06/18/18  1:53 PM  Result Value Ref Range Status   Specimen Description TOE LEFT  Final   Special Requests NONE  Final   Gram Stain   Final    NO WBC SEEN NO ORGANISMS SEEN Performed at [facility], [address], [place], [place] [address]    Culture   Final    RARE STREPTOCOCCUS GROUP G NO ANAEROBES ISOLATED; CULTURE IN PROGRESS FOR 5 DAYS    Report Status PENDING  Incomplete    Assessment & Plan:  Patient was evaluated and treated and all questions answered.  Left Great Toe Infection, s/p Partial Amputation -Surgical site healing well. No purulence. Sanguinous drainage only. No signs of acute infection. -Cultures pending - Group G strep isolated. ID following. -Path confirmed OM with viable margin.. -WBC still elevated but surgical site looks ok. -Continue empiric abx. -From pedal standpoint surgical site well appearing despite elevated WBC. ID considered d/c home with following WBC outpatient. -Will check AM ESR/CRP to compare to admission.  L Leg Edema -Worsened since admission -On Xarelto at home, doesn't appear that he's on at present. Would consider restarting. -Order venous US to r/o DVT

## 2018-06-21 NOTE — Progress Notes (Signed)
Paged MD([email or web address]) for inquiry on VTE prophylaxis. Awaiting response

## 2018-06-21 NOTE — Plan of Care (Signed)

## 2018-06-21 NOTE — Progress Notes (Signed)
Patient Demographics:    [name], is a 72 y.o. male, DOB - 1946/03/27, QJJ:[id number]  Admit date - 06/17/2018   Admitting Physician Clydie Braun, MD  Outpatient Primary MD for the patient is [name]  LOS - 4   No chief complaint on file.       Subjective:    [name] today has no fevers, no emesis,  No chest pain, no chills, complains of constipation,   Assessment  & Plan :    Principal Problem:   Osteomyelitis of great toe of left foot (HCC) Active Problems:   Chronic anticoagulation   Acute blood loss anemia   DM2 (diabetes mellitus, type 2) (HCC)   Osteomyelitis (HCC)   Cellulitis in diabetic foot (HCC)   Acute kidney injury superimposed on chronic kidney disease (HCC)   Diabetic ulcer of toe of left foot associated with diabetes mellitus due to underlying condition, with necrosis of bone [facility])  Brief Summary 72 y.o. male with medical history significant of DM type II, HTN, COPD, PAF on xarelto, chronic venous insufficiency , and previous amputation of the right great toe admitted on 06/17/2018 with infected left great toe diabetic ulcer and osteomyelitis of Lt halux   Plan:- 1)Osteomyelitis Left Great Toe with Diabetic Ulcer with Necrosis of Bone-----status post amputation of left great toe on 06/18/2018, ????  Surgical cure of osteomyelitis with amputation, continue Vanco and cefepime for now, ID consult appreciated, surgical cultures pending, white count is up to 27 k from 18.9, podiatrist request further inpatient iv antibiotic treatment given worsening leukocytosis pending cultures, discussed with infectious disease they recommended switching to p.o. Augmentin monitoring patient for 24 hours if he does well possible discharge on 06/22/2018, IV Vanco and cefepime to be discontinued on 06/21/2018 as per ID recommendations.... Pathology report from left  great toe amputation noted  2)AKI----acute kidney injury on CKD stage - III ---   creatinine on admission= 2.1  ,   baseline creatinine =1.5    , creatinine is now= 1.54   , renally adjust medications, avoid nephrotoxic agents/dehydration/hypotension   3)Acute blood loss Anemia----baseline hemoglobin around 12, admission hemoglobin 9.7.Marland KitchenMarland Kitchen Hemoglobin is now 9.1 suspect some degree of hemodilution on postop blood loss... Monitor closely, patient complains of some orthostatic dizziness, however no baseline tachycardia,  transfuse if more symptomatic or if hemoglobin drops close to 7  4)PAFib--- continue amiodarone and Cardizem for rate control, atenolol on hold, Xarelto on hold  5)DM2-last A1c 6.4, reflecting excellent diabetic control, metformin on hold, continue sliding scale insulin coverage  6)Constipation--- give lactulose 30 mL p.o. x1, may use Dulcolax suppository x1  Disposition/Need for in-Hospital Stay- patient unable to be discharged at this time due to worsening leukocytosis, continue IV Vanco and cefepime as recommended by infectious disease consultant pending culture results,  white count is up to 27 k from 18.9,  antibiotic treatment given worsening leukocytosis pending cultures   Code Status : full  Disposition Plan  : home ??? HH RN   Consults  :  Podiatry/ID  DVT Prophylaxis  :   Lab Results  Component Value Date   PLT 231 06/21/2018    Inpatient Medications  Scheduled Meds: . amiodarone  200 mg Oral Daily  .  amoxicillin-clavulanate  1 tablet Oral Q12H  . bisacodyl  10 mg Rectal Once  . diltiazem  180 mg Oral Daily  . feeding supplement (ENSURE ENLIVE)  237 mL Oral BID BM  . folic acid  1 mg Oral Daily  . insulin aspart  0-9 Units Subcutaneous TID WC  . lactulose  30 g Oral Once  . multivitamin with minerals  1 tablet Oral Daily  . rosuvastatin  5 mg Oral q1800  . thiamine  100 mg Oral Daily   Or  . thiamine  100 mg Intravenous Daily  .  umeclidinium-vilanterol  1 puff Inhalation Daily   Continuous Infusions: . sodium chloride 100 mL/hr at 06/21/18 1611  . lactated ringers 10 mL/hr at 06/18/18 1325   PRN Meds:.acetaminophen **OR** acetaminophen, albuterol, morphine injection, ondansetron **OR** ondansetron (ZOFRAN) IV, oxyCODONE    Anti-infectives (From admission, onward)   Start     Dose/Rate Route Frequency Ordered Stop   06/21/18 2200  amoxicillin-clavulanate (AUGMENTIN) 875-125 MG per tablet 1 tablet     1 tablet Oral Every 12 hours 06/21/18 1239     06/18/18 2200  vancomycin (VANCOCIN) 1,750 mg in sodium chloride 0.9 % 500 mL IVPB  Status:  Discontinued     1,750 mg 250 mL/hr over 120 Minutes Intravenous Every 24 hours 06/18/18 0127 06/21/18 1100   06/18/18 1413  vancomycin (VANCOCIN) powder  Status:  Discontinued       As needed 06/18/18 1413 06/18/18 1422   06/18/18 1000  ceFEPIme (MAXIPIME) 2 g in sodium chloride 0.9 % 100 mL IVPB  Status:  Discontinued     2 g 200 mL/hr over 30 Minutes Intravenous Every 12 hours 06/18/18 0127 06/21/18 1232   06/18/18 0000  vancomycin (VANCOCIN) 2,000 mg in sodium chloride 0.9 % 500 mL IVPB     2,000 mg 250 mL/hr over 120 Minutes Intravenous  Once 06/17/18 2350 06/18/18 0326   06/18/18 0000  ceFEPIme (MAXIPIME) 2 g in sodium chloride 0.9 % 100 mL IVPB  Status:  Discontinued     2 g 200 mL/hr over 30 Minutes Intravenous Every 8 hours 06/17/18 2350 06/18/18 0127        Objective:   Vitals:   06/21/18 0932 06/21/18 1000 06/21/18 1345 06/21/18 1346  BP:  (!) 117/47 (!) 149/52   Pulse: 73  75 75  Resp: 20  (!) 24   Temp:    97.9 F (36.6 C)  TempSrc:    Oral  SpO2: 95%  99% 98%  Weight:      Height:        Wt Readings from Last 3 Encounters:  06/18/18 (!) 151 kg  04/01/18 (!) 151.5 kg  01/05/18 (!) 149.2 kg     Intake/Output Summary (Last 24 hours) at 06/21/2018 1633 Last data filed at 06/21/2018 1300 Gross per 24 hour  Intake 480 ml  Output 200 ml  Net 280  ml     Physical Exam Patient is examined daily including today on 06/21/18 , exams remain the same as of yesterday except that has changed   Gen:- Awake Alert,  obese HEENT:- [place].AT, No sclera icterus Neck-Supple Neck,No JVD,.  Lungs-  CTAB , fair symmetrical air movement CV- S1, S2 normal, regular  Abd-  +ve B.Sounds, Abd Soft, No tenderness, increased truncal adiposity Extremity/Skin:-  pedal pulses present  Psych-affect is appropriate, oriented x3 Neuro-no new focal deficits, no tremors MSK-status post prior right big toe amputation, some left leg swelling, left foot postop  area with dressing (status post Lt big toe amputation)   Data Review:   Micro Results Recent Results (from the past 240 hour(s))  Blood Cultures x 2 sites     Status: None (Preliminary result)   Collection Time: 06/17/18 11:35 PM  Result Value Ref Range Status   Specimen Description BLOOD LEFT FOREARM  Final   Special Requests   Final    BOTTLES DRAWN AEROBIC AND ANAEROBIC Blood Culture adequate volume   Culture   Final    NO GROWTH 3 DAYS Performed at [facility], [address] [place], [place] [address]    Report Status PENDING  Incomplete  Blood Cultures x 2 sites     Status: None (Preliminary result)   Collection Time: 06/17/18 11:47 PM  Result Value Ref Range Status   Specimen Description BLOOD RIGHT WRIST  Final   Special Requests   Final    BOTTLES DRAWN AEROBIC ONLY Blood Culture adequate volume   Culture   Final    NO GROWTH 3 DAYS Performed at [facility], [address], [place], [place] [address]    Report Status PENDING  Incomplete  Surgical pcr screen     Status: None   Collection Time: 06/18/18  7:09 AM  Result Value Ref Range Status   MRSA, PCR NEGATIVE NEGATIVE Final   Staphylococcus aureus NEGATIVE NEGATIVE Final    Comment: (NOTE) The Xpert SA Assay (FDA approved for NASAL specimens in patients 49 years of age and older), is one component of a  comprehensive surveillance program. It is not intended to diagnose infection nor to guide or monitor treatment. Performed at [facility], [address] [place], [place] [address]   Culture, fungus without smear     Status: None (Preliminary result)   Collection Time: 06/18/18  1:53 PM  Result Value Ref Range Status   Specimen Description TOE LEFT  Final   Special Requests NONE  Final   Culture   Final    NO FUNGUS ISOLATED AFTER 3 DAYS Performed at [facility], [address], [place], [place] [address]    Report Status PENDING  Incomplete  Aerobic/Anaerobic Culture (surgical/deep wound)     Status: None (Preliminary result)   Collection Time: 06/18/18  1:53 PM  Result Value Ref Range Status   Specimen Description TOE LEFT  Final   Special Requests NONE  Final   Gram Stain   Final    NO WBC SEEN NO ORGANISMS SEEN Performed at [facility], [address], [place], [place] [address]    Culture   Final    RARE STREPTOCOCCUS GROUP G NO ANAEROBES ISOLATED; CULTURE IN PROGRESS FOR 5 DAYS    Report Status PENDING  Incomplete    Radiology Reports Dg Foot 2 Views Left  Result Date: 06/18/2018 CLINICAL DATA:  Post-operative state EXAM: LEFT FOOT - 2 VIEW COMPARISON:  None. FINDINGS: Status post amputation at the first proximal phalanx. Expected postsurgical changes within the surrounding soft tissues. Remainder of the osseous structures of the LEFT foot appear intact and normal in mineralization. IMPRESSION: Status post amputation at the first proximal phalanx. No evidence of surgical complicating feature. Electronically Signed   By: Bary Richard M.D.   On: 06/18/2018 15:02   Dg Foot Complete Left  Result Date: 06/17/2018 Please see detailed radiograph report in office note.    CBC Recent Labs  Lab 06/17/18 2330 06/18/18 4656 06/19/18 0534 06/20/18 8127  06/21/18 0912  WBC 29.0* 24.5* 18.9* 25.0* 27.6*  HGB 9.7* 8.9* 9.0* 9.1* 8.3*  HCT 31.8* 30.2*  31.0* 30.7* 27.7*  PLT 206 189 185 234 231  MCV 88.8 89.3 91.2 89.8 89.6  MCH 27.1 26.3 26.5 26.6 26.9  MCHC 30.5 29.5* 29.0* 29.6* 30.0  RDW 17.3* 17.4* 17.3* 17.2* 17.1*  LYMPHSABS 2.9  --  2.3  --   --   MONOABS 2.3*  --  1.4*  --   --   EOSABS 0.6*  --  0.5  --   --   BASOSABS 0.0  --  0.1  --   --     Chemistries  Recent Labs  Lab 06/17/18 2330 06/18/18 0614 06/19/18 0534 06/20/18 0213 06/21/18 0912  NA 137 140 140 134* 136  K 4.3 4.5 5.0 4.4 4.3  CL 104 107 109 104 107  CO2 20* 20* 23 20* 20*  GLUCOSE 128* 124* 140* 171* 165*  BUN 43* 45* 34* 27* 18  CREATININE 2.15* 2.06* 1.72* 1.54* 1.52*  CALCIUM 9.0 8.6* 8.8* 8.7* 8.7*   ------------------------------------------------------------------------------------------------------------------ Recent Labs    06/19/18 0534  CHOL 84  HDL 17*  LDLCALC 40  TRIG 468  CHOLHDL 4.9    Lab Results  Component Value Date   HGBA1C 6.4 (H) 06/17/2018   Coagulation profile Recent Labs  Lab 06/17/18 2330  INR 2.50   ------------------------------------------------------------------------------------------------------------------    Component Value Date/Time   BNP 80.9 10/04/2010 1255     Stanford Strauch M.D on 06/21/2018 at 4:33 PM  Pager---[phone] Go to [email or web address] - [email or web address] for contact info  [facility] - Office  [phone]

## 2018-06-22 ENCOUNTER — Inpatient Hospital Stay (HOSPITAL_COMMUNITY): Payer: Medicare Other

## 2018-06-22 DIAGNOSIS — M7989 Other specified soft tissue disorders: Secondary | ICD-10-CM

## 2018-06-22 LAB — CBC
HCT: 28 % — ABNORMAL LOW (ref 39.0–52.0)
Hemoglobin: 8.1 g/dL — ABNORMAL LOW (ref 13.0–17.0)
MCH: 26 pg (ref 26.0–34.0)
MCHC: 28.9 g/dL — ABNORMAL LOW (ref 30.0–36.0)
MCV: 90 fL (ref 80.0–100.0)
Platelets: 221 10*3/uL (ref 150–400)
RBC: 3.11 MIL/uL — ABNORMAL LOW (ref 4.22–5.81)
RDW: 17.2 % — ABNORMAL HIGH (ref 11.5–15.5)
WBC: 28.7 10*3/uL — ABNORMAL HIGH (ref 4.0–10.5)
nRBC: 0.5 % — ABNORMAL HIGH (ref 0.0–0.2)

## 2018-06-22 LAB — GLUCOSE, CAPILLARY
GLUCOSE-CAPILLARY: 167 mg/dL — AB (ref 70–99)
Glucose-Capillary: 157 mg/dL — ABNORMAL HIGH (ref 70–99)
Glucose-Capillary: 139 mg/dL — ABNORMAL HIGH (ref 70–99)
Glucose-Capillary: 147 mg/dL — ABNORMAL HIGH (ref 70–99)
Glucose-Capillary: 155 mg/dL — ABNORMAL HIGH (ref 70–99)

## 2018-06-22 LAB — C-REACTIVE PROTEIN: CRP: 19.1 mg/dL — ABNORMAL HIGH (ref <1.0)

## 2018-06-22 LAB — SEDIMENTATION RATE: Sed Rate: 92 mm/hr — ABNORMAL HIGH (ref 0–16)

## 2018-06-22 MED ORDER — SODIUM CHLORIDE 0.9 % IV SOLN
3.0000 g | Freq: Four times a day (QID) | INTRAVENOUS | Status: DC
  Administered 2018-06-22 – 2018-06-23 (×4): 3 g via INTRAVENOUS
  Filled 2018-06-22 (×5): qty 3

## 2018-06-22 MED ORDER — IPRATROPIUM-ALBUTEROL 0.5-2.5 (3) MG/3ML IN SOLN
3.0000 mL | Freq: Three times a day (TID) | RESPIRATORY_TRACT | Status: DC
  Administered 2018-06-22 – 2018-06-24 (×6): 3 mL via RESPIRATORY_TRACT
  Filled 2018-06-22 (×6): qty 3

## 2018-06-22 MED ORDER — GUAIFENESIN ER 600 MG PO TB12
600.0000 mg | ORAL_TABLET | Freq: Two times a day (BID) | ORAL | Status: DC
  Administered 2018-06-22 – 2018-06-27 (×10): 600 mg via ORAL
  Filled 2018-06-22 (×10): qty 1

## 2018-06-22 MED ORDER — SODIUM CHLORIDE 0.9 % IV SOLN
500.0000 mg | INTRAVENOUS | Status: DC
  Administered 2018-06-22: 500 mg via INTRAVENOUS
  Filled 2018-06-22 (×2): qty 500

## 2018-06-22 MED ORDER — SODIUM CHLORIDE 0.9 % IV SOLN
2.0000 g | INTRAVENOUS | Status: DC
  Filled 2018-06-22: qty 20

## 2018-06-22 MED ORDER — POLYETHYLENE GLYCOL 3350 17 G PO PACK
17.0000 g | PACK | Freq: Every day | ORAL | Status: DC
  Administered 2018-06-23 – 2018-06-24 (×2): 17 g via ORAL
  Filled 2018-06-22 (×5): qty 1

## 2018-06-22 MED ORDER — ALBUTEROL SULFATE (2.5 MG/3ML) 0.083% IN NEBU: 2.5000 mg | INHALATION_SOLUTION | RESPIRATORY_TRACT | Status: DC | PRN

## 2018-06-22 MED ORDER — RIVAROXABAN 20 MG PO TABS
20.0000 mg | ORAL_TABLET | Freq: Every day | ORAL | Status: DC
  Administered 2018-06-22 – 2018-06-27 (×6): 20 mg via ORAL
  Filled 2018-06-22 (×7): qty 1

## 2018-06-22 NOTE — Progress Notes (Signed)
Left lower extremity venous duplex completed. Refer to "CV Proc" under chart review to view preliminary results.  06/22/2018 12:08 PM Gertie Fey, MHA, RVT, RDCS, RDMS

## 2018-06-22 NOTE — Progress Notes (Signed)
Pharmacy Antibiotic Note  [name] is a 72 y.o. male admitted on 06/17/2018 with osteo of L great toe now s/p amputation.  WBC elevated, PNA noted on CXR.  Pharmacy has been consulted for Unasyn dosing for aspiration PNA.  Plan: Unasyn 3gm IV q6h  Height: 6\' 6"  (198.1 cm) Weight: (!) 332 lb 14.3 oz (151 kg) IBW/kg (Calculated) : 91.4  Temp (24hrs), Avg:98.8 F (37.1 C), Min:97.4 F (36.3 C), Max:99.5 F (37.5 C)  Recent Labs  Lab 06/17/18 2330 06/18/18 0614 06/18/18 0851 06/19/18 0534 06/20/18 0213 06/21/18 0912 06/22/18 0316  WBC 29.0* 24.5*  --  18.9* 25.0* 27.6* 28.7*  CREATININE 2.15* 2.06*  --  1.72* 1.54* 1.52*  --   LATICACIDVEN  --  1.3 2.0* 1.1  --   --   --     Estimated Creatinine Clearance: 71.6 mL/min (A) (by C-G formula based on SCr of 1.52 mg/dL (H)).    No Known Allergies  Antimicrobials this admission: Cefepime 12/6 >> 12/9 Vanc 12/6 >> 12/8 Augmentin 12/9 >> 12/10 Azithro 12/10 >> Unasyn 12/10 >>  Dose adjustments this admission:   Microbiology results: 12/10 BCx >> 12/6 L toe >> rare group g strep 12/6 MRSA PCR negative 12/5 BCx >> ngtd  Thank you for allowing pharmacy to be a part of this patient's care.  14/5, Pharm.D., BCPS Clinical Pharmacist  **Pharmacist phone directory can now be found on [email or web address] ([email or web address]).  Listed under [facility].  06/22/2018 7:19 PM

## 2018-06-22 NOTE — Plan of Care (Signed)
  Problem: Pain Managment: Goal: General experience of comfort will improve Outcome: Progressing   Problem: Safety: Goal: Ability to remain free from injury will improve Outcome: Progressing   Problem: Skin Integrity: Goal: Risk for impaired skin integrity will decrease Outcome: Progressing   

## 2018-06-22 NOTE — Progress Notes (Signed)
Patient Demographics:    [name], is a 72 y.o. male, DOB - [birth year], GQQ:[id number]  Admit date - 06/17/2018   Admitting Physician Norval Morton, MD  Outpatient Primary MD for the patient is [place], [place] [facility]  LOS - 5   No chief complaint on file.       Subjective:    [name] today has no fevers, no emesis,  No chest pain, patient states he feels well, complains of productive cough, some shortness of breath,  Assessment  & Plan :    Principal Problem:   Osteomyelitis of great toe of left foot (HCC) Active Problems:   Chronic anticoagulation   Acute blood loss anemia   DM2 (diabetes mellitus, type 2) (HCC)   Osteomyelitis (HCC)   Cellulitis in diabetic foot ([place])   Acute kidney injury superimposed on chronic kidney disease (HCC)   Diabetic ulcer of toe of left foot associated with diabetes mellitus due to underlying condition, with necrosis of bone [facility])  Brief Summary 72 y.o. male with medical history significant of DM type II, HTN, COPD, PAF on xarelto, chronic venous insufficiency , and previous amputation of the right great toe admitted on 06/17/2018 with infected left great toe diabetic ulcer and osteomyelitis of Lt halux, now with left-sided pneumonia and worsening leukocytosis   Plan:- 1)Osteomyelitis Left Great Toe with Diabetic Ulcer with Necrosis of Bone-----status post amputation of left great toe on 06/18/2018, ????  Surgical cure of osteomyelitis with amputation, treated with Vanco and cefepime through 06/21/2018, surgical wound culture with group G strep, ID consult appreciated, surgical cultures pending, white count is up to 28.7 k.... ESR trending up to 92, CRP trending up to 19.1,  pathology report from left great toe amputation noted  2)Pneumonia------ suspect perioperative aspiration pneumonia,  ID consult appreciated, surgical cultures pending, white  count is up to 28.7 k.... ESR trending up to 92, CRP trending up to 19.1, Treat Empirically with IV Unasyn (06/22/18) to cover possible aspiration pneumonia, added azithromycin for possible atypical coverage, give bronchodilators and  mucolytics  3)AKI----acute kidney injury on CKD stage - III ---   creatinine on admission= 2.1  ,   baseline creatinine =1.5 , creatinine is now= 1.54   , renally adjust medications, avoid nephrotoxic agents/dehydration/hypotension   4)Acute blood loss Anemia----baseline hemoglobin around 12, admission hemoglobin 9.7.Marland KitchenMarland Kitchen Hemoglobin is down to 8.1 suspect some degree of hemodilution on postop blood loss... Monitor closely,   transfuse if more symptomatic or if hemoglobin drops close to 7  5)PAFib--- continue amiodarone and Cardizem for rate control, atenolol on hold, restarted Xarelto as no further surgical procedures applied  6)DM2-last A1c 6.4, reflecting excellent diabetic control, metformin on hold, continue sliding scale insulin coverage  7)Constipation--- improved, continue MiraLAX daily   Disposition/Need for in-Hospital Stay- patient unable to be discharged at this time due to worsening leukocytosis, treat with IV antibiotic as recommended by infectious disease consultant pending culture results,  , white count is up to 28.7 k.... ESR trending up to 92, CRP trending up to 19.1,  Code Status : full  Disposition Plan  : home ??? HH RN   Consults  :  Podiatry/ID  DVT Prophylaxis  :   Lab Results  Component  Value Date   PLT 221 06/22/2018    Inpatient Medications  Scheduled Meds: . amiodarone  200 mg Oral Daily  . bisacodyl  10 mg Rectal Once  . diltiazem  180 mg Oral Daily  . feeding supplement (ENSURE ENLIVE)  237 mL Oral BID BM  . folic acid  1 mg Oral Daily  . guaiFENesin  600 mg Oral BID  . insulin aspart  0-9 Units Subcutaneous TID WC  . ipratropium-albuterol  3 mL Nebulization TID  . multivitamin with minerals  1 tablet Oral Daily  .  rivaroxaban  20 mg Oral Daily  . rosuvastatin  5 mg Oral q1800  . thiamine  100 mg Oral Daily   Or  . thiamine  100 mg Intravenous Daily  . umeclidinium-vilanterol  1 puff Inhalation Daily   Continuous Infusions: . azithromycin    . lactated ringers 10 mL/hr at 06/18/18 1325   PRN Meds:.acetaminophen **OR** acetaminophen, albuterol, morphine injection, ondansetron **OR** ondansetron (ZOFRAN) IV, oxyCODONE    Anti-infectives (From admission, onward)   Start     Dose/Rate Route Frequency Ordered Stop   06/22/18 1830  cefTRIAXone (ROCEPHIN) 2 g in sodium chloride 0.9 % 100 mL IVPB  Status:  Discontinued     2 g 200 mL/hr over 30 Minutes Intravenous Every 24 hours 06/22/18 1811 06/22/18 1817   06/22/18 1830  azithromycin (ZITHROMAX) 500 mg in sodium chloride 0.9 % 250 mL IVPB     500 mg 250 mL/hr over 60 Minutes Intravenous Every 24 hours 06/22/18 1811     06/21/18 2200  amoxicillin-clavulanate (AUGMENTIN) 875-125 MG per tablet 1 tablet  Status:  Discontinued     1 tablet Oral Every 12 hours 06/21/18 1239 06/22/18 1811   06/18/18 2200  vancomycin (VANCOCIN) 1,750 mg in sodium chloride 0.9 % 500 mL IVPB  Status:  Discontinued     1,750 mg 250 mL/hr over 120 Minutes Intravenous Every 24 hours 06/18/18 0127 06/21/18 1100   06/18/18 1413  vancomycin (VANCOCIN) powder  Status:  Discontinued       As needed 06/18/18 1413 06/18/18 1422   06/18/18 1000  ceFEPIme (MAXIPIME) 2 g in sodium chloride 0.9 % 100 mL IVPB  Status:  Discontinued     2 g 200 mL/hr over 30 Minutes Intravenous Every 12 hours 06/18/18 0127 06/21/18 1232   06/18/18 0000  vancomycin (VANCOCIN) 2,000 mg in sodium chloride 0.9 % 500 mL IVPB     2,000 mg 250 mL/hr over 120 Minutes Intravenous  Once 06/17/18 2350 06/18/18 0326   06/18/18 0000  ceFEPIme (MAXIPIME) 2 g in sodium chloride 0.9 % 100 mL IVPB  Status:  Discontinued     2 g 200 mL/hr over 30 Minutes Intravenous Every 8 hours 06/17/18 2350 06/18/18 0127         Objective:   Vitals:   06/22/18 0318 06/22/18 0923 06/22/18 1040 06/22/18 1700  BP: (!) 129/51  (!) 125/53   Pulse: 83 86 83   Resp:  18  19  Temp: 99.5 F (37.5 C)   (!) 97.4 F (36.3 C)  TempSrc: Oral   Axillary  SpO2: 94% 97%    Weight:      Height:        Wt Readings from Last 3 Encounters:  06/18/18 (!) 151 kg  04/01/18 (!) 151.5 kg  01/05/18 (!) 149.2 kg     Intake/Output Summary (Last 24 hours) at 06/22/2018 1833 Last data filed at 06/22/2018 1700 Gross per 24  hour  Intake 240 ml  Output 2450 ml  Net -2210 ml    Physical Exam Patient is examined daily including today on 06/22/18 , exams remain the same as of yesterday except that has changed   Gen:- Awake Alert,  obese HEENT:- [place].AT, No sclera icterus Neck-Supple Neck,No JVD,.  Lungs-diminished in bases, scattered rhonchi especially on the left, no wheezing CV- S1, S2 normal, regular  Abd-  +ve B.Sounds, Abd Soft, No tenderness, increased truncal adiposity Extremity/Skin:-  pedal pulses present  Psych-affect is appropriate, oriented x3 Neuro-no new focal deficits, no tremors MSK-status post prior right big toe amputation, some left leg swelling, left foot postop area with dressing (status post Lt big toe amputation)   Data Review:   Micro Results Recent Results (from the past 240 hour(s))  Blood Cultures x 2 sites     Status: None (Preliminary result)   Collection Time: 06/17/18 11:35 PM  Result Value Ref Range Status   Specimen Description BLOOD LEFT FOREARM  Final   Special Requests   Final    BOTTLES DRAWN AEROBIC AND ANAEROBIC Blood Culture adequate volume   Culture   Final    NO GROWTH 4 DAYS Performed at [facility], [place] [address], [place], [place] [address]    Report Status PENDING  Incomplete  Blood Cultures x 2 sites     Status: None (Preliminary result)   Collection Time: 06/17/18 11:47 PM  Result Value Ref Range Status   Specimen Description BLOOD RIGHT WRIST  Final   Special  Requests   Final    BOTTLES DRAWN AEROBIC ONLY Blood Culture adequate volume   Culture   Final    NO GROWTH 4 DAYS Performed at [facility], [address] [place], [place] [address]    Report Status PENDING  Incomplete  Surgical pcr screen     Status: None   Collection Time: 06/18/18  7:09 AM  Result Value Ref Range Status   MRSA, PCR NEGATIVE NEGATIVE Final   Staphylococcus aureus NEGATIVE NEGATIVE Final    Comment: (NOTE) The Xpert SA Assay (FDA approved for NASAL specimens in patients 40 years of age and older), is one component of a comprehensive surveillance program. It is not intended to diagnose infection nor to guide or monitor treatment. Performed at [facility], [place] [address], [place], [place] [address]   Culture, fungus without smear     Status: None (Preliminary result)   Collection Time: 06/18/18  1:53 PM  Result Value Ref Range Status   Specimen Description TOE LEFT  Final   Special Requests NONE  Final   Culture   Final    NO FUNGUS ISOLATED AFTER 3 DAYS Performed at [facility], [address], [place], [place] [address]    Report Status PENDING  Incomplete  Aerobic/Anaerobic Culture (surgical/deep wound)     Status: None (Preliminary result)   Collection Time: 06/18/18  1:53 PM  Result Value Ref Range Status   Specimen Description TOE LEFT  Final   Special Requests NONE  Final   Gram Stain   Final    NO WBC SEEN NO ORGANISMS SEEN Performed at [facility], [address], [place], [place] [address]    Culture   Final    RARE STREPTOCOCCUS GROUP G NO ANAEROBES ISOLATED; CULTURE IN PROGRESS FOR 5 DAYS    Report Status PENDING  Incomplete    Radiology Reports Dg Chest 2 View  Result Date: 06/22/2018 CLINICAL DATA:  Shortness of breath, left-sided chest pain, congestion for for 5 days, recent partial amputation of the toe EXAM: CHEST - 2 VIEW COMPARISON:  Chest x-ray of 10/06/2016 FINDINGS: There is parenchymal opacity  apparently medially at the left lung base most consistent with left lower lobe pneumonia and probable small left effusion. There is also cardiomegaly present and very minimal pulmonary vascular congestion superimposed cannot be excluded. Mediastinal and hilar contours are unremarkable. No acute bony abnormality is seen. IMPRESSION: 1. Pneumonia which appears to be within the left lower lobe posteriorly with small left pleural effusion. 2. Cardiomegaly. Cannot exclude very mild pulmonary vascular congestion. Electronically Signed   By: Ivar Drape M.D.   On: 06/22/2018 13:32   Dg Foot 2 Views Left  Result Date: 06/18/2018 CLINICAL DATA:  Post-operative state EXAM: LEFT FOOT - 2 VIEW COMPARISON:  None. FINDINGS: Status post amputation at the first proximal phalanx. Expected postsurgical changes within the surrounding soft tissues. Remainder of the osseous structures of the LEFT foot appear intact and normal in mineralization. IMPRESSION: Status post amputation at the first proximal phalanx. No evidence of surgical complicating feature. Electronically Signed   By: Franki Cabot M.D.   On: 06/18/2018 15:02   Dg Foot Complete Left  Result Date: 06/17/2018 Please see detailed radiograph report in office note.  Vas [place] Lower Extremity Venous (dvt)  Result Date: 06/22/2018  Lower Venous Study Indications: Swelling.  Limitations: Body habitus, poor ultrasound/tissue interface and bandages. Performing Technologist: Maudry Mayhew MHA, RDMS, RVT, RDCS  Examination Guidelines: A complete evaluation includes B-mode imaging, spectral Doppler, color Doppler, and power Doppler as needed of all accessible portions of each vessel. Bilateral testing is considered an integral part of a complete examination. Limited examinations for reoccurring indications may be performed as noted.  Right Venous Findings: +---+---------------+---------+-----------+----------+-------+     CompressibilityPhasicitySpontaneityPropertiesSummary +---+---------------+---------+-----------+----------+-------+ CFVFull           Yes      Yes                          +---+---------------+---------+-----------+----------+-------+  Left Venous Findings: +---------+---------------+---------+-----------+----------+--------------+          CompressibilityPhasicitySpontaneityPropertiesSummary        +---------+---------------+---------+-----------+----------+--------------+ CFV      Full           Yes      Yes                                 +---------+---------------+---------+-----------+----------+--------------+ SFJ      Full                                                        +---------+---------------+---------+-----------+----------+--------------+ FV Prox  Full                                                        +---------+---------------+---------+-----------+----------+--------------+ FV Mid   Full                                                        +---------+---------------+---------+-----------+----------+--------------+  FV Distal                                             Not visualized +---------+---------------+---------+-----------+----------+--------------+ PFV      Full                                                        +---------+---------------+---------+-----------+----------+--------------+ POP      Full           Yes      Yes                                 +---------+---------------+---------+-----------+----------+--------------+ PTV                                                   Not visualized +---------+---------------+---------+-----------+----------+--------------+ PERO                                                  Not visualized +---------+---------------+---------+-----------+----------+--------------+    Summary: Right: No evidence of common femoral vein obstruction. Left: There is  no evidence of deep vein thrombosis in the lower extremity. However, portions of this examination were limited- see technologist comments above. No cystic structure found in the popliteal fossa. Ultrasound characteristics of enlarged lymph  nodes noted in the groin.  *See table(s) above for measurements and observations. Electronically signed by Deitra Mayo MD on 06/22/2018 at 1:59:07 PM.    Final      CBC Recent Labs  Lab 06/17/18 2330 06/18/18 2633 06/19/18 0534 06/20/18 0213 06/21/18 0912 06/22/18 0316  WBC 29.0* 24.5* 18.9* 25.0* 27.6* 28.7*  HGB 9.7* 8.9* 9.0* 9.1* 8.3* 8.1*  HCT 31.8* 30.2* 31.0* 30.7* 27.7* 28.0*  PLT 206 189 185 234 231 221  MCV 88.8 89.3 91.2 89.8 89.6 90.0  MCH 27.1 26.3 26.5 26.6 26.9 26.0  MCHC 30.5 29.5* 29.0* 29.6* 30.0 28.9*  RDW 17.3* 17.4* 17.3* 17.2* 17.1* 17.2*  LYMPHSABS 2.9  --  2.3  --   --   --   MONOABS 2.3*  --  1.4*  --   --   --   EOSABS 0.6*  --  0.5  --   --   --   BASOSABS 0.0  --  0.1  --   --   --     Chemistries  Recent Labs  Lab 06/17/18 2330 06/18/18 0614 06/19/18 0534 06/20/18 0213 06/21/18 0912  NA 137 140 140 134* 136  K 4.3 4.5 5.0 4.4 4.3  CL 104 107 109 104 107  CO2 20* 20* 23 20* 20*  GLUCOSE 128* 124* 140* 171* 165*  BUN 43* 45* 34* 27* 18  CREATININE 2.15* 2.06* 1.72* 1.54* 1.52*  CALCIUM 9.0 8.6* 8.8* 8.7* 8.7*   ------------------------------------------------------------------------------------------------------------------ No results for input(s): CHOL, HDL, LDLCALC, TRIG, CHOLHDL, LDLDIRECT in the last 72 hours.  Lab  Results  Component Value Date   HGBA1C 6.4 (H) 06/17/2018   Coagulation profile Recent Labs  Lab 06/17/18 2330  INR 2.50   ------------------------------------------------------------------------------------------------------------------    Component Value Date/Time   BNP 80.9 10/04/2010 1255     Davaun Quintela M.D on 06/22/2018 at 6:33 PM  Pager---[phone] Go  to [email or web address] - password [email or web address] for contact info  [facility] - Office  [phone]

## 2018-06-23 DIAGNOSIS — D62 Acute posthemorrhagic anemia: Secondary | ICD-10-CM

## 2018-06-23 DIAGNOSIS — L03119 Cellulitis of unspecified part of limb: Secondary | ICD-10-CM

## 2018-06-23 DIAGNOSIS — E11621 Type 2 diabetes mellitus with foot ulcer: Principal | ICD-10-CM

## 2018-06-23 DIAGNOSIS — Z7901 Long term (current) use of anticoagulants: Secondary | ICD-10-CM

## 2018-06-23 DIAGNOSIS — L97509 Non-pressure chronic ulcer of other part of unspecified foot with unspecified severity: Secondary | ICD-10-CM

## 2018-06-23 DIAGNOSIS — R0602 Shortness of breath: Secondary | ICD-10-CM

## 2018-06-23 DIAGNOSIS — E11628 Type 2 diabetes mellitus with other skin complications: Secondary | ICD-10-CM

## 2018-06-23 LAB — CBC
HCT: 26.8 % — ABNORMAL LOW (ref 39.0–52.0)
Hemoglobin: 8.1 g/dL — ABNORMAL LOW (ref 13.0–17.0)
MCH: 27 pg (ref 26.0–34.0)
MCHC: 30.2 g/dL (ref 30.0–36.0)
MCV: 89.3 fL (ref 80.0–100.0)
Platelets: 208 10*3/uL (ref 150–400)
RBC: 3 MIL/uL — ABNORMAL LOW (ref 4.22–5.81)
RDW: 17.2 % — ABNORMAL HIGH (ref 11.5–15.5)
WBC: 26.5 10*3/uL — ABNORMAL HIGH (ref 4.0–10.5)
nRBC: 0.6 % — ABNORMAL HIGH (ref 0.0–0.2)

## 2018-06-23 LAB — BASIC METABOLIC PANEL
Anion gap: 8 (ref 5–15)
BUN: 12 mg/dL (ref 8–23)
CO2: 21 mmol/L — ABNORMAL LOW (ref 22–32)
Calcium: 8.4 mg/dL — ABNORMAL LOW (ref 8.9–10.3)
Chloride: 109 mmol/L (ref 98–111)
Creatinine, Ser: 1.37 mg/dL — ABNORMAL HIGH (ref 0.61–1.24)
GFR calc Af Amer: 59 mL/min — ABNORMAL LOW (ref >60)
GFR calc non Af Amer: 51 mL/min — ABNORMAL LOW (ref >60)
Glucose, Bld: 158 mg/dL — ABNORMAL HIGH (ref 70–99)
Potassium: 4.3 mmol/L (ref 3.5–5.1)
Sodium: 138 mmol/L (ref 135–145)

## 2018-06-23 LAB — GLUCOSE, CAPILLARY
Glucose-Capillary: 136 mg/dL — ABNORMAL HIGH (ref 70–99)
Glucose-Capillary: 148 mg/dL — ABNORMAL HIGH (ref 70–99)
Glucose-Capillary: 163 mg/dL — ABNORMAL HIGH (ref 70–99)
Glucose-Capillary: 159 mg/dL — ABNORMAL HIGH (ref 70–99)

## 2018-06-23 LAB — CULTURE, BLOOD (ROUTINE X 2)
Culture: NO GROWTH
Culture: NO GROWTH
Special Requests: ADEQUATE
Special Requests: ADEQUATE

## 2018-06-23 LAB — AEROBIC/ANAEROBIC CULTURE W GRAM STAIN (SURGICAL/DEEP WOUND): Gram Stain: NONE SEEN

## 2018-06-23 MED ORDER — AZITHROMYCIN 250 MG PO TABS
250.0000 mg | ORAL_TABLET | Freq: Every day | ORAL | Status: AC
  Administered 2018-06-23 – 2018-06-26 (×4): 250 mg via ORAL
  Filled 2018-06-23 (×4): qty 1

## 2018-06-23 MED ORDER — AMOXICILLIN-POT CLAVULANATE 875-125 MG PO TABS
1.0000 | ORAL_TABLET | Freq: Two times a day (BID) | ORAL | Status: DC
  Administered 2018-06-23 – 2018-06-27 (×8): 1 via ORAL
  Filled 2018-06-23 (×8): qty 1

## 2018-06-23 NOTE — Plan of Care (Signed)
  Problem: Education: Goal: Knowledge of General Education information will improve Description Including pain rating scale, medication(s)/side effects and non-pharmacologic comfort measures Outcome: Progressing   

## 2018-06-23 NOTE — Plan of Care (Signed)
  Problem: Pain Managment: Goal: General experience of comfort will improve Outcome: Progressing   Problem: Safety: Goal: Ability to remain free from injury will improve Outcome: Progressing   

## 2018-06-23 NOTE — Progress Notes (Signed)
Patient has home CPAP at bedside. Patient places self on/off needing no assistance.

## 2018-06-23 NOTE — Progress Notes (Signed)
PROGRESS NOTE  [name] PQA:[id number] DOB: 1945/10/29 DOA: 06/17/2018 PCP: [name]  HPI/Brief Narrative  [name] is a 72 y.o. year old male with medical history significant for DM type II, HTN, COPD,PAFon xarelto, chronic venous insufficiency , and previous amputation of the right great toe who presented on 06/17/2018 with worsening pain and redness of left great toe and was found to have left toe osteomyelitis.  Course complicated by recent diagnosis of aspiration pneumonitis with left lower lobe pneumonia.  Subjective Reports improvement in his breathing. Still coughing but states it is better than yesterday  Assessment/Plan:  #Left lower lobe pneumonia, improving.  Not requiring any oxygen.  Given clinical improvement will de-escalate from IV Unasyn and IV azithromycin to oral Augmentin and oral azithromycin.  Anticipate discharge in next 24 hours if continues to have good clinical improvement on oral regimen.  Supportive care with incentive spirometry and flutter valve, Mucinex  #Diabetic L foot ulcer/osteomyelitis of great toe.  Status post surgical cure via podiatry intervention.  Given persistent leukocytosis during hospital course ID recommended further therapy with Augmentin for total 7 days.  We will continue such.  Leukocytosis, improving.  Peak WBC of 20.7.  Now down trended to 26.5.  Patient has remained afebrile.  Treated as above  #AKI on CKD stage III. Creatinine 1.5, back at baseline.  Avoid nephrotoxins.  #Acute blood loss anemia, stable.  Related to recent surgery during hospitalization.  Hemoglobin on presentation 9.7 remained stable on repeat monitoring at 8, no gross signs or symptoms of bleeding.  #Asthma atrial fibrillation, rate controlled.  Continue home amiodarone and Cardizem.  Tolerating home Xarelto.  #Type 2 diabetes.  A1c 6.4.  Continue sliding scale coverage.  Code Status: Full code  Family Communication: No  family at bedside  Disposition Plan: Anticipate discharge next 24 hours if remains clinically stable on oral antibiotic regimen.,  Will repeat CBC to ensure continue improvement   Consultants:  Podiatry, infectious disease      Antimicrobials: Anti-infectives (From admission, onward)   Start     Dose/Rate Route Frequency Ordered Stop   06/23/18 2200  amoxicillin-clavulanate (AUGMENTIN) 875-125 MG per tablet 1 tablet     1 tablet Oral Every 12 hours 06/23/18 1546 06/28/18 2159   06/23/18 1600  azithromycin (ZITHROMAX) tablet 250 mg     250 mg Oral Daily 06/23/18 1546 06/27/18 0959   06/22/18 2000  Ampicillin-Sulbactam (UNASYN) 3 g in sodium chloride 0.9 % 100 mL IVPB  Status:  Discontinued     3 g 200 mL/hr over 30 Minutes Intravenous Every 6 hours 06/22/18 1919 06/23/18 1544   06/22/18 1830  cefTRIAXone (ROCEPHIN) 2 g in sodium chloride 0.9 % 100 mL IVPB  Status:  Discontinued     2 g 200 mL/hr over 30 Minutes Intravenous Every 24 hours 06/22/18 1811 06/22/18 1817   06/22/18 1830  azithromycin (ZITHROMAX) 500 mg in sodium chloride 0.9 % 250 mL IVPB  Status:  Discontinued     500 mg 250 mL/hr over 60 Minutes Intravenous Every 24 hours 06/22/18 1811 06/23/18 1544   06/21/18 2200  amoxicillin-clavulanate (AUGMENTIN) 875-125 MG per tablet 1 tablet  Status:  Discontinued     1 tablet Oral Every 12 hours 06/21/18 1239 06/22/18 1811   06/18/18 2200  vancomycin (VANCOCIN) 1,750 mg in sodium chloride 0.9 % 500 mL IVPB  Status:  Discontinued     1,750 mg 250 mL/hr over 120 Minutes Intravenous Every 24  hours 06/18/18 0127 06/21/18 1100   06/18/18 1413  vancomycin (VANCOCIN) powder  Status:  Discontinued       As needed 06/18/18 1413 06/18/18 1422   06/18/18 1000  ceFEPIme (MAXIPIME) 2 g in sodium chloride 0.9 % 100 mL IVPB  Status:  Discontinued     2 g 200 mL/hr over 30 Minutes Intravenous Every 12 hours 06/18/18 0127 06/21/18 1232   06/18/18 0000  vancomycin (VANCOCIN) 2,000 mg in sodium  chloride 0.9 % 500 mL IVPB     2,000 mg 250 mL/hr over 120 Minutes Intravenous  Once 06/17/18 2350 06/18/18 0326   06/18/18 0000  ceFEPIme (MAXIPIME) 2 g in sodium chloride 0.9 % 100 mL IVPB  Status:  Discontinued     2 g 200 mL/hr over 30 Minutes Intravenous Every 8 hours 06/17/18 2350 06/18/18 0127          DVT prophylaxis: Xarelto   Objective: Vitals:   06/23/18 0853 06/23/18 0856 06/23/18 1401 06/23/18 1937  BP:      Pulse: 83  76   Resp: 20  18   Temp:      TempSrc:      SpO2: 96% 96% 95% 92%  Weight:      Height:        Intake/Output Summary (Last 24 hours) at 06/23/2018 1945 Last data filed at 06/23/2018 1800 Gross per 24 hour  Intake 720 ml  Output 1100 ml  Net -380 ml   Filed Weights   06/17/18 2018 06/18/18 1326  Weight: (!) 151 kg (!) 151 kg    Exam:  Constitutional: Obese male, no distress Eyes: EOMI, anicteric, normal conjunctivae ENMT: Oropharynx with moist mucous membranes, normal dentition Cardiovascular: RRR no MRGs, with no peripheral edema Respiratory: Normal respiratory effort, crackles at bases otherwise no wheezing or rhonchi Abdomen: Soft,non-tender,  Skin: Dressing in place on left foot Neurologic: Grossly no focal neuro deficit. Psychiatric:Appropriate affect, and mood. Mental status AAOx3  Data Reviewed: CBC: Recent Labs  Lab 06/17/18 2330  06/19/18 0534 06/20/18 0213 06/21/18 0912 06/22/18 0316 06/23/18 0233  WBC 29.0*   < > 18.9* 25.0* 27.6* 28.7* 26.5*  NEUTROABS 23.2*  --  14.2*  --   --   --   --   HGB 9.7*   < > 9.0* 9.1* 8.3* 8.1* 8.1*  HCT 31.8*   < > 31.0* 30.7* 27.7* 28.0* 26.8*  MCV 88.8   < > 91.2 89.8 89.6 90.0 89.3  PLT 206   < > 185 234 231 221 208   < > = values in this interval not displayed.   Basic Metabolic Panel: Recent Labs  Lab 06/18/18 0614 06/19/18 0534 06/20/18 0213 06/21/18 0912 06/23/18 0233  NA 140 140 134* 136 138  K 4.5 5.0 4.4 4.3 4.3  CL 107 109 104 107 109  CO2 20* 23 20* 20*  21*  GLUCOSE 124* 140* 171* 165* 158*  BUN 45* 34* 27* 18 12  CREATININE 2.06* 1.72* 1.54* 1.52* 1.37*  CALCIUM 8.6* 8.8* 8.7* 8.7* 8.4*   GFR: Estimated Creatinine Clearance: 79.4 mL/min (A) (by C-G formula based on SCr of 1.37 mg/dL (H)). Liver Function Tests: No results for input(s): AST, ALT, ALKPHOS, BILITOT, PROT, ALBUMIN in the last 168 hours. No results for input(s): LIPASE, AMYLASE in the last 168 hours. No results for input(s): AMMONIA in the last 168 hours. Coagulation Profile: Recent Labs  Lab 06/17/18 2330  INR 2.50   Cardiac Enzymes: No results for input(s):  CKTOTAL, CKMB, CKMBINDEX, TROPONINI in the last 168 hours. BNP (last 3 results) No results for input(s): PROBNP in the last 8760 hours. HbA1C: No results for input(s): HGBA1C in the last 72 hours. CBG: Recent Labs  Lab 06/22/18 1643 06/22/18 2137 06/23/18 0636 06/23/18 1132 06/23/18 1638  GLUCAP 147* 155* 136* 148* 163*   Lipid Profile: No results for input(s): CHOL, HDL, LDLCALC, TRIG, CHOLHDL, LDLDIRECT in the last 72 hours. Thyroid Function Tests: No results for input(s): TSH, T4TOTAL, FREET4, T3FREE, THYROIDAB in the last 72 hours. Anemia Panel: No results for input(s): VITAMINB12, FOLATE, FERRITIN, TIBC, IRON, RETICCTPCT in the last 72 hours. Urine analysis:    Component Value Date/Time   COLORURINE YELLOW 06/20/2018 0409   APPEARANCEUR CLEAR 06/20/2018 0409   LABSPEC 1.014 06/20/2018 0409   PHURINE 5.0 06/20/2018 0409   GLUCOSEU NEGATIVE 06/20/2018 0409   HGBUR NEGATIVE 06/20/2018 0409   BILIRUBINUR NEGATIVE 06/20/2018 0409   KETONESUR NEGATIVE 06/20/2018 0409   PROTEINUR NEGATIVE 06/20/2018 0409   NITRITE NEGATIVE 06/20/2018 0409   LEUKOCYTESUR NEGATIVE 06/20/2018 0409   Sepsis Labs: @LABRCNTIP (procalcitonin:4,lacticidven:4)  ) Recent Results (from the past 240 hour(s))  Blood Cultures x 2 sites     Status: None   Collection Time: 06/17/18 11:35 PM  Result Value Ref Range Status     Specimen Description BLOOD LEFT FOREARM  Final   Special Requests   Final    BOTTLES DRAWN AEROBIC AND ANAEROBIC Blood Culture adequate volume   Culture   Final    NO GROWTH 5 DAYS Performed at [facility], [address], [place], [place] [address]    Report Status 06/23/2018 FINAL  Final  Blood Cultures x 2 sites     Status: None   Collection Time: 06/17/18 11:47 PM  Result Value Ref Range Status   Specimen Description BLOOD RIGHT WRIST  Final   Special Requests   Final    BOTTLES DRAWN AEROBIC ONLY Blood Culture adequate volume   Culture   Final    NO GROWTH 5 DAYS Performed at [facility], [address] [place], [place] [address]    Report Status 06/23/2018 FINAL  Final  Surgical pcr screen     Status: None   Collection Time: 06/18/18  7:09 AM  Result Value Ref Range Status   MRSA, PCR NEGATIVE NEGATIVE Final   Staphylococcus aureus NEGATIVE NEGATIVE Final    Comment: (NOTE) The Xpert SA Assay (FDA approved for NASAL specimens in patients 55 years of age and older), is one component of a comprehensive surveillance program. It is not intended to diagnose infection nor to guide or monitor treatment. Performed at [facility], [address] [place], [place] [address]   Culture, fungus without smear     Status: None (Preliminary result)   Collection Time: 06/18/18  1:53 PM  Result Value Ref Range Status   Specimen Description TOE LEFT  Final   Special Requests NONE  Final   Culture   Final    NO FUNGUS ISOLATED AFTER 3 DAYS Performed at [facility], [address], [place], [place] [address]    Report Status PENDING  Incomplete  Aerobic/Anaerobic Culture (surgical/deep wound)     Status: None   Collection Time: 06/18/18  1:53 PM  Result Value Ref Range Status   Specimen Description TOE LEFT  Final   Special Requests NONE  Final   Gram Stain NO WBC SEEN NO ORGANISMS SEEN   Final  Culture   Final    RARE STREPTOCOCCUS GROUP G NO  ANAEROBES ISOLATED Performed at [facility], [address] [place], [place] [address]    Report Status 06/23/2018 FINAL  Final  Culture, blood (Routine X 2) w Reflex to ID Panel     Status: None (Preliminary result)   Collection Time: 06/22/18  2:14 PM  Result Value Ref Range Status   Specimen Description BLOOD LEFT HAND  Final   Special Requests   Final    BOTTLES DRAWN AEROBIC ONLY Blood Culture results may not be optimal due to an inadequate volume of blood received in culture bottles   Culture   Final    NO GROWTH < 24 HOURS Performed at [facility], [address], [place], [place] [address]    Report Status PENDING  Incomplete      Studies: No results found.  Scheduled Meds: . amiodarone  200 mg Oral Daily  . amoxicillin-clavulanate  1 tablet Oral Q12H  . azithromycin  250 mg Oral Daily  . bisacodyl  10 mg Rectal Once  . diltiazem  180 mg Oral Daily  . feeding supplement (ENSURE ENLIVE)  237 mL Oral BID BM  . folic acid  1 mg Oral Daily  . guaiFENesin  600 mg Oral BID  . insulin aspart  0-9 Units Subcutaneous TID WC  . ipratropium-albuterol  3 mL Nebulization TID  . multivitamin with minerals  1 tablet Oral Daily  . polyethylene glycol  17 g Oral Daily  . rivaroxaban  20 mg Oral Daily  . rosuvastatin  5 mg Oral q1800  . thiamine  100 mg Oral Daily   Or  . thiamine  100 mg Intravenous Daily  . umeclidinium-vilanterol  1 puff Inhalation Daily    Continuous Infusions: . lactated ringers 10 mL/hr at 06/18/18 1325     LOS: 6 days     Laverna Peace, MD [facility] Pager [phone]  If 7PM-7AM, please contact night-coverage [email or web address] Password [email or web address] 06/23/2018, 7:45 PM

## 2018-06-23 NOTE — Plan of Care (Signed)
  Problem: Safety: Goal: Ability to remain free from injury will improve Outcome: Progressing   

## 2018-06-23 NOTE — Progress Notes (Signed)
Nutrition Follow-up  DOCUMENTATION CODES:   Obesity unspecified  INTERVENTION:   - Continue Ensure Enlive BID (each provides 350 kcal and 20 g protein) - Continue MVI with minerals daily  NUTRITION DIAGNOSIS:   Increased nutrient needs related to wound healing as evidenced by estimated needs.  Ongoing  GOAL:   Patient will meet greater than or equal to 90% of their needs  Mostly meeting  MONITOR:   PO intake, Supplement acceptance, Skin  REASON FOR ASSESSMENT:   Malnutrition Screening Tool    ASSESSMENT:   72 yo male with PMH of PAF, HTN, obesity, COPD, OSA, chronic venous insufficiency, R great toe amputation, RA, and DM-2 who was admitted on 12/5 with osteomyelitis of L great toe ulcer.  Labs: glucose 158, Creatinine 1.37, GFR 51%/59%, CRP 19.1, Hgb 8.1, Hct 26.8% Meds: Ensure Enlive BID, Folvite daily, novolog TID with meals, MVI daily, miralax daily, thiamine 100 mg tablet daily  Pt reports appetite improving as he is feeling better. Would like to continue receiving Ensure BID in chocolate.  Completed NFPE today. No depletion.  NUTRITION - FOCUSED PHYSICAL EXAM:   Most Recent Value  Orbital Region  No depletion  Upper Arm Region  No depletion  Thoracic and Lumbar Region  No depletion  Buccal Region  No depletion  Temple Region  No depletion  Clavicle Bone Region  No depletion  Clavicle and Acromion Bone Region  No depletion  Scapular Bone Region  No depletion  Dorsal Hand  No depletion  Patellar Region  Unable to assess  Anterior Thigh Region  Unable to assess  Posterior Calf Region  Unable to assess  Edema (RD Assessment)  Unable to assess  Hair  Reviewed  Eyes  Reviewed  Mouth  Reviewed  Skin  Other (Comment) wounds, osteomyelitis L toe  Nails  Reviewed      Diet Order:  Pt mostly completing 100% of meals, per nsg documentation Diet Order            Diet Carb Modified Fluid consistency: Thin; Room service appropriate? Yes  Diet effective now               EDUCATION NEEDS:  Not appropriate for education at this time  Skin:  Skin Assessment: Skin Integrity Issues: Skin Integrity Issues:: Diabetic Ulcer Diabetic Ulcer: L great toe  Last BM:  12/5  Height:  Ht Readings from Last 1 Encounters:  06/18/18 6\' 6"  (1.981 m)    Weight:  Wt Readings from Last 1 Encounters:  06/18/18 (!) 151 kg    Ideal Body Weight:  97.3 kg  BMI:  Body mass index is 38.47 kg/m.  Estimated Nutritional Needs:   Kcal:  2400-2600  Protein:  150-170 gm  Fluid:  >/= 2.5 L  14/06/19, MS, RDN, LDN Pager: [phone]

## 2018-06-24 ENCOUNTER — Inpatient Hospital Stay (HOSPITAL_COMMUNITY): Payer: Medicare Other

## 2018-06-24 DIAGNOSIS — J181 Lobar pneumonia, unspecified organism: Secondary | ICD-10-CM

## 2018-06-24 LAB — CBC
HCT: 25.4 % — ABNORMAL LOW (ref 39.0–52.0)
Hemoglobin: 7.6 g/dL — ABNORMAL LOW (ref 13.0–17.0)
MCH: 26.6 pg (ref 26.0–34.0)
MCHC: 29.9 g/dL — ABNORMAL LOW (ref 30.0–36.0)
MCV: 88.8 fL (ref 80.0–100.0)
Platelets: 233 10*3/uL (ref 150–400)
RBC: 2.86 MIL/uL — ABNORMAL LOW (ref 4.22–5.81)
RDW: 17.2 % — ABNORMAL HIGH (ref 11.5–15.5)
WBC: 21.6 10*3/uL — ABNORMAL HIGH (ref 4.0–10.5)
nRBC: 0.6 % — ABNORMAL HIGH (ref 0.0–0.2)

## 2018-06-24 LAB — GLUCOSE, CAPILLARY
GLUCOSE-CAPILLARY: 130 mg/dL — AB (ref 70–99)
GLUCOSE-CAPILLARY: 153 mg/dL — AB (ref 70–99)
Glucose-Capillary: 150 mg/dL — ABNORMAL HIGH (ref 70–99)
Glucose-Capillary: 149 mg/dL — ABNORMAL HIGH (ref 70–99)

## 2018-06-24 MED ORDER — GUAIFENESIN ER 600 MG PO TB12: 600.0000 mg | ORAL_TABLET | Freq: Two times a day (BID) | ORAL | 0 refills | Status: AC | PRN

## 2018-06-24 MED ORDER — AZITHROMYCIN 250 MG PO TABS: 250.0000 mg | ORAL_TABLET | Freq: Every day | ORAL | 0 refills | Status: AC

## 2018-06-24 MED ORDER — AMOXICILLIN-POT CLAVULANATE 875-125 MG PO TABS: 1.0000 | ORAL_TABLET | Freq: Two times a day (BID) | ORAL | 0 refills | Status: DC

## 2018-06-24 MED ORDER — BENZONATATE 100 MG PO CAPS: 100.0000 mg | ORAL_CAPSULE | Freq: Four times a day (QID) | ORAL | 0 refills | Status: AC | PRN

## 2018-06-24 MED ORDER — IPRATROPIUM-ALBUTEROL 0.5-2.5 (3) MG/3ML IN SOLN
3.0000 mL | Freq: Four times a day (QID) | RESPIRATORY_TRACT | Status: DC
  Administered 2018-06-24: 3 mL via RESPIRATORY_TRACT
  Filled 2018-06-24: qty 3

## 2018-06-24 MED ORDER — BENAZEPRIL-HYDROCHLOROTHIAZIDE 10-12.5 MG PO TABS: ORAL_TABLET | ORAL | Status: DC

## 2018-06-24 MED ORDER — FUROSEMIDE 10 MG/ML IJ SOLN
INTRAMUSCULAR | Status: AC
  Filled 2018-06-24: qty 4

## 2018-06-24 MED ORDER — FUROSEMIDE 10 MG/ML IJ SOLN
60.0000 mg | Freq: Once | INTRAMUSCULAR | Status: DC
  Filled 2018-06-24: qty 6

## 2018-06-24 MED ORDER — FUROSEMIDE 10 MG/ML IJ SOLN
40.0000 mg | Freq: Once | INTRAMUSCULAR | Status: AC
  Administered 2018-06-24: 40 mg via INTRAVENOUS

## 2018-06-24 NOTE — Progress Notes (Signed)
Ambulated pt in hall approximately 25 feet. Pt heart rate increased from 83 baseline to 138. Sats increased from 94% on room air to 89%. Pt c/o dyspnea and tightness in chest. Immediately assisted pt to seated position in w/c and returned to room. Pt coached through purse lipped breathing and unable to recover baseline o2 saturation. Heart and o2 sats improved. Placed pt on 2L oxygen via nasal cannula. Notified MD, wife, and respiratory of pt condition.

## 2018-06-24 NOTE — Discharge Instructions (Signed)

## 2018-06-24 NOTE — Care Management Important Message (Signed)
Important Message  Patient Details  Name: [name] MRN: [id number] Date of Birth: 07/03/1946   Medicare Important Message Given:  Yes    Dorena Bodo 06/24/2018, 4:12 PM

## 2018-06-24 NOTE — Discharge Summary (Addendum)
Discharge Summary  [name] BJS:[id number] DOB: [birth year]  PCP: [name]  Admit date: 06/17/2018 Discharge date: 06/27/2018   Time spent: >35 minutes  Admitted From: home  Disposition:  home  Recommendations for Outpatient Follow-up:  1. Follow up with PCP in 1 week, check CBC ( WBC), BMP(Cr, K) 2. Will follow up with Dr. Samuella Cota (poduiatry) in one week, already arranged 3. Augmentin x 1 day(end date 12/16) 4. Mucinex, Tessalon pearls as needed.  5. Advised to hold benazepril/HCTZ given kidney function until repeat BMP by PCP, will continue on lasix daily and oral scheduled potassium ( home regimen)    Discharge Diagnoses:  Active Hospital Problems   Diagnosis Date Noted  . Osteomyelitis of great toe of left foot (HCC) 06/18/2018  . Acute exacerbation of CHF (congestive heart failure) (HCC) 06/25/2018  . Acute on chronic diastolic CHF (congestive heart failure) (HCC) 06/25/2018  . Cellulitis in diabetic foot (HCC) 06/18/2018  . Acute kidney injury superimposed on chronic kidney disease (HCC) 06/18/2018  . Diabetic ulcer of toe of left foot associated with diabetes mellitus due to underlying condition, with necrosis of bone (HCC)   . Osteomyelitis (HCC) 06/17/2018  . CKD stage 3 due to type 2 diabetes mellitus (HCC)   . Acute blood loss anemia 08/21/2016  . DM2 (diabetes mellitus, type 2) (HCC) 08/21/2016  . COPD (chronic obstructive pulmonary disease) (HCC) 12/12/2010  . OSA (obstructive sleep apnea) 12/12/2010  . Dyspnea 10/04/2010  . Chronic anticoagulation   . PAF (paroxysmal atrial fibrillation) [facility])     Resolved Hospital Problems  No resolved problems to display.    Discharge Condition: Stable   CODE STATUS:FULL  History of present illness:  [name] is a 72 y.o. year old male with medical history significant for DM type II, HTN, COPD,PAFon xarelto, chronic venous insufficiency , and previous amputation of the right  great toe who presented on 06/17/2018 with worsening pain and redness of left great toe and was found to have left toe osteomyelitis.Remaining hospital course addressed in problem based format below:   Hospital Course:   Diabetic left foot ulcer/osteomyelitis of great toe. Empirically treated with vancomycin and cefepime on arrival.  Patient remained afebrile, hemodynamically stable with no signs of sepsis.  He did have leukocytosis of 24.5 on admission that peaked to 28.7. He underwent left great toe amputation on 12/6 by Dr. Samuella Cota, podiatry.  His vancomycin and cefepime were continuedl before ultimate transition to Augmentin on 12/9.   Due to persistent leukocytosis, ID recommended extending Augmentin therapy to total 7 days, which he will complete on 12/16.  He will follow-up with podiatry 1 week after discharge.  On discharge his WBC had downtrended from 28 to15.5  Acute hypoxic respiratory failure, multifactorial etiology (left lower lobe pneumonia, CHFpEF exacerbation).  Initially diagnosed with left lower lobe pneumonia on 12/10.  His Augmentin started for his diabetic foot was transitioned to IV Unasyn and IV azithromycin for 1 day before transitioning back to Augmentin and oral azithromycin to cover for atypical bacteria.  Patient continued to have significant dyspnea and repeat chest x-ray on 12/12 showed significant vascular congestion congestion consistent with active flare of his diastolic CHF.  This occurred in the setting of holding patient's Lasix throughout hospital stay.  He was aggressively diuresed with IV Lasix 60 mg twice daily before transitioning to oral Lasix 40 mg twice daily on 12/14.  Patient had normal oxygen saturation on ambulatory test with plan to resume  home Lasix regimen to 40 mg daily and continue scheduled potassium (previous home regimen) on discharge.  On discharge day, he had significant improvement he has lower leg swelling, dyspnea on exertion and no longer requiring  oxygen.  Stressed importance of close follow-up with PCP for close monitoring.  Acute blood loss anemia.  Previous baseline of 13.4.  On admission found to be 9.7, remained stable throughout hospital stay did not require any blood transfusion.  No gross signs or symptoms of bleeding throughout hospital stay. Hgb of 8.1 on discharge  Acute kidney injury on CKD, stage III.  On admission found to have creatinine of 2.15 (baseline 1.4-1.5).  Presumed prerenal in the setting of nausea/vomiting and some episodes of diarrhea prior to admission.  Advised to hold his benazepril/HCTZ until follow-up with PCP.  With gentle hydration, IV diuresis for CHF flare creatinine improved to 1.26 on day of discharge  Type 2 diabetes.  Held metformin throughout hospital stay and monitored CBGs on sliding scale.  Will continue metformin on discharge.  Paroxysmal atrial fibrillation on Xarelto.  No changes made to Xarelto  OSA on CPAP CPAP to continue on discharge  Consultations:  ID, podiatry  Procedures/Studies: Left great toe amputation, 12/6.  Microbiology  12/5, blood cultures x2 were negative growth 12/6, operative cultures, rare Streptococcus group G 12/10, blood cultures x1 were negative growth   Discharge Exam: BP (!) 125/54 (BP Location: Right Arm)   Pulse 81   Temp 98 F (36.7 C) (Oral)   Resp 18   Ht 6\' 6"  (1.981 m)   Wt (!) 156.1 kg   SpO2 96%   BMI 39.77 kg/m   General: Obese male, sitting in bedside chair, no distress Eyes: EOMI, anicteric ENT: Oral Mucosa clear and moist Cardiovascular: regular rate and rhythm, no murmurs, rubs or gallops, 1+ pitting edema in bilateral lower extremities (near ankles) MSK: Dressing in place on left foot, Respiratory: Normal respiratory effort, minimal crackles at bases, otherwise no rhonchi, wheezing or respiratory distress Abdomen: soft, non-distended, non-tender, normal bowel sounds Skin: No Rash Neurologic: Grossly no focal neuro deficit.Mental  status AAOx3, speech normal, Psychiatric:Appropriate affect, and mood   Discharge Instructions You were cared for by a hospitalist during your hospital stay. If you have any questions about your discharge medications or the care you received while you were in the hospital after you are discharged, you can call the unit and asked to speak with the hospitalist on call if the hospitalist that took care of you is not available. Once you are discharged, your primary care physician will handle any further medical issues. Please note that NO REFILLS for any discharge medications will be authorized once you are discharged, as it is imperative that you return to your primary care physician (or establish a relationship with a primary care physician if you do not have one) for your aftercare needs so that they can reassess your need for medications and monitor your lab values.  Discharge Instructions    Diet - low sodium heart healthy   Complete by:  As directed    Diet - low sodium heart healthy   Complete by:  As directed    Increase activity slowly   Complete by:  As directed    Increase activity slowly   Complete by:  As directed      Allergies as of 06/27/2018   No Known Allergies     Medication List    TAKE these medications   amiodarone 200 MG  tablet Commonly known as:  PACERONE TAKE 1 TABLET BY MOUTH  DAILY   amoxicillin-clavulanate 875-125 MG tablet Commonly known as:  AUGMENTIN Take 1 tablet by mouth every 12 (twelve) hours for 2 days.   ANORO ELLIPTA 62.5-25 MCG/INH Aepb Generic drug:  umeclidinium-vilanterol INHALE 1 PUFF INTO THE LUNGS DAILY.   atenolol 50 MG tablet Commonly known as:  TENORMIN TAKE ONE TABLET BY MOUTH TWICE A DAY   azithromycin 250 MG tablet Commonly known as:  ZITHROMAX Take 1 tablet (250 mg total) by mouth daily for 4 days.   benazepril-hydrochlorthiazide 10-12.5 MG tablet Commonly known as:  LOTENSIN HCT HOLD UNTIL SEEN BY YOUR PCP What changed:     how much to take  how to take this  when to take this  additional instructions Notes to patient:  Wait to take this medicine until your Primary Doctor says OK   benzonatate 100 MG capsule Commonly known as:  TESSALON PERLES Take 1 capsule (100 mg total) by mouth every 6 (six) hours as needed for cough.   diltiazem 180 MG 24 hr capsule Commonly known as:  CARDIZEM CD TAKE 1 CAPSULE BY MOUTH  DAILY   furosemide 40 MG tablet Commonly known as:  LASIX Take 40 mg by mouth daily.   guaiFENesin 600 MG 12 hr tablet Commonly known as:  MUCINEX Take 1 tablet (600 mg total) by mouth 2 (two) times daily as needed for to loosen phlegm.   HYDROcodone-acetaminophen 5-325 MG tablet Commonly known as:  NORCO Take 1 tablet by mouth 2 (two) times daily as needed for moderate pain.   metFORMIN 500 MG 24 hr tablet Commonly known as:  GLUCOPHAGE-XR Take 2 tablets by mouth 2 (two) times daily.   omeprazole 20 MG capsule Commonly known as:  PRILOSEC TAKE 1 CAPSULE BY MOUTH  DAILY   oxyCODONE 5 MG immediate release tablet Commonly known as:  ROXICODONE Take 1 tablet (5 mg total) by mouth every 6 (six) hours as needed for severe pain.   potassium chloride SA 20 MEQ tablet Commonly known as:  K-DUR,KLOR-CON TAKE ONE TABLET BY MOUTH TWO TIMES A DAY   PROAIR HFA 108 (90 Base) MCG/ACT inhaler Generic drug:  albuterol INHALE TWO PUFFS BY MOUTH EVERY 4 HOURS AS NEEDED FOR WHEEZING AND  SHORTNESS OF BREATH What changed:  See the new instructions.   rosuvastatin 5 MG tablet Commonly known as:  CRESTOR Take 5 mg by mouth daily.   XARELTO 20 MG Tabs tablet Generic drug:  rivaroxaban TAKE ONE TABLET BY MOUTH DAILY WITH SUPPER What changed:  See the new instructions. Notes to patient:  You had this medication at breakfast this morning 06/25/18, Next dose tomorrow evening with dinner 06/26/18      No Known Allergies Follow-up Information    Park Liter, DPM Follow up in 1 week(s).     Specialty:  Podiatry Contact information: [address] [place] [address] [phone]            The results of significant diagnostics from this hospitalization (including imaging, microbiology, ancillary and laboratory) are listed below for reference.    Significant Diagnostic Studies: Dg Chest 2 View  Result Date: 06/22/2018 CLINICAL DATA:  Shortness of breath, left-sided chest pain, congestion for for 5 days, recent partial amputation of the toe EXAM: CHEST - 2 VIEW COMPARISON:  Chest x-ray of 10/06/2016 FINDINGS: There is parenchymal opacity apparently medially at the left lung base most consistent with left lower lobe pneumonia and  probable small left effusion. There is also cardiomegaly present and very minimal pulmonary vascular congestion superimposed cannot be excluded. Mediastinal and hilar contours are unremarkable. No acute bony abnormality is seen. IMPRESSION: 1. Pneumonia which appears to be within the left lower lobe posteriorly with small left pleural effusion. 2. Cardiomegaly. Cannot exclude very mild pulmonary vascular congestion. Electronically Signed   By: Dwyane Dee M.D.   On: 06/22/2018 13:32   Dg Chest Port 1 View  Result Date: 06/24/2018 CLINICAL DATA:  Pneumonia EXAM: PORTABLE CHEST 1 VIEW COMPARISON:  06/22/2018, 10/06/2016 FINDINGS: Increased small bilateral effusions. Worsening airspace disease at the left base. Cardiomegaly with vascular congestion and diffuse interstitial opacity consistent with edema. IMPRESSION: 1. Increased small left greater than right pleural effusions with worsening airspace disease at the left base which may reflect atelectasis or pneumonia 2. Cardiomegaly with vascular congestion and diffuse interstitial opacity suspect for edema Electronically Signed   By: Jasmine Pang M.D.   On: 06/24/2018 22:46   Dg Foot 2 Views Left  Result Date: 06/18/2018 CLINICAL DATA:  Post-operative state EXAM: LEFT FOOT - 2 VIEW COMPARISON:  None.  FINDINGS: Status post amputation at the first proximal phalanx. Expected postsurgical changes within the surrounding soft tissues. Remainder of the osseous structures of the LEFT foot appear intact and normal in mineralization. IMPRESSION: Status post amputation at the first proximal phalanx. No evidence of surgical complicating feature. Electronically Signed   By: Bary Richard M.D.   On: 06/18/2018 15:02   Dg Foot Complete Left  Result Date: 06/17/2018 Please see detailed radiograph report in office note.  Vas [place] Lower Extremity Venous (dvt)  Result Date: 06/22/2018  Lower Venous Study Indications: Swelling.  Limitations: Body habitus, poor ultrasound/tissue interface and bandages. Performing Technologist: Gertie Fey MHA, RDMS, RVT, RDCS  Examination Guidelines: A complete evaluation includes B-mode imaging, spectral Doppler, color Doppler, and power Doppler as needed of all accessible portions of each vessel. Bilateral testing is considered an integral part of a complete examination. Limited examinations for reoccurring indications may be performed as noted.  Right Venous Findings: +---+---------------+---------+-----------+----------+-------+    CompressibilityPhasicitySpontaneityPropertiesSummary +---+---------------+---------+-----------+----------+-------+ CFVFull           Yes      Yes                          +---+---------------+---------+-----------+----------+-------+  Left Venous Findings: +---------+---------------+---------+-----------+----------+--------------+          CompressibilityPhasicitySpontaneityPropertiesSummary        +---------+---------------+---------+-----------+----------+--------------+ CFV      Full           Yes      Yes                                 +---------+---------------+---------+-----------+----------+--------------+ SFJ      Full                                                         +---------+---------------+---------+-----------+----------+--------------+ FV Prox  Full                                                        +---------+---------------+---------+-----------+----------+--------------+  FV Mid   Full                                                        +---------+---------------+---------+-----------+----------+--------------+ FV Distal                                             Not visualized +---------+---------------+---------+-----------+----------+--------------+ PFV      Full                                                        +---------+---------------+---------+-----------+----------+--------------+ POP      Full           Yes      Yes                                 +---------+---------------+---------+-----------+----------+--------------+ PTV                                                   Not visualized +---------+---------------+---------+-----------+----------+--------------+ PERO                                                  Not visualized +---------+---------------+---------+-----------+----------+--------------+    Summary: Right: No evidence of common femoral vein obstruction. Left: There is no evidence of deep vein thrombosis in the lower extremity. However, portions of this examination were limited- see technologist comments above. No cystic structure found in the popliteal fossa. Ultrasound characteristics of enlarged lymph  nodes noted in the groin.  *See table(s) above for measurements and observations. Electronically signed by Waverly Ferrari MD on 06/22/2018 at 1:59:07 PM.    Final     Microbiology: Recent Results (from the past 240 hour(s))  Blood Cultures x 2 sites     Status: None   Collection Time: 06/17/18 11:35 PM  Result Value Ref Range Status   Specimen Description BLOOD LEFT FOREARM  Final   Special Requests   Final    BOTTLES DRAWN AEROBIC AND ANAEROBIC Blood Culture  adequate volume   Culture   Final    NO GROWTH 5 DAYS Performed at [facility], [address], [place], [place] [address]    Report Status 06/23/2018 FINAL  Final  Blood Cultures x 2 sites     Status: None   Collection Time: 06/17/18 11:47 PM  Result Value Ref Range Status   Specimen Description BLOOD RIGHT WRIST  Final   Special Requests   Final    BOTTLES DRAWN AEROBIC ONLY Blood Culture adequate volume   Culture   Final    NO GROWTH 5 DAYS Performed at [facility], [address] [place], [place] [address]    Report Status 06/23/2018  FINAL  Final  Surgical pcr screen     Status: None   Collection Time: 06/18/18  7:09 AM  Result Value Ref Range Status   MRSA, PCR NEGATIVE NEGATIVE Final   Staphylococcus aureus NEGATIVE NEGATIVE Final    Comment: (NOTE) The Xpert SA Assay (FDA approved for NASAL specimens in patients 33 years of age and older), is one component of a comprehensive surveillance program. It is not intended to diagnose infection nor to guide or monitor treatment. Performed at [facility], [address] [place], [place] [address]   Culture, fungus without smear     Status: None (Preliminary result)   Collection Time: 06/18/18  1:53 PM  Result Value Ref Range Status   Specimen Description TOE LEFT  Final   Special Requests NONE  Final   Culture   Final    NO FUNGUS ISOLATED AFTER 7 DAYS Performed at [facility], [address], [place], [place] [address]    Report Status PENDING  Incomplete  Aerobic/Anaerobic Culture (surgical/deep wound)     Status: None   Collection Time: 06/18/18  1:53 PM  Result Value Ref Range Status   Specimen Description TOE LEFT  Final   Special Requests NONE  Final   Gram Stain NO WBC SEEN NO ORGANISMS SEEN   Final   Culture   Final    RARE STREPTOCOCCUS GROUP G NO ANAEROBES ISOLATED Performed at [facility], [address], [place], [place] [address]    Report Status 06/23/2018 FINAL   Final  Culture, blood (Routine X 2) w Reflex to ID Panel     Status: None (Preliminary result)   Collection Time: 06/22/18  2:14 PM  Result Value Ref Range Status   Specimen Description BLOOD LEFT HAND  Final   Special Requests   Final    BOTTLES DRAWN AEROBIC ONLY Blood Culture results may not be optimal due to an inadequate volume of blood received in culture bottles   Culture   Final    NO GROWTH 4 DAYS Performed at [facility], [address], [place], [place] [address]    Report Status PENDING  Incomplete     Labs: Basic Metabolic Panel: Recent Labs  Lab 06/21/18 0912 06/23/18 0233 06/25/18 0829 06/26/18 0257 06/27/18 0413  NA 136 138 140 139 139  K 4.3 4.3 4.1 3.5 3.4*  CL 107 109 107 101 100  CO2 20* 21* 22 27 27   GLUCOSE 165* 158* 166* 176* 156*  BUN 18 12 12 15 15   CREATININE 1.52* 1.37* 1.28* 1.21 1.26*  CALCIUM 8.7* 8.4* 8.8* 8.5* 8.5*   Liver Function Tests: No results for input(s): AST, ALT, ALKPHOS, BILITOT, PROT, ALBUMIN in the last 168 hours. No results for input(s): LIPASE, AMYLASE in the last 168 hours. No results for input(s): AMMONIA in the last 168 hours. CBC: Recent Labs  Lab 06/23/18 0233 06/24/18 0139 06/25/18 0829 06/26/18 0257 06/27/18 0413  WBC 26.5* 21.6* 18.2* 17.2* 15.5*  HGB 8.1* 7.6* 9.3* 8.3* 8.1*  HCT 26.8* 25.4* 31.7* 27.9* 27.8*  MCV 89.3 88.8 89.8 88.9 89.4  PLT 208 233 266 280 306   Cardiac Enzymes: No results for input(s): CKTOTAL, CKMB, CKMBINDEX, TROPONINI in the last 168 hours. BNP: BNP (last 3 results) No results for input(s): BNP in the last 8760 hours.  ProBNP (last 3 results) No results for input(s): PROBNP in the last 8760 hours.  CBG: Recent Labs  Lab 06/26/18 [phone]  06/26/18 1129 06/26/18 1616 06/26/18 2123 06/27/18 0611  GLUCAP 172* 160* 165* 156* 156*       Signed:  Laverna Peace, MD [facility] 06/27/2018, 11:03 AM

## 2018-06-24 NOTE — Progress Notes (Signed)
PROGRESS NOTE  [name] YWV:[id number] DOB: May 16, 1946 DOA: 06/17/2018 PCP: [name]  HPI/Brief Narrative  [name] is a 72 y.o. year old male with medical history significant for DM type II, HTN, COPD,PAFon xarelto, chronic venous insufficiency , and previous amputation of the right great toe who presented on 06/17/2018 with worsening pain and redness of left great toe and was found to have left toe osteomyelitis.  Course complicated by recent diagnosis of aspiration pneumonitis with left lower lobe pneumonia.  Subjective Reports more labored breathing Still coughing with some production Nose bleeding  Assessment/Plan:  #Left lower lobe pneumonia, improving.   We will continue oral Augmentin and azithromycin,    #Acute hypoxic respiratory failure Now requiring oxygen and had desaturation to 89% on ambulatory testing,, crackles on exam concern for potential CHF extubation given we have been holding his home Lasix. Status post IV Lasix earlier today with some improvement we will continue and monitor for improvement.  Increase duo nebs to every 6 hours,  Continue to treat pneumonia as above.  #Diabetic L foot ulcer/osteomyelitis of great toe.  Status post surgical cure via podiatry intervention.  Given persistent leukocytosis during hospital course ID recommended further therapy with Augmentin for total 7 days.  We will continue such.  Leukocytosis, improving.  Peak WBC of 20.7.  Now down trended to 26.5.  Patient has remained afebrile.  Treated as above  #AKI on CKD stage III. Creatinine 1.5, back at baseline.  Avoid nephrotoxins.  #Acute blood loss anemia, stable.  Related to recent surgery during hospitalization.  Hemoglobin on presentation 9.7 remained stable on repeat monitoring at 8, no gross signs or symptoms of bleeding.  #Asthma atrial fibrillation, rate controlled.  Continue home amiodarone and Cardizem.  Tolerating home Xarelto.  #Type  2 diabetes.  A1c 6.4.  Continue sliding scale coverage.  Code Status: Full code  Family Communication: No family at bedside  Disposition Plan: Anticipate discharge next 24 hours if remains clinically stable on oral antibiotic regimen.,  Will repeat CBC to ensure continue improvement   Consultants:  Podiatry, infectious disease      Antimicrobials: Anti-infectives (From admission, onward)   Start     Dose/Rate Route Frequency Ordered Stop   06/24/18 0000  amoxicillin-clavulanate (AUGMENTIN) 875-125 MG tablet     1 tablet Oral Every 12 hours 06/24/18 0829 06/29/18 2359   06/24/18 0000  azithromycin (ZITHROMAX) 250 MG tablet     250 mg Oral Daily 06/24/18 0829 06/28/18 2359   06/23/18 2200  amoxicillin-clavulanate (AUGMENTIN) 875-125 MG per tablet 1 tablet     1 tablet Oral Every 12 hours 06/23/18 1546 06/28/18 2159   06/23/18 1600  azithromycin (ZITHROMAX) tablet 250 mg     250 mg Oral Daily 06/23/18 1546 06/27/18 0959   06/22/18 2000  Ampicillin-Sulbactam (UNASYN) 3 g in sodium chloride 0.9 % 100 mL IVPB  Status:  Discontinued     3 g 200 mL/hr over 30 Minutes Intravenous Every 6 hours 06/22/18 1919 06/23/18 1544   06/22/18 1830  cefTRIAXone (ROCEPHIN) 2 g in sodium chloride 0.9 % 100 mL IVPB  Status:  Discontinued     2 g 200 mL/hr over 30 Minutes Intravenous Every 24 hours 06/22/18 1811 06/22/18 1817   06/22/18 1830  azithromycin (ZITHROMAX) 500 mg in sodium chloride 0.9 % 250 mL IVPB  Status:  Discontinued     500 mg 250 mL/hr over 60 Minutes Intravenous Every 24 hours 06/22/18 1811 06/23/18  1544   06/21/18 2200  amoxicillin-clavulanate (AUGMENTIN) 875-125 MG per tablet 1 tablet  Status:  Discontinued     1 tablet Oral Every 12 hours 06/21/18 1239 06/22/18 1811   06/18/18 2200  vancomycin (VANCOCIN) 1,750 mg in sodium chloride 0.9 % 500 mL IVPB  Status:  Discontinued     1,750 mg 250 mL/hr over 120 Minutes Intravenous Every 24 hours 06/18/18 0127 06/21/18 1100   06/18/18  1413  vancomycin (VANCOCIN) powder  Status:  Discontinued       As needed 06/18/18 1413 06/18/18 1422   06/18/18 1000  ceFEPIme (MAXIPIME) 2 g in sodium chloride 0.9 % 100 mL IVPB  Status:  Discontinued     2 g 200 mL/hr over 30 Minutes Intravenous Every 12 hours 06/18/18 0127 06/21/18 1232   06/18/18 0000  vancomycin (VANCOCIN) 2,000 mg in sodium chloride 0.9 % 500 mL IVPB     2,000 mg 250 mL/hr over 120 Minutes Intravenous  Once 06/17/18 2350 06/18/18 0326   06/18/18 0000  ceFEPIme (MAXIPIME) 2 g in sodium chloride 0.9 % 100 mL IVPB  Status:  Discontinued     2 g 200 mL/hr over 30 Minutes Intravenous Every 8 hours 06/17/18 2350 06/18/18 0127         DVT prophylaxis: Xarelto   Objective: Vitals:   06/24/18 1010 06/24/18 1012 06/24/18 1416 06/24/18 1506  BP:    (!) 149/58  Pulse:   86 84  Resp:   18 20  Temp:    98 F (36.7 C)  TempSrc:    Oral  SpO2: (!) 89% 98% 94% 100%  Weight:      Height:        Intake/Output Summary (Last 24 hours) at 06/24/2018 1841 Last data filed at 06/24/2018 1600 Gross per 24 hour  Intake 840 ml  Output 1700 ml  Net -860 ml   Filed Weights   06/17/18 2018 06/18/18 1326  Weight: (!) 151 kg (!) 151 kg    Exam:  Constitutional: Obese male, no distress Eyes: EOMI, anicteric, normal conjunctivae ENMT: Oropharynx with moist mucous membranes, normal dentition Cardiovascular: RRR no MRGs, with no peripheral edema Respiratory: Tachypnea, no accessory muscle use, on 2 L nasal cannula, crackles at bases otherwise no wheezing or rhonchi Abdomen: Soft,non-tender,  Skin: Dressing in place on left foot Neurologic: Grossly no focal neuro deficit. Psychiatric:Appropriate affect, and mood. Mental status AAOx3  Data Reviewed: CBC: Recent Labs  Lab 06/17/18 2330  06/19/18 0534 06/20/18 0213 06/21/18 0912 06/22/18 0316 06/23/18 0233 06/24/18 0139  WBC 29.0*   < > 18.9* 25.0* 27.6* 28.7* 26.5* 21.6*  NEUTROABS 23.2*  --  14.2*  --   --   --    --   --   HGB 9.7*   < > 9.0* 9.1* 8.3* 8.1* 8.1* 7.6*  HCT 31.8*   < > 31.0* 30.7* 27.7* 28.0* 26.8* 25.4*  MCV 88.8   < > 91.2 89.8 89.6 90.0 89.3 88.8  PLT 206   < > 185 234 231 221 208 233   < > = values in this interval not displayed.   Basic Metabolic Panel: Recent Labs  Lab 06/18/18 0614 06/19/18 0534 06/20/18 0213 06/21/18 0912 06/23/18 0233  NA 140 140 134* 136 138  K 4.5 5.0 4.4 4.3 4.3  CL 107 109 104 107 109  CO2 20* 23 20* 20* 21*  GLUCOSE 124* 140* 171* 165* 158*  BUN 45* 34* 27* 18 12  CREATININE 2.06*  1.72* 1.54* 1.52* 1.37*  CALCIUM 8.6* 8.8* 8.7* 8.7* 8.4*   GFR: Estimated Creatinine Clearance: 79.4 mL/min (A) (by C-G formula based on SCr of 1.37 mg/dL (H)). Liver Function Tests: No results for input(s): AST, ALT, ALKPHOS, BILITOT, PROT, ALBUMIN in the last 168 hours. No results for input(s): LIPASE, AMYLASE in the last 168 hours. No results for input(s): AMMONIA in the last 168 hours. Coagulation Profile: Recent Labs  Lab 06/17/18 2330  INR 2.50   Cardiac Enzymes: No results for input(s): CKTOTAL, CKMB, CKMBINDEX, TROPONINI in the last 168 hours. BNP (last 3 results) No results for input(s): PROBNP in the last 8760 hours. HbA1C: No results for input(s): HGBA1C in the last 72 hours. CBG: Recent Labs  Lab 06/23/18 1638 06/23/18 2128 06/24/18 0656 06/24/18 1134 06/24/18 1653  GLUCAP 163* 159* 150* 149* 130*   Lipid Profile: No results for input(s): CHOL, HDL, LDLCALC, TRIG, CHOLHDL, LDLDIRECT in the last 72 hours. Thyroid Function Tests: No results for input(s): TSH, T4TOTAL, FREET4, T3FREE, THYROIDAB in the last 72 hours. Anemia Panel: No results for input(s): VITAMINB12, FOLATE, FERRITIN, TIBC, IRON, RETICCTPCT in the last 72 hours. Urine analysis:    Component Value Date/Time   COLORURINE YELLOW 06/20/2018 0409   APPEARANCEUR CLEAR 06/20/2018 0409   LABSPEC 1.014 06/20/2018 0409   PHURINE 5.0 06/20/2018 0409   GLUCOSEU NEGATIVE  06/20/2018 0409   HGBUR NEGATIVE 06/20/2018 0409   BILIRUBINUR NEGATIVE 06/20/2018 0409   KETONESUR NEGATIVE 06/20/2018 0409   PROTEINUR NEGATIVE 06/20/2018 0409   NITRITE NEGATIVE 06/20/2018 0409   LEUKOCYTESUR NEGATIVE 06/20/2018 0409   Sepsis Labs: @LABRCNTIP (procalcitonin:4,lacticidven:4)  ) Recent Results (from the past 240 hour(s))  Blood Cultures x 2 sites     Status: None   Collection Time: 06/17/18 11:35 PM  Result Value Ref Range Status   Specimen Description BLOOD LEFT FOREARM  Final   Special Requests   Final    BOTTLES DRAWN AEROBIC AND ANAEROBIC Blood Culture adequate volume   Culture   Final    NO GROWTH 5 DAYS Performed at [facility], [address], [place], [place] [address]    Report Status 06/23/2018 FINAL  Final  Blood Cultures x 2 sites     Status: None   Collection Time: 06/17/18 11:47 PM  Result Value Ref Range Status   Specimen Description BLOOD RIGHT WRIST  Final   Special Requests   Final    BOTTLES DRAWN AEROBIC ONLY Blood Culture adequate volume   Culture   Final    NO GROWTH 5 DAYS Performed at [facility], [address], [place], [place] [address]    Report Status 06/23/2018 FINAL  Final  Surgical pcr screen     Status: None   Collection Time: 06/18/18  7:09 AM  Result Value Ref Range Status   MRSA, PCR NEGATIVE NEGATIVE Final   Staphylococcus aureus NEGATIVE NEGATIVE Final    Comment: (NOTE) The Xpert SA Assay (FDA approved for NASAL specimens in patients 12 years of age and older), is one component of a comprehensive surveillance program. It is not intended to diagnose infection nor to guide or monitor treatment. Performed at [facility], [address], [place], [place] [address]   Culture, fungus without smear     Status: None (Preliminary result)   Collection Time: 06/18/18  1:53 PM  Result Value Ref Range Status   Specimen Description TOE LEFT  Final   Special Requests NONE  Final  Culture   Final    NO  FUNGUS ISOLATED AFTER 3 DAYS Performed at [facility], [address], [place], [place] [address]    Report Status PENDING  Incomplete  Aerobic/Anaerobic Culture (surgical/deep wound)     Status: None   Collection Time: 06/18/18  1:53 PM  Result Value Ref Range Status   Specimen Description TOE LEFT  Final   Special Requests NONE  Final   Gram Stain NO WBC SEEN NO ORGANISMS SEEN   Final   Culture   Final    RARE STREPTOCOCCUS GROUP G NO ANAEROBES ISOLATED Performed at [facility], [address], [place], [place] [address]    Report Status 06/23/2018 FINAL  Final  Culture, blood (Routine X 2) w Reflex to ID Panel     Status: None (Preliminary result)   Collection Time: 06/22/18  2:14 PM  Result Value Ref Range Status   Specimen Description BLOOD LEFT HAND  Final   Special Requests   Final    BOTTLES DRAWN AEROBIC ONLY Blood Culture results may not be optimal due to an inadequate volume of blood received in culture bottles   Culture   Final    NO GROWTH 2 DAYS Performed at [facility], [address] [place], [place] [address]    Report Status PENDING  Incomplete      Studies: No results found.  Scheduled Meds: . amiodarone  200 mg Oral Daily  . amoxicillin-clavulanate  1 tablet Oral Q12H  . azithromycin  250 mg Oral Daily  . bisacodyl  10 mg Rectal Once  . diltiazem  180 mg Oral Daily  . feeding supplement (ENSURE ENLIVE)  237 mL Oral BID BM  . folic acid  1 mg Oral Daily  . furosemide      . furosemide  60 mg Intravenous Once  . guaiFENesin  600 mg Oral BID  . insulin aspart  0-9 Units Subcutaneous TID WC  . ipratropium-albuterol  3 mL Nebulization Q6H  . multivitamin with minerals  1 tablet Oral Daily  . polyethylene glycol  17 g Oral Daily  . rivaroxaban  20 mg Oral Daily  . rosuvastatin  5 mg Oral q1800  . thiamine  100 mg Oral Daily   Or  . thiamine  100 mg Intravenous Daily    Continuous Infusions: . lactated ringers 10 mL/hr at  06/18/18 1325     LOS: 7 days     Laverna Peace, MD [facility] Pager [phone]  If 7PM-7AM, please contact night-coverage [email or web address] Password [facility] 06/24/2018, 6:41 PM

## 2018-06-25 DIAGNOSIS — I5033 Acute on chronic diastolic (congestive) heart failure: Secondary | ICD-10-CM | POA: Diagnosis not present

## 2018-06-25 DIAGNOSIS — I480 Paroxysmal atrial fibrillation: Secondary | ICD-10-CM

## 2018-06-25 DIAGNOSIS — I509 Heart failure, unspecified: Secondary | ICD-10-CM

## 2018-06-25 DIAGNOSIS — J449 Chronic obstructive pulmonary disease, unspecified: Secondary | ICD-10-CM

## 2018-06-25 DIAGNOSIS — N179 Acute kidney failure, unspecified: Secondary | ICD-10-CM

## 2018-06-25 DIAGNOSIS — N189 Chronic kidney disease, unspecified: Secondary | ICD-10-CM

## 2018-06-25 DIAGNOSIS — N183 Chronic kidney disease, stage 3 (moderate): Secondary | ICD-10-CM

## 2018-06-25 DIAGNOSIS — G4733 Obstructive sleep apnea (adult) (pediatric): Secondary | ICD-10-CM

## 2018-06-25 DIAGNOSIS — E1122 Type 2 diabetes mellitus with diabetic chronic kidney disease: Secondary | ICD-10-CM

## 2018-06-25 LAB — GLUCOSE, CAPILLARY
Glucose-Capillary: 134 mg/dL — ABNORMAL HIGH (ref 70–99)
Glucose-Capillary: 179 mg/dL — ABNORMAL HIGH (ref 70–99)
Glucose-Capillary: 146 mg/dL — ABNORMAL HIGH (ref 70–99)
Glucose-Capillary: 150 mg/dL — ABNORMAL HIGH (ref 70–99)

## 2018-06-25 LAB — CBC
HCT: 31.7 % — ABNORMAL LOW (ref 39.0–52.0)
Hemoglobin: 9.3 g/dL — ABNORMAL LOW (ref 13.0–17.0)
MCH: 26.3 pg (ref 26.0–34.0)
MCHC: 29.3 g/dL — ABNORMAL LOW (ref 30.0–36.0)
MCV: 89.8 fL (ref 80.0–100.0)
Platelets: 266 10*3/uL (ref 150–400)
RBC: 3.53 MIL/uL — ABNORMAL LOW (ref 4.22–5.81)
RDW: 17.3 % — AB (ref 11.5–15.5)
WBC: 18.2 10*3/uL — ABNORMAL HIGH (ref 4.0–10.5)
nRBC: 1 % — ABNORMAL HIGH (ref 0.0–0.2)

## 2018-06-25 LAB — BASIC METABOLIC PANEL
Anion gap: 11 (ref 5–15)
BUN: 12 mg/dL (ref 8–23)
CO2: 22 mmol/L (ref 22–32)
CREATININE: 1.28 mg/dL — AB (ref 0.61–1.24)
Calcium: 8.8 mg/dL — ABNORMAL LOW (ref 8.9–10.3)
Chloride: 107 mmol/L (ref 98–111)
GFR calc Af Amer: >60 mL/min (ref >60)
GFR calc non Af Amer: 56 mL/min — ABNORMAL LOW (ref >60)
Glucose, Bld: 166 mg/dL — ABNORMAL HIGH (ref 70–99)
Potassium: 4.1 mmol/L (ref 3.5–5.1)
Sodium: 140 mmol/L (ref 135–145)

## 2018-06-25 MED ORDER — IPRATROPIUM-ALBUTEROL 0.5-2.5 (3) MG/3ML IN SOLN
3.0000 mL | Freq: Three times a day (TID) | RESPIRATORY_TRACT | Status: DC
  Administered 2018-06-25 – 2018-06-26 (×5): 3 mL via RESPIRATORY_TRACT
  Filled 2018-06-25 (×5): qty 3

## 2018-06-25 MED ORDER — FUROSEMIDE 10 MG/ML IJ SOLN
80.0000 mg | Freq: Once | INTRAMUSCULAR | Status: AC
  Administered 2018-06-25: 80 mg via INTRAVENOUS
  Filled 2018-06-25: qty 8

## 2018-06-25 NOTE — Plan of Care (Signed)
  Problem: Pain Managment: Goal: General experience of comfort will improve Outcome: Progressing   Problem: Safety: Goal: Ability to remain free from injury will improve Outcome: Progressing   

## 2018-06-25 NOTE — Progress Notes (Signed)
Subjective:  Patient ID: [name], male    DOB: 07-31-1945,  MRN: [id number]  Seen at bedside. Denies pedal complaints. States the pneumonia is improving.  Objective:   Vitals:   06/24/18 2110 06/25/18 0415  BP:  (!) 160/56  Pulse:  70  Resp:    Temp:  98 F (36.7 C)  SpO2: 96% 96%   General AA&O x3. Normal mood and affect.  Vascular Dorsalis pedis and posterior tibial pulses 2/4 bilat. Brisk capillary refill to all digits. Pedal hair present.  Neurologic Epicritic sensation grossly intact.  Dermatologic Surgical site healing well. Slight edema, no purulence, no active erythema along incision. No crepitus or fluctuance. Left leg edema, cellulitis resolving.  Orthopedic: MMT 5/5 in dorsiflexion, plantarflexion, inversion, and eversion. Normal joint ROM without pain or crepitus.   Results for orders placed or performed during the hospital encounter of 06/17/18 (from the past 24 hour(s))  Glucose, capillary     Status: Abnormal   Collection Time: 06/24/18 11:34 AM  Result Value Ref Range   Glucose-Capillary 149 (H) 70 - 99 mg/dL  Glucose, capillary     Status: Abnormal   Collection Time: 06/24/18  4:53 PM  Result Value Ref Range   Glucose-Capillary 130 (H) 70 - 99 mg/dL  Glucose, capillary     Status: Abnormal   Collection Time: 06/24/18  8:42 PM  Result Value Ref Range   Glucose-Capillary 153 (H) 70 - 99 mg/dL   Comment 1 Notify RN   Glucose, capillary     Status: Abnormal   Collection Time: 06/25/18  6:05 AM  Result Value Ref Range   Glucose-Capillary 134 (H) 70 - 99 mg/dL   Comment 1 Notify RN   CBC     Status: Abnormal   Collection Time: 06/25/18  8:29 AM  Result Value Ref Range   WBC 18.2 (H) 4.0 - 10.5 K/uL   RBC 3.53 (L) 4.22 - 5.81 MIL/uL   Hemoglobin 9.3 (L) 13.0 - 17.0 g/dL   HCT 24.2 (L) 68.3 - 41.9 %   MCV 89.8 80.0 - 100.0 fL   MCH 26.3 26.0 - 34.0 pg   MCHC 29.3 (L) 30.0 - 36.0 g/dL   RDW 62.2 (H) 29.7 - 98.9 %   Platelets 266 150 - 400 K/uL   nRBC 1.0 (H) 0.0 - 0.2 %   Results for orders placed or performed during the hospital encounter of 06/17/18  Blood Cultures x 2 sites     Status: None   Collection Time: 06/17/18 11:35 PM  Result Value Ref Range Status   Specimen Description BLOOD LEFT FOREARM  Final   Special Requests   Final    BOTTLES DRAWN AEROBIC AND ANAEROBIC Blood Culture adequate volume   Culture   Final    NO GROWTH 5 DAYS Performed at [facility], [address], [place], [place] [address]    Report Status 06/23/2018 FINAL  Final  Blood Cultures x 2 sites     Status: None   Collection Time: 06/17/18 11:47 PM  Result Value Ref Range Status   Specimen Description BLOOD RIGHT WRIST  Final   Special Requests   Final    BOTTLES DRAWN AEROBIC ONLY Blood Culture adequate volume   Culture   Final    NO GROWTH 5 DAYS Performed at [facility], [address] [place], [place] [address]    Report Status 06/23/2018 FINAL  Final  Surgical pcr screen     Status:  None   Collection Time: 06/18/18  7:09 AM  Result Value Ref Range Status   MRSA, PCR NEGATIVE NEGATIVE Final   Staphylococcus aureus NEGATIVE NEGATIVE Final    Comment: (NOTE) The Xpert SA Assay (FDA approved for NASAL specimens in patients 24 years of age and older), is one component of a comprehensive surveillance program. It is not intended to diagnose infection nor to guide or monitor treatment. Performed at [facility], [address], [place], [place] [address]   Culture, fungus without smear     Status: None (Preliminary result)   Collection Time: 06/18/18  1:53 PM  Result Value Ref Range Status   Specimen Description TOE LEFT  Final   Special Requests NONE  Final   Culture   Final    NO FUNGUS ISOLATED AFTER 3 DAYS Performed at [facility], [address], [place], [place] [address]    Report Status PENDING  Incomplete  Aerobic/Anaerobic Culture (surgical/deep wound)     Status: None   Collection Time: 06/18/18   1:53 PM  Result Value Ref Range Status   Specimen Description TOE LEFT  Final   Special Requests NONE  Final   Gram Stain NO WBC SEEN NO ORGANISMS SEEN   Final   Culture   Final    RARE STREPTOCOCCUS GROUP G NO ANAEROBES ISOLATED Performed at [facility], [address] [place], [place] [address]    Report Status 06/23/2018 FINAL  Final  Culture, blood (Routine X 2) w Reflex to ID Panel     Status: None (Preliminary result)   Collection Time: 06/22/18  2:14 PM  Result Value Ref Range Status   Specimen Description BLOOD LEFT HAND  Final   Special Requests   Final    BOTTLES DRAWN AEROBIC ONLY Blood Culture results may not be optimal due to an inadequate volume of blood received in culture bottles   Culture   Final    NO GROWTH 3 DAYS Performed at [facility], [address] [place], [place] [address]    Report Status PENDING  Incomplete    Assessment & Plan:  Patient was evaluated and treated and all questions answered.  Left Great Toe Infection, s/p Partial Amputation -Surgical site healing well. No purulence. Sanguinous drainage only. No signs of acute infection. -Cultures pending - Group G strep isolated. ID following. -Path confirmed OM with viable margin.. -WBC downtrending believed to be from perio-operative pneumonia. -Continue empiric abx. -Wound healing well no signs of continued infection. Please have patient follow up in the office next Thursday  L Leg Edema -DVT negative  Podiatry to sign off

## 2018-06-25 NOTE — Progress Notes (Signed)
Dressing changed on pts left foot, pt tolerated well site dry and intact. Edges approximated. Placed gauze, and kerlix and applied ACE wrap up to knee.  Also wrapped pts right leg with ACE wrap up to knee to help swelling per MD.

## 2018-06-25 NOTE — Progress Notes (Addendum)
PROGRESS NOTE  KHALIQ TURAY FSE:[id number] DOB: 1945-08-17 DOA: 06/17/2018 PCP: [name]  HPI/Brief Narrative  [name] is a 72 y.o. year old male with medical history significant for DM type II, HTN, COPD,PAFon xarelto, chronic venous insufficiency , and previous amputation of the right great toe who presented on 06/17/2018 with worsening pain and redness of left great toe and was found to have left toe osteomyelitis.  Course complicated by recent diagnosis of aspiration pneumonitis with left lower lobe pneumonia.  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 CXR consistent with pulmonary edema and patient with dyspnea on exertion, crackles on exam and worsening peripheral edema. Unfortunately, did not get IV lasix in the evening despite orders so will increase to IV lasix 80 mg today given significant dyspnea with minimal exertion. Continue duo-nebs.    Continue to treat pneumonia as above.  #Left lower lobe pneumonia, improving.   We will continue oral Augmentin and azithromycin    #Diabetic L foot ulcer/osteomyelitis of great toe.  Status post surgical cure via podiatry intervention.  Given persistent leukocytosis during hospital course ID recommended further therapy with Augmentin for total 7 days.  We will continue such.Continue dressing changes  Leukocytosis, improving.  Peak WBC of 20.7.  Continues to downtrend  Patient has remained afebrile.  Treated as above  #AKI on CKD stage III. Peak creatine of 2.1 during hospitalization, back at baseline (1.3-1.4). monitor while on lasix.  Avoid nephrotoxins.  #Acute blood loss anemia, stable.  Related to recent surgery during hospitalization.  Hemoglobin on  presentation 9.7, stable on monitoring, no gross signs or symptoms of bleeding.  #Atrial fibrillation, rate controlled.  Continue home amiodarone and Cardizem.  Tolerating home Xarelto.  #Type 2 diabetes.  A1c 6.4.  Continue sliding scale coverage  #OSA, stable. Continue CPAP  #COPD. No overt wheezing but with increased oxygen requirement and vascular congestion will likely need bronchodilation to assist in treatment of CHF flare. Holding home inhalers, scheduled duo-nebs.  Code Status: Full code  Family Communication: No family at bedside  Disposition Plan: will need to continue IV lasix given significant dyspnea and volume overload with oxygen requirement.   Consultants:  Podiatry, infectious disease      Antimicrobials: Anti-infectives (From admission, onward)   Start     Dose/Rate Route Frequency Ordered Stop   06/24/18 0000  amoxicillin-clavulanate (AUGMENTIN) 875-125 MG tablet     1 tablet Oral Every 12 hours 06/24/18 0829 06/29/18 2359   06/24/18 0000  azithromycin (ZITHROMAX) 250 MG tablet     250 mg Oral Daily 06/24/18 0829 06/28/18 2359   06/23/18 2200  amoxicillin-clavulanate (AUGMENTIN) 875-125 MG per tablet 1 tablet     1 tablet Oral Every 12 hours 06/23/18 1546 06/28/18 2159   06/23/18 1600  azithromycin (ZITHROMAX) tablet 250 mg     250 mg Oral Daily 06/23/18 1546 06/27/18 0959   06/22/18 2000  Ampicillin-Sulbactam (UNASYN) 3 g in sodium chloride 0.9 % 100 mL IVPB  Status:  Discontinued     3 g 200 mL/hr over 30 Minutes Intravenous Every 6 hours 06/22/18 1919 06/23/18 1544   06/22/18 1830  cefTRIAXone (ROCEPHIN) 2 g in sodium chloride 0.9 % 100 mL IVPB  Status:  Discontinued     2 g 200 mL/hr over  30 Minutes Intravenous Every 24 hours 06/22/18 1811 06/22/18 1817   06/22/18 1830  azithromycin (ZITHROMAX) 500 mg in sodium chloride 0.9 % 250 mL IVPB  Status:  Discontinued     500 mg 250 mL/hr over 60 Minutes Intravenous Every 24 hours 06/22/18 1811 06/23/18 1544     06/21/18 2200  amoxicillin-clavulanate (AUGMENTIN) 875-125 MG per tablet 1 tablet  Status:  Discontinued     1 tablet Oral Every 12 hours 06/21/18 1239 06/22/18 1811   06/18/18 2200  vancomycin (VANCOCIN) 1,750 mg in sodium chloride 0.9 % 500 mL IVPB  Status:  Discontinued     1,750 mg 250 mL/hr over 120 Minutes Intravenous Every 24 hours 06/18/18 0127 06/21/18 1100   06/18/18 1413  vancomycin (VANCOCIN) powder  Status:  Discontinued       As needed 06/18/18 1413 06/18/18 1422   06/18/18 1000  ceFEPIme (MAXIPIME) 2 g in sodium chloride 0.9 % 100 mL IVPB  Status:  Discontinued     2 g 200 mL/hr over 30 Minutes Intravenous Every 12 hours 06/18/18 0127 06/21/18 1232   06/18/18 0000  vancomycin (VANCOCIN) 2,000 mg in sodium chloride 0.9 % 500 mL IVPB     2,000 mg 250 mL/hr over 120 Minutes Intravenous  Once 06/17/18 2350 06/18/18 0326   06/18/18 0000  ceFEPIme (MAXIPIME) 2 g in sodium chloride 0.9 % 100 mL IVPB  Status:  Discontinued     2 g 200 mL/hr over 30 Minutes Intravenous Every 8 hours 06/17/18 2350 06/18/18 0127         DVT prophylaxis: Xarelto   Objective: Vitals:   06/24/18 1506 06/24/18 1930 06/24/18 2110 06/25/18 0415  BP: (!) 149/58 (!) 158/53  (!) 160/56  Pulse: 84 75  70  Resp: 20 14    Temp: 98 F (36.7 C) 98.8 F (37.1 C)  98 F (36.7 C)  TempSrc: Oral Oral  Oral  SpO2: 100% 96% 96% 96%  Weight:      Height:        Intake/Output Summary (Last 24 hours) at 06/25/2018 1030 Last data filed at 06/25/2018 0900 Gross per 24 hour  Intake 720 ml  Output 1450 ml  Net -730 ml   Filed Weights   06/17/18 2018 06/18/18 1326  Weight: (!) 151 kg (!) 151 kg    Exam:  Constitutional: Obese male, no distress Eyes: EOMI, anicteric, normal conjunctivae ENMT: Oropharynx with moist mucous membranes, normal dentition Cardiovascular: RRR no MRGs, 2+ pitting edema of bilateral lower legs to below knees Respiratory: non labored breathing but on 2 L O2 and crackles on  exam ( seems slightly improved from prior), no accessory muscle use Abdomen: Soft,non-tender,  Skin: Dressing in place on left foot Neurologic: Grossly no focal neuro deficit. Psychiatric:Appropriate affect, and mood. Mental status AAOx3  Data Reviewed: CBC: Recent Labs  Lab 06/19/18 0534  06/21/18 0912 06/22/18 0316 06/23/18 0233 06/24/18 0139 06/25/18 0829  WBC 18.9*   < > 27.6* 28.7* 26.5* 21.6* 18.2*  NEUTROABS 14.2*  --   --   --   --   --   --   HGB 9.0*   < > 8.3* 8.1* 8.1* 7.6* 9.3*  HCT 31.0*   < > 27.7* 28.0* 26.8* 25.4* 31.7*  MCV 91.2   < > 89.6 90.0 89.3 88.8 89.8  PLT 185   < > 231 221 208 233 266   < > = values in this interval not displayed.   Basic Metabolic Panel:  Recent Labs  Lab 06/19/18 0534 06/20/18 0213 06/21/18 0912 06/23/18 0233 06/25/18 0829  NA 140 134* 136 138 140  K 5.0 4.4 4.3 4.3 4.1  CL 109 104 107 109 107  CO2 23 20* 20* 21* 22  GLUCOSE 140* 171* 165* 158* 166*  BUN 34* 27* 18 12 12   CREATININE 1.72* 1.54* 1.52* 1.37* 1.28*  CALCIUM 8.8* 8.7* 8.7* 8.4* 8.8*   GFR: Estimated Creatinine Clearance: 85 mL/min (A) (by C-G formula based on SCr of 1.28 mg/dL (H)). Liver Function Tests: No results for input(s): AST, ALT, ALKPHOS, BILITOT, PROT, ALBUMIN in the last 168 hours. No results for input(s): LIPASE, AMYLASE in the last 168 hours. No results for input(s): AMMONIA in the last 168 hours. Coagulation Profile: No results for input(s): INR, PROTIME in the last 168 hours. Cardiac Enzymes: No results for input(s): CKTOTAL, CKMB, CKMBINDEX, TROPONINI in the last 168 hours. BNP (last 3 results) No results for input(s): PROBNP in the last 8760 hours. HbA1C: No results for input(s): HGBA1C in the last 72 hours. CBG: Recent Labs  Lab 06/24/18 0656 06/24/18 1134 06/24/18 1653 06/24/18 2042 06/25/18 0605  GLUCAP 150* 149* 130* 153* 134*   Lipid Profile: No results for input(s): CHOL, HDL, LDLCALC, TRIG, CHOLHDL, LDLDIRECT in the last  72 hours. Thyroid Function Tests: No results for input(s): TSH, T4TOTAL, FREET4, T3FREE, THYROIDAB in the last 72 hours. Anemia Panel: No results for input(s): VITAMINB12, FOLATE, FERRITIN, TIBC, IRON, RETICCTPCT in the last 72 hours. Urine analysis:    Component Value Date/Time   COLORURINE YELLOW 06/20/2018 0409   APPEARANCEUR CLEAR 06/20/2018 0409   LABSPEC 1.014 06/20/2018 0409   PHURINE 5.0 06/20/2018 0409   GLUCOSEU NEGATIVE 06/20/2018 0409   HGBUR NEGATIVE 06/20/2018 0409   BILIRUBINUR NEGATIVE 06/20/2018 0409   KETONESUR NEGATIVE 06/20/2018 0409   PROTEINUR NEGATIVE 06/20/2018 0409   NITRITE NEGATIVE 06/20/2018 0409   LEUKOCYTESUR NEGATIVE 06/20/2018 0409   Sepsis Labs: @LABRCNTIP (procalcitonin:4,lacticidven:4)  ) Recent Results (from the past 240 hour(s))  Blood Cultures x 2 sites     Status: None   Collection Time: 06/17/18 11:35 PM  Result Value Ref Range Status   Specimen Description BLOOD LEFT FOREARM  Final   Special Requests   Final    BOTTLES DRAWN AEROBIC AND ANAEROBIC Blood Culture adequate volume   Culture   Final    NO GROWTH 5 DAYS Performed at [facility], [address] [place], [place] [address]    Report Status 06/23/2018 FINAL  Final  Blood Cultures x 2 sites     Status: None   Collection Time: 06/17/18 11:47 PM  Result Value Ref Range Status   Specimen Description BLOOD RIGHT WRIST  Final   Special Requests   Final    BOTTLES DRAWN AEROBIC ONLY Blood Culture adequate volume   Culture   Final    NO GROWTH 5 DAYS Performed at [facility], [address] [place], [place] [address]    Report Status 06/23/2018 FINAL  Final  Surgical pcr screen     Status: None   Collection Time: 06/18/18  7:09 AM  Result Value Ref Range Status   MRSA, PCR NEGATIVE NEGATIVE Final   Staphylococcus aureus NEGATIVE NEGATIVE Final    Comment: (NOTE) The Xpert SA Assay (FDA approved for NASAL specimens in patients 55 years of age and older), is one  component of a comprehensive surveillance program. It is not intended to diagnose infection nor to guide or  monitor treatment. Performed at [facility], [address], [place], [place] [address]   Culture, fungus without smear     Status: None (Preliminary result)   Collection Time: 06/18/18  1:53 PM  Result Value Ref Range Status   Specimen Description TOE LEFT  Final   Special Requests NONE  Final   Culture   Final    NO FUNGUS ISOLATED AFTER 3 DAYS Performed at [facility], [address], [place], [place] [address]    Report Status PENDING  Incomplete  Aerobic/Anaerobic Culture (surgical/deep wound)     Status: None   Collection Time: 06/18/18  1:53 PM  Result Value Ref Range Status   Specimen Description TOE LEFT  Final   Special Requests NONE  Final   Gram Stain NO WBC SEEN NO ORGANISMS SEEN   Final   Culture   Final    RARE STREPTOCOCCUS GROUP G NO ANAEROBES ISOLATED Performed at [facility], [address], [place], [place] [address]    Report Status 06/23/2018 FINAL  Final  Culture, blood (Routine X 2) w Reflex to ID Panel     Status: None (Preliminary result)   Collection Time: 06/22/18  2:14 PM  Result Value Ref Range Status   Specimen Description BLOOD LEFT HAND  Final   Special Requests   Final    BOTTLES DRAWN AEROBIC ONLY Blood Culture results may not be optimal due to an inadequate volume of blood received in culture bottles   Culture   Final    NO GROWTH 3 DAYS Performed at [facility], [address], [place], [place] [address]    Report Status PENDING  Incomplete      Studies: Dg Chest Port 1 View  Result Date: 06/24/2018 CLINICAL DATA:  Pneumonia EXAM: PORTABLE CHEST 1 VIEW COMPARISON:  06/22/2018, 10/06/2016 FINDINGS: Increased small bilateral effusions. Worsening airspace disease at the left base. Cardiomegaly with vascular congestion and diffuse interstitial opacity consistent with edema. IMPRESSION: 1. Increased  small left greater than right pleural effusions with worsening airspace disease at the left base which may reflect atelectasis or pneumonia 2. Cardiomegaly with vascular congestion and diffuse interstitial opacity suspect for edema Electronically Signed   By: Jasmine Pang M.D.   On: 06/24/2018 22:46    Scheduled Meds: . amiodarone  200 mg Oral Daily  . amoxicillin-clavulanate  1 tablet Oral Q12H  . azithromycin  250 mg Oral Daily  . bisacodyl  10 mg Rectal Once  . diltiazem  180 mg Oral Daily  . feeding supplement (ENSURE ENLIVE)  237 mL Oral BID BM  . folic acid  1 mg Oral Daily  . furosemide  60 mg Intravenous Once  . furosemide  80 mg Intravenous Once  . guaiFENesin  600 mg Oral BID  . insulin aspart  0-9 Units Subcutaneous TID WC  . ipratropium-albuterol  3 mL Nebulization TID  . multivitamin with minerals  1 tablet Oral Daily  . polyethylene glycol  17 g Oral Daily  . rivaroxaban  20 mg Oral Daily  . rosuvastatin  5 mg Oral q1800  . thiamine  100 mg Oral Daily   Or  . thiamine  100 mg Intravenous Daily    Continuous Infusions: . lactated ringers 10 mL/hr at 06/18/18 1325     LOS: 8 days     Laverna Peace, MD [facility] Pager [phone]  If 7PM-7AM, please contact night-coverage [email or web address] Password [email or web address] 06/25/2018, 10:30 AM

## 2018-06-26 DIAGNOSIS — Z9889 Other specified postprocedural states: Secondary | ICD-10-CM

## 2018-06-26 LAB — CBC
HCT: 27.9 % — ABNORMAL LOW (ref 39.0–52.0)
Hemoglobin: 8.3 g/dL — ABNORMAL LOW (ref 13.0–17.0)
MCH: 26.4 pg (ref 26.0–34.0)
MCHC: 29.7 g/dL — ABNORMAL LOW (ref 30.0–36.0)
MCV: 88.9 fL (ref 80.0–100.0)
PLATELETS: 280 10*3/uL (ref 150–400)
RBC: 3.14 MIL/uL — ABNORMAL LOW (ref 4.22–5.81)
RDW: 17.1 % — ABNORMAL HIGH (ref 11.5–15.5)
WBC: 17.2 10*3/uL — ABNORMAL HIGH (ref 4.0–10.5)
nRBC: 0.6 % — ABNORMAL HIGH (ref 0.0–0.2)

## 2018-06-26 LAB — BASIC METABOLIC PANEL
Anion gap: 11 (ref 5–15)
BUN: 15 mg/dL (ref 8–23)
CO2: 27 mmol/L (ref 22–32)
Calcium: 8.5 mg/dL — ABNORMAL LOW (ref 8.9–10.3)
Chloride: 101 mmol/L (ref 98–111)
Creatinine, Ser: 1.21 mg/dL (ref 0.61–1.24)
GFR calc Af Amer: >60 mL/min (ref >60)
GFR calc non Af Amer: 59 mL/min — ABNORMAL LOW (ref >60)
GLUCOSE: 176 mg/dL — AB (ref 70–99)
Potassium: 3.5 mmol/L (ref 3.5–5.1)
Sodium: 139 mmol/L (ref 135–145)

## 2018-06-26 LAB — GLUCOSE, CAPILLARY
GLUCOSE-CAPILLARY: 156 mg/dL — AB (ref 70–99)
Glucose-Capillary: 172 mg/dL — ABNORMAL HIGH (ref 70–99)
Glucose-Capillary: 160 mg/dL — ABNORMAL HIGH (ref 70–99)
Glucose-Capillary: 165 mg/dL — ABNORMAL HIGH (ref 70–99)

## 2018-06-26 MED ORDER — UMECLIDINIUM-VILANTEROL 62.5-25 MCG/INH IN AEPB
1.0000 | INHALATION_SPRAY | Freq: Every day | RESPIRATORY_TRACT | Status: DC
  Administered 2018-06-26 – 2018-06-27 (×2): 1 via RESPIRATORY_TRACT
  Filled 2018-06-26: qty 14

## 2018-06-26 MED ORDER — FUROSEMIDE 40 MG PO TABS
40.0000 mg | ORAL_TABLET | Freq: Two times a day (BID) | ORAL | Status: DC
  Administered 2018-06-26 – 2018-06-27 (×2): 40 mg via ORAL
  Filled 2018-06-26 (×2): qty 1

## 2018-06-26 MED ORDER — FUROSEMIDE 40 MG PO TABS
40.0000 mg | ORAL_TABLET | Freq: Every day | ORAL | Status: DC
  Administered 2018-06-26: 40 mg via ORAL
  Filled 2018-06-26: qty 1

## 2018-06-26 MED ORDER — IPRATROPIUM-ALBUTEROL 0.5-2.5 (3) MG/3ML IN SOLN: 3.0000 mL | Freq: Four times a day (QID) | RESPIRATORY_TRACT | Status: DC | PRN

## 2018-06-26 NOTE — Plan of Care (Signed)

## 2018-06-26 NOTE — Plan of Care (Signed)
  Problem: Health Behavior/Discharge Planning: Goal: Ability to manage health-related needs will improve Outcome: Progressing   Problem: Clinical Measurements: Goal: Will remain free from infection Outcome: Progressing   Problem: Coping: Goal: Level of anxiety will decrease Outcome: Progressing   Problem: Elimination: Goal: Will not experience complications related to bowel motility Outcome: Progressing   Problem: Elimination: Goal: Will not experience complications related to urinary retention Outcome: Progressing

## 2018-06-26 NOTE — Progress Notes (Addendum)
PROGRESS NOTE  [name] PJK:[id number] DOB: 1946-05-13 DOA: 06/17/2018 PCP: [name]  HPI/Brief Narrative  [name] is a 72 y.o. year old male with medical history significant for DM type II, HTN, COPD,PAFon xarelto, chronic venous insufficiency , and previous amputation of the right great toe who presented on 06/17/2018 with worsening pain and redness of left great toe and was found to have left toe osteomyelitis.  Course complicated by recent diagnosis of aspiration pneumonitis with left lower lobe pneumonia.  Patient was also found to have exacerbation of diastolic CHF on 12/12 prior to planned discharge in setting of holding home Lasix during hospital stay.  Patient responded quickly to IV Lasix therapy and is now transitioning to oral Lasix on 12/14.  Subjective ADDENDUM: feeling much better. No longer on oxygen. Feels breathing has improved on IV lasix.   Assessment/Plan: #Acute hypoxic respiratory failure secondary to acute on chronic CHFpEF exacerbation (Ef 60-65%, grade 2 diastolic dysfunction, 08/2016), much improved. Not requiring oxygen.  Timed IV Lasix 60 mEq x 2 last 24 hours.  Will transition to oral today give 40 this a.m. an additional 40 this p.m. monitor I's and O's, daily weights.  If clinically remains improved on 12/15 anticipate discharge.  Needs ambulatory oxygen test to assure no oxygen need with exertion  #Left lower lobe pneumonia, improving.   We will continue oral Augmentin and azithromycin    #Diabetic L foot ulcer/osteomyelitis of great toe.  Status post surgical cure via podiatry intervention.  Given persistent leukocytosis during hospital course ID recommended further therapy with Augmentin for total 7 days.  We will continue such.Continue dressing changes  #Leukocytosis, improving.  Peak WBC of 20.7.  Continues to downtrend  Patient has remained afebrile.  Treated as above  #AKI on CKD stage III. Peak creatine of 2.1  during hospitalization, back at baseline (1.3-1.4). monitor while on lasix.  Avoid nephrotoxins.  #Acute blood loss anemia, stable.  Related to recent surgery during hospitalization.  Hemoglobin on presentation 9.7, stable on monitoring, no gross signs or symptoms of bleeding.  #Atrial fibrillation, rate controlled.  Continue home amiodarone and Cardizem.  Tolerating home Xarelto.  #Type 2 diabetes.  A1c 6.4.  Continue sliding scale coverage  #OSA, stable. Continue CPAP  #COPD. No overt wheezing, no longer with increased oxygen requirements now on room air.  Pending ambulatory oxygen test.  De-escalate to PRN duo nebs  Code Status: Full code  Family Communication: No family at bedside  Disposition Plan: Monitor over next 24 hours while oral Lasix, dissipate discharge next 24 hours if clinically remains improved  Consultants:  Podiatry, infectious disease      Antimicrobials: Anti-infectives (From admission, onward)   Start     Dose/Rate Route Frequency Ordered Stop   06/24/18 0000  amoxicillin-clavulanate (AUGMENTIN) 875-125 MG tablet     1 tablet Oral Every 12 hours 06/24/18 0829 06/29/18 2359   06/24/18 0000  azithromycin (ZITHROMAX) 250 MG tablet     250 mg Oral Daily 06/24/18 0829 06/28/18 2359   06/23/18 2200  amoxicillin-clavulanate (AUGMENTIN) 875-125 MG per tablet 1 tablet     1 tablet Oral Every 12 hours 06/23/18 1546 06/28/18 2159   06/23/18 1600  azithromycin (ZITHROMAX) tablet 250 mg     250 mg Oral Daily 06/23/18 1546 06/26/18 0903   06/22/18 2000  Ampicillin-Sulbactam (UNASYN) 3 g in sodium chloride 0.9 % 100 mL IVPB  Status:  Discontinued     3 g 200  mL/hr over 30 Minutes Intravenous Every 6 hours 06/22/18 1919 06/23/18 1544   06/22/18 1830  cefTRIAXone (ROCEPHIN) 2 g in sodium chloride 0.9 % 100 mL IVPB  Status:  Discontinued     2 g 200 mL/hr over 30 Minutes Intravenous Every 24 hours 06/22/18 1811 06/22/18 1817   06/22/18 1830  azithromycin (ZITHROMAX) 500  mg in sodium chloride 0.9 % 250 mL IVPB  Status:  Discontinued     500 mg 250 mL/hr over 60 Minutes Intravenous Every 24 hours 06/22/18 1811 06/23/18 1544   06/21/18 2200  amoxicillin-clavulanate (AUGMENTIN) 875-125 MG per tablet 1 tablet  Status:  Discontinued     1 tablet Oral Every 12 hours 06/21/18 1239 06/22/18 1811   06/18/18 2200  vancomycin (VANCOCIN) 1,750 mg in sodium chloride 0.9 % 500 mL IVPB  Status:  Discontinued     1,750 mg 250 mL/hr over 120 Minutes Intravenous Every 24 hours 06/18/18 0127 06/21/18 1100   06/18/18 1413  vancomycin (VANCOCIN) powder  Status:  Discontinued       As needed 06/18/18 1413 06/18/18 1422   06/18/18 1000  ceFEPIme (MAXIPIME) 2 g in sodium chloride 0.9 % 100 mL IVPB  Status:  Discontinued     2 g 200 mL/hr over 30 Minutes Intravenous Every 12 hours 06/18/18 0127 06/21/18 1232   06/18/18 0000  vancomycin (VANCOCIN) 2,000 mg in sodium chloride 0.9 % 500 mL IVPB     2,000 mg 250 mL/hr over 120 Minutes Intravenous  Once 06/17/18 2350 06/18/18 0326   06/18/18 0000  ceFEPIme (MAXIPIME) 2 g in sodium chloride 0.9 % 100 mL IVPB  Status:  Discontinued     2 g 200 mL/hr over 30 Minutes Intravenous Every 8 hours 06/17/18 2350 06/18/18 0127         DVT prophylaxis: Xarelto   Objective: Vitals:   06/26/18 0508 06/26/18 0902 06/26/18 0908 06/26/18 1536  BP: (!) 139/51 (!) 124/52    Pulse: 81 81 83   Resp:  18 18   Temp: 97.9 F (36.6 C) 98 F (36.7 C)    TempSrc: Oral Oral    SpO2: 97% 100% 97% 98%  Weight:      Height:        Intake/Output Summary (Last 24 hours) at 06/26/2018 1559 Last data filed at 06/26/2018 1550 Gross per 24 hour  Intake 764.17 ml  Output 3175 ml  Net -2410.83 ml   Filed Weights   06/17/18 2018 06/18/18 1326 06/25/18 1944  Weight: (!) 151 kg (!) 151 kg (!) 156.1 kg    Exam:  Constitutional: Obese male, no distress Eyes: EOMI, anicteric, normal conjunctivae ENMT: Oropharynx with moist mucous membranes, normal  dentition Cardiovascular: RRR no MRGs, 2+ pitting edema of bilateral lower legs to below knees, improving prior exam, dressing in place Respiratory: non labored breathing on room air, minimal crackles at bases, no accessory muscle use abdomen: Soft,non-tender,  Skin: Dressing in place on left foot Neurologic: Grossly no focal neuro deficit. Psychiatric:Appropriate affect, and mood. Mental status AAOx3  Data Reviewed: CBC: Recent Labs  Lab 06/22/18 0316 06/23/18 0233 06/24/18 0139 06/25/18 0829 06/26/18 0257  WBC 28.7* 26.5* 21.6* 18.2* 17.2*  HGB 8.1* 8.1* 7.6* 9.3* 8.3*  HCT 28.0* 26.8* 25.4* 31.7* 27.9*  MCV 90.0 89.3 88.8 89.8 88.9  PLT 221 208 233 266 280   Basic Metabolic Panel: Recent Labs  Lab 06/20/18 0213 06/21/18 0912 06/23/18 0233 06/25/18 0829 06/26/18 0257  NA 134* 136 138 140  139  K 4.4 4.3 4.3 4.1 3.5  CL 104 107 109 107 101  CO2 20* 20* 21* 22 27  GLUCOSE 171* 165* 158* 166* 176*  BUN 27* 18 12 12 15   CREATININE 1.54* 1.52* 1.37* 1.28* 1.21  CALCIUM 8.7* 8.7* 8.4* 8.8* 8.5*   GFR: Estimated Creatinine Clearance: 91.6 mL/min (by C-G formula based on SCr of 1.21 mg/dL). Liver Function Tests: No results for input(s): AST, ALT, ALKPHOS, BILITOT, PROT, ALBUMIN in the last 168 hours. No results for input(s): LIPASE, AMYLASE in the last 168 hours. No results for input(s): AMMONIA in the last 168 hours. Coagulation Profile: No results for input(s): INR, PROTIME in the last 168 hours. Cardiac Enzymes: No results for input(s): CKTOTAL, CKMB, CKMBINDEX, TROPONINI in the last 168 hours. BNP (last 3 results) No results for input(s): PROBNP in the last 8760 hours. HbA1C: No results for input(s): HGBA1C in the last 72 hours. CBG: Recent Labs  Lab 06/25/18 1117 06/25/18 1644 06/25/18 2126 06/26/18 0659 06/26/18 1129  GLUCAP 179* 146* 150* 172* 160*   Lipid Profile: No results for input(s): CHOL, HDL, LDLCALC, TRIG, CHOLHDL, LDLDIRECT in the last 72  hours. Thyroid Function Tests: No results for input(s): TSH, T4TOTAL, FREET4, T3FREE, THYROIDAB in the last 72 hours. Anemia Panel: No results for input(s): VITAMINB12, FOLATE, FERRITIN, TIBC, IRON, RETICCTPCT in the last 72 hours. Urine analysis:    Component Value Date/Time   COLORURINE YELLOW 06/20/2018 0409   APPEARANCEUR CLEAR 06/20/2018 0409   LABSPEC 1.014 06/20/2018 0409   PHURINE 5.0 06/20/2018 0409   GLUCOSEU NEGATIVE 06/20/2018 0409   HGBUR NEGATIVE 06/20/2018 0409   BILIRUBINUR NEGATIVE 06/20/2018 0409   KETONESUR NEGATIVE 06/20/2018 0409   PROTEINUR NEGATIVE 06/20/2018 0409   NITRITE NEGATIVE 06/20/2018 0409   LEUKOCYTESUR NEGATIVE 06/20/2018 0409   Sepsis Labs: @LABRCNTIP (procalcitonin:4,lacticidven:4)  ) Recent Results (from the past 240 hour(s))  Blood Cultures x 2 sites     Status: None   Collection Time: 06/17/18 11:35 PM  Result Value Ref Range Status   Specimen Description BLOOD LEFT FOREARM  Final   Special Requests   Final    BOTTLES DRAWN AEROBIC AND ANAEROBIC Blood Culture adequate volume   Culture   Final    NO GROWTH 5 DAYS Performed at [facility], [address] [place], [address] [place]    Report Status 06/23/2018 FINAL  Final  Blood Cultures x 2 sites     Status: None   Collection Time: 06/17/18 11:47 PM  Result Value Ref Range Status   Specimen Description BLOOD RIGHT WRIST  Final   Special Requests   Final    BOTTLES DRAWN AEROBIC ONLY Blood Culture adequate volume   Culture   Final    NO GROWTH 5 DAYS Performed at [facility], [address], [place], [address] [place]    Report Status 06/23/2018 FINAL  Final  Surgical pcr screen     Status: None   Collection Time: 06/18/18  7:09 AM  Result Value Ref Range Status   MRSA, PCR NEGATIVE NEGATIVE Final   Staphylococcus aureus NEGATIVE NEGATIVE Final    Comment: (NOTE) The Xpert SA Assay (FDA approved for NASAL specimens in patients 64 years of age and older), is one  component of a comprehensive surveillance program. It is not intended to diagnose infection nor to guide or monitor treatment. Performed at [facility], [address] [place], [address] [place]   Culture, fungus without smear  Status: None (Preliminary result)   Collection Time: 06/18/18  1:53 PM  Result Value Ref Range Status   Specimen Description TOE LEFT  Final   Special Requests NONE  Final   Culture   Final    NO FUNGUS ISOLATED AFTER 7 DAYS Performed at [facility], [address] [place], [place] [address]    Report Status PENDING  Incomplete  Aerobic/Anaerobic Culture (surgical/deep wound)     Status: None   Collection Time: 06/18/18  1:53 PM  Result Value Ref Range Status   Specimen Description TOE LEFT  Final   Special Requests NONE  Final   Gram Stain NO WBC SEEN NO ORGANISMS SEEN   Final   Culture   Final    RARE STREPTOCOCCUS GROUP G NO ANAEROBES ISOLATED Performed at [facility], [address], [place], [place] [address]    Report Status 06/23/2018 FINAL  Final  Culture, blood (Routine X 2) w Reflex to ID Panel     Status: None (Preliminary result)   Collection Time: 06/22/18  2:14 PM  Result Value Ref Range Status   Specimen Description BLOOD LEFT HAND  Final   Special Requests   Final    BOTTLES DRAWN AEROBIC ONLY Blood Culture results may not be optimal due to an inadequate volume of blood received in culture bottles   Culture   Final    NO GROWTH 4 DAYS Performed at [facility], [address], [place], [place] [address]    Report Status PENDING  Incomplete      Studies: No results found.  Scheduled Meds: . amiodarone  200 mg Oral Daily  . amoxicillin-clavulanate  1 tablet Oral Q12H  . bisacodyl  10 mg Rectal Once  . diltiazem  180 mg Oral Daily  . feeding supplement (ENSURE ENLIVE)  237 mL Oral BID BM  . folic acid  1 mg Oral Daily  . furosemide  60 mg Intravenous Once  . furosemide  40 mg Oral Daily  .  guaiFENesin  600 mg Oral BID  . insulin aspart  0-9 Units Subcutaneous TID WC  . ipratropium-albuterol  3 mL Nebulization TID  . multivitamin with minerals  1 tablet Oral Daily  . polyethylene glycol  17 g Oral Daily  . rivaroxaban  20 mg Oral Daily  . rosuvastatin  5 mg Oral q1800  . thiamine  100 mg Oral Daily   Or  . thiamine  100 mg Intravenous Daily    Continuous Infusions: . lactated ringers 10 mL/hr at 06/18/18 1325     LOS: 9 days     Laverna Peace, MD [facility] Pager [phone]  If 7PM-7AM, please contact night-coverage [email or web address] Password [email or web address] 06/26/2018, 3:59 PM

## 2018-06-27 DIAGNOSIS — R0600 Dyspnea, unspecified: Secondary | ICD-10-CM

## 2018-06-27 LAB — BASIC METABOLIC PANEL
Anion gap: 12 (ref 5–15)
BUN: 15 mg/dL (ref 8–23)
CALCIUM: 8.5 mg/dL — AB (ref 8.9–10.3)
CO2: 27 mmol/L (ref 22–32)
Chloride: 100 mmol/L (ref 98–111)
Creatinine, Ser: 1.26 mg/dL — ABNORMAL HIGH (ref 0.61–1.24)
GFR calc Af Amer: >60 mL/min (ref >60)
GFR, EST NON AFRICAN AMERICAN: 57 mL/min — AB (ref >60)
Glucose, Bld: 156 mg/dL — ABNORMAL HIGH (ref 70–99)
Potassium: 3.4 mmol/L — ABNORMAL LOW (ref 3.5–5.1)
Sodium: 139 mmol/L (ref 135–145)

## 2018-06-27 LAB — CBC
HCT: 27.8 % — ABNORMAL LOW (ref 39.0–52.0)
Hemoglobin: 8.1 g/dL — ABNORMAL LOW (ref 13.0–17.0)
MCH: 26 pg (ref 26.0–34.0)
MCHC: 29.1 g/dL — ABNORMAL LOW (ref 30.0–36.0)
MCV: 89.4 fL (ref 80.0–100.0)
PLATELETS: 306 10*3/uL (ref 150–400)
RBC: 3.11 MIL/uL — ABNORMAL LOW (ref 4.22–5.81)
RDW: 17.1 % — AB (ref 11.5–15.5)
WBC: 15.5 10*3/uL — ABNORMAL HIGH (ref 4.0–10.5)
nRBC: 0.3 % — ABNORMAL HIGH (ref 0.0–0.2)

## 2018-06-27 LAB — GLUCOSE, CAPILLARY
Glucose-Capillary: 156 mg/dL — ABNORMAL HIGH (ref 70–99)
Glucose-Capillary: 158 mg/dL — ABNORMAL HIGH (ref 70–99)

## 2018-06-27 MED ORDER — AMOXICILLIN-POT CLAVULANATE 875-125 MG PO TABS: 1.0000 | ORAL_TABLET | Freq: Two times a day (BID) | ORAL | 0 refills | Status: AC

## 2018-06-27 MED ORDER — POTASSIUM CHLORIDE CRYS ER 20 MEQ PO TBCR
20.0000 meq | EXTENDED_RELEASE_TABLET | Freq: Two times a day (BID) | ORAL | Status: DC
  Administered 2018-06-27: 20 meq via ORAL
  Filled 2018-06-27: qty 1

## 2018-06-27 NOTE — Plan of Care (Signed)

## 2018-06-27 NOTE — Plan of Care (Signed)
  Problem: Education: Goal: Knowledge of General Education information will improve Description Including pain rating scale, medication(s)/side effects and non-pharmacologic comfort measures Outcome: Progressing   Problem: Health Behavior/Discharge Planning: Goal: Ability to manage health-related needs will improve Outcome: Progressing   

## 2018-06-29 LAB — CULTURE, BLOOD (ROUTINE X 2): Culture: NO GROWTH

## 2018-07-01 ENCOUNTER — Encounter: Payer: Self-pay | Admitting: Family Medicine

## 2018-07-01 ENCOUNTER — Ambulatory Visit (INDEPENDENT_AMBULATORY_CARE_PROVIDER_SITE_OTHER): Payer: Medicare Other | Admitting: Podiatry

## 2018-07-01 ENCOUNTER — Encounter: Payer: Self-pay | Admitting: Podiatry

## 2018-07-01 DIAGNOSIS — M86172 Other acute osteomyelitis, left ankle and foot: Secondary | ICD-10-CM

## 2018-07-01 DIAGNOSIS — E08621 Diabetes mellitus due to underlying condition with foot ulcer: Secondary | ICD-10-CM | POA: Diagnosis not present

## 2018-07-01 DIAGNOSIS — L97524 Non-pressure chronic ulcer of other part of left foot with necrosis of bone: Secondary | ICD-10-CM | POA: Diagnosis not present

## 2018-07-01 DIAGNOSIS — Z9889 Other specified postprocedural states: Secondary | ICD-10-CM

## 2018-07-09 LAB — CULTURE, FUNGUS WITHOUT SMEAR

## 2018-07-15 ENCOUNTER — Ambulatory Visit (INDEPENDENT_AMBULATORY_CARE_PROVIDER_SITE_OTHER): Payer: Medicare Other | Admitting: Podiatry

## 2018-07-15 DIAGNOSIS — L97511 Non-pressure chronic ulcer of other part of right foot limited to breakdown of skin: Secondary | ICD-10-CM

## 2018-07-15 DIAGNOSIS — E11621 Type 2 diabetes mellitus with foot ulcer: Secondary | ICD-10-CM

## 2018-07-29 ENCOUNTER — Ambulatory Visit: Payer: Medicare Other | Admitting: Podiatry

## 2018-08-01 NOTE — Progress Notes (Signed)
Subjective:  Patient ID: [name], male    DOB: 1945-10-27,  MRN: [id number]  Chief Complaint  Patient presents with  . Routine Post Op    left great toe amputation - toe looks good - skin on that foot is very dry   DOS: 06/18/18 Procedure: Partial amputation left great toe  73 y.o. male returns for post-op check. History as above. Reports new sore on the right 2nd toe. Not sure how it started.  Review of Systems: Negative except as noted in the HPI. Denies N/V/F/Ch.  Past Medical History:  Diagnosis Date  . Chronic anticoagulation   . Chronic chest pain   . Chronic lower back pain   . Chronic venous insufficiency   . Emphysema   . Headache    "couple times/week" (06/17/2018)  . History of gout   . HTN (hypertension)   . Obesity   . OSA on CPAP   . PAF (paroxysmal atrial fibrillation) (HCC)   . Pneumonia ~ 1981 X 1  . Pneumothorax   . Rheumatoid arthritis (HCC)   . Type II diabetes mellitus (HCC)     Current Outpatient Medications:  .  amiodarone (PACERONE) 200 MG tablet, TAKE 1 TABLET BY MOUTH  DAILY (Patient taking differently: Take 200 mg by mouth daily. ), Disp: 90 tablet, Rfl: 1 .  ANORO ELLIPTA 62.5-25 MCG/INH AEPB, INHALE 1 PUFF INTO THE LUNGS DAILY., Disp: 60 each, Rfl: 5 .  atenolol (TENORMIN) 50 MG tablet, TAKE ONE TABLET BY MOUTH TWICE A DAY (Patient taking differently: Take 50 mg by mouth 2 (two) times daily. ), Disp: 180 tablet, Rfl: 2 .  benazepril-hydrochlorthiazide (LOTENSIN HCT) 10-12.5 MG tablet, HOLD UNTIL SEEN BY YOUR PCP, Disp: , Rfl:  .  benzonatate (TESSALON PERLES) 100 MG capsule, Take 1 capsule (100 mg total) by mouth every 6 (six) hours as needed for cough., Disp: 30 capsule, Rfl: 0 .  diltiazem (CARDIZEM CD) 180 MG 24 hr capsule, TAKE 1 CAPSULE BY MOUTH  DAILY (Patient taking differently: Take 180 mg by mouth daily. ), Disp: 90 capsule, Rfl: 1 .  furosemide (LASIX) 40 MG tablet, Take 40 mg by mouth daily. , Disp: , Rfl:  .  guaiFENesin (MUCINEX)  600 MG 12 hr tablet, Take 1 tablet (600 mg total) by mouth 2 (two) times daily as needed for to loosen phlegm., Disp: 30 tablet, Rfl: 0 .  HYDROcodone-acetaminophen (NORCO) 5-325 MG tablet, Take 1 tablet by mouth 2 (two) times daily as needed for moderate pain., Disp: 20 tablet, Rfl: 0 .  metFORMIN (GLUCOPHAGE-XR) 500 MG 24 hr tablet, Take 2 tablets by mouth 2 (two) times daily. , Disp: , Rfl:  .  omeprazole (PRILOSEC) 20 MG capsule, TAKE 1 CAPSULE BY MOUTH  DAILY (Patient taking differently: Take 20 mg by mouth daily. ), Disp: 90 capsule, Rfl: 1 .  potassium chloride SA (K-DUR,KLOR-CON) 20 MEQ tablet, TAKE ONE TABLET BY MOUTH TWO TIMES A DAY (Patient taking differently: Take 20 mEq by mouth 2 (two) times daily. ), Disp: 180 tablet, Rfl: 3 .  PROAIR HFA 108 (90 Base) MCG/ACT inhaler, INHALE TWO PUFFS BY MOUTH EVERY 4 HOURS AS NEEDED FOR WHEEZING AND  SHORTNESS OF BREATH (Patient taking differently: Inhale 2 puffs into the lungs every 4 (four) hours as needed for shortness of breath. ), Disp: 8.5 each, Rfl: 2 .  rosuvastatin (CRESTOR) 5 MG tablet, Take 5 mg by mouth daily. , Disp: , Rfl:  .  XARELTO 20 MG TABS tablet,  TAKE ONE TABLET BY MOUTH DAILY WITH SUPPER (Patient taking differently: Take 20 mg by mouth daily with supper. ), Disp: 30 tablet, Rfl: 3  Social History   Tobacco Use  Smoking Status Former Smoker  . Packs/day: 2.00  . Years: 30.00  . Pack years: 60.00  . Types: Cigarettes  . Last attempt to quit: 03/14/1996  . Years since quitting: 22.3  Smokeless Tobacco Never Used    No Known Allergies Objective:  There were no vitals filed for this visit. There is no height or weight on file to calculate BMI. Constitutional Well developed. Well nourished.  Vascular Foot warm and well perfused. Capillary refill normal to all digits.   Neurologic Normal speech. Oriented to person, place, and time. Epicritic sensation to light touch grossly present bilaterally.  Dermatologic Skin well  healed left hallux 0.3 cm ulceration R 2nd toe with granular base, no fluctuance no crepitus, no signs of acute infection.  Orthopedic: Tenderness to palpation noted about the surgical site.   Radiographs: None Assessment:   1. Diabetic ulcer of toe of right foot associated with type 2 diabetes mellitus, limited to breakdown of skin (HCC)    Plan:  Patient was evaluated and treated and all questions answered.  S/p foot surgery left -Progressing as expected post-operatively. -XR: None -WB Status: WBAT in surgical shoe. Will plan to transition to normal DM shoes next visit. -Sutures: out. Steri strips applied.. -Medications: none refilled. -Foot redressed.  R 2nd toe ulceration -Dressed with silvadene and DSD -Dress daily with abx cream and band-aid. -Return promptly with signs of worsening.  Return in about 2 weeks (around 07/29/2018) for Wound Care, Bilateral.

## 2018-08-01 NOTE — Progress Notes (Signed)
Subjective:  Patient ID: [name], male    DOB: 21-Feb-1946,  MRN: [id number]  Chief Complaint  Patient presents with  . Routine Post Op    left foot top of great toe amputation; pt stated, "doing alright; no pain today; need a new shoe"    DOS: 06/18/18 Procedure: Partial amputation left great toe  73 y.o. male returns for post-op check. History as above.  Review of Systems: Negative except as noted in the HPI. Denies N/V/F/Ch.  Past Medical History:  Diagnosis Date  . Chronic anticoagulation   . Chronic chest pain   . Chronic lower back pain   . Chronic venous insufficiency   . Emphysema   . Headache    "couple times/week" (06/17/2018)  . History of gout   . HTN (hypertension)   . Obesity   . OSA on CPAP   . PAF (paroxysmal atrial fibrillation) (HCC)   . Pneumonia ~ 1981 X 1  . Pneumothorax   . Rheumatoid arthritis (HCC)   . Type II diabetes mellitus (HCC)     Current Outpatient Medications:  .  amiodarone (PACERONE) 200 MG tablet, TAKE 1 TABLET BY MOUTH  DAILY (Patient taking differently: Take 200 mg by mouth daily. ), Disp: 90 tablet, Rfl: 1 .  ANORO ELLIPTA 62.5-25 MCG/INH AEPB, INHALE 1 PUFF INTO THE LUNGS DAILY., Disp: 60 each, Rfl: 5 .  atenolol (TENORMIN) 50 MG tablet, TAKE ONE TABLET BY MOUTH TWICE A DAY (Patient taking differently: Take 50 mg by mouth 2 (two) times daily. ), Disp: 180 tablet, Rfl: 2 .  benazepril-hydrochlorthiazide (LOTENSIN HCT) 10-12.5 MG tablet, HOLD UNTIL SEEN BY YOUR PCP, Disp: , Rfl:  .  benzonatate (TESSALON PERLES) 100 MG capsule, Take 1 capsule (100 mg total) by mouth every 6 (six) hours as needed for cough., Disp: 30 capsule, Rfl: 0 .  diltiazem (CARDIZEM CD) 180 MG 24 hr capsule, TAKE 1 CAPSULE BY MOUTH  DAILY (Patient taking differently: Take 180 mg by mouth daily. ), Disp: 90 capsule, Rfl: 1 .  furosemide (LASIX) 40 MG tablet, Take 40 mg by mouth daily. , Disp: , Rfl:  .  guaiFENesin (MUCINEX) 600 MG 12 hr tablet, Take 1 tablet  (600 mg total) by mouth 2 (two) times daily as needed for to loosen phlegm., Disp: 30 tablet, Rfl: 0 .  HYDROcodone-acetaminophen (NORCO) 5-325 MG tablet, Take 1 tablet by mouth 2 (two) times daily as needed for moderate pain., Disp: 20 tablet, Rfl: 0 .  metFORMIN (GLUCOPHAGE-XR) 500 MG 24 hr tablet, Take 2 tablets by mouth 2 (two) times daily. , Disp: , Rfl:  .  omeprazole (PRILOSEC) 20 MG capsule, TAKE 1 CAPSULE BY MOUTH  DAILY (Patient taking differently: Take 20 mg by mouth daily. ), Disp: 90 capsule, Rfl: 1 .  potassium chloride SA (K-DUR,KLOR-CON) 20 MEQ tablet, TAKE ONE TABLET BY MOUTH TWO TIMES A DAY (Patient taking differently: Take 20 mEq by mouth 2 (two) times daily. ), Disp: 180 tablet, Rfl: 3 .  PROAIR HFA 108 (90 Base) MCG/ACT inhaler, INHALE TWO PUFFS BY MOUTH EVERY 4 HOURS AS NEEDED FOR WHEEZING AND  SHORTNESS OF BREATH (Patient taking differently: Inhale 2 puffs into the lungs every 4 (four) hours as needed for shortness of breath. ), Disp: 8.5 each, Rfl: 2 .  rosuvastatin (CRESTOR) 5 MG tablet, Take 5 mg by mouth daily. , Disp: , Rfl:  .  XARELTO 20 MG TABS tablet, TAKE ONE TABLET BY MOUTH DAILY WITH SUPPER (Patient taking  differently: Take 20 mg by mouth daily with supper. ), Disp: 30 tablet, Rfl: 3  Social History   Tobacco Use  Smoking Status Former Smoker  . Packs/day: 2.00  . Years: 30.00  . Pack years: 60.00  . Types: Cigarettes  . Last attempt to quit: 03/14/1996  . Years since quitting: 22.3  Smokeless Tobacco Never Used    No Known Allergies Objective:  There were no vitals filed for this visit. There is no height or weight on file to calculate BMI. Constitutional Well developed. Well nourished.  Vascular Foot warm and well perfused. Capillary refill normal to all digits.   Neurologic Normal speech. Oriented to person, place, and time. Epicritic sensation to light touch grossly present bilaterally.  Dermatologic Skin healing well without signs of infection.  Skin edges well coapted without signs of infection.  Orthopedic: Tenderness to palpation noted about the surgical site.   Radiographs: None Assessment:   1. Diabetic ulcer of toe of left foot associated with diabetes mellitus due to underlying condition, with necrosis of bone (HCC)   2. Post-operative state   3. Acute osteomyelitis of toe, left (HCC)    Plan:  Patient was evaluated and treated and all questions answered.  S/p foot surgery left -Progressing as expected post-operatively. -XR: None -WB Status: WBAT in surgical shoe. New shoe dispensed. -Sutures: Intact. -Medications: none refilled. -Foot redressed.  Return in about 2 weeks (around 07/15/2018) for Post-op.

## 2018-08-02 ENCOUNTER — Ambulatory Visit (INDEPENDENT_AMBULATORY_CARE_PROVIDER_SITE_OTHER): Payer: Medicare Other | Admitting: Podiatry

## 2018-08-02 DIAGNOSIS — L97511 Non-pressure chronic ulcer of other part of right foot limited to breakdown of skin: Secondary | ICD-10-CM

## 2018-08-02 DIAGNOSIS — Z9889 Other specified postprocedural states: Secondary | ICD-10-CM

## 2018-08-02 DIAGNOSIS — E11621 Type 2 diabetes mellitus with foot ulcer: Secondary | ICD-10-CM

## 2018-08-12 NOTE — Progress Notes (Signed)
Subjective: 73 year old male presents the office today for follow-up evaluation of the wound of the right second toe.  He states the scab came off when he bumped his toe.  Previously underwent a left hallux amputation.  States overall he is doing well.  He denies any drainage or pus or any increase in swelling or new redness to his foot.  He is been walking the surgical shoe. Denies any systemic complaints such as fevers, chills, nausea, vomiting. No acute changes since last appointment, and no other complaints at this time.   Objective: AAO x3, NAD Neurovascular status unchanged.   Amputation site to the hallux appears to be healing well.  Superficial area of skin breakdown the dorsal second toe on the right foot.  There is no edema, erythema, drainage or pus any clinical signs of infection noted. No edema, erythema, increase in warmth to bilateral lower extremities.  No open lesions or pre-ulcerative lesions.  No pain with calf compression, swelling, warmth, erythema  Assessment: Healing wound right second toe status post left foot surgery  Plan: -All treatment options discussed with the patient including all alternatives, risks, complications.  -Overall surgical site appears to be doing well.  Dr. Kandice Hams notes he can transition to diabetic shoe incision appears to be healing well.  In regards to the right second toe wound this is healing.  Continue to monitor for any signs or symptoms of infection.  I will have him follow-up with Dr. Samuella Cota in 1 week or sooner if needed. -Patient encouraged to call the office with any questions, concerns, change in symptoms.   Vivi Barrack DPM

## 2018-08-13 ENCOUNTER — Ambulatory Visit: Payer: Self-pay | Admitting: Podiatry

## 2018-08-13 ENCOUNTER — Ambulatory Visit (INDEPENDENT_AMBULATORY_CARE_PROVIDER_SITE_OTHER): Payer: Medicare Other | Admitting: Podiatry

## 2018-08-13 ENCOUNTER — Ambulatory Visit (INDEPENDENT_AMBULATORY_CARE_PROVIDER_SITE_OTHER): Payer: Medicare Other

## 2018-08-13 DIAGNOSIS — L03031 Cellulitis of right toe: Secondary | ICD-10-CM

## 2018-08-13 DIAGNOSIS — L02611 Cutaneous abscess of right foot: Secondary | ICD-10-CM

## 2018-08-13 DIAGNOSIS — E11621 Type 2 diabetes mellitus with foot ulcer: Secondary | ICD-10-CM | POA: Insufficient documentation

## 2018-08-13 DIAGNOSIS — L97511 Non-pressure chronic ulcer of other part of right foot limited to breakdown of skin: Secondary | ICD-10-CM

## 2018-08-13 DIAGNOSIS — E1142 Type 2 diabetes mellitus with diabetic polyneuropathy: Secondary | ICD-10-CM

## 2018-08-13 DIAGNOSIS — M8580 Other specified disorders of bone density and structure, unspecified site: Secondary | ICD-10-CM | POA: Diagnosis not present

## 2018-08-13 DIAGNOSIS — L97514 Non-pressure chronic ulcer of other part of right foot with necrosis of bone: Secondary | ICD-10-CM

## 2018-08-13 DIAGNOSIS — M86171 Other acute osteomyelitis, right ankle and foot: Secondary | ICD-10-CM

## 2018-08-13 MED ORDER — DOXYCYCLINE HYCLATE 100 MG PO TABS: 100.0000 mg | ORAL_TABLET | Freq: Two times a day (BID) | ORAL | 0 refills | Status: DC

## 2018-08-13 NOTE — H&P (View-Only) (Signed)
Subjective:  Patient ID: [name], male    DOB: 08-Oct-1945,  MRN: [id number]  Chief Complaint  Patient presents with  . Diabetic Ulcer    bilateral - second right toe looks a little worse   DOS: 06/18/18 Procedure: Partial amputation left great toe  73 y.o. male returns for post-op check. History as above.  Was seen by [name] at last visit and the toe is healing well states that the wound is doing worse since last visit states that he left his stocking on for 3 days and did not check his toe and now the toe is looking red and infected.  Review of Systems: Negative except as noted in the HPI. Denies N/V/F/Ch.  Past Medical History:  Diagnosis Date  . Chronic anticoagulation   . Chronic chest pain   . Chronic lower back pain   . Chronic venous insufficiency   . Emphysema   . Headache    "couple times/week" (06/17/2018)  . History of gout   . HTN (hypertension)   . Obesity   . OSA on CPAP   . PAF (paroxysmal atrial fibrillation) (HCC)   . Pneumonia ~ 1981 X 1  . Pneumothorax   . Rheumatoid arthritis (HCC)   . Type II diabetes mellitus (HCC)     Current Outpatient Medications:  .  amiodarone (PACERONE) 200 MG tablet, TAKE 1 TABLET BY MOUTH  DAILY (Patient taking differently: Take 200 mg by mouth daily. ), Disp: 90 tablet, Rfl: 1 .  ANORO ELLIPTA 62.5-25 MCG/INH AEPB, INHALE 1 PUFF INTO THE LUNGS DAILY., Disp: 60 each, Rfl: 5 .  atenolol (TENORMIN) 50 MG tablet, TAKE ONE TABLET BY MOUTH TWICE A DAY (Patient taking differently: Take 50 mg by mouth 2 (two) times daily. ), Disp: 180 tablet, Rfl: 2 .  benazepril-hydrochlorthiazide (LOTENSIN HCT) 10-12.5 MG tablet, HOLD UNTIL SEEN BY YOUR PCP, Disp: , Rfl:  .  benzonatate (TESSALON PERLES) 100 MG capsule, Take 1 capsule (100 mg total) by mouth every 6 (six) hours as needed for cough., Disp: 30 capsule, Rfl: 0 .  diltiazem (CARDIZEM CD) 180 MG 24 hr capsule, TAKE 1 CAPSULE BY MOUTH  DAILY (Patient taking differently: Take 180 mg  by mouth daily. ), Disp: 90 capsule, Rfl: 1 .  doxycycline (VIBRA-TABS) 100 MG tablet, Take 1 tablet (100 mg total) by mouth 2 (two) times daily., Disp: 20 tablet, Rfl: 0 .  furosemide (LASIX) 40 MG tablet, Take 40 mg by mouth daily. , Disp: , Rfl:  .  guaiFENesin (MUCINEX) 600 MG 12 hr tablet, Take 1 tablet (600 mg total) by mouth 2 (two) times daily as needed for to loosen phlegm., Disp: 30 tablet, Rfl: 0 .  HYDROcodone-acetaminophen (NORCO) 5-325 MG tablet, Take 1 tablet by mouth 2 (two) times daily as needed for moderate pain., Disp: 20 tablet, Rfl: 0 .  metFORMIN (GLUCOPHAGE-XR) 500 MG 24 hr tablet, Take 2 tablets by mouth 2 (two) times daily. , Disp: , Rfl:  .  omeprazole (PRILOSEC) 20 MG capsule, TAKE 1 CAPSULE BY MOUTH  DAILY (Patient taking differently: Take 20 mg by mouth daily. ), Disp: 90 capsule, Rfl: 1 .  potassium chloride SA (K-DUR,KLOR-CON) 20 MEQ tablet, TAKE ONE TABLET BY MOUTH TWO TIMES A DAY (Patient taking differently: Take 20 mEq by mouth 2 (two) times daily. ), Disp: 180 tablet, Rfl: 3 .  PROAIR HFA 108 (90 Base) MCG/ACT inhaler, INHALE TWO PUFFS BY MOUTH EVERY 4 HOURS AS NEEDED FOR WHEEZING AND  SHORTNESS OF BREATH (Patient taking differently: Inhale 2 puffs into the lungs every 4 (four) hours as needed for shortness of breath. ), Disp: 8.5 each, Rfl: 2 .  rosuvastatin (CRESTOR) 5 MG tablet, Take 5 mg by mouth daily. , Disp: , Rfl:  .  XARELTO 20 MG TABS tablet, TAKE ONE TABLET BY MOUTH DAILY WITH SUPPER (Patient taking differently: Take 20 mg by mouth daily with supper. ), Disp: 30 tablet, Rfl: 3  Social History   Tobacco Use  Smoking Status Former Smoker  . Packs/day: 2.00  . Years: 30.00  . Pack years: 60.00  . Types: Cigarettes  . Last attempt to quit: 03/14/1996  . Years since quitting: 22.4  Smokeless Tobacco Never Used    No Known Allergies Objective:  There were no vitals filed for this visit. There is no height or weight on file to calculate  BMI. Constitutional Well developed. Well nourished.  Vascular Foot warm and well perfused. Capillary refill normal to all digits.   Neurologic Normal speech. Oriented to person, place, and time. Epicritic sensation to light touch grossly present bilaterally.  Dermatologic Skin well healed left hallux 0.3 cm ulceration R 2nd toe erythema, warmth, no drainage.  Orthopedic: Tenderness to palpation noted about the surgical site.   Radiographs: Taken and reviewed dorsal bone erosion noted on the middle phalanx Assessment:   1. Diabetic ulcer of toe of right foot associated with type 2 diabetes mellitus, with necrosis of bone (HCC)   2. Bone erosion determined by x-ray   3. Cellulitis and abscess of toe of right foot   4. DM type 2 with diabetic peripheral neuropathy (HCC)   5. Acute osteomyelitis of toe, right (HCC)    Plan:  Patient was evaluated and treated and all questions answered.  S/p foot surgery left -Progressing as expected post-operatively. -XR: None -WB Status: WBAT in DM shoes -Sutures: out. Steri strips applied.. -Medications: none refilled. -Foot redressed.  R 2nd toe ulceration -X-rays taken reviewed with noted erosion of the middle phalanx on x-ray -Rx doxycycline for erythema of the digit -Discussed the patient that I do think due to the bone erosion he would benefit from partial versus total amputation of the digit.  We will plan for the procedure next week.  Amputation level to be determined by appearance of the skin and bone at time of surgery.  Consent form reviewed with patient -Discussed with patient we can plan for outpatient surgery for this issue. -Patient has failed all conservative therapy and wishes to proceed with surgical intervention. All risks, benefits, and alternatives discussed with patient. No guarantees given. Consent reviewed and signed by patient. -Planned procedures: Right 2nd toe partial versus total amputation  25  minutes of face to face  time were spent with the patient. >50% of this was spent on counseling and coordination of care. Specifically discussed with patient the above diagnoses and overall treatment plan for the 2nd toe ulceration.  Return in about 6 days (around 08/19/2018).

## 2018-08-13 NOTE — Progress Notes (Signed)
Subjective:  Patient ID: [name], male    DOB: 07/30/1945,  MRN: [id number]  Chief Complaint  Patient presents with  . Diabetic Ulcer    bilateral - second right toe looks a little worse   DOS: 06/18/18 Procedure: Partial amputation left great toe  73 y.o. male returns for post-op check. History as above.  Was seen by Dr. Wagoner at last visit and the toe is healing well states that the wound is doing worse since last visit states that he left his stocking on for 3 days and did not check his toe and now the toe is looking red and infected.  Review of Systems: Negative except as noted in the HPI. Denies N/V/F/Ch.  Past Medical History:  Diagnosis Date  . Chronic anticoagulation   . Chronic chest pain   . Chronic lower back pain   . Chronic venous insufficiency   . Emphysema   . Headache    "couple times/week" (06/17/2018)  . History of gout   . HTN (hypertension)   . Obesity   . OSA on CPAP   . PAF (paroxysmal atrial fibrillation) (HCC)   . Pneumonia ~ 1981 X 1  . Pneumothorax   . Rheumatoid arthritis (HCC)   . Type II diabetes mellitus (HCC)     Current Outpatient Medications:  .  amiodarone (PACERONE) 200 MG tablet, TAKE 1 TABLET BY MOUTH  DAILY (Patient taking differently: Take 200 mg by mouth daily. ), Disp: 90 tablet, Rfl: 1 .  ANORO ELLIPTA 62.5-25 MCG/INH AEPB, INHALE 1 PUFF INTO THE LUNGS DAILY., Disp: 60 each, Rfl: 5 .  atenolol (TENORMIN) 50 MG tablet, TAKE ONE TABLET BY MOUTH TWICE A DAY (Patient taking differently: Take 50 mg by mouth 2 (two) times daily. ), Disp: 180 tablet, Rfl: 2 .  benazepril-hydrochlorthiazide (LOTENSIN HCT) 10-12.5 MG tablet, HOLD UNTIL SEEN BY YOUR PCP, Disp: , Rfl:  .  benzonatate (TESSALON PERLES) 100 MG capsule, Take 1 capsule (100 mg total) by mouth every 6 (six) hours as needed for cough., Disp: 30 capsule, Rfl: 0 .  diltiazem (CARDIZEM CD) 180 MG 24 hr capsule, TAKE 1 CAPSULE BY MOUTH  DAILY (Patient taking differently: Take 180 mg  by mouth daily. ), Disp: 90 capsule, Rfl: 1 .  doxycycline (VIBRA-TABS) 100 MG tablet, Take 1 tablet (100 mg total) by mouth 2 (two) times daily., Disp: 20 tablet, Rfl: 0 .  furosemide (LASIX) 40 MG tablet, Take 40 mg by mouth daily. , Disp: , Rfl:  .  guaiFENesin (MUCINEX) 600 MG 12 hr tablet, Take 1 tablet (600 mg total) by mouth 2 (two) times daily as needed for to loosen phlegm., Disp: 30 tablet, Rfl: 0 .  HYDROcodone-acetaminophen (NORCO) 5-325 MG tablet, Take 1 tablet by mouth 2 (two) times daily as needed for moderate pain., Disp: 20 tablet, Rfl: 0 .  metFORMIN (GLUCOPHAGE-XR) 500 MG 24 hr tablet, Take 2 tablets by mouth 2 (two) times daily. , Disp: , Rfl:  .  omeprazole (PRILOSEC) 20 MG capsule, TAKE 1 CAPSULE BY MOUTH  DAILY (Patient taking differently: Take 20 mg by mouth daily. ), Disp: 90 capsule, Rfl: 1 .  potassium chloride SA (K-DUR,KLOR-CON) 20 MEQ tablet, TAKE ONE TABLET BY MOUTH TWO TIMES A DAY (Patient taking differently: Take 20 mEq by mouth 2 (two) times daily. ), Disp: 180 tablet, Rfl: 3 .  PROAIR HFA 108 (90 Base) MCG/ACT inhaler, INHALE TWO PUFFS BY MOUTH EVERY 4 HOURS AS NEEDED FOR WHEEZING AND    SHORTNESS OF BREATH (Patient taking differently: Inhale 2 puffs into the lungs every 4 (four) hours as needed for shortness of breath. ), Disp: 8.5 each, Rfl: 2 .  rosuvastatin (CRESTOR) 5 MG tablet, Take 5 mg by mouth daily. , Disp: , Rfl:  .  XARELTO 20 MG TABS tablet, TAKE ONE TABLET BY MOUTH DAILY WITH SUPPER (Patient taking differently: Take 20 mg by mouth daily with supper. ), Disp: 30 tablet, Rfl: 3  Social History   Tobacco Use  Smoking Status Former Smoker  . Packs/day: 2.00  . Years: 30.00  . Pack years: 60.00  . Types: Cigarettes  . Last attempt to quit: 03/14/1996  . Years since quitting: 22.4  Smokeless Tobacco Never Used    No Known Allergies Objective:  There were no vitals filed for this visit. There is no height or weight on file to calculate  BMI. Constitutional Well developed. Well nourished.  Vascular Foot warm and well perfused. Capillary refill normal to all digits.   Neurologic Normal speech. Oriented to person, place, and time. Epicritic sensation to light touch grossly present bilaterally.  Dermatologic Skin well healed left hallux 0.3 cm ulceration R 2nd toe erythema, warmth, no drainage.  Orthopedic: Tenderness to palpation noted about the surgical site.   Radiographs: Taken and reviewed dorsal bone erosion noted on the middle phalanx Assessment:   1. Diabetic ulcer of toe of right foot associated with type 2 diabetes mellitus, with necrosis of bone (HCC)   2. Bone erosion determined by x-ray   3. Cellulitis and abscess of toe of right foot   4. DM type 2 with diabetic peripheral neuropathy (HCC)   5. Acute osteomyelitis of toe, right (HCC)    Plan:  Patient was evaluated and treated and all questions answered.  S/p foot surgery left -Progressing as expected post-operatively. -XR: None -WB Status: WBAT in DM shoes -Sutures: out. Steri strips applied.. -Medications: none refilled. -Foot redressed.  R 2nd toe ulceration -X-rays taken reviewed with noted erosion of the middle phalanx on x-ray -Rx doxycycline for erythema of the digit -Discussed the patient that I do think due to the bone erosion he would benefit from partial versus total amputation of the digit.  We will plan for the procedure next week.  Amputation level to be determined by appearance of the skin and bone at time of surgery.  Consent form reviewed with patient -Discussed with patient we can plan for outpatient surgery for this issue. -Patient has failed all conservative therapy and wishes to proceed with surgical intervention. All risks, benefits, and alternatives discussed with patient. No guarantees given. Consent reviewed and signed by patient. -Planned procedures: Right 2nd toe partial versus total amputation  25  minutes of face to face  time were spent with the patient. >50% of this was spent on counseling and coordination of care. Specifically discussed with patient the above diagnoses and overall treatment plan for the 2nd toe ulceration.  Return in about 6 days (around 08/19/2018).

## 2018-08-13 NOTE — Patient Instructions (Signed)
Pre-Operative Instructions  Congratulations, you have decided to take an important step towards improving your quality of life.  You can be assured that the doctors and staff at [facility] will be with you every step of the way.  Here are some important things you should know:  1. Plan to be at the surgery center/hospital at least 1 (one) hour prior to your scheduled time, unless otherwise directed by the surgical center/hospital staff.  You must have a responsible adult accompany you, remain during the surgery and drive you home.  Make sure you have directions to the surgical center/hospital to ensure you arrive on time. 2. If you are having surgery at [facility] or [facility] hospitals, you will need a copy of your medical history and physical form from your family physician within one month prior to the date of surgery. We will give you a form for your primary physician to complete.  3. We make every effort to accommodate the date you request for surgery.  However, there are times where surgery dates or times have to be moved.  We will contact you as soon as possible if a change in schedule is required.   4. No aspirin/ibuprofen for one week before surgery.  If you are on aspirin, any non-steroidal anti-inflammatory medications (Mobic, Aleve, Ibuprofen) should not be taken seven (7) days prior to your surgery.  You make take Tylenol for pain prior to surgery.  5. Medications - If you are taking daily heart and blood pressure medications, seizure, reflux, allergy, asthma, anxiety, pain or diabetes medications, make sure you notify the surgery center/hospital before the day of surgery so they can tell you which medications you should take or avoid the day of surgery. 6. No food or drink after midnight the night before surgery unless directed otherwise by surgical center/hospital staff. 7. No alcoholic beverages 24-hours prior to surgery.  No smoking 24-hours prior or 24-hours after  surgery. 8. Wear loose pants or shorts. They should be loose enough to fit over bandages, boots, and casts. 9. Don't wear slip-on shoes. Sneakers are preferred. 10. Bring your boot with you to the surgery center/hospital.  Also bring crutches or a walker if your physician has prescribed it for you.  If you do not have this equipment, it will be provided for you after surgery. 11. If you have not been contacted by the surgery center/hospital by the day before your surgery, call to confirm the date and time of your surgery. 12. Leave-time from work may vary depending on the type of surgery you have.  Appropriate arrangements should be made prior to surgery with your employer. 13. Prescriptions will be provided immediately following surgery by your doctor.  Fill these as soon as possible after surgery and take the medication as directed. Pain medications will not be refilled on weekends and must be approved by the doctor. 14. Remove nail polish on the operative foot and avoid getting pedicures prior to surgery. 15. Wash the night before surgery.  The night before surgery wash the foot and leg well with water and the antibacterial soap provided. Be sure to pay special attention to beneath the toenails and in between the toes.  Wash for at least three (3) minutes. Rinse thoroughly with water and dry well with a towel.  Perform this wash unless told not to do so by your physician.  Enclosed: 1 Ice pack (please put in freezer the night before surgery)   1 Hibiclens skin cleaner     Pre-op instructions  If you have any questions regarding the instructions, please do not hesitate to call our office.  [place]: [address], , [place] [address] -- [phone]  [place]: [address] [place], [place] [address] -- [phone]  [place]: [address]  [place], [place] [address] -- [phone]  [place]: [address], [address], [place],  [address] -- [phone]  Website:  [email or web address] 

## 2018-08-18 NOTE — Patient Instructions (Signed)
Ricky [name] E [name]  1946/03/12     Your procedure is scheduled on:  08-20-2018   [facility],  Report to admitting at  2:45 PM    Call this number if you have problems the morning of surgery [phone]       Remember: Do not eat food After Midnight.   Clear liquid diet from midnight until 8:30 AM day of surgery.  Nothing by mouth after 8:30 AM including water, candy, gum, mints   BRUSH YOUR TEETH MORNING OF SURGERY AND RINSE YOUR MOUTH OUT        Take these medicines the morning of surgery with A SIP OF WATER:  Anoro Ellipta Inhaler,  Atenolol,  Diltiazem,  Omeprazole,  ProAir inhaler if needed and bring inhalers with you day of surgery.  DO NOT TAKE ANY DIABETIC MEDICATIONS DAY OF YOUR SURGERY                                   You may not have any metal on your body including hair pins and               piercings  Do not wear jewelry, make-up, lotions, powders or perfumes, deodorant                         Men may shave face and neck.      Do not bring valuables to the hospital. [facility] IS NOT             RESPONSIBLE   FOR VALUABLES.  Contacts, dentures or bridgework may not be worn into surgery.  Leave suitcase in the car. After surgery it may be brought to your room.     __________________________________________________________________________________    CLEAR LIQUID DIET   Foods Allowed                                                                     Foods Excluded  Coffee and tea, regular and decaf                             liquids that you cannot  Plain Jell-O in any flavor                                             see through such as: Fruit ices (not with fruit pulp)                                     milk, soups, orange juice  Iced Popsicles                                    All solid food Carbonated beverages, regular and diet  Cranberry, grape and apple  juices Sports drinks like Gatorade Lightly seasoned clear broth or consume(fat free) Sugar, honey syrup  Sample Menu Breakfast                                Lunch                                     Supper Cranberry juice                    Beef broth                            Chicken broth Jell-O                                     Grape juice                           Apple juice Coffee or tea                        Jell-O                                      Popsicle                                                Coffee or tea                        Coffee or tea  _____________________________________________________________________    [facility] - Preparing for Surgery Before surgery, you can play an important role.  Because skin is not sterile, your skin needs to be as free of germs as possible.  You can reduce the number of germs on your skin by washing with CHG (chlorahexidine gluconate) soap before surgery.  CHG is an antiseptic cleaner which kills germs and bonds with the skin to continue killing germs even after washing. Please DO NOT use if you have an allergy to CHG or antibacterial soaps.  If your skin becomes reddened/irritated stop using the CHG and inform your nurse when you arrive at Short Stay. Do not shave (including legs and underarms) for at least 48 hours prior to the first CHG shower.  You may shave your face/neck. Please follow these instructions carefully:  1.  Shower with CHG Soap the night before surgery and the  morning of Surgery.  2.  If you choose to wash your hair, wash your hair first as usual with your  normal  shampoo.  3.  After you shampoo, rinse your hair and body thoroughly to remove the  shampoo.                            4.  Use CHG as you would any other liquid soap.  You can apply chg directly  to the skin and wash  Gently with a scrungie or clean washcloth.  5.  Apply the CHG Soap to your body ONLY FROM THE NECK DOWN.   Do  not use on face/ open                           Wound or open sores. Avoid contact with eyes, ears mouth and genitals (private parts).                       Wash face,  Genitals (private parts) with your normal soap.             6.  Wash thoroughly, paying special attention to the area where your surgery  will be performed.  7.  Thoroughly rinse your body with warm water from the neck down.  8.  DO NOT shower/wash with your normal soap after using and rinsing off  the CHG Soap.             9.  Pat yourself dry with a clean towel.            10.  Wear clean pajamas.            11.  Place clean sheets on your bed the night of your first shower and do not  sleep with pets. Day of Surgery : Do not apply any lotions/deodorants the morning of surgery.  Please wear clean clothes to the hospital/surgery center.  FAILURE TO FOLLOW THESE INSTRUCTIONS MAY RESULT IN THE CANCELLATION OF YOUR SURGERY PATIENT SIGNATURE_________________________________  NURSE SIGNATURE__________________________________  ________________________________________________________________________

## 2018-08-19 ENCOUNTER — Ambulatory Visit (HOSPITAL_COMMUNITY)
Admission: RE | Admit: 2018-08-19 | Discharge: 2018-08-19 | Disposition: A | Discharge status: Home / Self Care | Payer: Medicare Other | Source: Ambulatory Visit | Attending: Anesthesiology | Admitting: Anesthesiology

## 2018-08-19 ENCOUNTER — Encounter: Payer: Self-pay | Admitting: Podiatry

## 2018-08-19 ENCOUNTER — Other Ambulatory Visit: Payer: Self-pay

## 2018-08-19 ENCOUNTER — Encounter (HOSPITAL_COMMUNITY): Payer: Self-pay | Admitting: Physician Assistant

## 2018-08-19 ENCOUNTER — Encounter (HOSPITAL_COMMUNITY): Payer: Self-pay

## 2018-08-19 ENCOUNTER — Encounter (HOSPITAL_COMMUNITY): Payer: Self-pay | Admitting: Certified Registered"

## 2018-08-19 ENCOUNTER — Encounter (HOSPITAL_COMMUNITY)
Admission: RE | Admit: 2018-08-19 | Discharge: 2018-08-19 | Disposition: A | Discharge status: Home / Self Care | Payer: Medicare Other | Source: Ambulatory Visit | Attending: Podiatry | Admitting: Podiatry

## 2018-08-19 ENCOUNTER — Ambulatory Visit (INDEPENDENT_AMBULATORY_CARE_PROVIDER_SITE_OTHER): Payer: Self-pay | Admitting: Podiatry

## 2018-08-19 ENCOUNTER — Telehealth: Payer: Self-pay | Admitting: *Deleted

## 2018-08-19 ENCOUNTER — Encounter: Payer: Self-pay | Admitting: *Deleted

## 2018-08-19 DIAGNOSIS — E11621 Type 2 diabetes mellitus with foot ulcer: Secondary | ICD-10-CM

## 2018-08-19 DIAGNOSIS — Z01818 Encounter for other preprocedural examination: Secondary | ICD-10-CM | POA: Insufficient documentation

## 2018-08-19 DIAGNOSIS — Z9889 Other specified postprocedural states: Secondary | ICD-10-CM

## 2018-08-19 DIAGNOSIS — L97514 Non-pressure chronic ulcer of other part of right foot with necrosis of bone: Secondary | ICD-10-CM

## 2018-08-19 HISTORY — DX: Gastro-esophageal reflux disease without esophagitis: K21.9

## 2018-08-19 HISTORY — DX: Emphysema, unspecified: J43.9

## 2018-08-19 HISTORY — DX: Presence of dental prosthetic device (complete) (partial): Z97.2

## 2018-08-19 HISTORY — DX: Presence of spectacles and contact lenses: Z97.3

## 2018-08-19 HISTORY — DX: Dyspnea, unspecified: R06.00

## 2018-08-19 HISTORY — DX: Polyneuropathy, unspecified: G62.9

## 2018-08-19 HISTORY — DX: Unspecified symptoms and signs involving the genitourinary system: R39.9

## 2018-08-19 HISTORY — DX: Other forms of dyspnea: R06.09

## 2018-08-19 HISTORY — DX: Localized edema: R60.0

## 2018-08-19 HISTORY — DX: Diaphragmatic hernia without obstruction or gangrene: K44.9

## 2018-08-19 LAB — BASIC METABOLIC PANEL
ANION GAP: 9 (ref 5–15)
BUN: 33 mg/dL — ABNORMAL HIGH (ref 8–23)
CALCIUM: 9.3 mg/dL (ref 8.9–10.3)
CO2: 21 mmol/L — ABNORMAL LOW (ref 22–32)
Chloride: 108 mmol/L (ref 98–111)
Creatinine, Ser: 1.45 mg/dL — ABNORMAL HIGH (ref 0.61–1.24)
GFR calc Af Amer: 55 mL/min — ABNORMAL LOW (ref >60)
GFR calc non Af Amer: 48 mL/min — ABNORMAL LOW (ref >60)
Glucose, Bld: 123 mg/dL — ABNORMAL HIGH (ref 70–99)
Potassium: 4.4 mmol/L (ref 3.5–5.1)
Sodium: 138 mmol/L (ref 135–145)

## 2018-08-19 LAB — HEMOGLOBIN A1C
Hgb A1c MFr Bld: 6.6 % — ABNORMAL HIGH (ref 4.8–5.6)
Mean Plasma Glucose: 142.72 mg/dL

## 2018-08-19 LAB — CBC
HCT: 35.2 % — ABNORMAL LOW (ref 39.0–52.0)
Hemoglobin: 10.5 g/dL — ABNORMAL LOW (ref 13.0–17.0)
MCH: 26.6 pg (ref 26.0–34.0)
MCHC: 29.8 g/dL — ABNORMAL LOW (ref 30.0–36.0)
MCV: 89.1 fL (ref 80.0–100.0)
NRBC: 0.2 % (ref 0.0–0.2)
Platelets: 147 10*3/uL — ABNORMAL LOW (ref 150–400)
RBC: 3.95 MIL/uL — ABNORMAL LOW (ref 4.22–5.81)
RDW: 18.6 % — ABNORMAL HIGH (ref 11.5–15.5)
WBC: 15.3 10*3/uL — ABNORMAL HIGH (ref 4.0–10.5)

## 2018-08-19 NOTE — Progress Notes (Signed)
Per Dr. Samuella Cota, I sent a medical clearance request letter to Dr. Swaziland and Dr. Bertis Ruddy.

## 2018-08-19 NOTE — Telephone Encounter (Signed)
[name], I am calling to let you know that we must cancel your surgery for tomorrow.  Dr. Rene KocherEksir recommends we get medical clearance from your Cardiologist and from your Oncologist prior to doing the surgery.  "Okay"  I'll send both of them a medical clearance request letter.  We'll get you rescheduled once we get the clearance.  "Alright"   I sent a medical clearance request letter to Dr. Peter SwazilandJordan and to Dr. Artis DelayNi Gorsuch.

## 2018-08-19 NOTE — Telephone Encounter (Signed)
"  [name] has been cleared by Dr. Rene Kocher but she recommends he get Cardiac Clearance and Hematology clearance from his Oncologist."  I will see what I can do or find out."

## 2018-08-19 NOTE — Progress Notes (Signed)
Last cardiologist office note, dr Swaziland, dated 09-25-2017 in epic. Last pulmonologist office note, dr byrum, dated 12-30-2017 in epic.  EKG dated 06-17-2018 in epic. CXR 1 view dated 06-24-2018 in epic.  Per pt has not had follow-up chest xray since.  Chest 2 view xray ordered to be done today.  Have not received pt's pcp h&p from dr price office.  Noted in epic pt's pcp, dr Rene Kocher, that pt would need cardiac clearance as well.  Per pt he has not seen dr Swaziland since 09-25-2017. Pt is to follow up every six months. No documentation in epic that clearance was requested. Pt is on xarelto and stated he was not told by dr price he needed to stop.  Per pt has chronic chest pain and dr Swaziland aware.  Today PAT visit pt stated he last had chest pain today two hours ago. He stated he just sat down rested and pet his dog in an hour is was gone, he does not take nitroglycerin.  Stated chest pain upper left chest , no sweating, no nausea, and no pain anywhere else on his body.  Abnormal cbc and cmp results dated 08-19-2018 in epic.  A1c and CXR results pending.  Chart given to anesthesia for review, Jodell Cipro PA.

## 2018-08-19 NOTE — Progress Notes (Signed)
Anesthesia Chart Review   Case:  [id number] Date/Time:  08/20/18 1630   Procedure:  Right 2nd Toe Partial vs Total Toe Amputation (Right ) - Leave patient on stretcher   Anesthesia type:  Monitor Anesthesia Care   Diagnosis:  Diabetic ulcer of toe of right foot associated with type 2 diabetes mellitus, limited to breakdown of skin (HCC) [E45.409[E11.621, L97.511]   Pre-op diagnosis:  Osteomyelitis   Location:  [facility] ROOM 08 / [facility]   Surgeon:  Park LiterPrice, Michael J, DPM      DISCUSSION: 73 yo former smoker (quit 03/14/96) with h/o A-fib (on Xarelto), HTN, venous insufficiency, OSA w/CPAP, DM II, hiatal hernia, COPD, CHF, GERD, CKD Stage III, diabetic ulcer of right foot scheduled for above procedure 08/20/18 with Dr. Ventura SellersMichael Price.   Recent hospital admission 12/5-12/15/19 due to left toe osteomyelitis, underwent left great toe amputation 12/6 by Dr. Samuella CotaPrice.  Last seen by Dr. Samuella CotaPrice on 08/13/18.  At this visit wound healing well, new right 2nd to ulceration noted with bone erosion, amputation scheduled as outlined above.    Pt also diagnosed with pneumonia during admission and experienced exacerbation of CHF with improvement with diuresis along with AKI on CKD Stage II.  On admission creatinine of 2.15 baseline 1.4-1.5.  Creatinine at PST visit 1.45.  Per discharge summary pt had significant improvement of lower leg edema and shortness of breath and was no longer requiring supplemental O2.  Close follow up with PCP recommended.    Pt followed up with PCP, Dr. Brett FairySamantha Eksir after admission on 06/30/28.  She referred pt to heme/onc due to smudge cells on CBC.  Dr. Rene KocherEksir also ordered a repeat chest xray on 07/28/18 to make sure pna had completely resolved, this has not been done. Per Dr. Rene KocherEksir pt, "cleared from my standpoint except I recommend cardiology clearance (hasn't followed up with them as scheduled over the past year) and clearance from heme/onc doctor based on his last CBC results due to infection risk."  Pt last  seen by cardiology, Dr. Peter SwazilandJordan, on 09/25/17.  Per his note, "Long history of atypical chest pain in the past. Symptoms exacerbated by anemia.  Myoview showed small area of apical ischemia. EF normal. Cardiac cath showed no significant CAD."  No instructions given to pt regarding holding Xarelto.  Pt needs cardiology clearance and heme/onc clearance per PCP as stated above.  Discussed with Dr. Kandice HamsPrice's office.  VS: BP (!) 146/54 (BP Location: Right Arm)   Pulse 62   Temp 36.8 C (Oral)   Resp 18   Ht 6\' 6"  (1.981 m)   Wt (!) 152.6 kg   SpO2 98%   BMI 38.87 kg/m   PROVIDERS: [facility]   SwazilandJordan, Peter, MD is Cardiologist  LABS: Labs reviewed: Acceptable for surgery. (all labs ordered are listed, but only abnormal results are displayed)  Labs Reviewed  BASIC METABOLIC PANEL - Abnormal; Notable for the following components:      Result Value   CO2 21 (*)    Glucose, Bld 123 (*)    BUN 33 (*)    Creatinine, Ser 1.45 (*)    GFR calc non Af Amer 48 (*)    GFR calc Af Amer 55 (*)    All other components within normal limits  CBC - Abnormal; Notable for the following components:   WBC 15.3 (*)    RBC 3.95 (*)    Hemoglobin 10.5 (*)    HCT 35.2 (*)  MCHC 29.8 (*)    RDW 18.6 (*)    Platelets 147 (*)    All other components within normal limits  HEMOGLOBIN A1C     IMAGES:   EKG: 06/17/18 Sinus rhythm with 1st degree AV block LAFB Cannot rule out Anterior infarct, age undetermined  CV: Echo 08/23/2016 Study Conclusions  - Left ventricle: The cavity size was normal. Wall thickness was   increased in a pattern of moderate LVH. Systolic function was   normal. The estimated ejection fraction was in the range of 60%   to 65%. Wall motion was normal; there were no regional wall   motion abnormalities. Features are consistent with a pseudonormal   left ventricular filling pattern, with concomitant abnormal   relaxation and increased  filling pressure (grade 2 diastolic   dysfunction).  Cardiac Cath 10/17/2016  Prox LAD lesion, 10 %stenosed.  Prox RCA to Mid RCA lesion, 15 %stenosed.  The left ventricular systolic function is normal.  LV end diastolic pressure is mildly elevated.   1. No significant CAD 2. Normal LV function 3. Mildly elevated LVEDP  Plan: medical management.  Past Medical History:  Diagnosis Date  . Bilateral lower extremity edema    per pt wears support hose  . Chronic anticoagulation    xarelto  (followed by dr Swaziland)  . Chronic chest pain   . Chronic lower back pain   . Chronic venous insufficiency   . COPD with emphysema (HCC)   . DOE (dyspnea on exertion)   . GERD (gastroesophageal reflux disease)   . Headache    "couple times/week" (06/17/2018)  . Hiatal hernia   . History of gout    per pt last flare-up 2013  approx.  Marland Kitchen HTN (hypertension)   . Lower urinary tract symptoms (LUTS)    urologist-- dr Vernie Ammons  . OSA on CPAP   . PAF (paroxysmal atrial fibrillation) [facility])    cardiologist-- dr Swaziland  . Peripheral neuropathy   . Rheumatoid arthritis (HCC)    followed by pcp  . Type II diabetes mellitus (HCC)    followed by pcp  . Wears dentures    full upper;  lower partial  . Wears glasses     Past Surgical History:  Procedure Laterality Date  . AMPUTATION Right 07/23/2017   Procedure: right great toe amputation;  Surgeon: Eldred Manges, MD;  [facility];  Service: Orthopedics;  Laterality: Right;  . AMPUTATION Left 06/18/2018   Procedure: AMPUTATION LEFT GREAT TOE INTERPHALYNGEAL JOINT;  Surgeon: Park Liter, DPM;  Location[facility];  Service: Podiatry;  Laterality: Left;  . AMPUTATION TOE Left 06/18/2018   Procedure: PROXIMAL PHALYNX RESECTION;  Surgeon: Park Liter, DPM;  Location: [facility];  Service: Podiatry;  Laterality: Left;  . ATRIAL FIBRILLATION ABLATION  X 2   [place]; Durwin Nora  . BONE BIOPSY Left 06/18/2018   Procedure: BONE BIOPSY LEFT GREAT TOE;   Surgeon: Park Liter, DPM;  Location: [facility];  Service: Podiatry;  Laterality: Left;  . CARDIAC CATHETERIZATION  1998   NORMAL  . COLONOSCOPY WITH PROPOFOL N/A 08/25/2016   Procedure: COLONOSCOPY WITH PROPOFOL;  Surgeon: Carman Ching, MD;  [facility];  Service: Endoscopy;  Laterality: N/A;  . CYST EXCISION     "all over; back, arms; Dr. Magnus Ivan"  . HERNIA REPAIR    . INGUINAL HERNIA REPAIR Left   . LEFT HEART CATH AND CORONARY ANGIOGRAPHY N/A 10/17/2016   Procedure: Left Heart Cath and Coronary  Angiography;  Surgeon: Peter M Swaziland, MD;  Location: [facility] INVASIVE CV LAB;  Service: Cardiovascular;  Laterality: N/A;  . PLEURAL SCARIFICATION    . TONSILLECTOMY    . UMBILICAL HERNIA REPAIR    . VEIN LIGATION AND STRIPPING      MEDICATIONS: . acetaminophen (TYLENOL) 500 MG tablet  . amiodarone (PACERONE) 200 MG tablet  . ANORO ELLIPTA 62.5-25 MCG/INH AEPB  . atenolol (TENORMIN) 50 MG tablet  . benazepril-hydrochlorthiazide (LOTENSIN HCT) 10-12.5 MG tablet  . benzonatate (TESSALON PERLES) 100 MG capsule  . diltiazem (CARDIZEM CD) 180 MG 24 hr capsule  . doxycycline (VIBRA-TABS) 100 MG tablet  . furosemide (LASIX) 40 MG tablet  . guaiFENesin (MUCINEX) 600 MG 12 hr tablet  . HYDROcodone-acetaminophen (NORCO) 5-325 MG tablet  . metFORMIN (GLUCOPHAGE-XR) 500 MG 24 hr tablet  . omeprazole (PRILOSEC) 20 MG capsule  . potassium chloride SA (K-DUR,KLOR-CON) 20 MEQ tablet  . PROAIR HFA 108 (90 Base) MCG/ACT inhaler  . rosuvastatin (CRESTOR) 5 MG tablet  . XARELTO 20 MG TABS tablet   No current facility-administered medications for this encounter.     Janey Genta [facility] Pre-Surgical Testing [phone] 08/19/18 5:06 PM

## 2018-08-20 ENCOUNTER — Encounter (HOSPITAL_COMMUNITY): Admission: RE | Payer: Self-pay | Source: Ambulatory Visit

## 2018-08-20 ENCOUNTER — Ambulatory Visit (HOSPITAL_COMMUNITY): Admission: RE | Admit: 2018-08-20 | Payer: Medicare Other | Source: Ambulatory Visit | Admitting: Podiatry

## 2018-08-20 ENCOUNTER — Telehealth: Payer: Self-pay

## 2018-08-20 SURGERY — AMPUTATION, TOE
Anesthesia: Monitor Anesthesia Care | Laterality: Right

## 2018-08-20 NOTE — Telephone Encounter (Signed)
Patient with diagnosis of atrial fibrillation on Xarelto for anticoagulation.    Procedure: amputation of 2nd toe on right foot Date of procedure: TBD  CHADS2-VASc score of  4 (CHF, HTN, AGE, DM2, )  CrCl 98.7 Platelet count 147  Per office protocol, patient can hold Xarelto for 2 days prior to procedure.    Patient should restart Xarelto on the evening of procedure or day after, at discretion of procedure MD

## 2018-08-20 NOTE — Telephone Encounter (Signed)
   [facility] Pre-operative Risk Assessment    Request for surgical clearance:  1. What type of surgery is being performed? Amputation Toe Interphalangeal 2nd digit of the right foot   2. When is this surgery scheduled? TBD  3. What type of clearance is required (medical clearance vs. Pharmacy clearance to hold med vs. Both)? Both  4. Are there any medications that need to be held prior to surgery and how long? Xarelto  5. Practice name and name of physician performing surgery? [facility]  Ventura Sellers DPM  Dr.Eksir  6. What is your office phone number [phone]   7.   What is your office fax number [phone]  8.   Anesthesia type  Not listed   Neoma Laming 08/20/2018, 8:39 AM  _________________________________________________________________   (provider comments below)

## 2018-08-23 NOTE — Progress Notes (Signed)
Cardiology Office Note   Date:  08/24/2018   ID:  [name], Ricky 08/11/1945, MRN [id number]  [facility]  [name]  Cardiologist:  Dr.[name]  Chief Complaint  Patient presents with  . Pre-op Exam     History of Present Illness: [name] is a 73 y.o. male who presents for pre-operative cardiac evaluation for amputation toe interphalangeal 2nd digit of the right foot by Dr.Michael Price on date to be determined.   He is being followed by cardiology for PAF and is on Xarelto,and chronic amiodarone therapy since 2011. He has chronic chest pain with prior cardiac cath revealing normal coronaries 10/17/16.  and chronic venous stasis disease with prior vein stripping. Other history includes iron deficiency anemia,Type II diabetes. HTN and COPD.   He has been doing well, no complaints of chest pain, but has chronic DOE from obesity. He is being followed by PCP for diabetes management. He states that he is not active at all.    Past Medical History:  Diagnosis Date  . Bilateral lower extremity edema    per pt wears support hose  . Chronic anticoagulation    xarelto  (followed by dr Swaziland)  . Chronic chest pain   . Chronic lower back pain   . Chronic venous insufficiency   . COPD with emphysema (HCC)   . DOE (dyspnea on exertion)   . GERD (gastroesophageal reflux disease)   . Headache    "couple times/week" (06/17/2018)  . Hiatal hernia   . History of gout    per pt last flare-up 2013  approx.  Marland Kitchen HTN (hypertension)   . Lower urinary tract symptoms (LUTS)    urologist-- dr Vernie Ammons  . OSA on CPAP   . PAF (paroxysmal atrial fibrillation) [facility])    cardiologist-- dr Swaziland  . Peripheral neuropathy   . Rheumatoid arthritis (HCC)    followed by pcp  . Type II diabetes mellitus (HCC)    followed by pcp  . Wears dentures    full upper;  lower partial  . Wears glasses     Past Surgical History:  Procedure Laterality Date  . AMPUTATION Right 07/23/2017   Procedure: right great toe amputation;  Surgeon: Eldred Manges, MD;  [facility];  Service: Orthopedics;  Laterality: Right;  . AMPUTATION Left 06/18/2018   Procedure: AMPUTATION LEFT GREAT TOE INTERPHALYNGEAL JOINT;  Surgeon: Park Liter, DPM;  [facility];  Service: Podiatry;  Laterality: Left;  . AMPUTATION TOE Left 06/18/2018   Procedure: PROXIMAL PHALYNX RESECTION;  Surgeon: Park Liter, DPM;  Location[facility];  Service: Podiatry;  Laterality: Left;  . ATRIAL FIBRILLATION ABLATION  X 2   [place]; Durwin Nora  . BONE BIOPSY Left 06/18/2018   Procedure: BONE BIOPSY LEFT GREAT TOE;  Surgeon: Park Liter, DPM;  [facility];  Service: Podiatry;  Laterality: Left;  . CARDIAC CATHETERIZATION  1998   NORMAL  . COLONOSCOPY WITH PROPOFOL N/A 08/25/2016   Procedure: COLONOSCOPY WITH PROPOFOL;  Surgeon: Carman Ching, MD;  [facility];  Service: Endoscopy;  Laterality: N/A;  . CYST EXCISION     "all over; back, arms; Dr. Magnus Ivan"  . HERNIA REPAIR    . INGUINAL HERNIA REPAIR Left   . LEFT HEART CATH AND CORONARY ANGIOGRAPHY N/A 10/17/2016   Procedure: Left Heart Cath and Coronary Angiography;  Surgeon: Peter M Swaziland, MD;  Location: [facility] INVASIVE CV LAB;  Service: Cardiovascular;  Laterality: N/A;  .  PLEURAL SCARIFICATION    . TONSILLECTOMY    . UMBILICAL HERNIA REPAIR    . VEIN LIGATION AND STRIPPING       Current Outpatient Medications  Medication Sig Dispense Refill  . acetaminophen (TYLENOL) 500 MG tablet Take 1,000-1,500 mg by mouth daily as needed for moderate pain or headache.    Marland Kitchen amiodarone (PACERONE) 200 MG tablet TAKE 1 TABLET BY MOUTH  DAILY 90 tablet 1  . ANORO ELLIPTA 62.5-25 MCG/INH AEPB INHALE 1 PUFF INTO THE LUNGS DAILY. 60 each 5  . atenolol (TENORMIN) 50 MG tablet TAKE ONE TABLET BY MOUTH TWICE A DAY 180 tablet 2  . benazepril-hydrochlorthiazide (LOTENSIN HCT) 10-12.5 MG tablet HOLD UNTIL SEEN BY YOUR PCP    . benzonatate (TESSALON PERLES) 100 MG  capsule Take 1 capsule (100 mg total) by mouth every 6 (six) hours as needed for cough. 30 capsule 0  . diltiazem (CARDIZEM CD) 180 MG 24 hr capsule TAKE 1 CAPSULE BY MOUTH  DAILY 90 capsule 1  . doxycycline (VIBRA-TABS) 100 MG tablet Take 1 tablet (100 mg total) by mouth 2 (two) times daily. 20 tablet 0  . furosemide (LASIX) 40 MG tablet Take 40 mg by mouth daily.     Marland Kitchen guaiFENesin (MUCINEX) 600 MG 12 hr tablet Take 1 tablet (600 mg total) by mouth 2 (two) times daily as needed for to loosen phlegm. 30 tablet 0  . HYDROcodone-acetaminophen (NORCO) 5-325 MG tablet Take 1 tablet by mouth 2 (two) times daily as needed for moderate pain. 20 tablet 0  . metFORMIN (GLUCOPHAGE-XR) 500 MG 24 hr tablet Take 1,000 mg by mouth 2 (two) times daily.     Marland Kitchen omeprazole (PRILOSEC) 20 MG capsule TAKE 1 CAPSULE BY MOUTH  DAILY 90 capsule 1  . potassium chloride SA (K-DUR,KLOR-CON) 20 MEQ tablet TAKE ONE TABLET BY MOUTH TWO TIMES A DAY 180 tablet 3  . PROAIR HFA 108 (90 Base) MCG/ACT inhaler INHALE TWO PUFFS BY MOUTH EVERY 4 HOURS AS NEEDED FOR WHEEZING AND  SHORTNESS OF BREATH 8.5 each 2  . rosuvastatin (CRESTOR) 5 MG tablet Take 5 mg by mouth every evening.     [name] 20 MG TABS tablet TAKE ONE TABLET BY MOUTH DAILY WITH SUPPER 30 tablet 3   No current facility-administered medications for this visit.     Allergies:   Other    Social History:  The patient  reports that he quit smoking about 22 years ago. His smoking use included cigarettes. He has a 60.00 pack-year smoking history. He has never used smokeless tobacco. He reports current alcohol use. He reports that he does not use drugs.   Family History:  The patient's family history includes ALS in his sister; Bone cancer in his mother; Coronary artery disease (age of onset: 62) in his brother; Heart failure (age of onset: 81) in his brother; Other in his father; Rheum arthritis in his brother, brother, and mother.    ROS: All other systems are reviewed  and negative. Unless otherwise mentioned in H&P    PHYSICAL EXAM: VS:  BP 138/66   Pulse 68   Ht 6\' 6"  (1.981 m)   Wt (!) 336 lb 9.6 oz (152.7 kg)   SpO2 96%   BMI 38.90 kg/m  , BMI Body mass index is 38.9 kg/m. GEN: Well nourished, well developed, in no acute distress, morbidly obese HEENT: normal Neck: no JVD, carotid bruits, or masses Cardiac: RRR, occasional extra systole, no murmurs, rubs, or gallops,no  edema  Respiratory:  Clear to auscultation bilaterally, normal work of breathing GI: soft, nontender, nondistended, + BS MS: no deformity or atrophy Skin: warm and dry, no rash Neuro:  Strength and sensation are intact Psych: euthymic mood, full affect   EKG:  Sinus bradycardia rate of 58 bpm with LAFB.    Recent Labs: 08/19/2018: BUN 33; Creatinine, Ser 1.45; Hemoglobin 10.5; Platelets 147; Potassium 4.4; Sodium 138    Lipid Panel    Component Value Date/Time   CHOL 84 06/19/2018 0534   TRIG 134 06/19/2018 0534   HDL 17 (L) 06/19/2018 0534   CHOLHDL 4.9 06/19/2018 0534   VLDL 27 06/19/2018 0534   LDLCALC 40 06/19/2018 0534      Wt Readings from Last 3 Encounters:  08/24/18 (!) 336 lb 9.6 oz (152.7 kg)  08/19/18 (!) 336 lb 6 oz (152.6 kg)  06/27/18 (!) 344 lb 3 oz (156.1 kg)      Other studies Reviewed: Cardiac cath 10/17/2016     Prox LAD lesion, 10 %stenosed.  Prox RCA to Mid RCA lesion, 15 %stenosed.  The left ventricular systolic function is normal.  LV end diastolic pressure is mildly elevated.   1. No significant CAD 2. Normal LV function 3. Mildly elevated LVEDP  Plan: medical management.    Assessment and Plan  1. Pre-operative Cardiac Evaluation;     Chart reviewed as part of pre-operative protocol coverage. Given past medical history and time since last visit, based on ACC/AHA guidelines, Corky CraftsMark E Mortensen would be at acceptable risk for the planned procedure without further cardiovascular testing. He should hold his Xarelto for 48 hours  prior to planned amputation and start back ASAP afterwards.  2. Atrial fibrillation: Heart rate is well controlled. He has no complaints of bleeding or hemoptysis. No changes on his regimen.   3. Hypertension: Currently well controlled. No changes in his regimen.  4. DOE: Multiple etiologies, morbid obesity, deconditioning. COPD and OSA    Bettey MareKathryn M. Koleen Celia DNP, ANP, AACC  08/24/2018, 11:03 AM

## 2018-08-24 ENCOUNTER — Ambulatory Visit: Payer: Medicare Other | Admitting: Adult Health

## 2018-08-24 ENCOUNTER — Encounter: Payer: Self-pay | Admitting: Adult Health

## 2018-08-24 ENCOUNTER — Other Ambulatory Visit: Payer: Medicare Other

## 2018-08-24 VITALS — BP 138/66 | HR 68 | Ht 78.0 in | Wt 336.6 lb

## 2018-08-24 DIAGNOSIS — I10 Essential (primary) hypertension: Secondary | ICD-10-CM

## 2018-08-24 DIAGNOSIS — I480 Paroxysmal atrial fibrillation: Secondary | ICD-10-CM | POA: Diagnosis not present

## 2018-08-24 DIAGNOSIS — Z01810 Encounter for preprocedural cardiovascular examination: Secondary | ICD-10-CM | POA: Diagnosis not present

## 2018-08-24 NOTE — Patient Instructions (Addendum)
Cleared for amputation of R-2nd toe with [facility]  Ventura Sellers DPM  Dr.Eksir  hold Xarelto for 2 days prior to procedure and restart when told by surgeon to restart. We will notify surgeon of cardiac clearance. Follow-Up: You will need a follow up appointment in 6 months.  Please call our office 2 months (June 2020) in advance to schedule this(AUGUST 2020) appointment.  You may see No primary care provider on file. or one of the following Advanced Practice Providers on your designated Care Team:  Azalee Course, PA-C Micah Flesher, [place]        Medication Instructions:  NO CHANGES- Your physician recommends that you continue on your current medications as directed. Please refer to the Current Medication list given to you today. If you need a refill on your cardiac medications before your next appointment, please call your pharmacy. Labwork: When you have labs (blood work) and your tests are completely normal, you will receive your results ONLY by MyChart Message (if you have MyChart) -OR- A paper copy in the mail.  At [facility], you and your health needs are our priority.  As part of our continuing mission to provide you with exceptional heart care, we have created designated Provider Care Teams.  These Care Teams include your primary Cardiologist (physician) and Advanced Practice Providers (APPs -  Physician Assistants and Nurse Practitioners) who all work together to provide you with the care you need, when you need it.  Thank you for choosing [facility]!!

## 2018-08-24 NOTE — Telephone Encounter (Signed)
Patient is here to see [name] today, spoke with [name], will defer clearance to her.

## 2018-08-24 NOTE — Telephone Encounter (Signed)
Postop visits have been rescheduled. °

## 2018-08-24 NOTE — Telephone Encounter (Signed)
I am calling to let you know that Dr. Samuella Cota received clearance for you to have your surgery.  He has scheduled you for Friday, 08/27/2018 at 2:30pm at [facility].  "Okay, what time do I need to be there?"  They usually have you arrive about two hours prior to that time.  You should receive a call from a pre-admit nurse.  "Okay, thank you."

## 2018-08-26 ENCOUNTER — Encounter (HOSPITAL_COMMUNITY): Payer: Self-pay | Admitting: *Deleted

## 2018-08-26 ENCOUNTER — Other Ambulatory Visit: Payer: Self-pay

## 2018-08-26 ENCOUNTER — Encounter: Payer: Self-pay | Admitting: Hematology and Oncology

## 2018-08-26 NOTE — Progress Notes (Signed)
Spoke with pt for pre-op call. Pt has hx of A-fib and is on Xarelto. Was instructed to stop 48 hours prior to surgery. Pt states his last dose was 5 pm on 08/25/18. Pt is a type 2 diabetic. Last A1C was 6.6 on 08/19/18. Pt states his fasting blood sugar is usually between 170-175. Instructed pt to check his blood sugar in the AM when he gets up and every 2 hours until he leaves for the hospital. If blood sugar is 70 or below, treat with 1/2 cup of clear juice (apple or cranberry) and recheck blood sugar 15 minutes after drinking juice. If blood sugar continues to be 70 or below, call the Short Stay department and ask to speak to a nurse. Pt voiced understanding.

## 2018-08-27 ENCOUNTER — Other Ambulatory Visit: Payer: Self-pay | Admitting: Emergency Medicine

## 2018-08-27 ENCOUNTER — Encounter (HOSPITAL_COMMUNITY): Payer: Self-pay

## 2018-08-27 ENCOUNTER — Ambulatory Visit (HOSPITAL_COMMUNITY)
Admission: RE | Admit: 2018-08-27 | Discharge: 2018-08-27 | Disposition: A | Discharge status: Home / Self Care | Payer: Medicare Other | Source: Ambulatory Visit | Attending: Podiatry | Admitting: Podiatry

## 2018-08-27 ENCOUNTER — Ambulatory Visit (HOSPITAL_COMMUNITY): Payer: Medicare Other | Admitting: Certified Registered Nurse Anesthetist

## 2018-08-27 ENCOUNTER — Ambulatory Visit (HOSPITAL_COMMUNITY): Payer: Medicare Other

## 2018-08-27 ENCOUNTER — Encounter (HOSPITAL_COMMUNITY)
Admission: RE | Disposition: A | Discharge status: Home / Self Care | Payer: Self-pay | Source: Ambulatory Visit | Attending: Podiatry

## 2018-08-27 DIAGNOSIS — L97514 Non-pressure chronic ulcer of other part of right foot with necrosis of bone: Secondary | ICD-10-CM | POA: Diagnosis not present

## 2018-08-27 DIAGNOSIS — E1169 Type 2 diabetes mellitus with other specified complication: Secondary | ICD-10-CM | POA: Diagnosis not present

## 2018-08-27 DIAGNOSIS — L03031 Cellulitis of right toe: Secondary | ICD-10-CM | POA: Diagnosis not present

## 2018-08-27 DIAGNOSIS — Z79899 Other long term (current) drug therapy: Secondary | ICD-10-CM | POA: Insufficient documentation

## 2018-08-27 DIAGNOSIS — E11621 Type 2 diabetes mellitus with foot ulcer: Secondary | ICD-10-CM | POA: Diagnosis not present

## 2018-08-27 DIAGNOSIS — G4733 Obstructive sleep apnea (adult) (pediatric): Secondary | ICD-10-CM | POA: Insufficient documentation

## 2018-08-27 DIAGNOSIS — Z7984 Long term (current) use of oral hypoglycemic drugs: Secondary | ICD-10-CM | POA: Diagnosis not present

## 2018-08-27 DIAGNOSIS — M86171 Other acute osteomyelitis, right ankle and foot: Secondary | ICD-10-CM | POA: Diagnosis not present

## 2018-08-27 DIAGNOSIS — I10 Essential (primary) hypertension: Secondary | ICD-10-CM | POA: Insufficient documentation

## 2018-08-27 DIAGNOSIS — M86671 Other chronic osteomyelitis, right ankle and foot: Secondary | ICD-10-CM

## 2018-08-27 DIAGNOSIS — Z9889 Other specified postprocedural states: Secondary | ICD-10-CM

## 2018-08-27 DIAGNOSIS — J439 Emphysema, unspecified: Secondary | ICD-10-CM | POA: Diagnosis not present

## 2018-08-27 DIAGNOSIS — Z9989 Dependence on other enabling machines and devices: Secondary | ICD-10-CM | POA: Diagnosis not present

## 2018-08-27 DIAGNOSIS — Z7901 Long term (current) use of anticoagulants: Secondary | ICD-10-CM | POA: Insufficient documentation

## 2018-08-27 DIAGNOSIS — I480 Paroxysmal atrial fibrillation: Secondary | ICD-10-CM | POA: Insufficient documentation

## 2018-08-27 DIAGNOSIS — F17210 Nicotine dependence, cigarettes, uncomplicated: Secondary | ICD-10-CM | POA: Diagnosis not present

## 2018-08-27 DIAGNOSIS — E1142 Type 2 diabetes mellitus with diabetic polyneuropathy: Secondary | ICD-10-CM | POA: Insufficient documentation

## 2018-08-27 HISTORY — PX: AMPUTATION TOE: SHX6595

## 2018-08-27 HISTORY — DX: Personal history of urinary calculi: Z87.442

## 2018-08-27 LAB — GLUCOSE, CAPILLARY: Glucose-Capillary: 139 mg/dL — ABNORMAL HIGH (ref 70–99)

## 2018-08-27 LAB — PROTIME-INR
INR: 1.32
Prothrombin Time: 16.2 seconds — ABNORMAL HIGH (ref 11.4–15.2)

## 2018-08-27 SURGERY — AMPUTATION, TOE
Anesthesia: Monitor Anesthesia Care | Site: Toe | Laterality: Right

## 2018-08-27 MED ORDER — ONDANSETRON HCL 4 MG/2ML IJ SOLN
INTRAMUSCULAR | Status: AC
  Filled 2018-08-27: qty 2

## 2018-08-27 MED ORDER — LACTATED RINGERS IV SOLN
INTRAVENOUS | Status: DC | PRN
  Administered 2018-08-27: 15:00:00 via INTRAVENOUS

## 2018-08-27 MED ORDER — MIDAZOLAM HCL 2 MG/2ML IJ SOLN
INTRAMUSCULAR | Status: AC
  Filled 2018-08-27: qty 2

## 2018-08-27 MED ORDER — LACTATED RINGERS IV SOLN: INTRAVENOUS | Status: DC

## 2018-08-27 MED ORDER — ESMOLOL HCL 100 MG/10ML IV SOLN
INTRAVENOUS | Status: AC
  Filled 2018-08-27: qty 10

## 2018-08-27 MED ORDER — BUPIVACAINE HCL 0.5 % IJ SOLN
INTRAMUSCULAR | Status: DC | PRN
  Administered 2018-08-27: 10 mL

## 2018-08-27 MED ORDER — PROPOFOL 500 MG/50ML IV EMUL
INTRAVENOUS | Status: DC | PRN
  Administered 2018-08-27: 50 ug/kg/min via INTRAVENOUS

## 2018-08-27 MED ORDER — LIDOCAINE 2% (20 MG/ML) 5 ML SYRINGE
INTRAMUSCULAR | Status: DC | PRN
  Administered 2018-08-27: 20 mg via INTRAVENOUS

## 2018-08-27 MED ORDER — CEFAZOLIN SODIUM 1 G IJ SOLR
INTRAMUSCULAR | Status: AC
  Filled 2018-08-27: qty 40

## 2018-08-27 MED ORDER — ONDANSETRON HCL 4 MG/2ML IJ SOLN
INTRAMUSCULAR | Status: DC | PRN
  Administered 2018-08-27: 4 mg via INTRAVENOUS

## 2018-08-27 MED ORDER — DEXTROSE 5 % IV SOLN
INTRAVENOUS | Status: DC | PRN
  Administered 2018-08-27: 3 g via INTRAVENOUS

## 2018-08-27 MED ORDER — VANCOMYCIN HCL 500 MG IV SOLR
INTRAVENOUS | Status: AC
  Filled 2018-08-27: qty 500

## 2018-08-27 MED ORDER — ROCURONIUM BROMIDE 50 MG/5ML IV SOSY
PREFILLED_SYRINGE | INTRAVENOUS | Status: AC
  Filled 2018-08-27: qty 5

## 2018-08-27 MED ORDER — PROPOFOL 10 MG/ML IV BOLUS
INTRAVENOUS | Status: DC | PRN
  Administered 2018-08-27: 50 mg via INTRAVENOUS
  Administered 2018-08-27 (×2): 20 mg via INTRAVENOUS
  Administered 2018-08-27: 30 mg via INTRAVENOUS

## 2018-08-27 MED ORDER — FENTANYL CITRATE (PF) 250 MCG/5ML IJ SOLN
INTRAMUSCULAR | Status: AC
  Filled 2018-08-27: qty 5

## 2018-08-27 MED ORDER — FENTANYL CITRATE (PF) 250 MCG/5ML IJ SOLN
INTRAMUSCULAR | Status: DC | PRN
  Administered 2018-08-27: 50 ug via INTRAVENOUS
  Administered 2018-08-27: 25 ug via INTRAVENOUS

## 2018-08-27 MED ORDER — 0.9 % SODIUM CHLORIDE (POUR BTL) OPTIME
TOPICAL | Status: DC | PRN
  Administered 2018-08-27: 1000 mL

## 2018-08-27 MED ORDER — SUCCINYLCHOLINE CHLORIDE 200 MG/10ML IV SOSY
PREFILLED_SYRINGE | INTRAVENOUS | Status: AC
  Filled 2018-08-27: qty 10

## 2018-08-27 MED ORDER — PHENYLEPHRINE 40 MCG/ML (10ML) SYRINGE FOR IV PUSH (FOR BLOOD PRESSURE SUPPORT)
PREFILLED_SYRINGE | INTRAVENOUS | Status: AC
  Filled 2018-08-27: qty 20

## 2018-08-27 MED ORDER — VANCOMYCIN HCL 1 G IV SOLR
INTRAVENOUS | Status: DC | PRN
  Administered 2018-08-27: 500 mg via TOPICAL

## 2018-08-27 MED ORDER — BUPIVACAINE HCL (PF) 0.5 % IJ SOLN
INTRAMUSCULAR | Status: AC
  Filled 2018-08-27: qty 30

## 2018-08-27 MED ORDER — HYDROCODONE-ACETAMINOPHEN 5-325 MG PO TABS: 1.0000 | ORAL_TABLET | Freq: Two times a day (BID) | ORAL | 0 refills | Status: AC | PRN

## 2018-08-27 MED ORDER — DOXYCYCLINE HYCLATE 100 MG PO TABS: 100.0000 mg | ORAL_TABLET | Freq: Two times a day (BID) | ORAL | 0 refills | Status: DC

## 2018-08-27 MED ORDER — EPHEDRINE 5 MG/ML INJ
INTRAVENOUS | Status: AC
  Filled 2018-08-27: qty 10

## 2018-08-27 SURGICAL SUPPLY — 33 items
BANDAGE ACE 4X5 VEL STRL LF (GAUZE/BANDAGES/DRESSINGS) ×3 IMPLANT
BNDG GAUZE ELAST 4 BULKY (GAUZE/BANDAGES/DRESSINGS) ×3 IMPLANT
COVER SURGICAL LIGHT HANDLE (MISCELLANEOUS) ×3 IMPLANT
COVER WAND RF STERILE (DRAPES) ×1 IMPLANT
DRSG EMULSION OIL 3X3 NADH (GAUZE/BANDAGES/DRESSINGS) ×3 IMPLANT
DRSG XEROFORM 1X8 (GAUZE/BANDAGES/DRESSINGS) ×2 IMPLANT
ELECT CAUTERY BLADE 6.4 (BLADE) ×1 IMPLANT
ELECT REM PT RETURN 9FT ADLT (ELECTROSURGICAL)
ELECTRODE REM PT RTRN 9FT ADLT (ELECTROSURGICAL) ×1 IMPLANT
GAUZE SPONGE 4X4 12PLY STRL (GAUZE/BANDAGES/DRESSINGS) ×3 IMPLANT
GAUZE SPONGE 4X4 12PLY STRL LF (GAUZE/BANDAGES/DRESSINGS) ×2 IMPLANT
GLOVE BIO SURGEON STRL SZ7.5 (GLOVE) ×3 IMPLANT
GLOVE BIOGEL PI IND STRL 8 (GLOVE) ×1 IMPLANT
GLOVE BIOGEL PI INDICATOR 8 (GLOVE) ×2
GOWN STRL REUS W/ TWL LRG LVL3 (GOWN DISPOSABLE) ×1 IMPLANT
GOWN STRL REUS W/ TWL XL LVL3 (GOWN DISPOSABLE) ×1 IMPLANT
GOWN STRL REUS W/TWL LRG LVL3 (GOWN DISPOSABLE) ×3
GOWN STRL REUS W/TWL XL LVL3 (GOWN DISPOSABLE) ×3
KIT BASIN OR (CUSTOM PROCEDURE TRAY) ×3 IMPLANT
KIT TURNOVER KIT B (KITS) ×3 IMPLANT
NDL HYPO 25GX1X1/2 BEV (NEEDLE) ×1 IMPLANT
NEEDLE HYPO 25GX1X1/2 BEV (NEEDLE) ×3 IMPLANT
NS IRRIG 1000ML POUR BTL (IV SOLUTION) ×3 IMPLANT
PACK ORTHO EXTREMITY (CUSTOM PROCEDURE TRAY) ×3 IMPLANT
PAD ARMBOARD 7.5X6 YLW CONV (MISCELLANEOUS) ×3 IMPLANT
SOL PREP POV-IOD 4OZ 10% (MISCELLANEOUS) ×3 IMPLANT
STAPLER VISISTAT (STAPLE) ×2 IMPLANT
SUT ETHILON 3 0 PS 1 (SUTURE) ×3 IMPLANT
SYR CONTROL 10ML LL (SYRINGE) ×3 IMPLANT
TOWEL OR 17X26 10 PK STRL BLUE (TOWEL DISPOSABLE) ×3 IMPLANT
TUBE CONNECTING 12'X1/4 (SUCTIONS) ×1
TUBE CONNECTING 12X1/4 (SUCTIONS) ×2 IMPLANT
YANKAUER SUCT BULB TIP NO VENT (SUCTIONS) ×3 IMPLANT

## 2018-08-27 NOTE — Anesthesia Preprocedure Evaluation (Signed)
Anesthesia Evaluation  Patient identified by MRN, date of birth, ID band Patient awake    Reviewed: Allergy & Precautions, H&P , NPO status , Patient's Chart, lab work & pertinent test results  Airway Mallampati: II   Neck ROM: full    Dental   Pulmonary shortness of breath, sleep apnea , COPD, former smoker,    breath sounds clear to auscultation       Cardiovascular hypertension, + angina +CHF and + DOE  + dysrhythmias Atrial Fibrillation  Rhythm:regular Rate:Normal     Neuro/Psych  Headaches,    GI/Hepatic hiatal hernia, GERD  ,  Endo/Other  diabetes, Type 2  Renal/GU Renal InsufficiencyRenal disease     Musculoskeletal  (+) Arthritis ,   Abdominal   Peds  Hematology   Anesthesia Other Findings   Reproductive/Obstetrics                             Anesthesia Physical Anesthesia Plan  ASA: III  Anesthesia Plan: MAC   Post-op Pain Management:    Induction: Intravenous  PONV Risk Score and Plan: 1 and Propofol infusion, Treatment may vary due to age or medical condition, Midazolam and Ondansetron  Airway Management Planned: Simple Face Mask  Additional Equipment:   Intra-op Plan:   Post-operative Plan:   Informed Consent: I have reviewed the patients History and Physical, chart, labs and discussed the procedure including the risks, benefits and alternatives for the proposed anesthesia with the patient or authorized representative who has indicated his/her understanding and acceptance.       Plan Discussed with: CRNA, Anesthesiologist and Surgeon  Anesthesia Plan Comments:         Anesthesia Quick Evaluation

## 2018-08-27 NOTE — Op Note (Signed)
Patient Name: [name] DOB: Jun 18, 1946  MRN: [id number]   Date of Service: 08/27/2018  Surgeon: Dr. Hardie Pulley, DPM Assistants: None Pre-operative Diagnosis:   1) Osteomyelitis Right 2nd toe Post-operative Diagnosis:   Same Procedures:  1) Amputation Right 2nd Toe - IPJ Pathology/Specimens: ID Type Source Tests Collected by Time Destination  1 : 2nd toe right foot Amputation Toe, Right SURGICAL PATHOLOGY Evelina Bucy, DPM 08/27/2018 1432    Anesthesia: MAC/local Hemostasis: * No tourniquets in log * Estimated Blood Loss:  Materials: * No implants in log * Medications: 10 ccs 0.5% Marcaine plain Complications: none  Indications for Procedure:  This is a 73 y.o. male with an ulcer of the right 2nd toe with exposed bone. It was discussed with the patient he would benefit from amputation for definitive treatment. All risks, benefits, and alternatives discussed. No guarantees given.   Procedure in Detail: Patient was identified in pre-operative holding area. Formal consent was signed and the right lower extremity was marked. Patient was brought back to the operating room. Anesthesia was induced. The extremity was prepped and draped in the usual sterile fashion. Timeout was taken to confirm patient name, laterality, and procedure prior to incision.   Attention was then directed to the right 2nd toe. A fish mouth incision was made about the middle aspect of the toe proximal to the ulceration. The collateral ligaments were freed and the toe was disarticulated at the PIPJ. The incision was copiously irrigated. Skin flaps were remodeled and closed with 3-0 nylon and skin staples.  The foot was then dressed with xeroform, 4x4, kerlix, ACE.Marland Kitchen Patient tolerated the procedure well.   Disposition: Following a period of post-operative monitoring, patient will be transferred home.Marland Kitchen

## 2018-08-27 NOTE — Interval H&P Note (Signed)
History and Physical Interval Note:  08/27/2018 2:08 PM  [name]  has presented today for surgery, with the diagnosis of DIABETIC ULCER  The various methods of treatment have been discussed with the patient and family. After consideration of risks, benefits and other options for treatment, the patient has consented to  Procedure(s): AMPUTATION TOE SECOND RIGHT (Right) as a surgical intervention .  The patient's history has been reviewed, patient examined, no change in status, stable for surgery.  I have reviewed the patient's chart and labs.  Questions were answered to the patient's satisfaction.     Park Liter

## 2018-08-27 NOTE — Transfer of Care (Signed)
Immediate Anesthesia Transfer of Care Note  Patient: [name]  Procedure(s) Performed: AMPUTATION TOE SECOND RIGHT (Right Toe)  Patient Location: PACU  Anesthesia Type:MAC  Level of Consciousness: awake, alert  and oriented  Airway & Oxygen Therapy: Patient Spontanous Breathing and Patient connected to face mask oxygen  Post-op Assessment: Report given to RN and Post -op Vital signs reviewed and stable  Post vital signs: Reviewed and stable  Last Vitals:  Vitals Value Taken Time  BP 105/46   Temp    Pulse 57   Resp 15   SpO2 100     Last Pain:  Vitals:   08/27/18 1218  TempSrc: Oral         Complications: No apparent anesthesia complications

## 2018-08-28 NOTE — Anesthesia Postprocedure Evaluation (Signed)
Anesthesia Post Note  Patient: [name]  Procedure(s) Performed: AMPUTATION TOE SECOND RIGHT (Right Toe)     Patient location during evaluation: PACU Anesthesia Type: MAC Level of consciousness: awake and alert Pain management: pain level controlled Vital Signs Assessment: post-procedure vital signs reviewed and stable Respiratory status: spontaneous breathing, nonlabored ventilation, respiratory function stable and patient connected to nasal cannula oxygen Cardiovascular status: stable and blood pressure returned to baseline Postop Assessment: no apparent nausea or vomiting Anesthetic complications: no    Last Vitals:  Vitals:   08/27/18 1550 08/27/18 1552  BP: (!) 110/42 (!) 116/46  Pulse: (!) 54 (!) 56  Resp: 12 16  Temp: (!) 36.4 C   SpO2: 100% 100%    Last Pain:  Vitals:   08/27/18 1552  TempSrc:   PainSc: 0-No pain                 [name] S

## 2018-08-29 ENCOUNTER — Encounter (HOSPITAL_COMMUNITY): Payer: Self-pay | Admitting: Podiatry

## 2018-09-02 ENCOUNTER — Ambulatory Visit (INDEPENDENT_AMBULATORY_CARE_PROVIDER_SITE_OTHER): Payer: Self-pay | Admitting: Podiatry

## 2018-09-02 DIAGNOSIS — Z09 Encounter for follow-up examination after completed treatment for conditions other than malignant neoplasm: Secondary | ICD-10-CM

## 2018-09-02 DIAGNOSIS — L97514 Non-pressure chronic ulcer of other part of right foot with necrosis of bone: Secondary | ICD-10-CM

## 2018-09-02 DIAGNOSIS — E11621 Type 2 diabetes mellitus with foot ulcer: Secondary | ICD-10-CM

## 2018-09-02 DIAGNOSIS — M8580 Other specified disorders of bone density and structure, unspecified site: Secondary | ICD-10-CM

## 2018-09-09 ENCOUNTER — Ambulatory Visit (INDEPENDENT_AMBULATORY_CARE_PROVIDER_SITE_OTHER): Payer: Self-pay | Admitting: Podiatry

## 2018-09-09 DIAGNOSIS — Z09 Encounter for follow-up examination after completed treatment for conditions other than malignant neoplasm: Secondary | ICD-10-CM

## 2018-09-09 DIAGNOSIS — E11621 Type 2 diabetes mellitus with foot ulcer: Secondary | ICD-10-CM

## 2018-09-09 DIAGNOSIS — L97514 Non-pressure chronic ulcer of other part of right foot with necrosis of bone: Secondary | ICD-10-CM

## 2018-09-11 NOTE — Progress Notes (Signed)
Subjective:  Patient ID: [name], male    DOB: May 22, 1946,  MRN: [id number]  Chief Complaint  Patient presents with  . Routine Post Op     dos 02.14.2020 Amputation Toe Interphalangeal 2nd Rt     DOS: 08/27/2018 Procedure: Partial amputation right second toe  73 y.o. male returns for post-op check.  Doing well denies pain or issues  Review of Systems: Negative except as noted in the HPI. Denies N/V/F/Ch.  Past Medical History:  Diagnosis Date  . Bilateral lower extremity edema    per pt wears support hose  . Chronic anticoagulation    xarelto  (followed by dr Swazilandjordan)  . Chronic chest pain   . Chronic lower back pain   . Chronic venous insufficiency   . COPD with emphysema (HCC)   . DOE (dyspnea on exertion)   . GERD (gastroesophageal reflux disease)   . Headache    "couple times/week" (06/17/2018)  . Hiatal hernia   . History of gout    per pt last flare-up 2013  approx.  Marland Kitchen. History of kidney stones   . HTN (hypertension)   . Lower urinary tract symptoms (LUTS)    urologist-- dr Vernie Ammonsottelin  . OSA on CPAP   . PAF (paroxysmal atrial fibrillation) [facility](HCC)    cardiologist-- dr Swazilandjordan  . Peripheral neuropathy   . Pneumonia   . Rheumatoid arthritis (HCC)    followed by pcp  . Type II diabetes mellitus (HCC)    followed by pcp  . Wears dentures    full upper;  lower partial  . Wears glasses     Current Outpatient Medications:  .  acetaminophen (TYLENOL) 500 MG tablet, Take 1,000-1,500 mg by mouth daily as needed for moderate pain or headache., Disp: , Rfl:  .  amiodarone (PACERONE) 200 MG tablet, TAKE 1 TABLET BY MOUTH  DAILY, Disp: 90 tablet, Rfl: 1 .  ANORO ELLIPTA 62.5-25 MCG/INH AEPB, INHALE 1 PUFF INTO THE LUNGS DAILY., Disp: 60 each, Rfl: 4 .  atenolol (TENORMIN) 50 MG tablet, TAKE ONE TABLET BY MOUTH TWICE A DAY, Disp: 180 tablet, Rfl: 2 .  benazepril-hydrochlorthiazide (LOTENSIN HCT) 10-12.5 MG tablet, HOLD UNTIL SEEN BY YOUR PCP, Disp: , Rfl:  .  benzonatate  (TESSALON PERLES) 100 MG capsule, Take 1 capsule (100 mg total) by mouth every 6 (six) hours as needed for cough., Disp: 30 capsule, Rfl: 0 .  diltiazem (CARDIZEM CD) 180 MG 24 hr capsule, TAKE 1 CAPSULE BY MOUTH  DAILY, Disp: 90 capsule, Rfl: 1 .  doxycycline (VIBRA-TABS) 100 MG tablet, Take 1 tablet (100 mg total) by mouth 2 (two) times daily., Disp: 20 tablet, Rfl: 0 .  furosemide (LASIX) 40 MG tablet, Take 40 mg by mouth daily. , Disp: , Rfl:  .  guaiFENesin (MUCINEX) 600 MG 12 hr tablet, Take 1 tablet (600 mg total) by mouth 2 (two) times daily as needed for to loosen phlegm., Disp: 30 tablet, Rfl: 0 .  HYDROcodone-acetaminophen (NORCO) 5-325 MG tablet, Take 1 tablet by mouth 2 (two) times daily as needed for moderate pain., Disp: 20 tablet, Rfl: 0 .  metFORMIN (GLUCOPHAGE-XR) 500 MG 24 hr tablet, Take 1,000 mg by mouth 2 (two) times daily. , Disp: , Rfl:  .  omeprazole (PRILOSEC) 20 MG capsule, TAKE 1 CAPSULE BY MOUTH  DAILY, Disp: 90 capsule, Rfl: 1 .  potassium chloride SA (K-DUR,KLOR-CON) 20 MEQ tablet, TAKE ONE TABLET BY MOUTH TWO TIMES A DAY, Disp: 180 tablet, Rfl: 3 .  PROAIR HFA 108 (90 Base) MCG/ACT inhaler, INHALE TWO PUFFS BY MOUTH EVERY 4 HOURS AS NEEDED FOR WHEEZING AND  SHORTNESS OF BREATH, Disp: 8.5 each, Rfl: 2 .  rosuvastatin (CRESTOR) 5 MG tablet, Take 5 mg by mouth every evening. , Disp: , Rfl:  .  XARELTO 20 MG TABS tablet, TAKE ONE TABLET BY MOUTH DAILY WITH SUPPER, Disp: 30 tablet, Rfl: 3  Social History   Tobacco Use  Smoking Status Former Smoker  . Packs/day: 2.00  . Years: 30.00  . Pack years: 60.00  . Types: Cigarettes  . Last attempt to quit: 03/14/1996  . Years since quitting: 22.5  Smokeless Tobacco Never Used    Allergies  Allergen Reactions  . Other Rash    Silk tape    Objective:  There were no vitals filed for this visit. There is no height or weight on file to calculate BMI. Constitutional Well developed. Well nourished.  Vascular Foot warm and  well perfused. Capillary refill normal to all digits.   Neurologic Normal speech. Oriented to person, place, and time. Epicritic sensation to light touch grossly present bilaterally.  Dermatologic Skin healing well without signs of infection. Skin edges well coapted without signs of infection.  Orthopedic: Tenderness to palpation noted about the surgical site.   Radiographs: None Assessment:   1. Diabetic ulcer of toe of right foot associated with type 2 diabetes mellitus, with necrosis of bone (HCC)   2. Bone erosion determined by x-ray   3. Surgery follow-up    Plan:  Patient was evaluated and treated and all questions answered.  S/p foot surgery right -Progressing as expected post-operatively. -XR: None -WB Status: Weight-bear as tolerated in surgical shoe -Sutures: Intact. -Medications: None -Foot redressed.  Return for keep current post op appt, [name] patient.

## 2018-09-11 NOTE — Progress Notes (Signed)
Subjective:  Patient ID: [name], male    DOB: 12/03/1945,  MRN: [id number]  Chief Complaint  Patient presents with  . Routine Post [facility] 02.14.2020 Amputation Toe Interphalangeal 2nd Rt.     DOS: 08/27/2018 Procedure: Right second toe amputation  73 y.o. male returns for post-op check.  Doing well denies pain or discomfort  Review of Systems: Negative except as noted in the HPI. Denies N/V/F/Ch.  Past Medical History:  Diagnosis Date  . Bilateral lower extremity edema    per pt wears support hose  . Chronic anticoagulation    xarelto  (followed by dr Swaz[place]and)  . Chronic chest pain   . Chronic lower back pain   . Chronic venous insufficiency   . COPD with emphysema (HCC)   . DOE (dyspnea on exertion)   . GERD (gastroesophageal reflux disease)   . Headache    "couple times/week" (06/17/2018)  . Hiatal hernia   . History of gout    per pt last flare-up 2013  approx.  Marland Kitchen History of kidney stones   . HTN (hypertension)   . Lower urinary tract symptoms (LUTS)    urologist-- dr Vernie Ammons  . OSA on CPAP   . PAF (paroxysmal atrial fibrillation) [facility])    cardiologist-- dr Swaziland  . Peripheral neuropathy   . Pneumonia   . Rheumatoid arthritis (HCC)    followed by pcp  . Type II diabetes mellitus (HCC)    followed by pcp  . Wears dentures    full upper;  lower partial  . Wears glasses     Current Outpatient Medications:  .  acetaminophen (TYLENOL) 500 MG tablet, Take 1,000-1,500 mg by mouth daily as needed for moderate pain or headache., Disp: , Rfl:  .  amiodarone (PACERONE) 200 MG tablet, TAKE 1 TABLET BY MOUTH  DAILY, Disp: 90 tablet, Rfl: 1 .  ANORO ELLIPTA 62.5-25 MCG/INH AEPB, INHALE 1 PUFF INTO THE LUNGS DAILY., Disp: 60 each, Rfl: 4 .  atenolol (TENORMIN) 50 MG tablet, TAKE ONE TABLET BY MOUTH TWICE A DAY, Disp: 180 tablet, Rfl: 2 .  benazepril-hydrochlorthiazide (LOTENSIN HCT) 10-12.5 MG tablet, HOLD UNTIL SEEN BY YOUR PCP, Disp: , Rfl:  .  benzonatate  (TESSALON PERLES) 100 MG capsule, Take 1 capsule (100 mg total) by mouth every 6 (six) hours as needed for cough., Disp: 30 capsule, Rfl: 0 .  diltiazem (CARDIZEM CD) 180 MG 24 hr capsule, TAKE 1 CAPSULE BY MOUTH  DAILY, Disp: 90 capsule, Rfl: 1 .  doxycycline (VIBRA-TABS) 100 MG tablet, Take 1 tablet (100 mg total) by mouth 2 (two) times daily., Disp: 20 tablet, Rfl: 0 .  furosemide (LASIX) 40 MG tablet, Take 40 mg by mouth daily. , Disp: , Rfl:  .  guaiFENesin (MUCINEX) 600 MG 12 hr tablet, Take 1 tablet (600 mg total) by mouth 2 (two) times daily as needed for to loosen phlegm., Disp: 30 tablet, Rfl: 0 .  HYDROcodone-acetaminophen (NORCO) 5-325 MG tablet, Take 1 tablet by mouth 2 (two) times daily as needed for moderate pain., Disp: 20 tablet, Rfl: 0 .  metFORMIN (GLUCOPHAGE-XR) 500 MG 24 hr tablet, Take 1,000 mg by mouth 2 (two) times daily. , Disp: , Rfl:  .  omeprazole (PRILOSEC) 20 MG capsule, TAKE 1 CAPSULE BY MOUTH  DAILY, Disp: 90 capsule, Rfl: 1 .  potassium chloride SA (K-DUR,KLOR-CON) 20 MEQ tablet, TAKE ONE TABLET BY MOUTH TWO TIMES A DAY, Disp: 180 tablet, Rfl: 3 .  PROAIR HFA 108 (90 Base) MCG/ACT inhaler, INHALE TWO PUFFS BY MOUTH EVERY 4 HOURS AS NEEDED FOR WHEEZING AND  SHORTNESS OF BREATH, Disp: 8.5 each, Rfl: 2 .  rosuvastatin (CRESTOR) 5 MG tablet, Take 5 mg by mouth every evening. , Disp: , Rfl:  .  XARELTO 20 MG TABS tablet, TAKE ONE TABLET BY MOUTH DAILY WITH SUPPER, Disp: 30 tablet, Rfl: 3  Social History   Tobacco Use  Smoking Status Former Smoker  . Packs/day: 2.00  . Years: 30.00  . Pack years: 60.00  . Types: Cigarettes  . Last attempt to quit: 03/14/1996  . Years since quitting: 22.5  Smokeless Tobacco Never Used    Allergies  Allergen Reactions  . Other Rash    Silk tape    Objective:  There were no vitals filed for this visit. There is no height or weight on file to calculate BMI. Constitutional Well developed. Well nourished.  Vascular Foot warm and  well perfused. Capillary refill normal to all digits.   Neurologic Normal speech. Oriented to person, place, and time. Epicritic sensation to light touch grossly present bilaterally.  Dermatologic Skin healing without signs of infection slight wound fibrosis incomplete healing  Orthopedic: Tenderness to palpation noted about the surgical site.   Radiographs: None Assessment:   1. Diabetic ulcer of toe of right foot associated with type 2 diabetes mellitus, with necrosis of bone (HCC)   2. Surgery follow-up    Plan:  Patient was evaluated and treated and all questions answered.  S/p foot surgery right -Progressing as expected post-operatively. -XR: None -WB Status: Weight-bear as tolerate in surgical shoe -Sutures: Removed today.  To apply antibiotic ointment Band-Aid daily. -Medications: None refilled -Foot redressed.  Return in about 1 week (around 09/16/2018) for price patient.

## 2018-09-13 NOTE — Progress Notes (Signed)
Subjective:  Patient ID: [name], male    DOB: [birth year],  MRN: [id number]  Chief Complaint  Patient presents with  . Wound Check    bilateral follow up: "doing alright; no new concerns"   DOS: 06/18/18 Procedure: Partial amputation left great toe  73 y.o. male returns for post-op check. History as above.  Thinks the toe is looking better since taking the abx.  Review of Systems: Negative except as noted in the HPI. Denies N/V/F/Ch.  Past Medical History:  Diagnosis Date  . Bilateral lower extremity edema    per pt wears support hose  . Chronic anticoagulation    xarelto  (followed by dr Swaz[place]and)  . Chronic chest pain   . Chronic lower back pain   . Chronic venous insufficiency   . COPD with emphysema (HCC)   . DOE (dyspnea on exertion)   . GERD (gastroesophageal reflux disease)   . Headache    "couple times/week" (06/17/2018)  . Hiatal hernia   . History of gout    per pt last flare-up 2013  approx.  Marland Kitchen History of kidney stones   . HTN (hypertension)   . Lower urinary tract symptoms (LUTS)    urologist-- dr Vernie Ammons  . OSA on CPAP   . PAF (paroxysmal atrial fibrillation) [facility])    cardiologist-- dr Swaziland  . Peripheral neuropathy   . Pneumonia   . Rheumatoid arthritis (HCC)    followed by pcp  . Type II diabetes mellitus (HCC)    followed by pcp  . Wears dentures    full upper;  lower partial  . Wears glasses     Current Outpatient Medications:  .  acetaminophen (TYLENOL) 500 MG tablet, Take 1,000-1,500 mg by mouth daily as needed for moderate pain or headache., Disp: , Rfl:  .  amiodarone (PACERONE) 200 MG tablet, TAKE 1 TABLET BY MOUTH  DAILY, Disp: 90 tablet, Rfl: 1 .  atenolol (TENORMIN) 50 MG tablet, TAKE ONE TABLET BY MOUTH TWICE A DAY, Disp: 180 tablet, Rfl: 2 .  benazepril-hydrochlorthiazide (LOTENSIN HCT) 10-12.5 MG tablet, HOLD UNTIL SEEN BY YOUR PCP, Disp: , Rfl:  .  benzonatate (TESSALON PERLES) 100 MG capsule, Take 1 capsule (100 mg total) by mouth  every 6 (six) hours as needed for cough., Disp: 30 capsule, Rfl: 0 .  diltiazem (CARDIZEM CD) 180 MG 24 hr capsule, TAKE 1 CAPSULE BY MOUTH  DAILY, Disp: 90 capsule, Rfl: 1 .  furosemide (LASIX) 40 MG tablet, Take 40 mg by mouth daily. , Disp: , Rfl:  .  guaiFENesin (MUCINEX) 600 MG 12 hr tablet, Take 1 tablet (600 mg total) by mouth 2 (two) times daily as needed for to loosen phlegm., Disp: 30 tablet, Rfl: 0 .  metFORMIN (GLUCOPHAGE-XR) 500 MG 24 hr tablet, Take 1,000 mg by mouth 2 (two) times daily. , Disp: , Rfl:  .  omeprazole (PRILOSEC) 20 MG capsule, TAKE 1 CAPSULE BY MOUTH  DAILY, Disp: 90 capsule, Rfl: 1 .  potassium chloride SA (K-DUR,KLOR-CON) 20 MEQ tablet, TAKE ONE TABLET BY MOUTH TWO TIMES A DAY, Disp: 180 tablet, Rfl: 3 .  PROAIR HFA 108 (90 Base) MCG/ACT inhaler, INHALE TWO PUFFS BY MOUTH EVERY 4 HOURS AS NEEDED FOR WHEEZING AND  SHORTNESS OF BREATH, Disp: 8.5 each, Rfl: 2 .  rosuvastatin (CRESTOR) 5 MG tablet, Take 5 mg by mouth every evening. , Disp: , Rfl:  .  XARELTO 20 MG TABS tablet, TAKE ONE TABLET BY MOUTH DAILY WITH SUPPER,  Disp: 30 tablet, Rfl: 3 .  ANORO ELLIPTA 62.5-25 MCG/INH AEPB, INHALE 1 PUFF INTO THE LUNGS DAILY., Disp: 60 each, Rfl: 4 .  doxycycline (VIBRA-TABS) 100 MG tablet, Take 1 tablet (100 mg total) by mouth 2 (two) times daily., Disp: 20 tablet, Rfl: 0 .  HYDROcodone-acetaminophen (NORCO) 5-325 MG tablet, Take 1 tablet by mouth 2 (two) times daily as needed for moderate pain., Disp: 20 tablet, Rfl: 0  Social History   Tobacco Use  Smoking Status Former Smoker  . Packs/day: 2.00  . Years: 30.00  . Pack years: 60.00  . Types: Cigarettes  . Last attempt to quit: 03/14/1996  . Years since quitting: 22.5  Smokeless Tobacco Never Used    Allergies  Allergen Reactions  . Other Rash    Silk tape    Objective:  There were no vitals filed for this visit. There is no height or weight on file to calculate BMI. Constitutional Well developed. Well  nourished.  Vascular Foot warm and well perfused. Capillary refill normal to all digits.   Neurologic Normal speech. Oriented to person, place, and time. Epicritic sensation to light touch grossly present bilaterally.  Dermatologic Skin well healed left hallux 0.3 cm ulceration R 2nd toe resolving erythema, no drainage.  Orthopedic: Tenderness to palpation noted about the surgical site.   Radiographs: None Assessment:   1. Post-operative state   2. Diabetic ulcer of toe of right foot associated with type 2 diabetes mellitus, with necrosis of bone (HCC)    Plan:  Patient was evaluated and treated and all questions answered.  S/p foot surgery left -Progressing as expected post-operatively. -XR: None -WB Status: WBAT in DM shoes -Sutures: out. Steri strips applied.. -Medications: none refilled. -Foot redressed.  R 2nd toe ulceration -Cellulitis resolving, exposed bone still evident. Continue with planned surgery tomorrow.  No follow-ups on file.

## 2018-09-16 ENCOUNTER — Encounter: Payer: Self-pay | Admitting: Podiatry

## 2018-09-16 ENCOUNTER — Ambulatory Visit (INDEPENDENT_AMBULATORY_CARE_PROVIDER_SITE_OTHER): Payer: Medicare Other | Admitting: Podiatry

## 2018-09-16 DIAGNOSIS — L97514 Non-pressure chronic ulcer of other part of right foot with necrosis of bone: Secondary | ICD-10-CM

## 2018-09-16 DIAGNOSIS — E11621 Type 2 diabetes mellitus with foot ulcer: Secondary | ICD-10-CM

## 2018-09-16 DIAGNOSIS — Z9889 Other specified postprocedural states: Secondary | ICD-10-CM

## 2018-09-16 NOTE — Progress Notes (Signed)
Subjective:  Patient ID: [name], male    DOB: Jul 07, 1946,  MRN: [id number]  Chief Complaint  Patient presents with  . Wound Check    R-2nd toe; "doing alright; no other concerns"    DOS: 08/27/2018 Procedure: Right second toe amputation  73 y.o. male returns for post-op check.  The toe is doing okay denies pain or issues  Review of Systems: Negative except as noted in the HPI. Denies N/V/F/Ch.  Past Medical History:  Diagnosis Date  . Bilateral lower extremity edema    per pt wears support hose  . Chronic anticoagulation    xarelto  (followed by dr [place]and)  . Chronic chest pain   . Chronic lower back pain   . Chronic venous insufficiency   . COPD with emphysema (HCC)   . DOE (dyspnea on exertion)   . GERD (gastroesophageal reflux disease)   . Headache    "couple times/week" (06/17/2018)  . Hiatal hernia   . History of gout    per pt last flare-up 2013  approx.  Marland Kitchen History of kidney stones   . HTN (hypertension)   . Lower urinary tract symptoms (LUTS)    urologist-- dr Vernie Ammons  . OSA on CPAP   . PAF (paroxysmal atrial fibrillation) [facility])    cardiologist-- dr Swaziland  . Peripheral neuropathy   . Pneumonia   . Rheumatoid arthritis (HCC)    followed by pcp  . Type II diabetes mellitus (HCC)    followed by pcp  . Wears dentures    full upper;  lower partial  . Wears glasses     Current Outpatient Medications:  .  acetaminophen (TYLENOL) 500 MG tablet, Take 1,000-1,500 mg by mouth daily as needed for moderate pain or headache., Disp: , Rfl:  .  amiodarone (PACERONE) 200 MG tablet, TAKE 1 TABLET BY MOUTH  DAILY, Disp: 90 tablet, Rfl: 1 .  ANORO ELLIPTA 62.5-25 MCG/INH AEPB, INHALE 1 PUFF INTO THE LUNGS DAILY., Disp: 60 each, Rfl: 4 .  atenolol (TENORMIN) 50 MG tablet, TAKE ONE TABLET BY MOUTH TWICE A DAY, Disp: 180 tablet, Rfl: 2 .  benazepril-hydrochlorthiazide (LOTENSIN HCT) 10-12.5 MG tablet, HOLD UNTIL SEEN BY YOUR PCP, Disp: , Rfl:  .  benzonatate (TESSALON  PERLES) 100 MG capsule, Take 1 capsule (100 mg total) by mouth every 6 (six) hours as needed for cough., Disp: 30 capsule, Rfl: 0 .  diltiazem (CARDIZEM CD) 180 MG 24 hr capsule, TAKE 1 CAPSULE BY MOUTH  DAILY, Disp: 90 capsule, Rfl: 1 .  doxycycline (VIBRA-TABS) 100 MG tablet, Take 1 tablet (100 mg total) by mouth 2 (two) times daily., Disp: 20 tablet, Rfl: 0 .  furosemide (LASIX) 40 MG tablet, Take 40 mg by mouth daily. , Disp: , Rfl:  .  guaiFENesin (MUCINEX) 600 MG 12 hr tablet, Take 1 tablet (600 mg total) by mouth 2 (two) times daily as needed for to loosen phlegm., Disp: 30 tablet, Rfl: 0 .  HYDROcodone-acetaminophen (NORCO) 5-325 MG tablet, Take 1 tablet by mouth 2 (two) times daily as needed for moderate pain., Disp: 20 tablet, Rfl: 0 .  metFORMIN (GLUCOPHAGE-XR) 500 MG 24 hr tablet, Take 1,000 mg by mouth 2 (two) times daily. , Disp: , Rfl:  .  omeprazole (PRILOSEC) 20 MG capsule, TAKE 1 CAPSULE BY MOUTH  DAILY, Disp: 90 capsule, Rfl: 1 .  potassium chloride SA (K-DUR,KLOR-CON) 20 MEQ tablet, TAKE ONE TABLET BY MOUTH TWO TIMES A DAY, Disp: 180 tablet, Rfl: 3 .  PROAIR HFA 108 (90 Base) MCG/ACT inhaler, INHALE TWO PUFFS BY MOUTH EVERY 4 HOURS AS NEEDED FOR WHEEZING AND  SHORTNESS OF BREATH, Disp: 8.5 each, Rfl: 2 .  rosuvastatin (CRESTOR) 5 MG tablet, Take 5 mg by mouth every evening. , Disp: , Rfl:  .  XARELTO 20 MG TABS tablet, TAKE ONE TABLET BY MOUTH DAILY WITH SUPPER, Disp: 30 tablet, Rfl: 3  Social History   Tobacco Use  Smoking Status Former Smoker  . Packs/day: 2.00  . Years: 30.00  . Pack years: 60.00  . Types: Cigarettes  . Last attempt to quit: 03/14/1996  . Years since quitting: 22.5  Smokeless Tobacco Never Used    Allergies  Allergen Reactions  . Other Rash    Silk tape    Objective:  There were no vitals filed for this visit. There is no height or weight on file to calculate BMI. Constitutional Well developed. Well nourished.  Vascular Foot warm and well  perfused. Capillary refill normal to all digits.   Neurologic Normal speech. Oriented to person, place, and time. Epicritic sensation to light touch grossly present bilaterally.  Dermatologic Skin healing slight area of incomplete healing distally without exposed bone and with granular base  Orthopedic: Tenderness to palpation noted about the surgical site.   Radiographs: None Assessment:   1. Diabetic ulcer of toe of right foot associated with type 2 diabetes mellitus, with necrosis of bone (HCC)   2. Post-operative state    Plan:  Patient was evaluated and treated and all questions answered.  S/p foot surgery right -Progressing as expected post-operatively. -XR: None -WB Status: Weight-bear as tolerate in surgical shoe -Wound base cauterized with silver nitrate to promote epithelialization -Continue antibiotic ointment daily to the wound base until fully healed -Medications: None refilled -Foot redressed.  Return in about 2 weeks (around 09/30/2018) for Post-op.

## 2018-09-26 ENCOUNTER — Other Ambulatory Visit: Payer: Self-pay | Admitting: Cardiology

## 2018-10-07 ENCOUNTER — Ambulatory Visit: Payer: Medicare Other | Admitting: Podiatry

## 2018-11-20 ENCOUNTER — Other Ambulatory Visit: Payer: Self-pay | Admitting: Cardiology

## 2018-11-24 ENCOUNTER — Other Ambulatory Visit: Payer: Self-pay | Admitting: Cardiology

## 2018-12-17 ENCOUNTER — Other Ambulatory Visit: Payer: Self-pay | Admitting: Cardiology

## 2019-01-23 ENCOUNTER — Other Ambulatory Visit: Payer: Self-pay | Admitting: Emergency Medicine

## 2019-02-15 ENCOUNTER — Ambulatory Visit: Payer: Medicare Other | Admitting: Emergency Medicine

## 2019-02-15 ENCOUNTER — Encounter: Payer: Self-pay | Admitting: Emergency Medicine

## 2019-02-15 ENCOUNTER — Other Ambulatory Visit: Payer: Self-pay

## 2019-02-15 ENCOUNTER — Ambulatory Visit (INDEPENDENT_AMBULATORY_CARE_PROVIDER_SITE_OTHER): Payer: Medicare Other

## 2019-02-15 VITALS — BP 146/68 | HR 66 | Ht 78.0 in | Wt 346.0 lb

## 2019-02-15 DIAGNOSIS — R0600 Dyspnea, unspecified: Secondary | ICD-10-CM

## 2019-02-15 LAB — COMPREHENSIVE METABOLIC PANEL
ALT: 14 U/L (ref 0–53)
AST: 14 U/L (ref 0–37)
Albumin: 4.1 g/dL (ref 3.5–5.2)
Alkaline Phosphatase: 61 U/L (ref 39–117)
BUN: 18 mg/dL (ref 6–23)
CO2: 21 mEq/L (ref 19–32)
Calcium: 9.6 mg/dL (ref 8.4–10.5)
Chloride: 106 mEq/L (ref 96–112)
Creatinine, Ser: 1.34 mg/dL (ref 0.40–1.50)
GFR: 52.26 mL/min — ABNORMAL LOW (ref >60.00)
Glucose, Bld: 178 mg/dL — ABNORMAL HIGH (ref 70–99)
Potassium: 4.5 mEq/L (ref 3.5–5.1)
Sodium: 136 mEq/L (ref 135–145)
Total Bilirubin: 0.4 mg/dL (ref 0.2–1.2)
Total Protein: 7.7 g/dL (ref 6.0–8.3)

## 2019-02-15 LAB — CBC WITH DIFFERENTIAL/PLATELET
Basophils Absolute: 0.2 10*3/uL — ABNORMAL HIGH (ref 0.0–0.1)
Basophils Relative: 1.9 % (ref 0.0–3.0)
Eosinophils Absolute: 0.4 10*3/uL (ref 0.0–0.7)
Eosinophils Relative: 3.2 % (ref 0.0–5.0)
HCT: 27.1 % — ABNORMAL LOW (ref 39.0–52.0)
Hemoglobin: 8.2 g/dL — ABNORMAL LOW (ref 13.0–17.0)
Lymphocytes Relative: 22.3 % (ref 12.0–46.0)
Lymphs Abs: 2.5 10*3/uL (ref 0.7–4.0)
MCHC: 30.2 g/dL (ref 30.0–36.0)
MCV: 80.3 fl (ref 78.0–100.0)
Monocytes Absolute: 1.6 10*3/uL — ABNORMAL HIGH (ref 0.1–1.0)
Monocytes Relative: 14.6 % — ABNORMAL HIGH (ref 3.0–12.0)
Neutro Abs: 6.4 10*3/uL (ref 1.4–7.7)
Neutrophils Relative %: 58 % (ref 43.0–77.0)
Platelets: 200 10*3/uL (ref 150.0–400.0)
RBC: 3.38 Mil/uL — ABNORMAL LOW (ref 4.22–5.81)
RDW: 19.4 % — ABNORMAL HIGH (ref 11.5–15.5)
WBC: 11.1 10*3/uL — ABNORMAL HIGH (ref 4.0–10.5)

## 2019-02-15 LAB — TSH: TSH: 4.19 u[IU]/mL (ref 0.35–4.50)

## 2019-02-15 LAB — BRAIN NATRIURETIC PEPTIDE: Pro B Natriuretic peptide (BNP): 132 pg/mL — ABNORMAL HIGH (ref 0.0–100.0)

## 2019-02-15 MED ORDER — STIOLTO RESPIMAT 2.5-2.5 MCG/ACT IN AERS: 2.0000 | INHALATION_SPRAY | Freq: Every day | RESPIRATORY_TRACT | 5 refills | Status: AC

## 2019-02-15 NOTE — Patient Instructions (Signed)
Lab work today. Chest x-ray today. Walking oximetry on room air today.  You may qualify for supplemental oxygen. We will try changing Anoro to Stiolto 2 puffs once daily to see if you get better delivery, better results. We will refill your albuterol. Keep albuterol available to use 2 puffs up to every 4 hours if needed for shortness of breath, chest tightness, wheezing.  Please increase your Lasix to 40 mg twice a day for the next 3 days.  After that go back to taking 40 mg once a day.  Try to work on taking this reliably every day. We may need to arrange for you to follow-up with Dr. Martinique with cardiology Continue to wear your BiPAP every night as you have been doing. Follow-up in 3 to 4 weeks with APP or Dr Lamonte Sakai

## 2019-02-15 NOTE — Assessment & Plan Note (Signed)
Progressive dyspnea for the last several months.  Differential diagnosis here is broad ranging all the way from some deconditioning during the period of isolation, weight gain, accumulating volume overload (he forgets his Lasix), progression of his COPD and ineffective delivery of his Anoro, active atrial fibrillation that he can perceive.  He also has rheumatoid arthritis, amiodarone use which could cause interstitial disease.  Need to start a work-up to tease out the potential contributors. -CBC, BNP, BMP today -Chest x-ray today -Walking oximetry today on room air.  He may qualify for supplemental oxygen. -We will try changing Anoro to Stiolto to see if he gets better delivery.  If he benefits we will continue -I will ask him to take his Lasix 40 mg twice a day for the next 3 days and then convert back to once daily. -Refill his albuterol -Follow-up here soon with either myself or APP.

## 2019-02-15 NOTE — Progress Notes (Signed)
  Subjective:    Patient ID: [name], male    DOB: 22-Oct-1945, 73 y.o.   MRN: [id number]   HPI  ROV 02/15/2019 --[name] is 73 and has obstructive sleep apnea for which he uses BiPAP.  Also COPD, rheumatoid arthritis, hypertension, atrial fibrillation (anticoagulated), diabetes. He has felt worse for over 2 months, more dyspnea. Has had a dry cough. No fever, no sick contacts. He gets a L mid chest cramping pain, can happen at rest.  Functional capacity has been down since isolation started. He is on Anoro, tough to get it inhaled well. He has been using frequent albuterol - 4x a day, then he ran out. He has gained 12 lbs since I last saw him. He has chronic LE edema, wears hose. He doesn't perceive that he is in A Fib frequently. On lasix 40 (misses it sometimes), amiodarone  Download info last 30 days, 77% usage > 4 hr, set on 18/14/PS4. Good compliance, good clinical benefit.   CBC, BNP, CXR today, walk, try change to stiolto, refill albuterol. Double up lasix x 3 days.   Objective:   Physical Exam Vitals:   02/15/19 0852  BP: (!) 146/68  Pulse: 66  SpO2: 97%  Weight: (!) 346 lb (156.9 kg)  Height: 6\' 6"  (1.981 m)   Gen: Pleasant, obese, in no distress,  normal affect  ENT: No lesions,  mouth clear,  oropharynx clear, no postnasal drip  Neck: No JVD, no stridor  Lungs: No use of accessory muscles, no wheeze, or crackles.   Cardiovascular: RRR, systolic M  Musculoskeletal: No deformities, no cyanosis or clubbing  Neuro: alert, non focal  Skin: Warm, no lesions or rashes   Assessment & Plan:  Dyspnea Progressive dyspnea for the last several months.  Differential diagnosis here is broad ranging all the way from some deconditioning during the period of isolation, weight gain, accumulating volume overload (he forgets his Lasix), progression of his COPD and ineffective delivery of his Anoro, active atrial fibrillation that he can perceive.  He also has rheumatoid arthritis,  amiodarone use which could cause interstitial disease.  Need to start a work-up to tease out the potential contributors. -CBC, BNP, BMP today -Chest x-ray today -Walking oximetry today on room air.  He may qualify for supplemental oxygen. -We will try changing Anoro to Stiolto to see if he gets better delivery.  If he benefits we will continue -I will ask him to take his Lasix 40 mg twice a day for the next 3 days and then convert back to once daily. -Refill his albuterol -Follow-up here soon with either myself or APP.  OSA (obstructive sleep apnea) Good compliance with BiPAP, tolerates well.  He is getting clinical benefit, less daytime sleepiness, no nighttime awakenings, no headache.  Baltazar Apo, MD, PhD 02/15/2019, 9:21 AM [facility] [phone] or if no answer [phone]

## 2019-02-15 NOTE — Assessment & Plan Note (Signed)
Good compliance with BiPAP, tolerates well.  He is getting clinical benefit, less daytime sleepiness, no nighttime awakenings, no headache.

## 2019-03-03 ENCOUNTER — Encounter: Payer: Self-pay | Admitting: Physician Assistant

## 2019-03-03 ENCOUNTER — Other Ambulatory Visit: Payer: Self-pay

## 2019-03-03 ENCOUNTER — Ambulatory Visit (INDEPENDENT_AMBULATORY_CARE_PROVIDER_SITE_OTHER): Payer: Medicare Other | Admitting: Physician Assistant

## 2019-03-03 VITALS — BP 110/50 | HR 63 | Temp 97.6°F | Ht 78.0 in | Wt 342.8 lb

## 2019-03-03 DIAGNOSIS — I480 Paroxysmal atrial fibrillation: Secondary | ICD-10-CM | POA: Diagnosis not present

## 2019-03-03 DIAGNOSIS — G4733 Obstructive sleep apnea (adult) (pediatric): Secondary | ICD-10-CM

## 2019-03-03 DIAGNOSIS — R0789 Other chest pain: Secondary | ICD-10-CM | POA: Diagnosis not present

## 2019-03-03 DIAGNOSIS — R0600 Dyspnea, unspecified: Secondary | ICD-10-CM

## 2019-03-03 DIAGNOSIS — I10 Essential (primary) hypertension: Secondary | ICD-10-CM

## 2019-03-03 DIAGNOSIS — D649 Anemia, unspecified: Secondary | ICD-10-CM

## 2019-03-03 DIAGNOSIS — E119 Type 2 diabetes mellitus without complications: Secondary | ICD-10-CM

## 2019-03-03 NOTE — Patient Instructions (Addendum)
Medication Instructions:  Your physician recommends that you continue on your current medications as directed. Please refer to the Current Medication list given to you today.   If you need a refill on your cardiac medications before your next appointment, please call your pharmacy.   Lab work: NONE ordered at this time of appointment   If you have labs (blood work) drawn today and your tests are completely normal, you will receive your results only by: Marland Kitchen MyChart Message (if you have MyChart) OR . A paper copy in the mail If you have any lab test that is abnormal or we need to change your treatment, we will call you to review the results.  Testing/Procedures: NONE ordered at this time of appointment   Follow-Up: At [facility], you and your health needs are our priority.  As part of our continuing mission to provide you with exceptional heart care, we have created designated Provider Care Teams.  These Care Teams include your primary Cardiologist (physician) and Advanced Practice Providers (APPs -  Physician Assistants and Nurse Practitioners) who all work together to provide you with the care you need, when you need it. You will need a follow up appointment in 3-4 months with Peter Martinique, MD or one of the following Advanced Practice Providers on your designated Care Team: [place], [place] . Fabian Sharp, PA-C  Any Other Special Instructions Will Be Listed Below (If Applicable).

## 2019-03-03 NOTE — Progress Notes (Signed)
Cardiology Office Note    Date:  03/05/2019   ID:  [name], DOB 1946/05/17, MRN [id number]  PCP:  Ricky [place], [facility]  Cardiologist: Dr. SwazilandJordan  Chief Complaint  Patient presents with  . Follow-up    seen for Dr. SwazilandJordan.    History of Present Illness:  [name] is a 73 y.o. male with past medical history of PAF on Xarelto and amiodarone, chronic chest pain with prior cath revealing normal coronaries in April 2018, chronic venous stasis, obstructive sleep apnea on BiPAP managed by pulmonology, hypertension, DM 2, iron deficiency anemia and COPD.  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  This was eventually completed on 08/27/2018.  More recently, patient was seen by Dr. Delton [name] of pulmonology service on 02/15/2019 for dyspnea.  His weight has gained 12 pounds.  He also complained of mild chest pain.  He was instructed to double up her Lasix for 3 days.  Patient presents today for cardiology office visit.  He is weight has decreased by 4 pounds however still 6 pounds heavier than his February weight.  He did continue to have intermittent chest discomfort however this mainly occurs at rest and only last a few minutes.  The frequency of the chest pain is about 1-2 episodes per month.  Given normal cardiac catheterization in 2018, I do not recommend any further work-up for the chest discomfort.  He is wearing compression stocking.  On exam, he appears to be euvolemic without any lower extremity edema or crackles in the lung.  He has baseline dyspnea on exertion and does not exercise much.  I do not think there is any sign of pulmonary fibrosis based on physical exam.  I suspect his dyspnea on exertion is related to sedentary lifestyle, obesity and anemia.  He has chronic anemia with baseline hemoglobin around 8.  He required blood transfusion in the past while  on Coumadin.  He still would notice a few hemorrhoid bleed after switch to Xarelto, however this has significantly improved.  Recent lab work shows hemoglobin down to 8.2 again.  I recommend he follow-up with his primary care provider for this.   Past Medical History:  Diagnosis Date  . Bilateral lower extremity edema    per pt wears support hose  . Chronic anticoagulation    xarelto  (followed by dr Swazilandjordan)  . Chronic chest pain   . Chronic lower back pain   . Chronic venous insufficiency   . COPD with emphysema (HCC)   . DOE (dyspnea on exertion)   . GERD (gastroesophageal reflux disease)   . Headache    "couple times/week" (06/17/2018)  . Hiatal hernia   . History of gout    per pt last flare-up 2013  approx.  Marland Kitchen. History of kidney stones   . HTN (hypertension)   . Lower urinary tract symptoms (LUTS)    urologist-- dr [name]  . OSA on CPAP   . PAF (paroxysmal atrial fibrillation) [facility](HCC)    cardiologist-- dr Swazilandjordan  . Peripheral neuropathy   . Pneumonia   . Rheumatoid arthritis (HCC)    followed by pcp  . Type II diabetes mellitus (HCC)    followed by pcp  . Wears dentures    full upper;  lower partial  . Wears glasses     Past Surgical History:  Procedure Laterality Date  .  AMPUTATION Right 07/23/2017   Procedure: right great toe amputation;  Surgeon: Eldred Manges, MD;  Location: [facility] ORS;  Service: Orthopedics;  Laterality: Right;  . AMPUTATION Left 06/18/2018   Procedure: AMPUTATION LEFT GREAT TOE INTERPHALYNGEAL JOINT;  Surgeon: Park Liter, DPM;  Location: [facility] OR;  Service: Podiatry;  Laterality: Left;  . AMPUTATION TOE Left 06/18/2018   Procedure: PROXIMAL PHALYNX RESECTION;  Surgeon: Park Liter, DPM;  Location: [facility] OR;  Service: Podiatry;  Laterality: Left;  . AMPUTATION TOE Right 08/27/2018   Procedure: AMPUTATION TOE SECOND RIGHT;  Surgeon: Park Liter, DPM;  Location: [facility] OR;  Service: Podiatry;  Laterality: Right;  . ATRIAL FIBRILLATION ABLATION  X 2    [place]; Durwin Nora  . BONE BIOPSY Left 06/18/2018   Procedure: BONE BIOPSY LEFT GREAT TOE;  Surgeon: Park Liter, DPM;  Location: [facility] OR;  Service: Podiatry;  Laterality: Left;  . CARDIAC CATHETERIZATION  1998   NORMAL  . COLONOSCOPY WITH PROPOFOL N/A 08/25/2016   Procedure: COLONOSCOPY WITH PROPOFOL;  Surgeon: Carman Ching, MD;  Location: [facility] ENDOSCOPY;  Service: Endoscopy;  Laterality: N/A;  . CYST EXCISION     "all over; back, arms; Dr. Magnus Ivan"  . HERNIA REPAIR    . INGUINAL HERNIA REPAIR Left   . LEFT HEART CATH AND CORONARY ANGIOGRAPHY N/A 10/17/2016   Procedure: Left Heart Cath and Coronary Angiography;  Surgeon: Peter M Swaziland, MD;  Location: [facility] INVASIVE CV LAB;  Service: Cardiovascular;  Laterality: N/A;  . PLEURAL SCARIFICATION    . TONSILLECTOMY    . UMBILICAL HERNIA REPAIR    . VEIN LIGATION AND STRIPPING      Current Medications: Outpatient Medications Prior to Visit  Medication Sig Dispense Refill  . acetaminophen (TYLENOL) 500 MG tablet Take 1,000-1,500 mg by mouth daily as needed for moderate pain or headache.    Marland Kitchen amiodarone (PACERONE) 200 MG tablet TAKE 1 TABLET BY MOUTH  DAILY 90 tablet 3  . ANORO ELLIPTA 62.5-25 MCG/INH AEPB INHALE ONE DOSE BY MOUTH DAILY 60 each 3  . atenolol (TENORMIN) 50 MG tablet TAKE ONE TABLET BY MOUTH TWICE A DAY 180 tablet 1  . benzonatate (TESSALON PERLES) 100 MG capsule Take 1 capsule (100 mg total) by mouth every 6 (six) hours as needed for cough. 30 capsule 0  . diltiazem (CARDIZEM CD) 180 MG 24 hr capsule TAKE 1 CAPSULE BY MOUTH  DAILY 90 capsule 3  . furosemide (LASIX) 40 MG tablet TAKE 1 TABLET BY MOUTH  DAILY 90 tablet 3  . guaiFENesin (MUCINEX) 600 MG 12 hr tablet Take 1 tablet (600 mg total) by mouth 2 (two) times daily as needed for to loosen phlegm. 30 tablet 0  . HYDROcodone-acetaminophen (NORCO) 5-325 MG tablet Take 1 tablet by mouth 2 (two) times daily as needed for moderate pain. 20 tablet 0  . metFORMIN (GLUCOPHAGE-XR) 500 MG  24 hr tablet Take 1,000 mg by mouth 2 (two) times daily.     Marland Kitchen omeprazole (PRILOSEC) 20 MG capsule TAKE 1 CAPSULE BY MOUTH  DAILY 90 capsule 3  . potassium chloride SA (K-DUR,KLOR-CON) 20 MEQ tablet TAKE ONE TABLET BY MOUTH TWO TIMES A DAY 180 tablet 2  . PROAIR HFA 108 (90 Base) MCG/ACT inhaler INHALE TWO PUFFS BY MOUTH EVERY 4 HOURS AS NEEDED FOR WHEEZING AND  SHORTNESS OF BREATH 8.5 each 2  . rosuvastatin (CRESTOR) 5 MG tablet Take 5 mg by mouth every evening.     Marland Kitchen spironolactone (ALDACTONE) 25  MG tablet TAKE 1 TABLET BY MOUTH  DAILY 90 tablet 3  . Tiotropium Bromide-Olodaterol (STIOLTO RESPIMAT) 2.5-2.5 MCG/ACT AERS Inhale 2 puffs into the lungs daily. 4 g 5  . XARELTO 20 MG TABS tablet TAKE ONE TABLET BY MOUTH DAILY WITH SUPPER 30 tablet 3  . benazepril-hydrochlorthiazide (LOTENSIN HCT) 10-12.5 MG tablet HOLD UNTIL SEEN BY YOUR PCP (Patient not taking: Reported on 03/03/2019)    . hydrochlorothiazide (HYDRODIURIL) 25 MG tablet TAKE 1 TABLET BY MOUTH  DAILY (Patient not taking: Reported on 03/03/2019) 90 tablet 3   No facility-administered medications prior to visit.      Allergies:   Other   Social History   Socioeconomic History  . Marital status: Married    Spouse name: Not on file  . Number of children: 3  . Years of education: Not on file  . Highest education level: Not on file  Occupational History  . Occupation: Occupational psychologist: [employer]  Social Needs  . Financial resource strain: Not on file  . Food insecurity    Worry: Not on file    Inability: Not on file  . Transportation needs    Medical: Not on file    Non-medical: Not on file  Tobacco Use  . Smoking status: Former Smoker    Packs/day: 2.00    Years: 30.00    Pack years: 60.00    Types: Cigarettes    Quit date: 03/14/1996    Years since quitting: 22.9  . Smokeless tobacco: Never Used  Substance and Sexual Activity  . Alcohol use: Yes    Comment: occasional  . Drug use: No  . Sexual activity:  Not Currently  Lifestyle  . Physical activity    Days per week: Not on file    Minutes per session: Not on file  . Stress: Not on file  Relationships  . Social Musician on phone: Not on file    Gets together: Not on file    Attends religious service: Not on file    Active member of club or organization: Not on file    Attends meetings of clubs or organizations: Not on file    Relationship status: Not on file  Other Topics Concern  . Not on file  Social History Narrative  . Not on file     Family History:  The patient's family history includes ALS in his sister; Bone cancer in his mother; Coronary artery disease (age of onset: 10) in his brother; Heart failure (age of onset: 88) in his brother; Other in his father; Rheum arthritis in his brother, brother, and mother.   ROS:   Please see the history of present illness.    ROS All other systems reviewed and are negative.   PHYSICAL EXAM:   VS:  BP (!) 110/50   Pulse 63   Temp 97.6 F (36.4 C) (Temporal)   Ht 6\' 6"  (1.981 m)   Wt (!) 342 lb 12.8 oz (155.5 kg)   BMI 39.61 kg/m    GEN: Well nourished, well developed, in no acute distress  HEENT: normal  Neck: no JVD, carotid bruits, or masses Cardiac: RRR; no murmurs, rubs, or gallops,no edema  Respiratory:  clear to auscultation bilaterally, normal work of breathing GI: soft, nontender, nondistended, + BS MS: no deformity or atrophy  Skin: warm and dry, no rash Neuro:  Alert and Oriented x 3, Strength and sensation are intact Psych: euthymic mood, full affect  Wt Readings from Last 3 Encounters:  03/03/19 (!) 342 lb 12.8 oz (155.5 kg)  02/15/19 (!) 346 lb (156.9 kg)  08/27/18 (!) 318 lb (144.2 kg)      Studies/Labs Reviewed:   EKG:  EKG is ordered today.  The ekg ordered today demonstrates sinus rhythm with first-degree AV block, poor R wave progression anterior leads.  First-degree AV block  Recent Labs: 02/15/2019: ALT 14; BUN 18; Creatinine, Ser  1.34; Hemoglobin 8.2 Repeated and verified X2.; Platelets 200.0; Potassium 4.5; Pro B Natriuretic peptide (BNP) 132.0; Sodium 136; TSH 4.19   Lipid Panel    Component Value Date/Time   CHOL 84 06/19/2018 0534   TRIG 134 06/19/2018 0534   HDL 17 (L) 06/19/2018 0534   CHOLHDL 4.9 06/19/2018 0534   VLDL 27 06/19/2018 0534   LDLCALC 40 06/19/2018 0534    Additional studies/ records that were reviewed today include:   Echo 08/23/2016 LV EF: 60% -   65% Study Conclusions  - Left ventricle: The cavity size was normal. Wall thickness was   increased in a pattern of moderate LVH. Systolic function was   normal. The estimated ejection fraction was in the range of 60%   to 65%. Wall motion was normal; there were no regional wall   motion abnormalities. Features are consistent with a pseudonormal   left ventricular filling pattern, with concomitant abnormal   relaxation and increased filling pressure (grade 2 diastolic   dysfunction).   Cath 10/17/2016  Prox LAD lesion, 10 %stenosed.  Prox RCA to Mid RCA lesion, 15 %stenosed.  The left ventricular systolic function is normal.  LV end diastolic pressure is mildly elevated.   1. No significant CAD 2. Normal LV function 3. Mildly elevated LVEDP  Plan: medical management.    ASSESSMENT:    1. Atypical chest pain   2. PAF (paroxysmal atrial fibrillation) ([place])   3. OSA (obstructive sleep apnea)   4. Essential hypertension   5. Controlled type 2 diabetes mellitus without complication, without long-term current use of insulin ([place])   6. Dyspnea, unspecified type   7. Anemia, unspecified type      PLAN:  In order of problems listed above:  1. Atypical chest pain: Symptom is quite atypical and does not occur with physical exertion.  Previous cardiac catheterization in 2018 showed minimal disease.  No further ischemic work-up recommended at this time  2. Dyspnea: I suspect it is due to combination of obesity, sedentary  lifestyle and anemia  3. Anemia: Recent hemoglobin dropped down to 8, I recommended he discuss this with his primary care provider.  He has a history of anemia with baseline hemoglobin around 8, last lab work from 6 months ago showed hemoglobin improved to 10.5.  Now hemoglobin is back down to 8 range.  4. PAF: Continue amiodarone, atenolol and Xarelto  5. Hypertension: Blood pressure stable  6. DM2: Managed by primary care provider  7. Obstructive sleep apnea: On BiPAP    Medication Adjustments/Labs and Tests Ordered: Current medicines are reviewed at length with the patient today.  Concerns regarding medicines are outlined above.  Medication changes, Labs and Tests ordered today are listed in the Patient Instructions below. Patient Instructions  Medication Instructions:  Your physician recommends that you continue on your current medications as directed. Please refer to the Current Medication list given to you today.   If you need a refill on your cardiac medications before your next appointment, please call your pharmacy.   Lab  work: NONE ordered at this time of appointment   If you have labs (blood work) drawn today and your tests are completely normal, you will receive your results only by: Marland Kitchen. MyChart Message (if you have MyChart) OR . A paper copy in the mail If you have any lab test that is abnormal or we need to change your treatment, we will call you to review the results.  Testing/Procedures: NONE ordered at this time of appointment   Follow-Up: [facility], you and your health needs are our priority.  As part of our continuing mission to provide you with exceptional heart care, we have created designated Provider Care Teams.  These Care Teams include your primary Cardiologist (physician) and Advanced Practice Providers (APPs -  Physician Assistants and Nurse Practitioners) who all work together to provide you with the care you need, when you need it. You will need a  follow up appointment in 3-4 months with Peter SwazilandJordan, MD or one of the following Advanced Practice Providers on your designated Care Team: [place]zie [name], [place] . Micah FlesherAngela Duke, PA-C  Any Other Special Instructions Will Be Listed Below (If Applicable).       Ramond DialSigned, [name], [place]PA  03/05/2019 10:02 AM    [facility] [address], [place], [place]  [address] Phone: [phone]; Fax: [phone]

## 2019-03-05 ENCOUNTER — Encounter: Payer: Self-pay | Admitting: Physician Assistant

## 2019-03-08 ENCOUNTER — Encounter: Payer: Self-pay | Admitting: Adult Health

## 2019-03-08 ENCOUNTER — Ambulatory Visit: Payer: Medicare Other | Admitting: Adult Health

## 2019-03-08 ENCOUNTER — Other Ambulatory Visit: Payer: Self-pay

## 2019-03-08 VITALS — BP 126/62 | HR 62 | Temp 98.0°F | Ht 78.0 in | Wt 347.4 lb

## 2019-03-08 DIAGNOSIS — Z23 Encounter for immunization: Secondary | ICD-10-CM | POA: Diagnosis not present

## 2019-03-08 DIAGNOSIS — J449 Chronic obstructive pulmonary disease, unspecified: Secondary | ICD-10-CM | POA: Diagnosis not present

## 2019-03-08 DIAGNOSIS — G4733 Obstructive sleep apnea (adult) (pediatric): Secondary | ICD-10-CM | POA: Diagnosis not present

## 2019-03-08 DIAGNOSIS — I5033 Acute on chronic diastolic (congestive) heart failure: Secondary | ICD-10-CM | POA: Diagnosis not present

## 2019-03-08 DIAGNOSIS — D508 Other iron deficiency anemias: Secondary | ICD-10-CM

## 2019-03-08 MED ORDER — STIOLTO RESPIMAT 2.5-2.5 MCG/ACT IN AERS: 2.0000 | INHALATION_SPRAY | Freq: Every day | RESPIRATORY_TRACT | 0 refills | Status: AC

## 2019-03-08 NOTE — Assessment & Plan Note (Signed)
Controlled on BiPAP.  Would like for him to get a new mask to see if we can decrease the number of leaks and possible events.  He seems to be tolerating well.  Plan  Patient Instructions  Continue on BiPAP at bedtime Please get new mask.  Keep up the good work Do not drive if  sleepy Work on healthy weight Continue on Stiloto 2 puffs daily,  rinse after use Please follow up with Primary Care provider for Anemia this week. (call them today )  Continue on Lasix 40mg  daily  Low salt diet  Elevate legs  Activity as tolerated.  Flu shot today .  Follow-up with Dr. Lamonte Sakai  Or Yen Wandell NP in 3 months and As needed   PFT on return (Covid pre-screen) .

## 2019-03-08 NOTE — Assessment & Plan Note (Signed)
Chronic diastolic heart failure.  Does not appear to be overtly volume overloaded.  Why does continue to go up will need to continue on diuretics however have to use cautiously as he has chronic kidney disease.  Continue to keep follow-up with cardiology  Plan  Patient Instructions  Continue on BiPAP at bedtime Please get new mask.  Keep up the good work Do not drive if  sleepy Work on healthy weight Continue on Stiloto 2 puffs daily,  rinse after use Please follow up with Primary Care provider for Anemia this week. (call them today )  Continue on Lasix 40mg  daily  Low salt diet  Elevate legs  Activity as tolerated.  Flu shot today .  Follow-up with Dr. Lamonte Sakai  Or Arrin Ishler NP in 3 months and As needed   PFT on return (Covid pre-screen) .

## 2019-03-08 NOTE — Assessment & Plan Note (Signed)
Worsening chronic anemia patient continues to have's significant symptoms with low energy and dyspnea. Refer to PCP may need referral back to hematology.  No known active bleeding. Advised patient to contact primary care provider today.  As he is on Xarelto and will need close follow-up.  Plan  Patient Instructions  Continue on BiPAP at bedtime Please get new mask.  Keep up the good work Do not drive if  sleepy Work on healthy weight Continue on Stiloto 2 puffs daily,  rinse after use Please follow up with Primary Care provider for Anemia this week. (call them today )  Continue on Lasix 40mg  daily  Low salt diet  Elevate legs  Activity as tolerated.  Flu shot today .  Follow-up with Dr. Lamonte Sakai  Or Siler Mavis NP in 3 months and As needed   PFT on return (Covid pre-screen) .

## 2019-03-08 NOTE — Patient Instructions (Addendum)
Continue on BiPAP at bedtime Please get new mask.  Keep up the good work Do not drive if  sleepy Work on healthy weight Continue on Stiloto 2 puffs daily,  rinse after use Please follow up with Primary Care provider for Anemia this week. (call them today )  Continue on Lasix 40mg  daily  Low salt diet  Elevate legs  Activity as tolerated.  Flu shot today .  Follow-up with Dr. Lamonte Sakai  Or Ashe Graybeal NP in 3 months and As needed   PFT on return (Covid pre-screen) .

## 2019-03-08 NOTE — Progress Notes (Signed)
@Patient  ID: , male    DOB: May 22, 1946, 73 y.o.   MRN: [id number]  Chief Complaint  Patient presents with  . Follow-up    Dyspnea     Referring provider: [id number]*  HPI: 73 year old male former smoker followed for COPD, obstructive sleep apnea on BiPAP Medical history significant for rheumatoid arthritis, A. fib on anticoagulation  TEST/EVENTS :  2D echo February 2018 EF 60-65% grade 2 diastolic dysfunction Left heart cath April 2018 nonobstructive coronary artery disease normal LV function  03/08/2019 Follow up : COPD  Patient returns for a 2-week follow-up.  Patient has underlying COPD and obstructive sleep apnea.  He has been having ongoing shortness of breath.  Last visit patient was changed from Anoro to 03/10/2019.  Unfortunately patient did not understand the instructions and did not know how to use his Stiolto.  Today he had patient teaching on his Stiolto device.  Return demonstration showed proper technique of use of Stiolto.  Lab work revealed worsening chronic anemia.  Hemoglobin declined from 10.5-8.2.  He has no known active bleeding.  Patient has known chronic anemia.  Has been seen by hematology with previous iron infusions.  Patient is on chronic Xarelto. Chronic kidney disease with stable with serum creatinine at 1.34. No recent PFTs are on file.  Patient denies any significant cough or wheezing. He does have chronic diastolic heart failure.  Is on Lasix 40 mg.  Last visit Lasix was increased for 3 days.  Patient says it does help with his lower extremity swelling.  However weight continues to slowly trend up.  Patient says he is very sedentary.  Says he just does not have any energy to do much of anything. Chest x-ray was clear last visit  Patient is on nocturnal BiPAP for sleep apnea.  Unload shows excellent compliance with average usage around 6 hours.  Patient is on IPAP 18, EPAP 14, pressure 44 cm.  AHI 9.9.  Patient does have significant mask  leaks.  Patient says he is due for a new mask.  Patient says he does feel rested without significant daytime sleepiness.  Although has low energy.   Allergies  Allergen Reactions  . Other Rash    Silk tape     Immunization History  Administered Date(s) Administered  . Influenza Split 07/09/2012  . Influenza Whole 10/21/2011  . Influenza, High Dose Seasonal PF 06/12/2015, 06/12/2016, 04/08/2017, 04/01/2018  . Influenza,inj,Quad PF,6+ Mos 07/09/2012, 04/13/2013, 04/07/2014, 06/12/2015, 06/12/2016  . Influenza-Unspecified 04/13/2013, 04/07/2014, 06/12/2015  . Pneumococcal Conjugate-13 04/08/2016  . Pneumococcal Polysaccharide-23 06/27/2011  . Tdap 05/27/2011    Past Medical History:  Diagnosis Date  . Bilateral lower extremity edema    per pt wears support hose  . Chronic anticoagulation    xarelto  (followed by dr 05/29/2011)  . Chronic chest pain   . Chronic lower back pain   . Chronic venous insufficiency   . COPD with emphysema (HCC)   . DOE (dyspnea on exertion)   . GERD (gastroesophageal reflux disease)   . Headache    "couple times/week" (06/17/2018)  . Hiatal hernia   . History of gout    per pt last flare-up 2013  approx.  2014 History of kidney stones   . HTN (hypertension)   . Lower urinary tract symptoms (LUTS)    urologist-- dr Marland Kitchen  . OSA on CPAP   . PAF (paroxysmal atrial fibrillation) [facility])    cardiologist-- dr [facility]  . Peripheral neuropathy   .  Pneumonia   . Rheumatoid arthritis (HCC)    followed by pcp  . Type II diabetes mellitus (HCC)    followed by pcp  . Wears dentures    full upper;  lower partial  . Wears glasses     Tobacco History: Social History   Tobacco Use  Smoking Status Former Smoker  . Packs/day: 2.00  . Years: 30.00  . Pack years: 60.00  . Types: Cigarettes  . Quit date: 03/14/1996  . Years since quitting: 22.9  Smokeless Tobacco Never Used   Counseling given: Not Answered   Outpatient Medications Prior to Visit   Medication Sig Dispense Refill  . acetaminophen (TYLENOL) 500 MG tablet Take 1,000-1,500 mg by mouth daily as needed for moderate pain or headache.    Marland Kitchen. amiodarone (PACERONE) 200 MG tablet TAKE 1 TABLET BY MOUTH  DAILY 90 tablet 3  . ANORO ELLIPTA 62.5-25 MCG/INH AEPB INHALE ONE DOSE BY MOUTH DAILY 60 each 3  . atenolol (TENORMIN) 50 MG tablet TAKE ONE TABLET BY MOUTH TWICE A DAY 180 tablet 1  . benzonatate (TESSALON PERLES) 100 MG capsule Take 1 capsule (100 mg total) by mouth every 6 (six) hours as needed for cough. 30 capsule 0  . diltiazem (CARDIZEM CD) 180 MG 24 hr capsule TAKE 1 CAPSULE BY MOUTH  DAILY 90 capsule 3  . furosemide (LASIX) 40 MG tablet TAKE 1 TABLET BY MOUTH  DAILY 90 tablet 3  . guaiFENesin (MUCINEX) 600 MG 12 hr tablet Take 1 tablet (600 mg total) by mouth 2 (two) times daily as needed for to loosen phlegm. 30 tablet 0  . HYDROcodone-acetaminophen (NORCO) 5-325 MG tablet Take 1 tablet by mouth 2 (two) times daily as needed for moderate pain. 20 tablet 0  . metFORMIN (GLUCOPHAGE-XR) 500 MG 24 hr tablet Take 1,000 mg by mouth 2 (two) times daily.     Marland Kitchen. omeprazole (PRILOSEC) 20 MG capsule TAKE 1 CAPSULE BY MOUTH  DAILY 90 capsule 3  . potassium chloride SA (K-DUR,KLOR-CON) 20 MEQ tablet TAKE ONE TABLET BY MOUTH TWO TIMES A DAY 180 tablet 2  . PROAIR HFA 108 (90 Base) MCG/ACT inhaler INHALE TWO PUFFS BY MOUTH EVERY 4 HOURS AS NEEDED FOR WHEEZING AND  SHORTNESS OF BREATH 8.5 each 2  . rosuvastatin (CRESTOR) 5 MG tablet Take 5 mg by mouth every evening.     Marland Kitchen. spironolactone (ALDACTONE) 25 MG tablet TAKE 1 TABLET BY MOUTH  DAILY 90 tablet 3  . XARELTO 20 MG TABS tablet TAKE ONE TABLET BY MOUTH DAILY WITH SUPPER 30 tablet 3  . Tiotropium Bromide-Olodaterol (STIOLTO RESPIMAT) 2.5-2.5 MCG/ACT AERS Inhale 2 puffs into the lungs daily. (Patient not taking: Reported on 03/08/2019) 4 g 5   No facility-administered medications prior to visit.      Review of Systems:   Constitutional:    No  weight loss, night sweats,  Fevers, chills, + fatigue, or  lassitude.  HEENT:   No headaches,  Difficulty swallowing,  Tooth/dental problems, or  Sore throat,                No sneezing, itching, ear ache, nasal congestion, post nasal drip,   CV:  No chest pain,  Orthopnea, PND,+ swelling in lower extremities,  No anasarca, dizziness, palpitations, syncope.   GI  No heartburn, indigestion, abdominal pain, nausea, vomiting, diarrhea, change in bowel habits, loss of appetite, bloody stools.   Resp:  No excess mucus, no productive cough,  No non-productive cough,  No coughing up of blood.  No change in color of mucus.  No wheezing.  No chest wall deformity  Skin: no rash or lesions.  GU: no dysuria, change in color of urine, no urgency or frequency.  No flank pain, no hematuria   MS:  No joint pain or swelling.  No decreased range of motion.  No back pain.    Physical Exam  BP 126/62 (BP Location: Left Arm, Cuff Size: Large)   Pulse 62   Temp 98 F (36.7 C) (Oral)   Ht 6\' 6"  (1.981 m)   Wt (!) 347 lb 6.4 oz (157.6 kg)   SpO2 96%   BMI 40.15 kg/m   GEN: A/Ox3; pleasant , NAD, morbidly obese   HEENT:  [place]/AT, NOSE-clear, THROAT-clear, no lesions, no postnasal drip or exudate noted.  Last 2-3 MP airway  NECK:  Supple w/ fair ROM; no JVD; normal carotid impulses w/o bruits; no thyromegaly or nodules palpated; no lymphadenopathy.    RESP  Clear  P & A; w/o, wheezes/ rales/ or rhonchi. no accessory muscle use, no dullness to percussion  CARD:  RRR, no m/r/g, 1-2+ peripheral edema, pulses intact, no cyanosis or clubbing.  GI:   Soft & nt; nml bowel sounds; no organomegaly or masses detected.   Musco: Warm bil, no deformities or joint swelling noted.   Neuro: alert, no focal deficits noted.    Skin: Warm, no lesions or rashes    Lab Results:  CBC    Component Value Date/Time   WBC 11.1 (H) 02/15/2019 0946   RBC 3.38 (L) 02/15/2019 0946   HGB 8.2 Repeated and  verified X2. (L) 02/15/2019 0946   HGB 12.4 (L) 06/12/2017 1105   HCT 27.1 (L) 02/15/2019 0946   HCT 39.3 06/12/2017 1105   PLT 200.0 02/15/2019 0946   PLT 124 (L) 06/12/2017 1105   MCV 80.3 02/15/2019 0946   MCV 88.9 06/12/2017 1105   MCH 26.6 08/19/2018 1409   MCHC 30.2 02/15/2019 0946   RDW 19.4 (H) 02/15/2019 0946   RDW 18.3 (H) 06/12/2017 1105   LYMPHSABS 2.5 02/15/2019 0946   LYMPHSABS 3.3 06/12/2017 1105   MONOABS 1.6 (H) 02/15/2019 0946   MONOABS 1.9 (H) 06/12/2017 1105   EOSABS 0.4 02/15/2019 0946   EOSABS 0.6 (H) 06/12/2017 1105   BASOSABS 0.2 (H) 02/15/2019 0946   BASOSABS 0.3 (H) 06/12/2017 1105    BMET    Component Value Date/Time   NA 136 02/15/2019 0946   K 4.5 02/15/2019 0946   CL 106 02/15/2019 0946   CO2 21 02/15/2019 0946   GLUCOSE 178 (H) 02/15/2019 0946   BUN 18 02/15/2019 0946   CREATININE 1.34 02/15/2019 0946   CREATININE 1.17 10/03/2016 1037   CALCIUM 9.6 02/15/2019 0946   GFRNONAA 48 (L) 08/19/2018 1409   GFRAA 55 (L) 08/19/2018 1409    BNP    Component Value Date/Time   BNP 80.9 10/04/2010 1255    ProBNP    Component Value Date/Time   PROBNP 132.0 (H) 02/15/2019 0946    Imaging: Dg Chest 2 View  Result Date: 02/15/2019 CLINICAL DATA:  Dyspnea and COPD EXAM: CHEST - 2 VIEW COMPARISON:  08/19/2018 FINDINGS: Mild hyperinflation lungs. Lungs are well aerated and clear. Cardiac enlargement without heart failure or edema. IMPRESSION: No acute abnormality Pulmonary hyperinflation.  Cardiac enlargement. Electronically Signed   By: Franchot Gallo M.D.   On: 02/15/2019 10:16      No flowsheet data found.  No  results found for: NITRICOXIDE      Assessment & Plan:   COPD (chronic obstructive pulmonary disease) (HCC) COPD on maintenance regimen with Stiolto.  Patient education on inhaler teaching given. Will check PFTs on return. Lab work does show worsening chronic anemia.  This may be contributing to his ongoing shortness of breath  along with severe deconditioning morbid obesity, diastolic dysfunction.  Chest x-ray was clear.  Low suspicion for amiodarone toxicity as he has no significant cough.  Look at PFTs on return and consider CT chest if indicated  Plan  Patient Instructions  Continue on BiPAP at bedtime Please get new mask.  Keep up the good work Do not drive if  sleepy Work on healthy weight Continue on Stiloto 2 puffs daily,  rinse after use Please follow up with Primary Care provider for Anemia this week. (call them today )  Continue on Lasix 40mg  daily  Low salt diet  Elevate legs  Activity as tolerated.  Flu shot today .  Follow-up with [name]  Or Kimmie Berggren NP in 3 months and As needed   PFT on return (Covid pre-screen) .      OSA (obstructive sleep apnea) Controlled on BiPAP.  Would like for him to get a new mask to see if we can decrease the number of leaks and possible events.  He seems to be tolerating well.  Plan  Patient Instructions  Continue on BiPAP at bedtime Please get new mask.  Keep up the good work Do not drive if  sleepy Work on healthy weight Continue on Stiloto 2 puffs daily,  rinse after use Please follow up with Primary Care provider for Anemia this week. (call them today )  Continue on Lasix 40mg  daily  Low salt diet  Elevate legs  Activity as tolerated.  Flu shot today .  Follow-up with [name]  Or Amberlee Garvey NP in 3 months and As needed   PFT on return (Covid pre-screen) .      Acute on chronic diastolic CHF (congestive heart failure) (HCC) Chronic diastolic heart failure.  Does not appear to be overtly volume overloaded.  Why does continue to go up will need to continue on diuretics however have to use cautiously as he has chronic kidney disease.  Continue to keep follow-up with cardiology  Plan  Patient Instructions  Continue on BiPAP at bedtime Please get new mask.  Keep up the good work Do not drive if  sleepy Work on healthy weight Continue on  Stiloto 2 puffs daily,  rinse after use Please follow up with Primary Care provider for Anemia this week. (call them today )  Continue on Lasix 40mg  daily  Low salt diet  Elevate legs  Activity as tolerated.  Flu shot today .  Follow-up with [name]  Or Hayden Kihara NP in 3 months and As needed   PFT on return (Covid pre-screen) .      Anemia Worsening chronic anemia patient continues to have's significant symptoms with low energy and dyspnea. Refer to PCP may need referral back to hematology.  No known active bleeding. Advised patient to contact primary care provider today.  As he is on Xarelto and will need close follow-up.  Plan  Patient Instructions  Continue on BiPAP at bedtime Please get new mask.  Keep up the good work Do not drive if  sleepy Work on healthy weight Continue on Stiloto 2 puffs daily,  rinse after use Please follow up with Primary Care provider for  Anemia this week. (call them today )  Continue on Lasix  daily  Low salt diet  Elevate legs  Activity as tolerated.  Flu shot today .  Follow-up with Dr. Delton Coombes  Or Jaquari Reckner NP in 3 months and As needed   PFT on return (Covid pre-screen) .         Rubye Oaks, NP 03/08/2019

## 2019-03-08 NOTE — Assessment & Plan Note (Addendum)
COPD on maintenance regimen with Stiolto.  Patient education on inhaler teaching given. Will check PFTs on return. Lab work does show worsening chronic anemia.  This may be contributing to his ongoing shortness of breath along with severe deconditioning morbid obesity, diastolic dysfunction.  Chest x-ray was clear.  Low suspicion for amiodarone toxicity as he has no significant cough.  Look at PFTs on return and consider CT chest if indicated Could consider changing Atenolol going forward .   Plan  Patient Instructions  Continue on BiPAP at bedtime Please get new mask.  Keep up the good work Do not drive if  sleepy Work on healthy weight Continue on Stiloto 2 puffs daily,  rinse after use Please follow up with Primary Care provider for Anemia this week. (call them today )  Continue on Lasix 40mg  daily  Low salt diet  Elevate legs  Activity as tolerated.  Flu shot today .  Follow-up with Dr. Lamonte Sakai  Or Mckayla Mulcahey NP in 3 months and As needed   PFT on return (Covid pre-screen) .

## 2019-03-08 NOTE — Progress Notes (Signed)
Patient seen in the office today and instructed on use of Stiolto.  Patient expressed understanding and demonstrated technique. 03/08/19 Parke Poisson CMA

## 2019-03-10 ENCOUNTER — Other Ambulatory Visit: Payer: Self-pay | Admitting: Cardiology

## 2019-03-22 ENCOUNTER — Telehealth: Payer: Self-pay | Admitting: Hematology and Oncology

## 2019-03-22 NOTE — Telephone Encounter (Signed)
Scheduled appt per 9/8 sch messag e- unable to reach pt  . Left message with appt date and time   

## 2019-03-24 ENCOUNTER — Other Ambulatory Visit: Payer: Self-pay | Admitting: Hematology and Oncology

## 2019-03-24 DIAGNOSIS — D62 Acute posthemorrhagic anemia: Secondary | ICD-10-CM

## 2019-03-25 ENCOUNTER — Telehealth: Payer: Self-pay | Admitting: Hematology and Oncology

## 2019-03-25 NOTE — Telephone Encounter (Signed)
Returned patient's phone call regarding rescheduling an appointment, left patient a voicemail.

## 2019-03-30 ENCOUNTER — Telehealth: Payer: Self-pay | Admitting: Hematology and Oncology

## 2019-03-30 NOTE — Telephone Encounter (Signed)
Patient wife called in to inform that her husband will not be able to make his appointment on 09/17 due to being hospitalized. Patient will reschedule when he is able to.

## 2019-03-31 ENCOUNTER — Ambulatory Visit: Payer: Medicare Other | Admitting: Hematology and Oncology

## 2019-03-31 ENCOUNTER — Other Ambulatory Visit: Payer: Medicare Other

## 2019-05-15 DEATH — deceased

## 2019-05-31 ENCOUNTER — Telehealth: Payer: Self-pay | Admitting: Emergency Medicine

## 2019-05-31 NOTE — Telephone Encounter (Signed)
Called and spoke with pt's wife Manuela Schwartz. Manuela Schwartz stated that pt passed away last night. Routing to Dr. Lamonte Sakai as an Juluis Rainier.

## 2019-06-02 ENCOUNTER — Ambulatory Visit: Payer: Medicare Other | Admitting: Adult Health

## 2019-07-01 ENCOUNTER — Ambulatory Visit: Payer: Medicare Other | Admitting: Cardiology

## 2020-07-21 IMAGING — CR DG CHEST 2V
2 series · 2 of 2 positions shown · non-contrast
Comparison: Chest x-ray of 10/06/2016

CLINICAL DATA: Shortness of breath, left-sided chest pain,
congestion for for 5 days, recent partial amputation of the toe

EXAM:
CHEST - 2 VIEW

[chest pa]
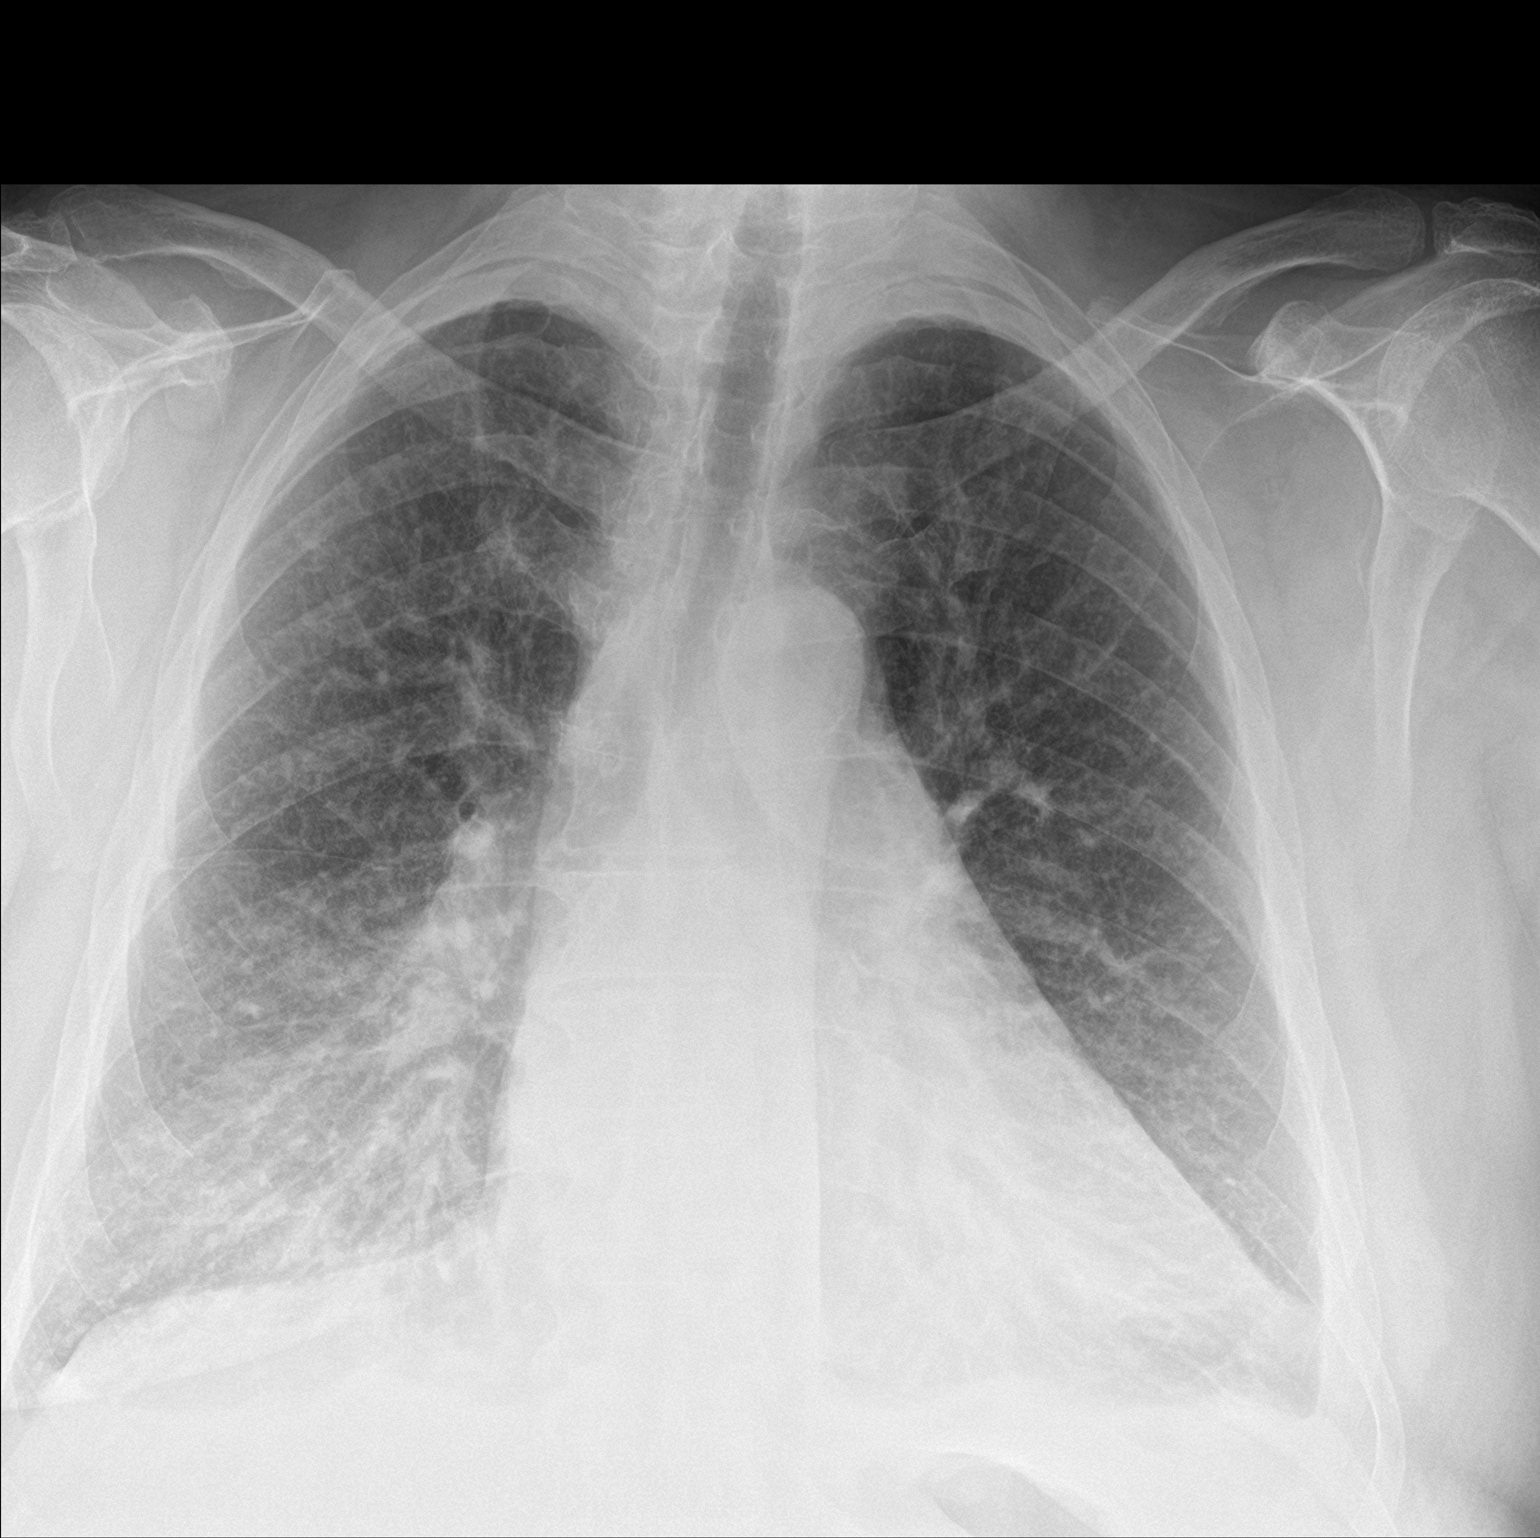

[chest lat]
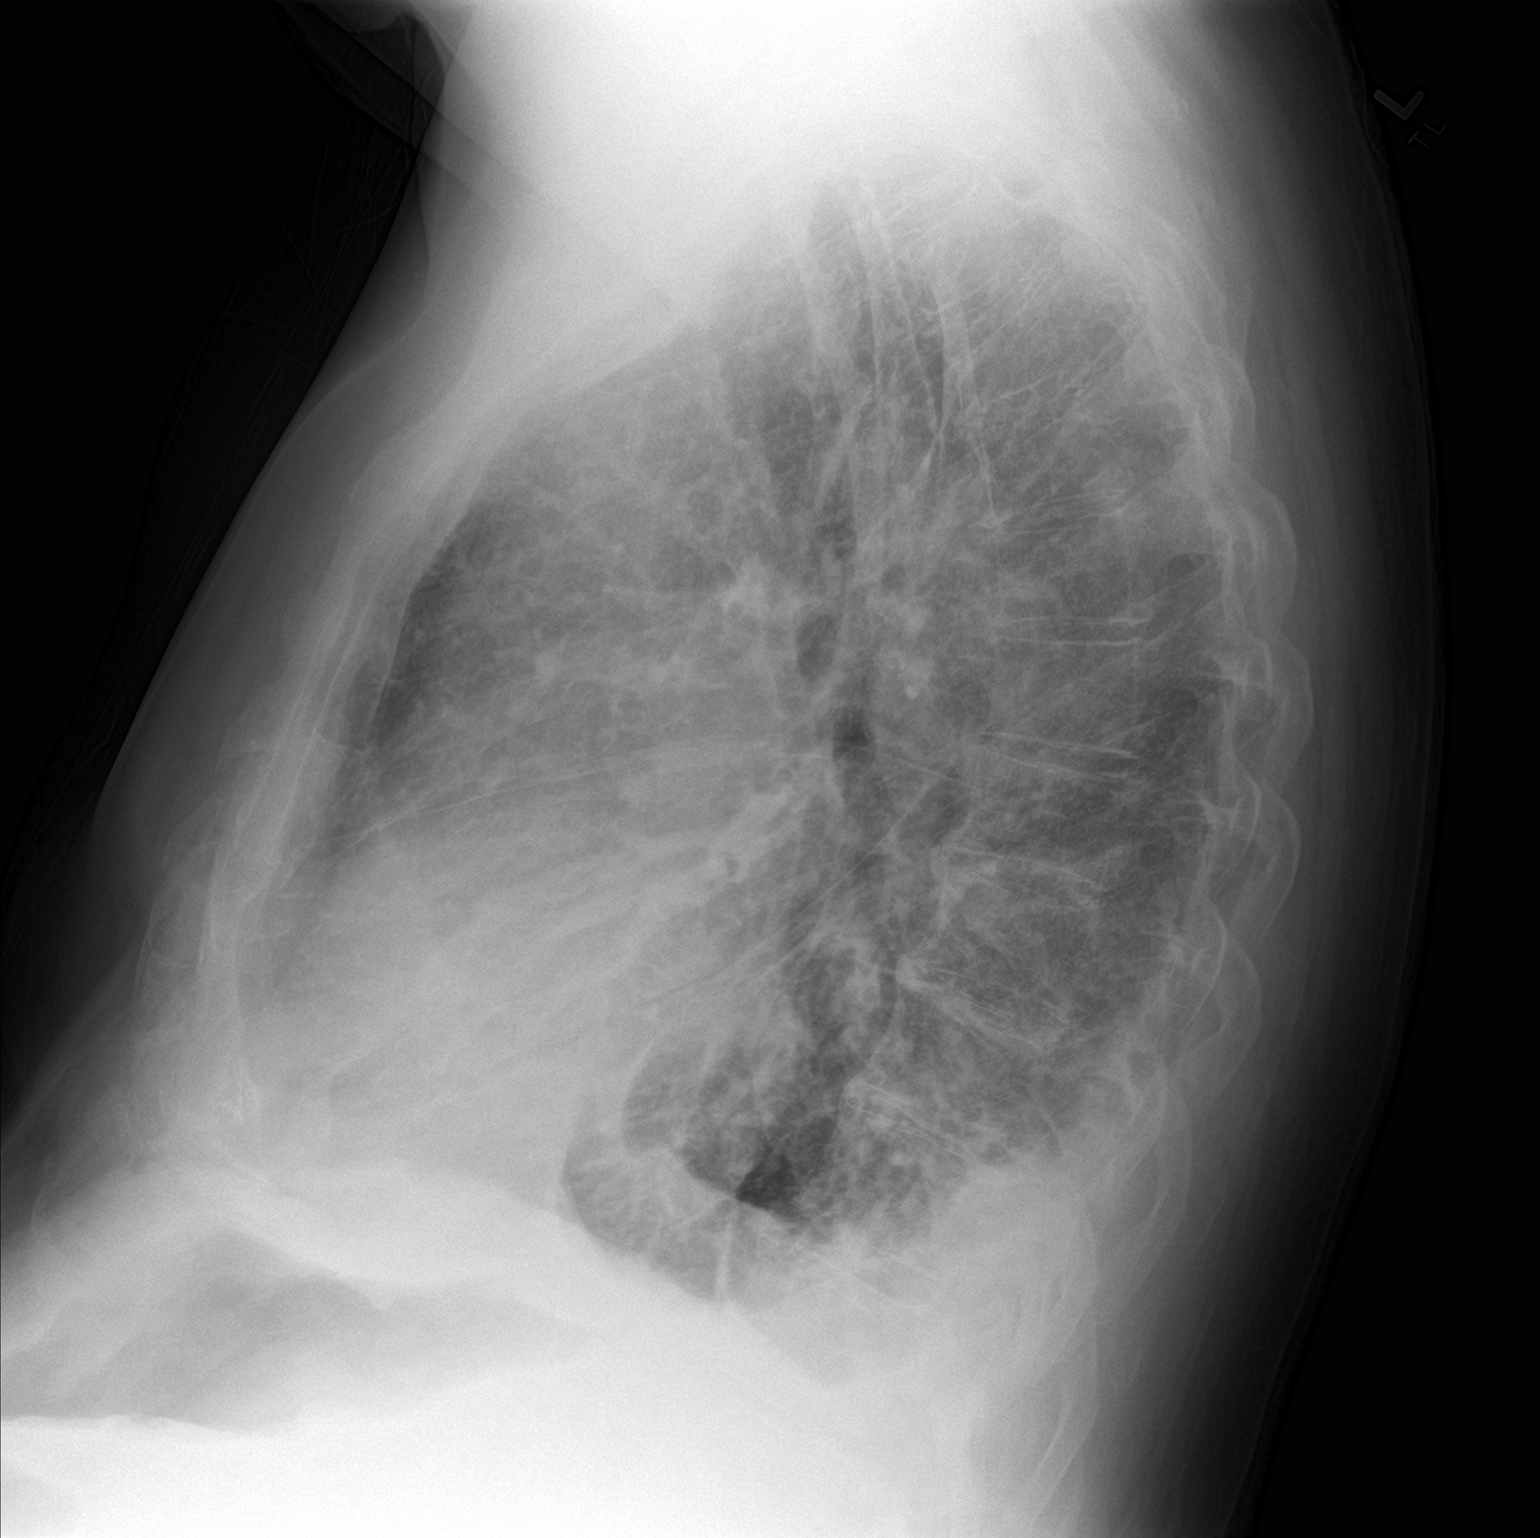

[2 of 2 positions shown; findings below may reference images not displayed]

FINDINGS: There is parenchymal opacity apparently medially at the left lung
base most consistent with left lower lobe pneumonia and probable
small left effusion. There is also cardiomegaly present and very
minimal pulmonary vascular congestion superimposed cannot be
excluded. Mediastinal and hilar contours are unremarkable. No acute
bony abnormality is seen.
IMPRESSION: 1. Pneumonia which appears to be within the left lower lobe
posteriorly with small left pleural effusion.
2. Cardiomegaly. Cannot exclude very mild pulmonary vascular
congestion.
